# Patient Record
Sex: Female | Born: 1950
Health system: Southern US, Community
[De-identification: ages and names within clinical notes are randomized; demographics above are authoritative.]

## PROBLEM LIST (undated history)

## (undated) DIAGNOSIS — K648 Other hemorrhoids: Secondary | ICD-10-CM

## (undated) DIAGNOSIS — F329 Major depressive disorder, single episode, unspecified: Secondary | ICD-10-CM

## (undated) DIAGNOSIS — G4730 Sleep apnea, unspecified: Secondary | ICD-10-CM

## (undated) DIAGNOSIS — K8020 Calculus of gallbladder without cholecystitis without obstruction: Secondary | ICD-10-CM

## (undated) DIAGNOSIS — F411 Generalized anxiety disorder: Secondary | ICD-10-CM

## (undated) DIAGNOSIS — F3289 Other specified depressive episodes: Secondary | ICD-10-CM

## (undated) DIAGNOSIS — K259 Gastric ulcer, unspecified as acute or chronic, without hemorrhage or perforation: Secondary | ICD-10-CM

## (undated) DIAGNOSIS — M1990 Unspecified osteoarthritis, unspecified site: Secondary | ICD-10-CM

## (undated) DIAGNOSIS — I10 Essential (primary) hypertension: Secondary | ICD-10-CM

## (undated) DIAGNOSIS — N870 Mild cervical dysplasia: Secondary | ICD-10-CM

## (undated) DIAGNOSIS — D649 Anemia, unspecified: Secondary | ICD-10-CM

## (undated) DIAGNOSIS — E785 Hyperlipidemia, unspecified: Secondary | ICD-10-CM

## (undated) DIAGNOSIS — E119 Type 2 diabetes mellitus without complications: Secondary | ICD-10-CM

## (undated) DIAGNOSIS — J439 Emphysema, unspecified: Secondary | ICD-10-CM

## (undated) DIAGNOSIS — D519 Vitamin B12 deficiency anemia, unspecified: Secondary | ICD-10-CM

## (undated) HISTORY — DX: Other hemorrhoids: K64.8

## (undated) HISTORY — DX: Unspecified osteoarthritis, unspecified site: M19.90

## (undated) HISTORY — PX: UPPER GASTROINTESTINAL ENDOSCOPY: SHX188

## (undated) HISTORY — DX: Generalized anxiety disorder: F41.1

## (undated) HISTORY — DX: Vitamin B12 deficiency anemia, unspecified: D51.9

## (undated) HISTORY — DX: Essential (primary) hypertension: I10

## (undated) HISTORY — DX: Mild cervical dysplasia: N87.0

## (undated) HISTORY — DX: Type 2 diabetes mellitus without complications: E11.9

## (undated) HISTORY — DX: Calculus of gallbladder without cholecystitis without obstruction: K80.20

## (undated) HISTORY — DX: Other specified depressive episodes: F32.89

## (undated) HISTORY — DX: Emphysema, unspecified: J43.9

## (undated) HISTORY — DX: Major depressive disorder, single episode, unspecified: F32.9

## (undated) HISTORY — DX: Anemia, unspecified: D64.9

## (undated) HISTORY — PX: CERVICAL CERCLAGE: SHX1329

## (undated) HISTORY — PX: HEMORRHOID SURGERY: SHX153

## (undated) HISTORY — PX: COLONOSCOPY: SHX174

## (undated) HISTORY — DX: Sleep apnea, unspecified: G47.30

## (undated) HISTORY — DX: Hyperlipidemia, unspecified: E78.5

## (undated) HISTORY — PX: OTHER SURGICAL HISTORY: SHX169

## (undated) HISTORY — DX: Gastric ulcer, unspecified as acute or chronic, without hemorrhage or perforation: K25.9

---

## 1999-04-25 ENCOUNTER — Other Ambulatory Visit: Admission: RE | Admit: 1999-04-25 | Discharge: 1999-04-25 | Payer: Self-pay | Admitting: Obstetrics and Gynecology

## 1999-09-16 ENCOUNTER — Emergency Department (HOSPITAL_COMMUNITY): Admission: EM | Admit: 1999-09-16 | Discharge: 1999-09-16 | Payer: Self-pay

## 2000-05-28 ENCOUNTER — Other Ambulatory Visit: Admission: RE | Admit: 2000-05-28 | Discharge: 2000-05-28 | Payer: Self-pay | Admitting: Gynecology

## 2000-06-17 ENCOUNTER — Ambulatory Visit (HOSPITAL_COMMUNITY): Admission: RE | Admit: 2000-06-17 | Discharge: 2000-06-17 | Payer: Self-pay | Admitting: Gynecology

## 2000-06-17 ENCOUNTER — Encounter: Payer: Self-pay | Admitting: Gynecology

## 2000-10-10 ENCOUNTER — Ambulatory Visit (HOSPITAL_COMMUNITY): Admission: RE | Admit: 2000-10-10 | Discharge: 2000-10-10 | Payer: Self-pay | Admitting: Orthopaedic Surgery

## 2001-07-26 ENCOUNTER — Encounter: Payer: Self-pay | Admitting: Emergency Medicine

## 2001-07-26 ENCOUNTER — Emergency Department (HOSPITAL_COMMUNITY): Admission: EM | Admit: 2001-07-26 | Discharge: 2001-07-26 | Payer: Self-pay | Admitting: Emergency Medicine

## 2002-02-20 ENCOUNTER — Encounter: Payer: Self-pay | Admitting: Emergency Medicine

## 2002-02-20 ENCOUNTER — Emergency Department (HOSPITAL_COMMUNITY): Admission: EM | Admit: 2002-02-20 | Discharge: 2002-02-20 | Payer: Self-pay | Admitting: Emergency Medicine

## 2002-05-12 ENCOUNTER — Other Ambulatory Visit: Admission: RE | Admit: 2002-05-12 | Discharge: 2002-05-12 | Payer: Self-pay | Admitting: Gynecology

## 2003-08-30 ENCOUNTER — Other Ambulatory Visit: Admission: RE | Admit: 2003-08-30 | Discharge: 2003-08-30 | Payer: Self-pay | Admitting: Gynecology

## 2004-03-29 ENCOUNTER — Ambulatory Visit (HOSPITAL_BASED_OUTPATIENT_CLINIC_OR_DEPARTMENT_OTHER): Admission: RE | Admit: 2004-03-29 | Discharge: 2004-03-29 | Payer: Self-pay | Admitting: Internal Medicine

## 2004-07-18 ENCOUNTER — Ambulatory Visit (HOSPITAL_BASED_OUTPATIENT_CLINIC_OR_DEPARTMENT_OTHER): Admission: RE | Admit: 2004-07-18 | Discharge: 2004-07-18 | Payer: Self-pay | Admitting: Pulmonary Disease

## 2004-12-17 ENCOUNTER — Ambulatory Visit: Payer: Self-pay | Admitting: Internal Medicine

## 2004-12-17 ENCOUNTER — Other Ambulatory Visit: Admission: RE | Admit: 2004-12-17 | Discharge: 2004-12-17 | Payer: Self-pay | Admitting: Gynecology

## 2005-01-16 ENCOUNTER — Ambulatory Visit: Payer: Self-pay | Admitting: Internal Medicine

## 2005-03-04 ENCOUNTER — Ambulatory Visit: Payer: Self-pay | Admitting: Internal Medicine

## 2005-10-30 ENCOUNTER — Ambulatory Visit: Payer: Self-pay | Admitting: Internal Medicine

## 2005-11-01 ENCOUNTER — Ambulatory Visit: Payer: Self-pay | Admitting: Cardiology

## 2005-11-18 ENCOUNTER — Ambulatory Visit: Payer: Self-pay | Admitting: Internal Medicine

## 2006-01-01 ENCOUNTER — Other Ambulatory Visit: Admission: RE | Admit: 2006-01-01 | Discharge: 2006-01-01 | Payer: Self-pay | Admitting: Gynecology

## 2006-01-24 LAB — HM MAMMOGRAPHY

## 2006-04-30 ENCOUNTER — Ambulatory Visit: Payer: Self-pay | Admitting: Internal Medicine

## 2006-06-20 ENCOUNTER — Ambulatory Visit: Payer: Self-pay | Admitting: Internal Medicine

## 2007-06-15 ENCOUNTER — Other Ambulatory Visit: Admission: RE | Admit: 2007-06-15 | Discharge: 2007-06-15 | Payer: Self-pay | Admitting: Gynecology

## 2007-10-01 DIAGNOSIS — E785 Hyperlipidemia, unspecified: Secondary | ICD-10-CM | POA: Insufficient documentation

## 2007-10-01 DIAGNOSIS — F329 Major depressive disorder, single episode, unspecified: Secondary | ICD-10-CM | POA: Insufficient documentation

## 2007-10-01 DIAGNOSIS — G4733 Obstructive sleep apnea (adult) (pediatric): Secondary | ICD-10-CM

## 2007-10-01 DIAGNOSIS — Z9889 Other specified postprocedural states: Secondary | ICD-10-CM

## 2007-10-01 DIAGNOSIS — F411 Generalized anxiety disorder: Secondary | ICD-10-CM | POA: Insufficient documentation

## 2007-10-01 DIAGNOSIS — F419 Anxiety disorder, unspecified: Secondary | ICD-10-CM | POA: Insufficient documentation

## 2007-11-02 ENCOUNTER — Ambulatory Visit: Payer: Self-pay | Admitting: Internal Medicine

## 2007-11-02 DIAGNOSIS — E119 Type 2 diabetes mellitus without complications: Secondary | ICD-10-CM | POA: Insufficient documentation

## 2007-11-03 LAB — CONVERTED CEMR LAB: Hgb A1c MFr Bld: 6.9 % — ABNORMAL HIGH (ref 4.6–6.0)

## 2007-11-06 ENCOUNTER — Ambulatory Visit: Payer: Self-pay | Admitting: Pulmonary Disease

## 2007-11-19 ENCOUNTER — Encounter: Admission: RE | Admit: 2007-11-19 | Discharge: 2007-11-19 | Payer: Self-pay | Admitting: Internal Medicine

## 2007-11-25 ENCOUNTER — Encounter: Payer: Self-pay | Admitting: Internal Medicine

## 2008-07-25 ENCOUNTER — Encounter: Payer: Self-pay | Admitting: Internal Medicine

## 2009-02-07 ENCOUNTER — Encounter: Payer: Self-pay | Admitting: Internal Medicine

## 2009-02-08 ENCOUNTER — Encounter: Admission: RE | Admit: 2009-02-08 | Discharge: 2009-02-08 | Payer: Self-pay | Admitting: Gynecology

## 2009-02-10 ENCOUNTER — Encounter: Payer: Self-pay | Admitting: Internal Medicine

## 2009-02-15 ENCOUNTER — Encounter: Admission: RE | Admit: 2009-02-15 | Discharge: 2009-02-15 | Payer: Self-pay | Admitting: Gynecology

## 2009-03-07 ENCOUNTER — Telehealth: Payer: Self-pay | Admitting: Internal Medicine

## 2009-04-04 ENCOUNTER — Ambulatory Visit: Payer: Self-pay | Admitting: Internal Medicine

## 2009-10-15 ENCOUNTER — Telehealth: Payer: Self-pay | Admitting: Family Medicine

## 2010-01-15 ENCOUNTER — Encounter: Payer: Self-pay | Admitting: Internal Medicine

## 2010-02-15 ENCOUNTER — Encounter: Admission: RE | Admit: 2010-02-15 | Discharge: 2010-02-15 | Payer: Self-pay | Admitting: Orthopaedic Surgery

## 2010-07-12 ENCOUNTER — Ambulatory Visit: Payer: Self-pay | Admitting: Internal Medicine

## 2010-07-12 DIAGNOSIS — J984 Other disorders of lung: Secondary | ICD-10-CM

## 2010-07-12 DIAGNOSIS — R918 Other nonspecific abnormal finding of lung field: Secondary | ICD-10-CM | POA: Insufficient documentation

## 2010-07-12 LAB — CONVERTED CEMR LAB
ALT: 27 units/L (ref 0–35)
AST: 32 units/L (ref 0–37)
Calcium: 9.6 mg/dL (ref 8.4–10.5)
Cholesterol: 152 mg/dL (ref 0–200)
GFR calc non Af Amer: 79.93 mL/min (ref >60)
HDL: 46.6 mg/dL (ref >39.00)
Hgb A1c MFr Bld: 7.1 % — ABNORMAL HIGH (ref 4.6–6.5)
Potassium: 3.9 meq/L (ref 3.5–5.1)
Sodium: 139 meq/L (ref 135–145)
Total Protein: 7.2 g/dL (ref 6.0–8.3)
Triglycerides: 160 mg/dL — ABNORMAL HIGH (ref 0.0–149.0)
VLDL: 32 mg/dL (ref 0.0–40.0)

## 2010-07-17 ENCOUNTER — Ambulatory Visit: Payer: Self-pay | Admitting: Internal Medicine

## 2010-07-18 ENCOUNTER — Telehealth: Payer: Self-pay | Admitting: Internal Medicine

## 2010-10-19 ENCOUNTER — Encounter: Admission: RE | Admit: 2010-10-19 | Discharge: 2010-10-19 | Payer: Self-pay | Admitting: Gynecology

## 2010-10-31 ENCOUNTER — Encounter: Admission: RE | Admit: 2010-10-31 | Discharge: 2010-10-31 | Payer: Self-pay | Admitting: Gynecology

## 2010-11-19 ENCOUNTER — Encounter: Payer: Self-pay | Admitting: Internal Medicine

## 2011-01-20 ENCOUNTER — Encounter: Payer: Self-pay | Admitting: Gynecology

## 2011-01-21 ENCOUNTER — Encounter: Payer: Self-pay | Admitting: Gynecology

## 2011-01-29 NOTE — Progress Notes (Signed)
  Phone Note Other Incoming   Summary of Call: pt requesting lab results Initial call taken by: Ami Bullins CMA,  July 18, 2010 2:37 PM  Follow-up for Phone Call        Should have received copy of office note that included labs. Results were fine except A1C 7.1% - diet managment should do. CT chest 7/19 with emphysema, stable pulmonary nodules, fatty infiltration of the liver.  Follow-up by: Jacques Navy MD,  July 18, 2010 3:01 PM  Additional Follow-up for Phone Call Additional follow up Details #1::        tried to call pt. AM is full could not leave a message. Will try back later Additional Follow-up by: Ami Bullins CMA,  July 19, 2010 9:45 AM    Additional Follow-up for Phone Call Additional follow up Details #2::    letter was mailed to pt with these lab results Follow-up by: Ami Bullins CMA,  July 23, 2010 9:12 AM

## 2011-01-29 NOTE — Letter (Signed)
Summary: Blima Ledger, OD  Blima Ledger, OD   Imported By: Lennie Odor 11/30/2010 16:22:27  _____________________________________________________________________  External Attachment:    Type:   Image     Comment:   External Document

## 2011-01-29 NOTE — Assessment & Plan Note (Signed)
Summary: needs meds, req ov/cd   Vital Signs:  Patient profile:   60 year old female Height:      67 inches Weight:      242 pounds BMI:     38.04 O2 Sat:      97 % on Room air Temp:     98.4 degrees F oral Pulse rate:   73 / minute BP sitting:   138 / 80  (left arm) Cuff size:   large  Vitals Entered By: Meredith [name] CMA (July 12, 2010 4:39 PM)  O2 Flow:  Room air  Vision Screening:      Vision Comments: Normal eye exam June 2010   Primary Care Provider:  Briawna [name]   History of Present Illness: Patient presents for medical follow-up. She was last seen April '10. She is feeling well. She continues to smoke. She continues to gain weight. She is doing well otherwise.   Preventive Screening-Counseling & Management  Alcohol-Tobacco     Smoking Status: current     Smoking Cessation Counseling: yes     Smoke Cessation Stage: precontemplative     Packs/Day: 0.75  Current Medications (verified): 1)  Vytorin 10-40 Mg  Tabs (Ezetimibe-Simvastatin) .... Once Daily 2)  Zantac  Tabs (Ranitidine Hcl) .... As Needed 3)  Alprazolam 0.5 Mg  Tabs (Alprazolam) .... One Tab Po Every  6 Hrs  As Needed 4)  Cymbalta 20 Mg Cpep (Duloxetine Hcl) .... Take 1 Tablet By Mouth Once A Day 5)  Vitamin D 2000 Unit Tabs (Cholecalciferol) .... Take 1 Tablet By Mouth Three Times A Day 6)  Fish Oil 1200 Mg Caps (Omega-3 Fatty Acids) .... Take 2 Tablet By Mouth Once A Day 7)  Aspirin 81 Mg  Tabs (Aspirin) .... Take 1 Tablet By Mouth Once A Day 8)  Calcium 600-D 600-400 Mg-Unit Tabs (Calcium Carbonate-Vitamin D) .... Take 1 Tablet By Mouth Two Times A Day  Allergies (verified): No Known Drug Allergies  Past History:  Past Medical History: Last updated: Nov 04, 2007 SLEEP APNEA (ICD-780.57) HYPERLIPIDEMIA (ICD-272.4) DEPRESSION (ICD-311) ANXIETY (ICD-300.00) Diabetes mellitus, type II  Past Surgical History: Last updated: 10/01/2007 Hemorrhoidectomy CAESAREAN SECTION, HX OF  (ICD-V39.01) ARTHROSCOPY, KNEE, HX OF (ICD-V45.89)  Family History: Last updated: November 04, 2007 father-deceased, lung cancer, Lipids, MI at 40, DM mother- deceased complications of MS Neg for colon, breast or prostate cancer.  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 BA -psych from [employer]. married 14 years-divorced. 3 daughters- twins 79, 36 work: Artist substance abuse counselor  no h/o physical or sexual abuse  Risk Factors: Smoking Status: current (07/12/2010) Packs/Day: 0.75 (07/12/2010)  Social History: Packs/Day:  0.75  Physical Exam  General:  Obese white female in no distress Head:  Normocephalic and atraumatic without obvious abnormalities. No apparent alopecia or balding. Eyes:  No corneal or conjunctival inflammation noted. EOMI. Perrla. Funduscopic exam benign, without hemorrhages, exudates or papilledema. Vision grossly normal. Ears:  External ear exam shows no significant lesions or deformities.  Otoscopic examination reveals clear canals, tympanic membranes are intact bilaterally without bulging, retraction, inflammation or discharge. Hearing is grossly normal bilaterally. Nose:  no external deformity and no external erythema.   Mouth:  Oral mucosa and oropharynx without lesions or exudates.  Teeth in good repair. Neck:  supple, full ROM, no thyromegaly, and no carotid bruits.   Chest Wall:  no deformities and no tenderness.   Breasts:  deferred to gyn Lungs:  Normal respiratory effort,  chest expands symmetrically. Lungs are clear to auscultation, no crackles or wheezes. Heart:  Normal rate and regular rhythm. S1 and S2 normal without gallop, murmur, click, rub or other extra sounds. Abdomen:  soft, non-tender, normal bowel sounds, no distention, no masses, no guarding, and no hepatomegaly.   Genitalia:  deferred to gyn Msk:  normal ROM, no joint tenderness, no joint swelling, no joint  warmth, no redness over joints, and no joint instability.   Pulses:  2+ radial, DP and PT pulses Extremities:  No clubbing, cyanosis, edema, or deformity noted with normal full range of motion of all joints.   Neurologic:  alert & oriented X3, cranial nerves II-XII intact, gait normal, and DTRs symmetrical and normal.   Skin:  patch of hyperproliferative skin medial aspect right ankle Cervical Nodes:  no anterior cervical adenopathy and no posterior cervical adenopathy.   Psych:  Oriented X3, memory intact for recent and remote, normally interactive, and good eye contact.     Impression & Recommendations:  Problem # 1:  PULMONARY NODULE (ICD-518.89) Patient is a smoker. She had a CT chest '06 that revealed several pleural based nodules. She is asymptomatic.  Plan - f/u CT chest  Orders: Radiology Referral (Radiology)  Problem # 2:  DIABETES MELLITUS, TYPE II (ICD-250.00) Diet controlled diabetic  Plan - A1C with recommendations to follow  Her updated medication list for this problem includes:    Aspirin 81 Mg Tabs (Aspirin) .Marland Kitchen... Take 1 tablet by mouth once a day  Orders: TLB-BMP (Basic Metabolic Panel-BMET) (80048-METABOL) TLB-A1C / Hgb A1C (Glycohemoglobin) (83036-A1C)  Problem # 3:  SLEEP APNEA (ICD-780.57) Patient continues to use CPAP mask but feels she may need adjustment and new mask  Plan - she is to call for appt with Dr. Shelle [name]  Problem # 4:  HYPERLIPIDEMIA (ICD-272.4) Patient has been controlled on vytorin  Plan - follow-up lab           Rx renewed  Her updated medication list for this problem includes:    Vytorin 10-40 Mg Tabs (Ezetimibe-simvastatin) ..... Once daily  Orders: TLB-Lipid Panel (80061-LIPID) TLB-Hepatic/Liver Function Pnl (80076-HEPATIC)  Problem # 5:  DEPRESSION (ICD-311) Patient still sees Meredith [name] and is doing well on cymbalta. She rarely uses  alprazolam.  Her updated medication list for this problem includes:    Alprazolam 0.5 Mg  Tabs (Alprazolam) ..... One tab po every  6 hrs  as needed    Cymbalta 20 Mg Cpep (Duloxetine hcl) .Marland Kitchen... Take 1 tablet by mouth once a day  Problem # 6:  Preventive Health Care (ICD-V70.0) Unremarkable interval history. Exam notable only for obesity and otherwise normal. Labs are pending. Last colonoscopy '06. She is current with mammography.  In summary - pleasant woman who appears medically stable. She is strongly advised to stop smoking!! 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 She will return as needed or 1 year.   Complete Medication List: 1)  Vytorin 10-40 Mg Tabs (Ezetimibe-simvastatin) .... Once daily 2)  Zantac Tabs (ranitidine Hcl)  .... As needed 3)  Alprazolam 0.5 Mg Tabs (Alprazolam) .... One tab po every  6 hrs  as needed 4)  Cymbalta 20 Mg Cpep (Duloxetine hcl) .... Take 1 tablet by mouth once a day 5)  Vitamin D 2000 Unit Tabs (Cholecalciferol) .... Take 1 tablet by mouth three times a day 6)  Fish Oil 1200 Mg Caps (Omega-3 fatty  acids) .... Take 2 tablet by mouth once a day 7)  Aspirin 81 Mg Tabs (Aspirin) .... Take 1 tablet by mouth once a day 8)  Calcium 600-d 600-400 Mg-unit Tabs (Calcium carbonate-vitamin d) .... Take 1 tablet by mouth two times a day  Patient: Meredith [name] Note: All result statuses are Final unless otherwise noted.  Tests: (1) Lipid Panel (LIPID)   Cholesterol               152 mg/dL                   4-132     ATP III Classification            Desirable:  < 200 mg/dL                    Borderline High:  200 - 239 mg/dL               High:  > = 240 mg/dL   Triglycerides        [H]  160.0 mg/dL                 4.4-010.2     Normal:  <150 mg/dL     Borderline High:  725 - 199 mg/dL   HDL                       36.64 mg/dL                 >40.34   VLDL Cholesterol          32.0 mg/dL                  7.4-25.9   LDL Cholesterol           73 mg/dL                    5-63  CHO/HDL  Ratio:  CHD Risk                             3                    Men          Women     1/2 Average Risk     3.4          3.3     Average Risk          5.0          4.4     2X Average Risk          9.6          7.1     3X Average Risk          15.0          11.0                           Tests: (2) Hepatic/Liver Function Panel (HEPATIC)   Total Bilirubin      [L]  0.2 mg/dL                   8.7-5.6   Direct Bilirubin          0.1 mg/dL  0.0-0.3   Alkaline Phosphatase      62 U/L                      39-117   AST                       32 U/L                      0-37   ALT                       27 U/L                      0-35   Total Protein             7.2 g/dL                    6.0-1.0   Albumin                   3.8 g/dL                    9.3-2.3  Tests: (3) BMP (METABOL)   Sodium                    139 mEq/L                   135-145   Potassium                 3.9 mEq/L                   3.5-5.1   Chloride                  107 mEq/L                   96-112   Carbon Dioxide            30 mEq/L                    19-32   Glucose              [H]  106 mg/dL                   55-73   BUN                       14 mg/dL                    2-20   Creatinine                0.8 mg/dL                   2.5-4.2   Calcium                   9.6 mg/dL                   7.0-62.3   GFR                       79.93 mL/min                >60  Tests: (4) Hemoglobin A1C (A1C)   Hemoglobin A1C       [  H]  7.1 %                       4.6-6.5     Glycemic Control Guidelines for People with Diabetes:     Non Diabetic:  <6%     Goal of Therapy: <7%     Additional Action Suggested:  >8%  Prescriptions: VYTORIN 10-40 MG  TABS (EZETIMIBE-SIMVASTATIN) once daily  #30 Tablet x 12   Entered and Authorized by:   Jacques Navy MD   Signed by:   Jacques Navy MD on 07/12/2010   Method used:   Electronically to        [facility] (retail)       [address]       [place], [place]  [address]       Ph: [phone]       Fax: [phone]   RxID:   [id number] VYTORIN 10-40 MG  TABS (EZETIMIBE-SIMVASTATIN) once daily  #30 Tablet x 0   Entered and Authorized by:   Jacques Navy MD   Signed by:   Jacques Navy MD on 07/12/2010   Method used:   Electronically to        [facility] (retail)       [address]       [place], [place]  [address]       Ph: [phone]       Fax: [phone]   RxID:   [id number]    Immunization History:  Influenza Immunization History:    Influenza:  historical (10/26/2009)

## 2011-03-18 ENCOUNTER — Encounter: Payer: Self-pay | Admitting: Internal Medicine

## 2011-03-18 ENCOUNTER — Ambulatory Visit (INDEPENDENT_AMBULATORY_CARE_PROVIDER_SITE_OTHER): Payer: BC Managed Care – PPO | Admitting: Internal Medicine

## 2011-03-18 DIAGNOSIS — E119 Type 2 diabetes mellitus without complications: Secondary | ICD-10-CM

## 2011-03-18 DIAGNOSIS — E669 Obesity, unspecified: Secondary | ICD-10-CM

## 2011-03-25 ENCOUNTER — Encounter: Payer: Self-pay | Admitting: Internal Medicine

## 2011-03-28 ENCOUNTER — Encounter: Payer: Self-pay | Admitting: Internal Medicine

## 2011-03-28 NOTE — Assessment & Plan Note (Signed)
Summary: discuss labs that were faxed to men/last ov july 2011/#/cd   Vital Signs:  Patient profile:   60 year old female Height:      65.5 inches Weight:      241 pounds BMI:     39.64 O2 Sat:      96 % on Room air Temp:     97.7 degrees F oral Pulse rate:   71 / minute BP sitting:   138 / 84  (left arm) Cuff size:   regular  Vitals Entered By: Bill Salinas CMA (March 18, 2011 11:27 AM)  O2 Flow:  Room air  Primary Care Provider:  Larah Kuntzman   History of Present Illness: Patient with diabetes that has been better controlled in the past with A1C 6.9, 6.9, 7.1. At her gyn office A1C 9.0%!! She does admit to gaining weight as she quit smoking. She is here to address the issues of diabetes out of control and weight management.  Current Medications (verified): 1)  Vytorin 10-40 Mg  Tabs (Ezetimibe-Simvastatin) .... Once Daily 2)  Zantac  Tabs (Ranitidine Hcl) .... As Needed 3)  Alprazolam 0.5 Mg  Tabs (Alprazolam) .... One Tab Po Every  6 Hrs  As Needed 4)  Cymbalta 20 Mg Cpep (Duloxetine Hcl) .... Take 1 Tablet By Mouth Once A Day 5)  Vitamin D 2000 Unit Tabs (Cholecalciferol) .... Take 1 Tablet By Mouth Three Times A Day 6)  Fish Oil 1200 Mg Caps (Omega-3 Fatty Acids) .... Take 2 Tablet By Mouth Once A Day 7)  Aspirin 81 Mg  Tabs (Aspirin) .... Take 1 Tablet By Mouth Once A Day 8)  Calcium 600-D 600-400 Mg-Unit Tabs (Calcium Carbonate-Vitamin D) .... Take 1 Tablet By Mouth Two Times A Day  Allergies (verified): No Known Drug Allergies  Past History:  Past Medical History: Last updated: 11/02/2007 SLEEP APNEA (ICD-780.57) HYPERLIPIDEMIA (ICD-272.4) DEPRESSION (ICD-311) ANXIETY (ICD-300.00) Diabetes mellitus, type II  Past Surgical History: Last updated: 10/01/2007 Hemorrhoidectomy CAESAREAN SECTION, HX OF (ICD-V39.01) ARTHROSCOPY, KNEE, HX OF (ICD-V45.89) PMH-FH-SH reviewed-no changes except otherwise noted, PMH-FH-SH reviewed for relevance  Review of Systems   The patient complains of weight gain.  The patient denies anorexia, fever, decreased hearing, chest pain, dyspnea on exertion, and abdominal pain.    Physical Exam  General:  overweeight white woman in no distress Head:  normocephalic and atraumatic.   Eyes:  vision grossly intact, pupils equal, and pupils round.   Neck:  supple.   Lungs:  normal respiratory effort.   Heart:  normal rate and regular rhythm.   Abdomen:  obese Neurologic:  alert & oriented X3, cranial nerves II-XII intact, and gait normal.   Skin:  turgor normal and color normal.   Psych:  Oriented X3, memory intact for recent and remote, normally interactive, and good eye contact.     Impression & Recommendations:  Problem # 1:  DIABETES MELLITUS, TYPE II (ICD-250.00) Patient presents for out of control blood sugar - A1C 9% at gyn office. Reviewed chart:                                 11/08         2/10     7/11      3/12      A1C                     6.9  6.9       7.1         9.0%  Plan - metformin 500mg  two times a day (start at once daily x 1 week then two times a day )           A1C q 3 months           Diet - no sugar, low carb  Her updated medication list for this problem includes:    Aspirin 81 Mg Tabs (Aspirin) .Marland Kitchen... Take 1 tablet by mouth once a day    Metformin Hcl 500 Mg Tabs (Metformin hcl) .Marland Kitchen... 1 by mouth two times a day  (greater than 50% visit spent on education and counselling.   Problem # 2:  OBESITY, CLASS II (ICD-278.00) discussed weight management - smart food choices; PORTION SIZE CONTROL; exercise Target - 180lbs; goal to loose 1lb per month.   Complete Medication List: 1)  Vytorin 10-40 Mg Tabs (Ezetimibe-simvastatin) .... Once daily 2)  Zantac Tabs (ranitidine Hcl)  .... As needed 3)  Alprazolam 0.5 Mg Tabs (Alprazolam) .... One tab po every  6 hrs  as needed 4)  Citalopram Hydrobromide 40 Mg Tabs (Citalopram hydrobromide) .Marland Kitchen.. 1 by mouth once daily for anxiety and  depression 5)  Vitamin D 2000 Unit Tabs (Cholecalciferol) .... Take 1 tablet by mouth three times a day 6)  Fish Oil 1200 Mg Caps (Omega-3 fatty acids) .... Take 2 tablet by mouth once a day 7)  Aspirin 81 Mg Tabs (Aspirin) .... Take 1 tablet by mouth once a day 8)  Calcium 600-d 600-400 Mg-unit Tabs (Calcium carbonate-vitamin d) .... Take 1 tablet by mouth two times a day 9)  Metformin Hcl 500 Mg Tabs (Metformin hcl) .Marland Kitchen.. 1 by mouth two times a day  Patient Instructions: 1)  Diabetes - OUT OF CONTROL with 9% A1C. Plan: no sugar, low carb; metform 500mg  once a day for 1 week then two times a day. Repeat A1C every 3 months until controlled. 2)  Weight management - smart food choices; PORTION SIZE CONTROL; regular exercise. Target weight 180lbs; Goal - to loose 1-2 lbs per month.  3)  Vitamin D deficiency - normal replacement for vitamin D deficiency is 50,000 international units weekly for 12 weeks then maintenance dosing of 1000 international units daily. If Vit D does not stay in normal range an endocrine work-up is indicated. Plan - Vit D level with next lab.  Prescriptions: METFORMIN HCL 500 MG TABS (METFORMIN HCL) 1 by mouth two times a day  #60 x 12   Entered and Authorized by:   Jacques Navy MD   Signed by:   Jacques Navy MD on 03/18/2011   Method used:   Electronically to        [facility] (retail)       [address]       [place], [place]  [address]       Ph: [phone]       Fax: [phone]   RxID:   [id number]    Orders Added: 1)  Est. Patient Level IV [24401]

## 2011-04-29 ENCOUNTER — Other Ambulatory Visit: Payer: Self-pay | Admitting: Gynecology

## 2011-04-29 DIAGNOSIS — Z09 Encounter for follow-up examination after completed treatment for conditions other than malignant neoplasm: Secondary | ICD-10-CM

## 2011-05-08 ENCOUNTER — Ambulatory Visit
Admission: RE | Admit: 2011-05-08 | Discharge: 2011-05-08 | Disposition: A | Discharge status: Home / Self Care | Payer: BC Managed Care – PPO | Source: Ambulatory Visit | Attending: Gynecology | Admitting: Gynecology

## 2011-05-08 ENCOUNTER — Other Ambulatory Visit: Payer: Self-pay | Admitting: Gynecology

## 2011-05-08 DIAGNOSIS — Z09 Encounter for follow-up examination after completed treatment for conditions other than malignant neoplasm: Secondary | ICD-10-CM

## 2011-05-14 NOTE — Assessment & Plan Note (Signed)
[facility]                             PULMONARY OFFICE NOTE   NAME:[name], [name]                          MRN:          [id number]  DATE:11/06/2007                            DOB:          07/16/1951    HISTORY OF PRESENT ILLNESS:  Patient is a very pleasant 60 year old  white female whom I have been asked to see for obstructive sleep apnea.  I initially saw the patient back in 2005 where she was diagnosed with  severe sleep apnea with an apnea hypopnea index of 51 events per hour,  and O2 desaturation as low as 78%.  She subsequently underwent a formal  titration where she was found to have a pressure need of approximately 9  cm, but she also had large numbers of leg jerks with significant arousal  and awakening.  The thought at that time was to treat her sleep apnea  aggressively and see if she continued to have alertness issues during  the day.  If she did, then would give consideration to treatment of her  leg jerks.  The patient has been on CPAP since 2005, and has been fairly  compliant with it.  Unfortunately, she did not follow up with me at the  55-month mark, and has not done any maintenance on her CPAP machine, nor  any mask changes since 2005.  The patient typically goes to bed between  12 and 1, and gets up between 6:30 and 8 to start her day.  She is not  rested upon arising.  The patient works as an Programme researcher, broadcasting/film/video, and  states she does have some daytime sleepiness during interviews, but no  frank dosing.  She will also fall asleep easily with reading or watching  TV in the evening.  She has no difficulties with driving.  She denies  any symptoms consistent with restless leg syndrome, and has had no  kicking to her knowledge during the night.  She does state that her  weight is up 60 pounds over the last 1 to 2 years.   PAST MEDICAL HISTORY:  Significant for:  1. Asthma.  2. Diabetes.  3. Dyslipidemia.  4. History of sleep apnea,  as stated above.   CURRENT MEDICATIONS:  Include:  1. Vytorin 10/40 one daily.  2. Zantac p.r.n.  3. Alprazolam 0.5 mg q.6 hours p.r.n.  4. Fluconazole 100 mg p.r.n.   PATIENT HAS NO KNOWN DRUG ALLERGIES.   SOCIAL HISTORY:  She is divorced.  She has a history of smoking for 17  years.  She has quit before, but she is currently smoking 1 pack of  cigarettes per day.   FAMILY HISTORY:  Remarkable for her father having heart disease along  with lung cancer.   REVIEW OF SYSTEMS:  As per history of present illness.  Also, see  patient intake form documented in the chart.   PHYSICAL EXAMINATION:  GENERAL:  She is an obese female in no acute  distress.  Blood pressure is 138/96.  Pulse 79.  Temperature is 98.6.  She is 5  feet 5-1/2 inches tall, and weighs 245 pounds.  Her O2 saturation on  room air is 96%.  HEENT:  Pupils equal, round, and reactive to light and accommodation.  Extraocular muscles are intact.  Nares are patent without discharge.  Oropharynx does show elongation of soft palate and uvula, and sidewall  narrowing.  NECK:  Supple without JVD or lymphadenopathy.  No palpable thyromegaly.  CHEST:  Totally clear.  CARDIAC EXAM:  Reveals regular rate and rhythm.  No murmurs, rubs, or  gallops.  ABDOMEN:  Soft and nontender with good bowel sounds.  GENITAL EXAM:  Not done and not indicated.  RECTAL EXAM:  Not done and not indicated.  BREAST EXAM:  Not done and not indicated.  LOWER EXTREMITIES:  Showed no significant edema.  Pulses were intact  distally.  NEUROLOGIC:  She was alert and oriented, and has no observable motor  defects.   IMPRESSION:  History of severe obstructive sleep apnea.  The patient has  been compliant with the use of her machine, however, has not kept up  with her mask changes, machine maintenance, and has now gained  approximately 60 pounds, which makes it very likely that her pressure  needs to be much higher.  Patient also had a lot of leg jerks  during the  night on her last sleep study, but denies any history consistent with  restless leg syndrome currently.  At this point in time, I think we need  to optimize her pressure, and also get her equipment up to date.  She is  agreeable with this approach.   PLAN:  1. I have asked the patient to work aggressively on weight loss.  2. We will obtain an auto titrate device for the next 2 weeks with      download.  I will call her with the results, and also discuss      followup.  3. The patient will need a new CPAP mask, as well as filters and      hoses.     Barbaraann Share, MD,FCCP  Electronically Signed    [facility]  DD: 11/06/2007  DT: 11/06/2007  Job #: [id number]   cc:   Meredith Gess. Norins, MD

## 2011-05-17 NOTE — Procedures (Signed)
Meredith [name], Meredith [name]                 ACCOUNT NO.:  [id number]   MEDICAL RECORD NO.:  [id number]          PATIENT TYPE:  OUT   LOCATION:  SLEEP CENTER                 FACILITY:  [facility]   PHYSICIAN:  Marcelyn Bruins, M.D. [facility] DATE OF BIRTH:  [birth year]   DATE OF ADMISSION:  07/18/2004  DATE OF DISCHARGE:  07/18/2004                              NOCTURNAL POLYSOMNOGRAM   REFERRING PHYSICIAN:  Dr. Marcelyn Bruins   INDICATION FOR STUDY/HISTORY:  Hypersomnia with sleep apnea.  The patient is  undergoing CPAP titration for pressure optimization.  Epworth sleepiness  score is 12.   SLEEP ARCHITECTURE:  Total sleep time was 280 minutes with decreased slow  wave sleep and REM.  Sleep onset latency was mildly prolonged, with a  prolonged REM latency as well.   IMPRESSION/RECOMMENDATION:  1. Good control of previously diagnosed obstructive sleep apnea with 9 cm of     CPAP.  The patient used her small Resmed Swift mask from home during the     study.  2. No cardiac arrhythmias.  3. Large numbers of leg jerks with significant sleep disruption.  Clinical     correlation suggested.                                   ______________________________                                Marcelyn Bruins, M.D. [facility]     [facility]  D:  07/23/2004 10:00:06  T:  07/23/2004 12:44:33  Job:  [id number]

## 2011-06-17 ENCOUNTER — Other Ambulatory Visit: Payer: Self-pay | Admitting: Internal Medicine

## 2011-06-17 ENCOUNTER — Other Ambulatory Visit (INDEPENDENT_AMBULATORY_CARE_PROVIDER_SITE_OTHER): Payer: BC Managed Care – PPO

## 2011-06-17 DIAGNOSIS — T887XXA Unspecified adverse effect of drug or medicament, initial encounter: Secondary | ICD-10-CM

## 2011-06-17 DIAGNOSIS — Z79899 Other long term (current) drug therapy: Secondary | ICD-10-CM

## 2011-06-17 DIAGNOSIS — E119 Type 2 diabetes mellitus without complications: Secondary | ICD-10-CM

## 2011-06-17 DIAGNOSIS — E669 Obesity, unspecified: Secondary | ICD-10-CM

## 2011-06-17 DIAGNOSIS — E559 Vitamin D deficiency, unspecified: Secondary | ICD-10-CM

## 2011-06-17 LAB — HEMOGLOBIN A1C: Hgb A1c MFr Bld: 6.8 % — ABNORMAL HIGH (ref 4.6–6.5)

## 2011-06-18 ENCOUNTER — Encounter: Payer: Self-pay | Admitting: Internal Medicine

## 2011-06-19 ENCOUNTER — Ambulatory Visit (INDEPENDENT_AMBULATORY_CARE_PROVIDER_SITE_OTHER): Payer: BC Managed Care – PPO | Admitting: Internal Medicine

## 2011-06-19 VITALS — BP 138/78 | HR 61 | Temp 97.9°F | Wt 226.0 lb

## 2011-06-19 DIAGNOSIS — E559 Vitamin D deficiency, unspecified: Secondary | ICD-10-CM

## 2011-06-19 DIAGNOSIS — E119 Type 2 diabetes mellitus without complications: Secondary | ICD-10-CM

## 2011-06-19 DIAGNOSIS — E669 Obesity, unspecified: Secondary | ICD-10-CM

## 2011-06-19 MED ORDER — ALPRAZOLAM 0.5 MG PO TABS: 0.5000 mg | ORAL_TABLET | Freq: Four times a day (QID) | ORAL | Status: DC | PRN

## 2011-06-19 NOTE — Patient Instructions (Signed)
Diabetes -  Lab Results  Component Value Date   HGBA1C 6.8* 06/17/2011   HGBA1C 7.1* 07/12/2010   HGBA1C 6.9* 11/02/2007   Lab Results  Component Value Date   LDLCALC 73 07/12/2010   CREATININE 0.8 07/12/2010   In addition to our labs there was the A1C from Dr. Nicholas Lose in April '12 of 9.0%. You are now back to within goal for treatment of diabetes - an A1C of 7% or less. Plan is to continue the metformin, diet and exercise. Recommend repeat A1C in 6 months.   Vitamin D deficiency - laab with Vit D of 60, very robust with normal range being 32+. IOM recommendation for maintenance is to take [phone] [phone] daily of vitamin D. We can recheck in 6 months and if the Vit D drops to below normal you will need a endocrinology consult as to whether there is any underlying reason for not maintaining a normal vit D level.   Smoking Cessation - GOOD JOB!!!!!!!!

## 2011-06-20 DIAGNOSIS — E559 Vitamin D deficiency, unspecified: Secondary | ICD-10-CM | POA: Insufficient documentation

## 2011-06-20 NOTE — Progress Notes (Signed)
  Subjective:    Patient ID: Meredith [name], female    DOB: 07-17-1951, 60 y.o.   MRN: [id number]  HPI Meredith [name] presents for follow-up of Vit D deficiency and diabetes. She has been feeling well. She has lost 20 lbs!! She has been adherent to medical regimen and diet.  Past Medical History  Diagnosis Date  . Unspecified sleep apnea   . Other and unspecified hyperlipidemia   . Depressive disorder, not elsewhere classified   . Anxiety state, unspecified    Past Surgical History  Procedure Date  . Hemorrhoid surgery   . Cesarean section   . Knee arthroscopy    Family History  Problem Relation Age of Onset  . Heart attack Father 24  . Diabetes Father   . Cancer Father     lung   History   Social History  . Marital Status: Divorced    Spouse Name: N/A    Number of Children: N/A  . Years of Education: N/A   Occupational History  . Not on file.   Social History Main Topics  . Smoking status: Not on file  . Smokeless tobacco: Not on file  . Alcohol Use:   . Drug Use:   . Sexually Active:    Other Topics Concern  . 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   HENT: Negative.   Eyes: Negative.   Respiratory: Negative.   Cardiovascular: Negative.   Gastrointestinal: Negative.   Musculoskeletal: Negative.   Neurological: Negative.        Objective:   Physical Exam Vitals noted Gen'l - overweight white woman in nod distress HEENT - normal Pulmonary - normal respirations Cor- RRR       Assessment & Plan:

## 2011-06-20 NOTE — Assessment & Plan Note (Signed)
She has lost 20 lbs in 3 months!   Plan - cointinued weight loss at a rate of 1-2 lbs per month.

## 2011-06-20 NOTE — Assessment & Plan Note (Signed)
Lab Results  Component Value Date   HGBA1C 6.8* 06/17/2011   HGBA1C 7.1* 07/12/2010   HGBA1C 6.9* 11/02/2007   Lab Results  Component Value Date   LDLCALC 73 07/12/2010   CREATININE 0.8 07/12/2010   Reviewed chart - she reportedly had an A1C = 9% at Dr. Johnn Hai office and was started on metformin in April '12. She has done well.  Plan - continue diet and metformin

## 2011-06-20 NOTE — Assessment & Plan Note (Signed)
Vitamin D level is 60 - robust. She has been taking calw/D = 800 iu daily plus 4000 iu as supplement. Her level is good. Discussed current recommendation IOM: routine replacement for insufficiency or deficiency than maintenance.   Plan - continue Vit D at 800 iu daily           Repeat Vit d in 6 months - if the level drops to abnormal value will refer for endocrinology evaluation.

## 2011-07-13 ENCOUNTER — Other Ambulatory Visit: Payer: Self-pay | Admitting: Internal Medicine

## 2011-09-04 ENCOUNTER — Other Ambulatory Visit: Payer: Self-pay | Admitting: Gynecology

## 2011-09-04 DIAGNOSIS — Z1231 Encounter for screening mammogram for malignant neoplasm of breast: Secondary | ICD-10-CM

## 2011-10-23 ENCOUNTER — Ambulatory Visit
Admission: RE | Admit: 2011-10-23 | Discharge: 2011-10-23 | Disposition: A | Discharge status: Home / Self Care | Payer: BC Managed Care – PPO | Source: Ambulatory Visit | Attending: Gynecology | Admitting: Gynecology

## 2011-10-23 DIAGNOSIS — Z1231 Encounter for screening mammogram for malignant neoplasm of breast: Secondary | ICD-10-CM

## 2012-03-18 ENCOUNTER — Other Ambulatory Visit: Payer: Self-pay | Admitting: *Deleted

## 2012-03-18 MED ORDER — EZETIMIBE-SIMVASTATIN 10-40 MG PO TABS: 1.0000 | ORAL_TABLET | Freq: Every day | ORAL | Status: DC

## 2012-03-30 ENCOUNTER — Telehealth: Payer: Self-pay | Admitting: Internal Medicine

## 2012-03-30 MED ORDER — METFORMIN HCL 500 MG PO TABS: 500.0000 mg | ORAL_TABLET | Freq: Two times a day (BID) | ORAL | Status: DC

## 2012-03-30 NOTE — Telephone Encounter (Signed)
Done

## 2012-03-30 NOTE — Telephone Encounter (Signed)
Pt requesting new prescription be sent in for metformin--[facility]--pt ph# [phone]/cell

## 2012-08-05 ENCOUNTER — Other Ambulatory Visit: Payer: Self-pay

## 2012-08-05 ENCOUNTER — Other Ambulatory Visit: Payer: Self-pay | Admitting: *Deleted

## 2012-08-05 MED ORDER — METFORMIN HCL 500 MG PO TABS: 500.0000 mg | ORAL_TABLET | Freq: Two times a day (BID) | ORAL | Status: DC

## 2012-09-04 ENCOUNTER — Other Ambulatory Visit: Payer: Self-pay | Admitting: *Deleted

## 2012-09-04 ENCOUNTER — Other Ambulatory Visit: Payer: Self-pay

## 2012-09-04 MED ORDER — METFORMIN HCL 500 MG PO TABS: 500.0000 mg | ORAL_TABLET | Freq: Two times a day (BID) | ORAL | Status: DC

## 2012-09-16 ENCOUNTER — Ambulatory Visit: Payer: BC Managed Care – PPO | Admitting: Internal Medicine

## 2012-09-23 ENCOUNTER — Other Ambulatory Visit (INDEPENDENT_AMBULATORY_CARE_PROVIDER_SITE_OTHER): Payer: BC Managed Care – PPO

## 2012-09-23 ENCOUNTER — Ambulatory Visit (INDEPENDENT_AMBULATORY_CARE_PROVIDER_SITE_OTHER): Payer: BC Managed Care – PPO | Admitting: Internal Medicine

## 2012-09-23 VITALS — BP 134/80 | HR 72 | Temp 98.4°F | Resp 16 | Wt 230.0 lb

## 2012-09-23 DIAGNOSIS — F329 Major depressive disorder, single episode, unspecified: Secondary | ICD-10-CM

## 2012-09-23 DIAGNOSIS — E559 Vitamin D deficiency, unspecified: Secondary | ICD-10-CM

## 2012-09-23 DIAGNOSIS — E669 Obesity, unspecified: Secondary | ICD-10-CM

## 2012-09-23 DIAGNOSIS — Z2911 Encounter for prophylactic immunotherapy for respiratory syncytial virus (RSV): Secondary | ICD-10-CM

## 2012-09-23 DIAGNOSIS — E785 Hyperlipidemia, unspecified: Secondary | ICD-10-CM

## 2012-09-23 DIAGNOSIS — E119 Type 2 diabetes mellitus without complications: Secondary | ICD-10-CM

## 2012-09-23 DIAGNOSIS — Z23 Encounter for immunization: Secondary | ICD-10-CM

## 2012-09-23 DIAGNOSIS — Z0000 Encounter for general adult medical examination without abnormal findings: Secondary | ICD-10-CM

## 2012-09-23 DIAGNOSIS — J984 Other disorders of lung: Secondary | ICD-10-CM

## 2012-09-23 LAB — LIPID PANEL
Cholesterol: 170 mg/dL (ref 0–200)
HDL: 51 mg/dL (ref >39.00)
LDL Cholesterol: 88 mg/dL (ref 0–99)
Triglycerides: 157 mg/dL — ABNORMAL HIGH (ref 0.0–149.0)
VLDL: 31.4 mg/dL (ref 0.0–40.0)

## 2012-09-23 LAB — COMPREHENSIVE METABOLIC PANEL
AST: 22 U/L (ref 0–37)
Alkaline Phosphatase: 66 U/L (ref 39–117)
BUN: 11 mg/dL (ref 6–23)
Calcium: 9.2 mg/dL (ref 8.4–10.5)
Chloride: 104 mEq/L (ref 96–112)
Creatinine, Ser: 0.6 mg/dL (ref 0.4–1.2)

## 2012-09-23 LAB — HEPATIC FUNCTION PANEL
Alkaline Phosphatase: 66 U/L (ref 39–117)
Bilirubin, Direct: 0 mg/dL (ref 0.0–0.3)
Total Protein: 7.6 g/dL (ref 6.0–8.3)

## 2012-09-23 MED ORDER — CITALOPRAM HYDROBROMIDE 40 MG PO TABS: 40.0000 mg | ORAL_TABLET | Freq: Every day | ORAL | Status: DC

## 2012-09-23 MED ORDER — EZETIMIBE-SIMVASTATIN 10-40 MG PO TABS: 1.0000 | ORAL_TABLET | Freq: Every day | ORAL | Status: DC

## 2012-09-23 MED ORDER — METFORMIN HCL 500 MG PO TABS: 500.0000 mg | ORAL_TABLET | Freq: Two times a day (BID) | ORAL | Status: DC

## 2012-09-23 MED ORDER — ALPRAZOLAM 0.5 MG PO TABS: 0.5000 mg | ORAL_TABLET | Freq: Four times a day (QID) | ORAL | Status: DC | PRN

## 2012-09-23 NOTE — Patient Instructions (Addendum)
Diabetes  - will check A1C today. If you are at goal of 7% or less follow up in 6 months; if >7% follow-up in 3 months.  Vitamin D - take 800-1,000 iu daily for maintenance  Psych - have sent in Celexa 40 mg to resume taking.   Immunizations - Tdap and Shingles vaccine today.   Health maintenance - next colonoscopy in 2016; due for pelvic and PAP; due for mammogram.

## 2012-09-23 NOTE — Progress Notes (Signed)
Subjective:    Patient ID: Meredith [name], female    DOB: August 25, 1951, 61 y.o.   MRN: [id number]  HPI Ms. Anis presents for medical follow-up and routine exam. She was last seen in June '12 at which time she was to reduce Vitamine D to 800 iu daily but she understood me to say to stop all together. In the interval she has been taking her metformin but reports that her CBGs have been running higher than goal. She did have a orthopedic injury with torn ligaments right leg and has been in a walking cast under the care of Dr.Yates. She is overdue to see her gynecologist. She has had a mammogram and is up to date. She is current with colonoscopy. Immunizations - due for Tdap, shingles vaccine.  Past Medical History  Diagnosis Date  . Unspecified sleep apnea   . Other and unspecified hyperlipidemia   . Depressive disorder, not elsewhere classified   . Anxiety state, unspecified    Past Surgical History  Procedure Date  . Hemorrhoid surgery   . Cesarean section   . Knee arthroscopy    Family History  Problem Relation Age of Onset  . Heart attack Father 48  . Diabetes Father   . Cancer Father     lung   History   Social History  . Marital Status: Divorced    Spouse Name: N/A    Number of Children: N/A  . Years of Education: N/A   Occupational History  . Not on file.   Social History Main Topics  . Smoking status: Not on file  . Smokeless tobacco: Not on file  . Alcohol Use:   . Drug Use:   . Sexually Active:    Other Topics Concern  . 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 aspirin 81 MG tablet Take 81 mg by mouth daily.        . citalopram (CELEXA) 40 MG  tablet Take 40 mg by mouth daily.        . Omega-3 Fatty Acids (FISH OIL) 1200 MG CAPS Take 2 capsules by mouth daily.        . Ranitidine HCl (ZANTAC PO) Take by mouth as needed.        Marland Kitchen DISCONTD: ezetimibe-simvastatin (VYTORIN) 10-40 MG per tablet Take 1 tablet by mouth daily.  30 tablet  5  . DISCONTD: metFORMIN (GLUCOPHAGE) 500 MG tablet Take 1 tablet (500 mg total) by mouth 2 (two) times daily with a meal. No further refills without Appointment  60 tablet  3  . Calcium Carbonate-Vitamin D (CALCIUM 600/VITAMIN D) 600-400 MG-UNIT per tablet Take 1 tablet by mouth 2 (two) times daily.        . Cholecalciferol (VITAMIN D) 2000 UNITS tablet Take 2,000 Units by mouth 3 (three) times daily.            Review of Systems Constitutional:  Negative for fever, chills, activity change and unexpected weight change.  HEENT:  Negative for hearing loss, ear pain, congestion, neck stiffness and postnasal drip. Negative for sore throat or swallowing problems. Negative for dental complaints.   Eyes: Negative for vision loss or change in visual acuity.  Respiratory: Negative for chest tightness and wheezing. Negative for DOE.   Cardiovascular: Negative for chest pain. Positive for palpitations. No decreased exercise tolerance Gastrointestinal: No change in bowel habit. No bloating or gas. No reflux or indigestion Genitourinary: Negative for urgency, frequency, flank pain and difficulty urinating.  Musculoskeletal: Negative for myalgias, back pain, arthralgias and gait problem.  Neurological: Positive for dizziness-dysequilibrium with position change, negative for tremors, weakness and headaches.  Hematological: Negative for adenopathy.  Psychiatric/Behavioral: Negative for behavioral problems and dysphoric mood.       Objective:   Physical Exam Filed Vitals:   09/23/12 1422  BP: 134/80  Pulse: 72  Temp: 98.4 F (36.9 C)  Resp: 16   Wt Readings from Last 3 Encounters:  09/23/12 230 lb (104.327  kg)  06/19/11 226 lb (102.513 kg)  03/18/11 241 lb (109.317 kg)   Gen'l: well nourished, well developed white woman in no distress HEENT - [place]/AT, EACs/TMs normal, oropharynx with native dentition in good condition, no buccal or palatal lesions, posterior pharynx clear, mucous membranes moist. C&S clear, PERRLA, fundi - normal Neck - supple, no thyromegaly Nodes- negative submental, cervical, supraclavicular regions Chest - no deformity, no CVAT Lungs - cleat without rales, wheezes. No increased work of breathing Breast - deferred to gyn and mammography Cardiovascular - regular rate and rhythm, quiet precordium, no murmurs, rubs or gallops, 2+ radial, DP and PT pulses Abdomen - BS+ x 4, no HSM, no guarding or rebound or tenderness Pelvic - deferred to gyn Rectal - deferred to gyn Extremities - no clubbing, cyanosis, edema or deformity. Walking cast right leg Neuro - A&O x 3, CN II-XII normal, motor strength normal and equal, DTRs 2+ and symmetrical biceps, radial, and patellar tendons. Cerebellar - no tremor, no rigidity, fluid movement and normal gait. Derm - Head, neck, back, abdomen and extremities without suspicious lesions   Lab Results  Component Value Date   GLUCOSE 134* 09/23/2012   CHOL 170 09/23/2012   TRIG 157.0* 09/23/2012   HDL 51.00 09/23/2012   LDLCALC 88 09/23/2012        ALT 19 09/23/2012   AST 22 09/23/2012        NA 140 09/23/2012   K 4.1 09/23/2012   CL 104 09/23/2012   CREATININE 0.6 09/23/2012   BUN 11 09/23/2012   CO2 28 09/23/2012   TSH 1.89 09/23/2012   HGBA1C 7.3* 09/23/2012        Assessment & Plan:

## 2012-09-25 ENCOUNTER — Encounter: Payer: Self-pay | Admitting: Internal Medicine

## 2012-09-27 ENCOUNTER — Encounter: Payer: Self-pay | Admitting: Internal Medicine

## 2012-09-27 DIAGNOSIS — Z0000 Encounter for general adult medical examination without abnormal findings: Secondary | ICD-10-CM | POA: Insufficient documentation

## 2012-09-27 NOTE — Assessment & Plan Note (Signed)
Wt Readings from Last 3 Encounters:  09/23/12 230 lb (104.327 kg)  06/19/11 226 lb (102.513 kg)  03/18/11 241 lb (109.317 kg)   Last BMI 37.68 (normal 19-25)  Plan -  weight management: smart food choices, PORTION SIZE CONTROL, aerobic exercise (e.g. Water exercise)  Target weight 175; goal - to loose 1-2 lbs/month (5 year project)

## 2012-09-27 NOTE — Assessment & Plan Note (Signed)
Last study July '11: IMPRESSION:  1. Mild centrilobular emphysema without other explanation chronic  cough.  2. Age advanced coronary artery atherosclerosis.  3. Stable scattered lung nodules, likely subpleural lymph nodes.  Stability since 2006 is consistent with a benign etiology.  4. Fatty infiltration of the liver.

## 2012-09-27 NOTE — Assessment & Plan Note (Signed)
Excellent control with LDL better than goal of 100 or less  Plan - continue present medication and diet

## 2012-09-27 NOTE — Assessment & Plan Note (Signed)
Interval - orthopedic injury right leg w/o need for surgery, otherwise OK. Limited physical exam - OK. Current with colorectal and breast cancer screening. Due for gyn exam - she will schedule. Immunizations brought up to date but will need to return in 4 weeks for pneumonia vaccine.   In summary - a nice woman who is doing OK. She will continue her present medical regimen and return in 6 months for follow-up A1C.

## 2012-09-27 NOTE — Assessment & Plan Note (Signed)
Stable. Continues to take medications.

## 2012-09-27 NOTE — Assessment & Plan Note (Signed)
Last Vit D level June '12 - 60, normal  Plan - routine maintenance Vit D = 800-1,000 iu daily

## 2012-09-27 NOTE — Assessment & Plan Note (Signed)
A1C just above goal of 7% or less and below threshold for medication change of 8%.  Plan Continue present medication - metformin  Better dietary adherence  Follow-up lab in 6 months (ordered, no appt needed)

## 2012-09-30 ENCOUNTER — Telehealth: Payer: Self-pay | Admitting: *Deleted

## 2012-09-30 NOTE — Telephone Encounter (Signed)
ERROR IN CHARTING 

## 2012-10-12 ENCOUNTER — Other Ambulatory Visit: Payer: Self-pay | Admitting: Gynecology

## 2012-10-12 DIAGNOSIS — Z1231 Encounter for screening mammogram for malignant neoplasm of breast: Secondary | ICD-10-CM

## 2012-11-11 ENCOUNTER — Ambulatory Visit
Admission: RE | Admit: 2012-11-11 | Discharge: 2012-11-11 | Disposition: A | Discharge status: Home / Self Care | Payer: BC Managed Care – PPO | Source: Ambulatory Visit | Attending: Gynecology | Admitting: Gynecology

## 2012-11-11 ENCOUNTER — Other Ambulatory Visit: Payer: Self-pay | Admitting: Gynecology

## 2012-11-11 DIAGNOSIS — Z1231 Encounter for screening mammogram for malignant neoplasm of breast: Secondary | ICD-10-CM

## 2012-11-17 NOTE — Progress Notes (Unsigned)
Patient ID: Meredith [name], female   DOB: March 04, 1951, 61 y.o.   MRN: [id number]

## 2012-11-19 ENCOUNTER — Ambulatory Visit (INDEPENDENT_AMBULATORY_CARE_PROVIDER_SITE_OTHER): Payer: BC Managed Care – PPO

## 2012-11-19 DIAGNOSIS — Z23 Encounter for immunization: Secondary | ICD-10-CM

## 2013-01-31 ENCOUNTER — Ambulatory Visit (INDEPENDENT_AMBULATORY_CARE_PROVIDER_SITE_OTHER): Payer: BC Managed Care – PPO | Admitting: Family Medicine

## 2013-01-31 VITALS — BP 122/72 | HR 88 | Temp 98.4°F | Resp 17 | Ht 66.0 in | Wt 236.0 lb

## 2013-01-31 DIAGNOSIS — J40 Bronchitis, not specified as acute or chronic: Secondary | ICD-10-CM

## 2013-01-31 DIAGNOSIS — J111 Influenza due to unidentified influenza virus with other respiratory manifestations: Secondary | ICD-10-CM

## 2013-01-31 DIAGNOSIS — R6889 Other general symptoms and signs: Secondary | ICD-10-CM

## 2013-01-31 LAB — POCT INFLUENZA A/B
Influenza A, POC: NEGATIVE
Influenza B, POC: NEGATIVE

## 2013-01-31 MED ORDER — ALBUTEROL SULFATE HFA 108 (90 BASE) MCG/ACT IN AERS: 2.0000 | INHALATION_SPRAY | Freq: Four times a day (QID) | RESPIRATORY_TRACT | Status: DC | PRN

## 2013-01-31 MED ORDER — HYDROCODONE-HOMATROPINE 5-1.5 MG/5ML PO SYRP: 5.0000 mL | ORAL_SOLUTION | ORAL | Status: DC | PRN

## 2013-01-31 MED ORDER — CEFDINIR 300 MG PO CAPS: 300.0000 mg | ORAL_CAPSULE | Freq: Two times a day (BID) | ORAL | Status: DC

## 2013-01-31 MED ORDER — BENZONATATE 100 MG PO CAPS: ORAL_CAPSULE | ORAL | Status: DC

## 2013-01-31 NOTE — Patient Instructions (Signed)
Take the antibiotics as directed  Use the cough syrup when you're not working, and the cough pills for when you are working and do not want to be sedated.  Drink plenty of fluids  Get lots of rest  If you feel wheezy you can get the inhaler and take 2 puffs every 4-6 hours

## 2013-01-31 NOTE — Progress Notes (Signed)
Subjective: Patient works as a Pensions consultant. She has been sick for the last couple days with a bad cough. It started with a cough. Has not have much other respiratory symptoms. She does report having had the flu shot this year. She apparently gets these kinds of problems regular. She usually takes steroids and anti-biotics. She apparently started smoking 7 months ago. She has diabetes.  Objective: TMs normal. Throat clear. Neck supple without nodes. Chest is clear without rhonchi rales or wheezes.  Assessment: Cough with flulike illness  Plan: Check flu test even though she had the shot.  Results for orders placed in visit on 01/31/13  POCT INFLUENZA A/B      Component Value Range   Influenza A, POC Negative     Influenza B, POC Negative     decided to go ahead and to rule out of antibiotics.  I do not think steroids were yet indicated. I did give her inhaler to use if she gets wheezy. Advise against overly frequent use of steroids because she is diabetic.

## 2013-05-27 ENCOUNTER — Ambulatory Visit (INDEPENDENT_AMBULATORY_CARE_PROVIDER_SITE_OTHER): Payer: BC Managed Care – PPO | Admitting: Physician Assistant

## 2013-05-27 VITALS — BP 139/86 | HR 75 | Temp 98.6°F | Resp 18 | Ht 65.0 in | Wt 241.0 lb

## 2013-05-27 DIAGNOSIS — R300 Dysuria: Secondary | ICD-10-CM

## 2013-05-27 DIAGNOSIS — N390 Urinary tract infection, site not specified: Secondary | ICD-10-CM

## 2013-05-27 LAB — POCT UA - MICROSCOPIC ONLY

## 2013-05-27 LAB — POCT URINALYSIS DIPSTICK
Glucose, UA: NEGATIVE
Nitrite, UA: POSITIVE
Spec Grav, UA: 1.025
Urobilinogen, UA: 0.2

## 2013-05-27 MED ORDER — PHENAZOPYRIDINE HCL 200 MG PO TABS: 200.0000 mg | ORAL_TABLET | Freq: Three times a day (TID) | ORAL | Status: DC | PRN

## 2013-05-27 MED ORDER — CIPROFLOXACIN HCL 500 MG PO TABS: 500.0000 mg | ORAL_TABLET | Freq: Two times a day (BID) | ORAL | Status: DC

## 2013-05-27 NOTE — Progress Notes (Signed)
   [address], [place] [address]   Phone [phone]  Subjective:    Patient ID: Meredith [name], female    DOB: May 11, 1951, 62 y.o.   MRN: [id number]  HPI Pt to clinic with continued urine symptoms. She went to another clinic on May 17 and was treated with macrobid for a UTI but she never got completely better and then the symptoms got worse.  She is not having vaginal symptoms and she has not changed sexual partners.   Review of Systems  Constitutional: Negative for fever and chills.  Gastrointestinal: Positive for abdominal pain. Negative for nausea.  Genitourinary: Positive for dysuria, urgency, frequency and hematuria. Negative for vaginal discharge.  Musculoskeletal: Positive for back pain.       Objective:   Physical Exam  Vitals reviewed. Constitutional: She is oriented to person, place, and time. She appears well-developed and well-nourished.  HENT:  Head: Normocephalic and atraumatic.  Right Ear: External ear normal.  Left Ear: External ear normal.  Eyes: Conjunctivae are normal.  Cardiovascular: Normal rate, regular rhythm and normal heart sounds.   No murmur heard. Pulmonary/Chest: Effort normal and breath sounds normal.  Abdominal: Soft. There is tenderness (mild suprapubic TTP). There is no CVA tenderness.  Neurological: She is alert and oriented to person, place, and time.  Skin: Skin is warm and dry.  Psychiatric: She has a normal mood and affect. Her behavior is normal. Judgment and thought content normal.    Results for orders placed in visit on 05/27/13  POCT URINALYSIS DIPSTICK      Result Value Range   Color, UA yellow     Clarity, UA cloudy     Glucose, UA neg     Bilirubin, UA neg     Ketones, UA trace     Spec Grav, UA 1.025     Blood, UA moderate     pH, UA 5.5     Protein, UA 30     Urobilinogen, UA 0.2     Nitrite, UA positive     Leukocytes, UA moderate (2+)    POCT UA - MICROSCOPIC ONLY      Result Value Range   WBC, Ur, HPF, POC TNTC      RBC, urine, microscopic 15-20     Bacteria, U Microscopic 2+     Mucus, UA neg     Epithelial cells, urine per micros 2-4     Crystals, Ur, HPF, POC neg     Casts, Ur, LPF, POC neg     Yeast, UA neg           Assessment & Plan:  Dysuria - Plan: POCT urinalysis dipstick, POCT UA - Microscopic Only  UTI (urinary tract infection) - Plan: ciprofloxacin (CIPRO) 500 MG tablet, Urine culture, phenazopyridine (PYRIDIUM) 200 MG tablet  Pt to push fluids - will call with labs results.  Benny Lennert PA-C 05/27/2013 8:32 PM

## 2013-05-30 LAB — URINE CULTURE: Colony Count: >=100000

## 2013-08-18 ENCOUNTER — Ambulatory Visit (INDEPENDENT_AMBULATORY_CARE_PROVIDER_SITE_OTHER): Payer: BC Managed Care – PPO | Admitting: Physician Assistant

## 2013-08-18 VITALS — BP 134/82 | HR 73 | Temp 97.8°F | Resp 18 | Ht 65.5 in | Wt 241.0 lb

## 2013-08-18 DIAGNOSIS — R300 Dysuria: Secondary | ICD-10-CM

## 2013-08-18 DIAGNOSIS — R350 Frequency of micturition: Secondary | ICD-10-CM

## 2013-08-18 DIAGNOSIS — N390 Urinary tract infection, site not specified: Secondary | ICD-10-CM

## 2013-08-18 LAB — POCT UA - MICROSCOPIC ONLY

## 2013-08-18 LAB — POCT URINALYSIS DIPSTICK
Bilirubin, UA: NEGATIVE
Glucose, UA: NEGATIVE
Ketones, UA: NEGATIVE
Nitrite, UA: POSITIVE
Protein, UA: NEGATIVE
Spec Grav, UA: 1.015
Urobilinogen, UA: 0.2
pH, UA: 7.5

## 2013-08-18 MED ORDER — CIPROFLOXACIN HCL 500 MG PO TABS: 500.0000 mg | ORAL_TABLET | Freq: Two times a day (BID) | ORAL | Status: AC

## 2013-08-18 NOTE — Patient Instructions (Signed)
Get plenty of rest and drink at least 64 ounces of water daily.  I will contact you with your lab results as soon as they are available.   If you have not heard from me in 2 weeks, please contact me.  The fastest way to get your results is to register for My Chart (see the instructions on the last page of this printout).   

## 2013-08-18 NOTE — Progress Notes (Signed)
Subjective:    Patient ID: Meredith [name], female    DOB: Sep 27, 1951, 62 y.o.   MRN: [id number]  HPI This 62 y.o. female presents for evaluation of 4 days of urinary urgency, frequency, burning.  "I have a UTI." Similar symptoms in May.  No vaginal symptoms.  No back pain.  No fever or chills.  No nausea or vomiting.  Current Outpatient Prescriptions on File Prior to Visit  Medication Sig Dispense Refill  . aspirin 81 MG tablet Take 81 mg by mouth daily.        . Calcium Carbonate-Vitamin D (CALCIUM 600/VITAMIN D) 600-400 MG-UNIT per tablet Take 1 tablet by mouth 2 (two) times daily.        . Cholecalciferol (VITAMIN D) 2000 UNITS tablet Take 2,000 Units by mouth 3 (three) times daily.        . citalopram (CELEXA) 40 MG tablet Take 1 tablet (40 mg total) by mouth daily.  30 tablet  11  . ezetimibe-simvastatin (VYTORIN) 10-40 MG per tablet Take 1 tablet by mouth daily.  30 tablet  10  . metFORMIN (GLUCOPHAGE) 500 MG tablet Take 1 tablet (500 mg total) by mouth 2 (two) times daily with a meal.  60 tablet  10  . Omega-3 Fatty Acids (FISH OIL) 1200 MG CAPS Take 2 capsules by mouth daily.        . phenazopyridine (PYRIDIUM) 200 MG tablet Take 1 tablet (200 mg total) by mouth 3 (three) times daily as needed for pain.  10 tablet  0   No current facility-administered medications on file prior to visit.   No Known Allergies  Past medical history, surgical history, family history, social history and problem list reviewed.   Review of Systems As above    Objective:   Physical Exam Blood pressure 134/82, pulse 73, temperature 97.8 F (36.6 C), temperature source Oral, resp. rate 18, height 5' 5.5" (1.664 m), weight 241 lb (109.317 kg), SpO2 97.00%. Body mass index is 39.48 kg/(m^2). Well-developed, well nourished WF who is awake, alert and oriented, in NAD. HEENT: [place]/AT, sclera and conjunctiva are clear.   Neck: supple, non-tender, no lymphadenopathy, thyromegaly. Heart: RRR, no murmur Lungs:  normal effort, CTA Abdomen: normo-active bowel sounds, supple, no mass or organomegaly. Mild suprapubic tenderness. Extremities: no cyanosis, clubbing or edema. Skin: warm and dry without rash. Psychologic: good mood and appropriate affect, normal speech and behavior.  Results for orders placed in visit on 08/18/13  POCT UA - MICROSCOPIC ONLY      Result Value Range   WBC, Ur, HPF, POC 10-20     RBC, urine, microscopic 1-2     Bacteria, U Microscopic 4+     Mucus, UA neg     Epithelial cells, urine per micros 0-1     Crystals, Ur, HPF, POC neg     Casts, Ur, LPF, POC neg     Yeast, UA neg    POCT URINALYSIS DIPSTICK      Result Value Range   Color, UA yellow     Clarity, UA hazy     Glucose, UA neg     Bilirubin, UA neg     Ketones, UA neg     Spec Grav, UA 1.015     Blood, UA eng     pH, UA 7.5     Protein, UA neg     Urobilinogen, UA 0.2     Nitrite, UA positive     Leukocytes, UA large (3+)  Assessment & Plan:  Frequent urination/Burning with urination - Plan: POCT UA - Microscopic Only, POCT urinalysis dipstick, Urine culture; ciprofloxacin (CIPRO) 500 MG tablet  Fernande Bras, PA-C Physician Assistant-Certified [facility]

## 2013-08-20 LAB — URINE CULTURE

## 2013-08-23 MED ORDER — NITROFURANTOIN MONOHYD MACRO 100 MG PO CAPS: 100.0000 mg | ORAL_CAPSULE | Freq: Two times a day (BID) | ORAL | Status: AC

## 2013-08-23 NOTE — Addendum Note (Signed)
Addended by: Fernande Bras on: 08/23/2013 08:20 AM   Modules accepted: Orders

## 2013-08-24 ENCOUNTER — Telehealth: Payer: Self-pay

## 2013-08-24 NOTE — Telephone Encounter (Signed)
PATIENT STATES SHE SPOKE WITH SOMEONE AT [facility] ON Monday AND THEY CALLED IN SOME MEDICATION FOR HER THAT SHE PICKED UP TODAY. HOWEVER, SHE SAID WE ALSO CALLED IN MICROBID WHICH SHE HAS NO IDEA WHY? SHE DID NOT GET THAT ONE FROM THE PHARMACY UNTIL WE CALL HER BACK.  BEST PHONE [phone] (CELL)   PHARMACY CHOICE IS [facility] ON [address].   MBC

## 2013-08-25 NOTE — Telephone Encounter (Signed)
Patient notified via My Chart.  Meredith [name],  The bacteria growing in your urine is resistant to the medication I prescribed (Cipro), and to Bactrim(Septra). However, it is SENSITIVE to the nitrofurantoin that you've taken before. I'm going to send that into the pharmacy for  Patient advised Mychart message sent to her regarding this, she did not get the message. Left a message for her to call me back if she has questions.

## 2013-08-26 ENCOUNTER — Ambulatory Visit (INDEPENDENT_AMBULATORY_CARE_PROVIDER_SITE_OTHER): Payer: BC Managed Care – PPO | Admitting: Family Medicine

## 2013-08-26 ENCOUNTER — Ambulatory Visit: Payer: BC Managed Care – PPO

## 2013-08-26 VITALS — BP 176/98 | HR 71 | Temp 97.9°F | Resp 20 | Ht 65.5 in

## 2013-08-26 DIAGNOSIS — M25569 Pain in unspecified knee: Secondary | ICD-10-CM

## 2013-08-26 DIAGNOSIS — M79672 Pain in left foot: Secondary | ICD-10-CM

## 2013-08-26 DIAGNOSIS — M25562 Pain in left knee: Secondary | ICD-10-CM

## 2013-08-26 DIAGNOSIS — R21 Rash and other nonspecific skin eruption: Secondary | ICD-10-CM

## 2013-08-26 DIAGNOSIS — M79609 Pain in unspecified limb: Secondary | ICD-10-CM

## 2013-08-26 DIAGNOSIS — S8002XA Contusion of left knee, initial encounter: Secondary | ICD-10-CM

## 2013-08-26 DIAGNOSIS — S9032XA Contusion of left foot, initial encounter: Secondary | ICD-10-CM

## 2013-08-26 MED ORDER — HYDROCODONE-ACETAMINOPHEN 5-325 MG PO TABS: 1.0000 | ORAL_TABLET | Freq: Four times a day (QID) | ORAL | Status: DC | PRN

## 2013-08-26 MED ORDER — KETOCONAZOLE 2 % EX CREA: TOPICAL_CREAM | Freq: Two times a day (BID) | CUTANEOUS | Status: DC

## 2013-08-26 NOTE — Progress Notes (Signed)
[address]   [place], [place]  [address]   [phone]  Subjective:    Patient ID: Meredith [name], female    DOB: 03-14-1951, 62 y.o.   MRN: [id number]  HPI This 62 y.o. female presents for evaluation after fall this morning.  Tripped on step this morning going into building.  1. L knee pain: fell on knee L; had arthroscopic surgery on knee; not sure if knee is swollen.  +Anterior and medial L knee seems swollen.  Swelling distally on L leg.  Knee is very sore.    2.  L first toe pain: severely painful  Toe is swollen.  Onset after fall this morning.  No medications; no icing.  Continued work day after fall.   3. R foot rash medial aspect; present for months.  Has been applying hydrocortisone without improvement.  Very scaly and itchy at times.  Review of Systems  Constitutional: Negative for fever, chills, diaphoresis and fatigue.  Musculoskeletal: Positive for myalgias, joint swelling, arthralgias and gait problem. Negative for back pain.  Skin: Positive for color change and rash. Negative for pallor and wound.  Neurological: Negative for dizziness and light-headedness.       Objective:   Physical Exam  Nursing note and vitals reviewed. Constitutional: She is oriented to person, place, and time. She appears well-developed and well-nourished. No distress.  HENT:  Head: Normocephalic and atraumatic.  Eyes: Conjunctivae are normal. Pupils are equal, round, and reactive to light.  Cardiovascular: Intact distal pulses.   Capillary refill < 3 seconds L foot.  Musculoskeletal:       Left knee: She exhibits decreased range of motion and bony tenderness. She exhibits no swelling, no effusion, no ecchymosis, no deformity, no laceration, no erythema, normal alignment, normal patellar mobility and normal meniscus. Tenderness found. Medial joint line tenderness noted. No lateral joint line, no MCL, no LCL and no patellar tendon tenderness noted.       Left ankle: Normal.       Left foot: She  exhibits decreased range of motion, tenderness, bony tenderness and swelling. She exhibits normal capillary refill.  L first toe: +swelling diffusely, +ecchymoses present. Pain with flexion/extension.  No deformity. L foot: non-tender to palpation along metatarsals.  Neurological: She is alert and oriented to person, place, and time. No cranial nerve deficit.  Skin: Rash noted. She is not diaphoretic. There is erythema. No pallor.  +scaling rash along ankle with erythema; +defined border.  Psychiatric: She has a normal mood and affect. Her behavior is normal.       [facility] reading (PRIMARY) by  Dr. Katrinka Blazing.  L FOOT:  FIRST PHALANX DISTAL FRACTURE?   L KNEE: DEGENERATIVE/ARTHRITIC CHANGES; +SPURRING; NAD.   Assessment & Plan:  Pain in left foot - Plan: DG Foot Complete Left  Pain, knee, left - Plan: DG Knee Complete 4 Views Left  Skin rash  1. Pain L toe: New. Secondary to trauma.  Rx for hydrocodone provided. 2.  Contusion L toe:  New.  Recommend rest, ice, elevation.  RTC one week if no improvement for repeat xray.   3. Pain L knee:  New.  Secondary to fall.  Rx for hydrocodone. 4. Contusion L Knee:  New.  Recommend rest, ice, elevation.  Avoid prolonged walking or vigorous exercise for two weeks. If no improvement in two weeks, call for ortho referral. 5.  Skin rash:  New. Treat for tinea with Ketoconazole cream; to call in two weeks if no improvement.  Will then treat with topical steroid.    Meds ordered this encounter  Medications  . ketoconazole (NIZORAL) 2 % cream    Sig: Apply topically 2 (two) times daily.    Dispense:  60 g    Refill:  0  . HYDROcodone-acetaminophen (NORCO/VICODIN) 5-325 MG per tablet    Sig: Take 1 tablet by mouth every 6 (six) hours as needed for pain.    Dispense:  30 tablet    Refill:  0

## 2013-08-27 ENCOUNTER — Telehealth: Payer: Self-pay

## 2013-08-27 NOTE — Telephone Encounter (Signed)
Patient is calling because she has not received a phone call back about her x-ray report. It was not done last night and patient is calling to follow up on that.    Best number: [phone]

## 2013-08-28 NOTE — Telephone Encounter (Signed)
Spoke with XR tech this morning they called to get radiologist to read.  Can you please review?

## 2013-09-24 ENCOUNTER — Other Ambulatory Visit: Payer: Self-pay | Admitting: Internal Medicine

## 2013-10-06 ENCOUNTER — Ambulatory Visit: Payer: BC Managed Care – PPO | Admitting: Podiatry

## 2013-10-06 ENCOUNTER — Ambulatory Visit (INDEPENDENT_AMBULATORY_CARE_PROVIDER_SITE_OTHER): Payer: BC Managed Care – PPO | Admitting: Licensed Clinical Social Worker

## 2013-10-06 DIAGNOSIS — F411 Generalized anxiety disorder: Secondary | ICD-10-CM

## 2013-10-06 DIAGNOSIS — F331 Major depressive disorder, recurrent, moderate: Secondary | ICD-10-CM

## 2013-10-09 ENCOUNTER — Ambulatory Visit (INDEPENDENT_AMBULATORY_CARE_PROVIDER_SITE_OTHER): Payer: BC Managed Care – PPO

## 2013-10-09 DIAGNOSIS — Z23 Encounter for immunization: Secondary | ICD-10-CM

## 2013-10-13 ENCOUNTER — Encounter: Payer: Self-pay | Admitting: Podiatry

## 2013-10-13 ENCOUNTER — Ambulatory Visit (INDEPENDENT_AMBULATORY_CARE_PROVIDER_SITE_OTHER): Payer: BC Managed Care – PPO | Admitting: Licensed Clinical Social Worker

## 2013-10-13 ENCOUNTER — Ambulatory Visit (INDEPENDENT_AMBULATORY_CARE_PROVIDER_SITE_OTHER): Payer: BC Managed Care – PPO | Admitting: Podiatry

## 2013-10-13 ENCOUNTER — Ambulatory Visit (INDEPENDENT_AMBULATORY_CARE_PROVIDER_SITE_OTHER): Payer: BC Managed Care – PPO

## 2013-10-13 VITALS — BP 146/86 | HR 88 | Resp 12 | Ht 65.0 in | Wt 220.0 lb

## 2013-10-13 DIAGNOSIS — L259 Unspecified contact dermatitis, unspecified cause: Secondary | ICD-10-CM

## 2013-10-13 DIAGNOSIS — M79609 Pain in unspecified limb: Secondary | ICD-10-CM

## 2013-10-13 DIAGNOSIS — F411 Generalized anxiety disorder: Secondary | ICD-10-CM

## 2013-10-13 DIAGNOSIS — G8929 Other chronic pain: Secondary | ICD-10-CM

## 2013-10-13 DIAGNOSIS — F331 Major depressive disorder, recurrent, moderate: Secondary | ICD-10-CM

## 2013-10-13 DIAGNOSIS — M722 Plantar fascial fibromatosis: Secondary | ICD-10-CM

## 2013-10-13 MED ORDER — METHYLPREDNISOLONE 4 MG PO KIT: PACK | ORAL | Status: DC

## 2013-10-13 MED ORDER — TRIAMCINOLONE ACETONIDE 10 MG/ML IJ SUSP
5.0000 mg | Freq: Once | INTRAMUSCULAR | Status: AC
  Administered 2013-10-13: 5 mg via INTRA_ARTICULAR

## 2013-10-13 NOTE — Progress Notes (Signed)
N SORE L  LT FOOT HEEL D  2 MONTHS O  SLOWLY C   WORSE A   PRESSURE T   ICE

## 2013-10-14 NOTE — Progress Notes (Signed)
Subjective:     Patient ID: Meredith [name], female   DOB: 08-27-1951, 62 y.o.   MRN: [id number]  Foot Pain   patient presents stating my heel has been hurting me for 2 months. States that it has gradually intensified and is making activity difficult with the pain being most pronounced after sitting and in the morning   Review of Systems  All other systems reviewed and are negative.       Objective:   Physical Exam  Nursing note and vitals reviewed. Constitutional: She appears well-developed and well-nourished.  Cardiovascular: Intact distal pulses.   Musculoskeletal: Normal range of motion.  Neurological: She is alert.  Skin: Skin is warm.   the left heel at the medial calcaneal tubercle is inflamed and sore when pressed    Assessment:     Plantar fasciitis left acute nature    Plan:     H&P performed condition discussed. X-rays reviewed with patient at this time. Injected the left plantar fascia 3 mg Kenalog 5 mg Xylocaine Marcaine mixture and applied fascial brace reappoint one week

## 2013-10-20 ENCOUNTER — Ambulatory Visit (INDEPENDENT_AMBULATORY_CARE_PROVIDER_SITE_OTHER): Payer: BC Managed Care – PPO | Admitting: Licensed Clinical Social Worker

## 2013-10-20 DIAGNOSIS — F4323 Adjustment disorder with mixed anxiety and depressed mood: Secondary | ICD-10-CM

## 2013-10-21 ENCOUNTER — Telehealth: Payer: Self-pay

## 2013-10-21 NOTE — Telephone Encounter (Signed)
After 64 or 5 years after 1st. So she should have the next one when she is 62 y.o. Discussed with Chelle called patient to advise.

## 2013-10-21 NOTE — Telephone Encounter (Signed)
Patient is calling to ask when she is due for her next  pneumonia shot. Can someone please help her? I tried but I was not sure, I told her that someone clinical will be able to answer it. She believes she is overdue for one. Thank you!  Best; [phone]- she said if you leave a voicemail to please leave a detailed one saying if she does or does not need a shot.

## 2013-10-27 ENCOUNTER — Ambulatory Visit: Payer: BC Managed Care – PPO | Admitting: Licensed Clinical Social Worker

## 2013-10-27 ENCOUNTER — Encounter: Payer: Self-pay | Admitting: Podiatry

## 2013-10-27 ENCOUNTER — Ambulatory Visit (INDEPENDENT_AMBULATORY_CARE_PROVIDER_SITE_OTHER): Payer: BC Managed Care – PPO | Admitting: Podiatry

## 2013-10-27 VITALS — BP 170/98 | HR 78 | Resp 18 | Wt 220.0 lb

## 2013-10-27 DIAGNOSIS — M722 Plantar fascial fibromatosis: Secondary | ICD-10-CM

## 2013-10-27 MED ORDER — TRIAMCINOLONE ACETONIDE 10 MG/ML IJ SUSP
5.0000 mg | Freq: Once | INTRAMUSCULAR | Status: AC
  Administered 2013-10-27: 5 mg via INTRA_ARTICULAR

## 2013-10-27 NOTE — Progress Notes (Signed)
Subjective:     Patient ID: Meredith [name], female   DOB: Sep 08, 1951, 62 y.o.   MRN: [id number]  HPI patient states my heel is still hurting not quite as bad but still sore in the center and outside of the heel  Review of Systems     Objective:   Physical Exam  Nursing note and vitals reviewed. Constitutional: She is oriented to person, place, and time.  Cardiovascular: Intact distal pulses.   Neurological: She is oriented to person, place, and time.   pain in the center and center lateral of the heel with depression of the arch noted  bilateral     Assessment:     Plantar fasciitis left heel with structural and down    Plan:     From the lateral side injected the plantar fascia 3 mg Kenalog 5 mg Xylocaine Marcaine mixture and scanned for custom orthotic devic

## 2013-10-27 NOTE — Progress Notes (Signed)
Pt presents for follow-up for left foot plantar fasciitis.

## 2013-10-28 ENCOUNTER — Other Ambulatory Visit: Payer: Self-pay | Admitting: Internal Medicine

## 2013-11-01 ENCOUNTER — Telehealth: Payer: Self-pay | Admitting: *Deleted

## 2013-11-01 NOTE — Telephone Encounter (Signed)
Left message to bring old orthotics to the next appt with Dr Charlsie Merles, to determine if they would be helpful at this time or need a new orthotic prescription.

## 2013-11-03 ENCOUNTER — Ambulatory Visit (INDEPENDENT_AMBULATORY_CARE_PROVIDER_SITE_OTHER): Payer: BC Managed Care – PPO | Admitting: Licensed Clinical Social Worker

## 2013-11-03 DIAGNOSIS — F4323 Adjustment disorder with mixed anxiety and depressed mood: Secondary | ICD-10-CM

## 2013-11-04 ENCOUNTER — Other Ambulatory Visit: Payer: Self-pay | Admitting: Internal Medicine

## 2013-11-05 ENCOUNTER — Other Ambulatory Visit: Payer: Self-pay | Admitting: Internal Medicine

## 2013-11-05 ENCOUNTER — Encounter: Payer: Self-pay | Admitting: Internal Medicine

## 2013-11-05 ENCOUNTER — Ambulatory Visit (INDEPENDENT_AMBULATORY_CARE_PROVIDER_SITE_OTHER): Payer: BC Managed Care – PPO | Admitting: Internal Medicine

## 2013-11-05 ENCOUNTER — Other Ambulatory Visit (INDEPENDENT_AMBULATORY_CARE_PROVIDER_SITE_OTHER): Payer: BC Managed Care – PPO

## 2013-11-05 VITALS — BP 152/100 | HR 64 | Temp 96.9°F | Ht 65.0 in | Wt 244.0 lb

## 2013-11-05 DIAGNOSIS — E119 Type 2 diabetes mellitus without complications: Secondary | ICD-10-CM

## 2013-11-05 DIAGNOSIS — IMO0001 Reserved for inherently not codable concepts without codable children: Secondary | ICD-10-CM

## 2013-11-05 DIAGNOSIS — E785 Hyperlipidemia, unspecified: Secondary | ICD-10-CM

## 2013-11-05 DIAGNOSIS — I10 Essential (primary) hypertension: Secondary | ICD-10-CM

## 2013-11-05 DIAGNOSIS — F411 Generalized anxiety disorder: Secondary | ICD-10-CM

## 2013-11-05 LAB — BASIC METABOLIC PANEL
CO2: 30 mEq/L (ref 19–32)
Calcium: 9.3 mg/dL (ref 8.4–10.5)
Chloride: 104 mEq/L (ref 96–112)
Creatinine, Ser: 0.5 mg/dL (ref 0.4–1.2)
Potassium: 3.9 mEq/L (ref 3.5–5.1)
Sodium: 139 mEq/L (ref 135–145)

## 2013-11-05 LAB — HEPATIC FUNCTION PANEL
ALT: 22 U/L (ref 0–35)
Alkaline Phosphatase: 74 U/L (ref 39–117)
Bilirubin, Direct: 0.1 mg/dL (ref 0.0–0.3)
Total Bilirubin: 0.5 mg/dL (ref 0.3–1.2)
Total Protein: 7.3 g/dL (ref 6.0–8.3)

## 2013-11-05 LAB — URINALYSIS, ROUTINE W REFLEX MICROSCOPIC
Hgb urine dipstick: NEGATIVE
Ketones, ur: NEGATIVE
Specific Gravity, Urine: 1.01 (ref 1.000–1.030)
Total Protein, Urine: NEGATIVE
Urine Glucose: NEGATIVE
Urobilinogen, UA: 0.2 (ref 0.0–1.0)

## 2013-11-05 LAB — LIPID PANEL
HDL: 54.2 mg/dL (ref >39.00)
LDL Cholesterol: 84 mg/dL (ref 0–99)
Total CHOL/HDL Ratio: 3

## 2013-11-05 MED ORDER — ESCITALOPRAM OXALATE 10 MG PO TABS: 10.0000 mg | ORAL_TABLET | Freq: Every day | ORAL | Status: DC

## 2013-11-05 MED ORDER — IRBESARTAN 150 MG PO TABS: 150.0000 mg | ORAL_TABLET | Freq: Every day | ORAL | Status: DC

## 2013-11-05 MED ORDER — METFORMIN HCL 500 MG PO TABS: ORAL_TABLET | ORAL | Status: DC

## 2013-11-05 NOTE — Assessment & Plan Note (Addendum)
?   Control, for f/u lab, cont metformin, diet, wt loss efforts, exercise, consider incr OHA for a1c > 7  Note:  Total time for pt hx, exam, review of record with pt in the room, determination of diagnoses and plan for further eval and tx is > 40 min, with over 50% spent in coordination and counseling of patient

## 2013-11-05 NOTE — Patient Instructions (Signed)
Please take all new medication as prescribed - the generic avapro at 150 mg per day for Blood Pressure, and the lexapro 10 mg (instead of the celexa) Please call in 1 wk if BP still consistently more then 140/90 Please continue all other medications as before, and refills have been done if requested. - the vytorin Please go to the LAB in the Basement (turn left off the elevator) for the tests to be done today You will be contacted by phone if any changes need to be made immediately.  Otherwise, you will receive a letter about your results with an explanation, but please check with MyChart first.  Please remember to sign up for My Chart if you have not done so, as this will be important to you in the future with finding out test results, communicating by private email, and scheduling acute appointments online when needed.  Please see Dr Debby Bud in Dec 2014 as you have planned

## 2013-11-05 NOTE — Assessment & Plan Note (Signed)
Out of vytorin x 2 mo, to re-start, for baseline lipid today and prob f/u lab with f/u visit with PCP next month

## 2013-11-05 NOTE — Progress Notes (Signed)
Subjective:    Patient ID: Meredith [name], female    DOB: 11-15-1951, 62 y.o.   MRN: [id number]  HPI  Here to f/u as out of meds and Dr Debby Bud not able to see; recent BP at mult providers ([facility] and other) with BP > 140/90 such as 176.98, 177/117, 167/94.  Pt denies chest pain, increased sob or doe, wheezing, orthopnea, PND, increased LE swelling, palpitations, dizziness or syncope.   Pt denies polydipsia, polyuria, or low sugar symptoms such as weakness or confusion improved with po intake.  Pt states overall good compliance with meds, trying to follow lower cholesterol, diabetic diet, wt overall stable but little exercise however, has gained approx 15 lbs since last a1c July 2013 of just over 7.  Pt denies new neurological symptoms such as new headache, or facial or extremity weakness or numbness, but has occas HA and dizziness recent ? Assoc with elev BP.  Has also had worsening mild depressive symptoms, no suicidal ideation, or panic; has ongoing anxiety, some worse recently, all since stopping her celexa 2 mo ago when did not seem to her to be helping further, though had been on 40 mg for several yrs.  Needs refill vytorin, also out for approx 2 mo. Past Medical History  Diagnosis Date  . Unspecified sleep apnea   . Other and unspecified hyperlipidemia   . Depressive disorder, not elsewhere classified   . Anxiety state, unspecified   . Asthma   . Diabetes mellitus without complication    Past Surgical History  Procedure Laterality Date  . Hemorrhoid surgery    . Cesarean section    . Knee arthroscopy      reports that she has been smoking Cigarettes.  She has a 7 pack-year smoking history. She has never used smokeless tobacco. She reports that she drinks alcohol. She reports that she does not use illicit drugs. family history includes Cancer in her father; Diabetes in her father; Heart attack (age of onset: 20) in her father. No Known Allergies Current Outpatient Prescriptions on File Prior to  Visit  Medication Sig Dispense Refill  . aspirin 81 MG tablet Take 81 mg by mouth daily.        Marland Kitchen ketoconazole (NIZORAL) 2 % cream Apply topically 2 (two) times daily.  60 g  0  . Omega-3 Fatty Acids (FISH OIL) 1200 MG CAPS Take 2 capsules by mouth daily.        Marland Kitchen VYTORIN 10-40 MG per tablet TAKE 1 TABLET BY MOUTH DAILY  30 tablet  0   No current facility-administered medications on file prior to visit.   Review of Systems  Constitutional: Negative for unexpected weight change, or unusual diaphoresis  HENT: Negative for tinnitus.   Eyes: Negative for photophobia and visual disturbance.  Respiratory: Negative for choking and stridor.   Gastrointestinal: Negative for vomiting and blood in stool.  Genitourinary: Negative for hematuria and decreased urine volume.  Musculoskeletal: Negative for acute joint swelling Skin: Negative for color change and wound.  Neurological: Negative for tremors and numbness other than noted  Psychiatric/Behavioral: Negative for decreased concentration or  hyperactivity.       Objective:   Physical Exam BP 152/100  Pulse 64  Temp(Src) 96.9 F (36.1 C) (Oral)  Ht 5\' 5"  (1.651 m)  Wt 244 lb (110.678 kg)  BMI 40.60 kg/m2  SpO2 96%.  VS noted,  Constitutional: Pt appears well-developed and well-nourished.  HENT: Head: NCAT.  Right Ear: External ear normal.  Left  Ear: External ear normal.  Eyes: Conjunctivae and EOM are normal. Pupils are equal, round, and reactive to light.  Neck: Normal range of motion. Neck supple.  Cardiovascular: Normal rate and regular rhythm.   Pulmonary/Chest: Effort normal and breath sounds normal.  Neurological: Pt is alert. Not confused , cn 2-12 intact, motor 5/5 Skin: Skin is warm. No erythema.  Psychiatric: Pt behavior is normal. Thought content normal. + dysphoric    Assessment & Plan:

## 2013-11-05 NOTE — Assessment & Plan Note (Signed)
Ok for lexapro 10 qd, f/u with PCP next mo

## 2013-11-05 NOTE — Progress Notes (Signed)
Pre visit review using our clinic review tool, if applicable. No additional management support is needed unless otherwise documented below in the visit note. 

## 2013-11-05 NOTE — Assessment & Plan Note (Signed)
In light of DM, will add ARB  - avapro 150, f/u BP at home, call in 1-2 wks if persistent elev to consider incr avapro to 300, f/u o/w with PCP next mo as planned, for low salt, wt loss, exercise, ETOH in moderation

## 2013-11-09 ENCOUNTER — Telehealth: Payer: Self-pay

## 2013-11-09 MED ORDER — IRBESARTAN 300 MG PO TABS: 300.0000 mg | ORAL_TABLET | Freq: Every day | ORAL | Status: DC

## 2013-11-09 NOTE — Telephone Encounter (Signed)
Ok to increase to 300 qd, f/u with Dr Debby Bud as planned

## 2013-11-09 NOTE — Telephone Encounter (Signed)
The patient stated her BP is still elevated after starting the new medication.  Average of 177/10, 168/100 and 155/100.  Please advise

## 2013-11-09 NOTE — Telephone Encounter (Signed)
Called the patient left a detailed message of MD instructions. 

## 2013-11-10 ENCOUNTER — Ambulatory Visit: Payer: BC Managed Care – PPO | Admitting: Licensed Clinical Social Worker

## 2013-11-22 ENCOUNTER — Other Ambulatory Visit: Payer: Self-pay

## 2013-11-22 DIAGNOSIS — Z1231 Encounter for screening mammogram for malignant neoplasm of breast: Secondary | ICD-10-CM

## 2013-12-01 ENCOUNTER — Ambulatory Visit: Payer: BC Managed Care – PPO

## 2013-12-03 ENCOUNTER — Other Ambulatory Visit: Payer: Self-pay | Admitting: Internal Medicine

## 2013-12-03 ENCOUNTER — Telehealth: Payer: Self-pay

## 2013-12-03 MED ORDER — EZETIMIBE-SIMVASTATIN 10-40 MG PO TABS: 1.0000 | ORAL_TABLET | Freq: Every day | ORAL | Status: DC

## 2013-12-03 NOTE — Telephone Encounter (Signed)
Patient called requesting a refill on Vytorin

## 2013-12-03 NOTE — Telephone Encounter (Signed)
Patient called for a refill of Vytorin be sent to [facility] on [address]

## 2013-12-29 ENCOUNTER — Encounter: Payer: Self-pay | Admitting: Internal Medicine

## 2013-12-29 ENCOUNTER — Ambulatory Visit (INDEPENDENT_AMBULATORY_CARE_PROVIDER_SITE_OTHER): Payer: BC Managed Care – PPO | Admitting: Internal Medicine

## 2013-12-29 ENCOUNTER — Ambulatory Visit: Payer: BC Managed Care – PPO

## 2013-12-29 VITALS — BP 156/94 | HR 60 | Temp 97.8°F | Wt 244.0 lb

## 2013-12-29 DIAGNOSIS — E785 Hyperlipidemia, unspecified: Secondary | ICD-10-CM

## 2013-12-29 DIAGNOSIS — IMO0001 Reserved for inherently not codable concepts without codable children: Secondary | ICD-10-CM

## 2013-12-29 DIAGNOSIS — F329 Major depressive disorder, single episode, unspecified: Secondary | ICD-10-CM

## 2013-12-29 DIAGNOSIS — Z0000 Encounter for general adult medical examination without abnormal findings: Secondary | ICD-10-CM

## 2013-12-29 DIAGNOSIS — E1169 Type 2 diabetes mellitus with other specified complication: Secondary | ICD-10-CM

## 2013-12-29 DIAGNOSIS — F3289 Other specified depressive episodes: Secondary | ICD-10-CM

## 2013-12-29 DIAGNOSIS — L988 Other specified disorders of the skin and subcutaneous tissue: Secondary | ICD-10-CM

## 2013-12-29 DIAGNOSIS — E669 Obesity, unspecified: Secondary | ICD-10-CM

## 2013-12-29 DIAGNOSIS — E119 Type 2 diabetes mellitus without complications: Secondary | ICD-10-CM

## 2013-12-29 DIAGNOSIS — E11620 Type 2 diabetes mellitus with diabetic dermatitis: Secondary | ICD-10-CM

## 2013-12-29 DIAGNOSIS — M791 Myalgia, unspecified site: Secondary | ICD-10-CM

## 2013-12-29 DIAGNOSIS — I10 Essential (primary) hypertension: Secondary | ICD-10-CM

## 2013-12-29 MED ORDER — METFORMIN HCL 1000 MG PO TABS: ORAL_TABLET | ORAL | Status: DC

## 2013-12-29 MED ORDER — BETAMETHASONE DIPROPIONATE 0.05 % EX CREA: TOPICAL_CREAM | Freq: Two times a day (BID) | CUTANEOUS | Status: DC

## 2013-12-29 MED ORDER — FUROSEMIDE 20 MG PO TABS: 20.0000 mg | ORAL_TABLET | Freq: Every day | ORAL | Status: DC

## 2013-12-29 MED ORDER — ALPRAZOLAM 0.5 MG PO TBDP: 0.5000 mg | ORAL_TABLET | Freq: Four times a day (QID) | ORAL | Status: DC | PRN

## 2013-12-29 NOTE — Progress Notes (Signed)
Pre visit review using our clinic review tool, if applicable. No additional management support is needed unless otherwise documented below in the visit note. 

## 2013-12-29 NOTE — Patient Instructions (Addendum)
You are your #1 job!!!!!  1. Diabetes - out of control Plan Continue metformin 1,000 mg twice a day  DIET - NO Sugar and low carb. Will refer to dietician  A1C in Feb  2. Skin rash - necrobiosis lipoidica Diabeticorum Plan Apply betamethasone cream twice a day until much better.   This will wax and wane - use the cream as needed.  3. Blood pressure - too high Plan Continue avapro  Start furosemide 20 mg once a day  Follow up in February - lab first then visit  4. Diffuse muscle pain - ??? Plan Lab in Feb to look for underlying rheumatologic disease. Fibromyalgia is a disease of exclusion  5. Psych - continue lexapro. Xanax as needed. Plan Therapy can be helpful, as you know. Give it another shot, perhaps with a different therapist.   6. Health maintenance - you are up to date.

## 2013-12-29 NOTE — Progress Notes (Signed)
Subjective:    Patient ID: Meredith [name], female    DOB: 11-Feb-1951, 62 y.o.   MRN: [id number]  HPI Meredith [name] presents for a wellness exam. She is concerned about her blood pressure which has been running high. She did see Meredith [name] Nov 7th, note reviewed, and had full physical exam except for breast and pelvic exam which is deferred to Meredith Rivers, MD. All labs were also done: good cholesterol control, A1C 8.4%. Meredith [name] did double her metformin and started Avapro at 150 mg and advanced to 300 mg daily. She is current with dental care and eye care.   She has had a fall and injured her knee - she has seen Meredith [name] for the knee pain.  She has seen a therapist in the past.   Past Medical History  Diagnosis Date  . Unspecified sleep apnea   . Other and unspecified hyperlipidemia   . Depressive disorder, not elsewhere classified   . Anxiety state, unspecified   . Asthma   . Diabetes mellitus without complication    Past Surgical History  Procedure Laterality Date  . Hemorrhoid surgery    . Cesarean section    . Knee arthroscopy     Family History  Problem Relation Age of Onset  . Heart attack Father 60  . Diabetes Father   . Cancer Father     lung   History   Social History  . Marital Status: Divorced    Spouse Name: N/A    Number of Children: N/A  . Years of Education: N/A   Occupational History  . Not on file.   Social History Main Topics  . Smoking status: Current Every Day Smoker -- 0.50 packs/day for 14 years    Types: Cigarettes  . Smokeless tobacco: Never Used  . Alcohol Use: Yes  . Drug Use: No  . Sexual Activity: No   Other Topics Concern  . 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 BA- psych from [employer].   Married - 14 years -divorced   3 daughters - twins 1985, 44   Work: Artist substance abuse counselor   No h/o physical or sexual abuse    Current Outpatient  Prescriptions on File Prior to Visit  Medication Sig Dispense Refill  . aspirin 81 MG tablet Take 81 mg by mouth daily.        Marland Kitchen escitalopram (LEXAPRO) 10 MG tablet Take 1 tablet (10 mg total) by mouth daily.  90 tablet  3  . ezetimibe-simvastatin (VYTORIN) 10-40 MG per tablet Take 1 tablet by mouth daily.  30 tablet  5  . irbesartan (AVAPRO) 300 MG tablet Take 1 tablet (300 mg total) by mouth daily.  90 tablet  3  . ketoconazole (NIZORAL) 2 % cream Apply topically 2 (two) times daily.  60 g  0  . Omega-3 Fatty Acids (FISH OIL) 1200 MG CAPS Take 2 capsules by mouth daily.         No current facility-administered medications on file prior to visit.      Review of Systems Constitutional:  Negative for fever, chills, activity change and unexpected weight change.  HEENT:  Negative for hearing loss, ear pain, congestion, neck stiffness and postnasal drip. Negative for sore throat or swallowing problems. Negative for dental complaints.   Eyes: Negative for vision loss or change in visual acuity.  Respiratory: Negative for chest tightness and  wheezing. Negative for DOE.   Cardiovascular: Negative for chest pain or palpitations. No decreased exercise tolerance Gastrointestinal: No change in bowel habit. No bloating or gas. No reflux or indigestion Genitourinary: Negative for urgency, frequency, flank pain and difficulty urinating.  Musculoskeletal: positive for myalgias, no  back pain, arthralgias present both knees  and gait problem.  Neurological: Negative for dizziness, tremors, weakness and headaches.  Hematological: Negative for adenopathy.  Psychiatric/Behavioral: Negative for behavioral problems and dysphoric mood.       Objective:   Physical Exam Filed Vitals:   12/29/13 0924  BP: 156/94  Pulse: 60  Temp: 97.8 F (36.6 C)   Wt Readings from Last 3 Encounters:  12/29/13 244 lb (110.678 kg)  11/05/13 244 lb (110.678 kg)  10/27/13 220 lb (99.791 kg)   BP Readings from Last 3  Encounters:  12/29/13 156/94  11/05/13 152/100  10/27/13 170/98   Gen'l- WNWD white woman in no distress HEENtT- C&S clear, PERRLA Neck- supple Cor- 2+ radial RRR Pulm - CTAP Pelvic/Rectal deferred to gyn Ext - normal Neuro - A&O x 3, CN II-XII normal Derm - no lesions appreciated  Recent Results (from the past 2160 hour(s))  HEMOGLOBIN A1C     Status: Abnormal   Collection Time    11/05/13  3:04 PM      Result Value Range   Hemoglobin A1C 8.4 (*) 4.6 - 6.5 %   Comment: Glycemic Control Guidelines for People with Diabetes:Non Diabetic:  <6%Goal of Therapy: <7%Additional Action Suggested:  >8%   LIPID PANEL     Status: None   Collection Time    11/05/13  3:04 PM      Result Value Range   Cholesterol 157  0 - 200 mg/dL   Comment: ATP III Classification       Desirable:  < 200 mg/dL               Borderline High:  200 - 239 mg/dL          High:  > = 010 mg/dL   Triglycerides 27.2  0.0 - 149.0 mg/dL   Comment: Normal:  <536 mg/dLBorderline High:  150 - 199 mg/dL   HDL 64.40  >34.74 mg/dL   VLDL 25.9  0.0 - 56.3 mg/dL   LDL Cholesterol 84  0 - 99 mg/dL   Total CHOL/HDL Ratio 3     Comment:                Men          Women1/2 Average Risk     3.4          3.3Average Risk          5.0          4.42X Average Risk          9.6          7.13X Average Risk          15.0          11.0                      BASIC METABOLIC PANEL     Status: Abnormal   Collection Time    11/05/13  3:04 PM      Result Value Range   Sodium 139  135 - 145 mEq/L   Potassium 3.9  3.5 - 5.1 mEq/L   Chloride 104  96 - 112 mEq/L   CO2 30  19 - 32 mEq/L   Glucose, Bld 161 (*) 70 - 99 mg/dL   BUN 11  6 - 23 mg/dL   Creatinine, Ser 0.5  0.4 - 1.2 mg/dL   Calcium 9.3  8.4 - 16.1 mg/dL   GFR 096.04  >54.09 mL/min  HEPATIC FUNCTION PANEL     Status: None   Collection Time    11/05/13  3:04 PM      Result Value Range   Total Bilirubin 0.5  0.3 - 1.2 mg/dL   Bilirubin, Direct 0.1  0.0 - 0.3 mg/dL   Alkaline  Phosphatase 74  39 - 117 U/L   AST 24  0 - 37 U/L   ALT 22  0 - 35 U/L   Total Protein 7.3  6.0 - 8.3 g/dL   Albumin 3.7  3.5 - 5.2 g/dL  URINALYSIS, ROUTINE W REFLEX MICROSCOPIC     Status: None   Collection Time    11/05/13  3:04 PM      Result Value Range   Color, Urine Yellow  Yellow;Lt. Yellow   APPearance Clear  Clear   Specific Gravity, Urine 1.010  1.000-1.030   pH 6.0  5.0 - 8.0   Total Protein, Urine Negative  Negative   Urine Glucose Negative  Negative   Ketones, ur Negative  Negative   Bilirubin Urine Negative  Negative   Hgb urine dipstick Negative  Negative   Urobilinogen, UA 0.2 mg/dL  0.0 - 1.0   Leukocytes, UA Negative  Negative   Nitrite Negative  Negative   WBC, UA 0-2/hpf  0-2/hpf   Squamous Epithelial / LPF Rare(0-4/hpf)  Rare(0-4/hpf)   Bacteria, UA Rare(<10/hpf)  None          Assessment & Plan:

## 2013-12-31 DIAGNOSIS — E11620 Type 2 diabetes mellitus with diabetic dermatitis: Secondary | ICD-10-CM | POA: Insufficient documentation

## 2013-12-31 NOTE — Assessment & Plan Note (Signed)
Taking and tolerating "statin" therapy plus zetia. Last lab 2 months ago with LDL better than goal of 100 or less and HDL better than goal of 40+. LFTs normal.  Plan  continue present regimen

## 2013-12-31 NOTE — Assessment & Plan Note (Signed)
Body mass index is 40.6 kg/(m^2).  Continuing problem  Plan Diet management: smart food choices, PORTION SIZE CONTROL, regular exercise. Goal - to loose 1-2 lbs.month. Target weight -BMI 30

## 2013-12-31 NOTE — Assessment & Plan Note (Signed)
Blood pressure - too high Plan Continue avapro  Start furosemide 20 mg once a day  Follow up in February - lab first then visit

## 2013-12-31 NOTE — Assessment & Plan Note (Signed)
Psych - continue lexapro. Xanax as needed. Plan Therapy can be helpful, as you know. Give it another shot, perhaps with a different therapist.

## 2013-12-31 NOTE — Assessment & Plan Note (Signed)
Interval history notable for poor control of DM and blood pressure but no hospitalizations, no surgery or injury. Limited physical exam is normal. She is current with gyn, colorectal and breast cancer screening. Lab results reviewed and OK except for serum glucose and A1C. Immunizations - Prevnar offered today with pneumovax at next office visit.

## 2013-12-31 NOTE — Assessment & Plan Note (Signed)
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  Plan  class II topical steroid as needed as the rash waxes and wanes.

## 2013-12-31 NOTE — Assessment & Plan Note (Signed)
Diabetes - out of control A1C 8.4%  Plan Continue metformin 1,000 mg twice a day  DIET - NO Sugar and low carb. Will refer to dietician  A1C in Feb

## 2014-01-19 ENCOUNTER — Ambulatory Visit: Payer: BC Managed Care – PPO

## 2014-01-24 ENCOUNTER — Telehealth: Payer: Self-pay | Admitting: Internal Medicine

## 2014-01-24 NOTE — Telephone Encounter (Signed)
Relevant patient education assigned to patient using Emmi. ° °

## 2014-02-21 ENCOUNTER — Ambulatory Visit (INDEPENDENT_AMBULATORY_CARE_PROVIDER_SITE_OTHER): Payer: BC Managed Care – PPO

## 2014-02-21 DIAGNOSIS — E119 Type 2 diabetes mellitus without complications: Secondary | ICD-10-CM

## 2014-02-21 DIAGNOSIS — M791 Myalgia, unspecified site: Secondary | ICD-10-CM

## 2014-02-21 DIAGNOSIS — IMO0001 Reserved for inherently not codable concepts without codable children: Secondary | ICD-10-CM

## 2014-02-21 DIAGNOSIS — I10 Essential (primary) hypertension: Secondary | ICD-10-CM

## 2014-02-21 LAB — BASIC METABOLIC PANEL
BUN: 11 mg/dL (ref 6–23)
CHLORIDE: 105 meq/L (ref 96–112)
CO2: 28 meq/L (ref 19–32)
CREATININE: 0.7 mg/dL (ref 0.4–1.2)
Calcium: 8.9 mg/dL (ref 8.4–10.5)
GFR: 92.55 mL/min (ref >60.00)
Glucose, Bld: 173 mg/dL — ABNORMAL HIGH (ref 70–99)
Potassium: 4.1 mEq/L (ref 3.5–5.1)
Sodium: 140 mEq/L (ref 135–145)

## 2014-02-21 LAB — CK: Total CK: 61 U/L (ref 7–177)

## 2014-02-21 LAB — HIGH SENSITIVITY CRP: CRP, High Sensitivity: 5.04 mg/L — ABNORMAL HIGH (ref 0.000–5.000)

## 2014-02-21 LAB — HEMOGLOBIN A1C: Hgb A1c MFr Bld: 7.7 % — ABNORMAL HIGH (ref 4.6–6.5)

## 2014-02-21 LAB — RHEUMATOID FACTOR: RHEUMATOID FACTOR: 11 [IU]/mL (ref <=14)

## 2014-02-22 LAB — ANTI-NUCLEAR AB-TITER (ANA TITER): ANA TITER 1: NEGATIVE (ref <1:40)

## 2014-02-22 LAB — ANA: Anti Nuclear Antibody(ANA): POSITIVE — AB

## 2014-02-23 ENCOUNTER — Ambulatory Visit (INDEPENDENT_AMBULATORY_CARE_PROVIDER_SITE_OTHER): Payer: BC Managed Care – PPO | Admitting: Internal Medicine

## 2014-02-23 ENCOUNTER — Encounter: Payer: Self-pay | Admitting: Internal Medicine

## 2014-02-23 VITALS — BP 128/90 | HR 68 | Temp 97.6°F | Wt 246.2 lb

## 2014-02-23 DIAGNOSIS — J438 Other emphysema: Secondary | ICD-10-CM | POA: Insufficient documentation

## 2014-02-23 DIAGNOSIS — I10 Essential (primary) hypertension: Secondary | ICD-10-CM

## 2014-02-23 DIAGNOSIS — F329 Major depressive disorder, single episode, unspecified: Secondary | ICD-10-CM

## 2014-02-23 DIAGNOSIS — F3289 Other specified depressive episodes: Secondary | ICD-10-CM

## 2014-02-23 DIAGNOSIS — E119 Type 2 diabetes mellitus without complications: Secondary | ICD-10-CM

## 2014-02-23 DIAGNOSIS — F411 Generalized anxiety disorder: Secondary | ICD-10-CM

## 2014-02-23 NOTE — Progress Notes (Signed)
Pre visit review using our clinic review tool, if applicable. No additional management support is needed unless otherwise documented below in the visit note. 

## 2014-02-23 NOTE — Progress Notes (Signed)
   Subjective:    Patient ID: Meredith [name], female    DOB: [birth year], 63 y.o.   MRN: [id number]  HPI Meredith [name] presents for follow - up and renewal of Xanax prescription. She is also here to review labs: ANA, CRP, CK, RF all negative. A1C 7.7%. Serum glucose 173. Normal renal function.   She does report leg fatigue/heaviness with any type of work.   Pulmonary - SOB/DOE. CT 2011 with centro-lobular emphysema. No PFTs on file.   Review of Systems     Objective:   Physical Exam Filed Vitals:   02/23/14 1101  BP: 128/90  Pulse: 68  Temp: 97.6 F (36.4 C)   Wt Readings from Last 3 Encounters:  02/23/14 246 lb 3.2 oz (111.676 kg)  12/29/13 244 lb (110.678 kg)  11/05/13 244 lb (110.678 kg)   Gen'l- overweight woman in no distress HEENT - C&S clear Cor - 2+ radial, 2+ DP -easy to palpate, good capillary refill toes Pulm - normal respirations.       Assessment & Plan:  Leg weakness - no evidence of connective tissue disease or rheumatologic disease as etiology of muscle weakness. On exam no evidence of vascular insufficiency.  Plan Increased exercise including LE strengthening.

## 2014-02-23 NOTE — Patient Instructions (Signed)
1. Muscle weakness - ANA, CRP, CK, RF all negative. No underlying disease that would explain muscle weakness or soreness. You have a good pulse in the foot which make it unlikely that the leg weakness is due to circulation.  Plan Resume exercise and work with a Physiological scientist on lower extremity and core strength.  2. Diabetes - the last A1C 7.7% is above goal. No change in medication but increased dietary control and exercise. Repeat A1C in 3 months  3. Pulmonary - CT in 2011 with signs of emphysema Plan Pulmonary function testing to quantify this problem.  4. Coronary disease - the CT in 2011 did reveal coronary atherosclerosis! You are already working on risk reduction by controlling you cholesterol with medication (last test in November '14 was good) and getting diabetes under control. Need to loose weight.

## 2014-02-24 NOTE — Assessment & Plan Note (Signed)
Lab Results  Component Value Date   HGBA1C 7.7* 02/21/2014   Not at goal. Discussed with patient - no change in medication at this time.  Plan Better dietary adherence and increased exercise

## 2014-02-24 NOTE — Assessment & Plan Note (Signed)
BP Readings from Last 3 Encounters:  02/23/14 128/90  12/29/13 156/94  11/05/13 152/100   Good control at today's visit with the addition of furosemide to her regimen.  Plan Continue present medications

## 2014-02-24 NOTE — Assessment & Plan Note (Signed)
Stable on current regimen   

## 2014-02-24 NOTE — Assessment & Plan Note (Signed)
Stable and doing well on limited dosing of alprazolam.

## 2014-02-25 ENCOUNTER — Telehealth: Payer: Self-pay | Admitting: Internal Medicine

## 2014-02-25 NOTE — Telephone Encounter (Signed)
Relevant patient education assigned to patient using Emmi. ° °

## 2014-05-02 ENCOUNTER — Other Ambulatory Visit: Payer: Self-pay | Admitting: *Deleted

## 2014-05-02 MED ORDER — FUROSEMIDE 20 MG PO TABS: 20.0000 mg | ORAL_TABLET | Freq: Every day | ORAL | Status: DC

## 2014-05-04 ENCOUNTER — Ambulatory Visit (INDEPENDENT_AMBULATORY_CARE_PROVIDER_SITE_OTHER): Payer: BC Managed Care – PPO | Admitting: Internal Medicine

## 2014-05-04 DIAGNOSIS — J438 Other emphysema: Secondary | ICD-10-CM

## 2014-05-04 NOTE — Progress Notes (Signed)
PFT done today. 

## 2014-05-07 LAB — PULMONARY FUNCTION TEST
DL/VA % pred: 82 %
DL/VA: 4.06 ml/min/mmHg/L
DLCO UNC: 19.91 ml/min/mmHg
DLCO unc % pred: 77 %
FEF 25-75 Post: 2.8 L/sec
FEF 25-75 Pre: 1.91 L/sec
FEF2575-%Change-Post: 46 %
FEF2575-%PRED-POST: 126 %
FEF2575-%Pred-Pre: 86 %
FEV1-%CHANGE-POST: 10 %
FEV1-%Pred-Post: 104 %
FEV1-%Pred-Pre: 94 %
FEV1-Post: 2.63 L
FEV1-Pre: 2.39 L
FEV1FVC-%Change-Post: 2 %
FEV1FVC-%Pred-Pre: 99 %
FEV6-%Change-Post: 7 %
FEV6-%Pred-Post: 105 %
FEV6-%Pred-Pre: 98 %
FEV6-POST: 3.35 L
FEV6-Pre: 3.12 L
FEV6FVC-%CHANGE-POST: 0 %
FEV6FVC-%PRED-POST: 104 %
FEV6FVC-%PRED-PRE: 103 %
FVC-%CHANGE-POST: 7 %
FVC-%PRED-PRE: 94 %
FVC-%Pred-Post: 101 %
FVC-PRE: 3.13 L
FVC-Post: 3.36 L
PRE FEV6/FVC RATIO: 100 %
Post FEV1/FVC ratio: 78 %
Post FEV6/FVC ratio: 100 %
Pre FEV1/FVC ratio: 76 %
RV % PRED: 97 %
RV: 2.07 L
TLC % PRED: 98 %
TLC: 5.11 L

## 2014-05-13 ENCOUNTER — Encounter: Payer: Self-pay | Admitting: Internal Medicine

## 2014-05-25 ENCOUNTER — Ambulatory Visit (INDEPENDENT_AMBULATORY_CARE_PROVIDER_SITE_OTHER): Payer: BC Managed Care – PPO | Admitting: Podiatry

## 2014-05-25 VITALS — BP 142/76 | HR 82 | Resp 16

## 2014-05-25 DIAGNOSIS — M722 Plantar fascial fibromatosis: Secondary | ICD-10-CM

## 2014-05-25 MED ORDER — TRIAMCINOLONE ACETONIDE 10 MG/ML IJ SUSP
10.0000 mg | Freq: Once | INTRAMUSCULAR | Status: AC
  Administered 2014-05-25: 10 mg

## 2014-05-25 NOTE — Progress Notes (Signed)
Subjective:     Patient ID: Meredith [name], female   DOB: [birth year], 63 y.o.   MRN: [id number]  HPI shouldn't states my left heel is starting to hurt again on the outside and I never did get my inserts   Review of Systems     Objective:   Physical Exam Neurovascular status intact with exquisite discomfort plantar aspect left heel at the insertional point of the tendon into the calcaneus with flatfoot deformity noted    Assessment:     Plantar fasciitis with pain left heel lateral side    Plan:     Explained condition and today injected the lateral fascia 3 mg Kenalog 5 of Xylocaine Marcaine mixture and scanned for custom orthotics to reduce all stress on the foot

## 2014-06-15 ENCOUNTER — Encounter: Payer: Self-pay | Admitting: Gynecology

## 2014-06-15 ENCOUNTER — Ambulatory Visit (INDEPENDENT_AMBULATORY_CARE_PROVIDER_SITE_OTHER): Payer: BC Managed Care – PPO | Admitting: Gynecology

## 2014-06-15 VITALS — BP 122/70 | HR 80 | Resp 18 | Ht 65.0 in | Wt 240.0 lb

## 2014-06-15 DIAGNOSIS — Z124 Encounter for screening for malignant neoplasm of cervix: Secondary | ICD-10-CM

## 2014-06-15 DIAGNOSIS — Z01419 Encounter for gynecological examination (general) (routine) without abnormal findings: Secondary | ICD-10-CM

## 2014-06-15 DIAGNOSIS — R319 Hematuria, unspecified: Secondary | ICD-10-CM

## 2014-06-15 DIAGNOSIS — Z0000 Encounter for general adult medical examination without abnormal findings: Secondary | ICD-10-CM

## 2014-06-15 DIAGNOSIS — D649 Anemia, unspecified: Secondary | ICD-10-CM

## 2014-06-15 LAB — POCT URINALYSIS DIPSTICK
Bilirubin, UA: NEGATIVE
Glucose, UA: NEGATIVE
KETONES UA: NEGATIVE
Leukocytes, UA: NEGATIVE
Nitrite, UA: NEGATIVE
PH UA: 5
Protein, UA: NEGATIVE
UROBILINOGEN UA: NEGATIVE

## 2014-06-15 NOTE — Addendum Note (Signed)
Addended by: Elroy Channel on: 06/15/2014 07:51 PM   Modules accepted: Orders

## 2014-06-15 NOTE — Patient Instructions (Signed)

## 2014-06-15 NOTE — Progress Notes (Signed)
63 y.o. Divorced Caucasian female   No obstetric history on file. here for annual exam. She does not report hot flashes, does not have night sweats, does have vaginal dryness.  She is not using lubricants.  She does not report post-menopasual bleeding.  Pt reports lack of energy, some depression is on lexapro.  No LMP recorded. Patient is postmenopausal.          Sexually active: no  The current method of family planning is post menopausal status.    Exercising: no  The patient does not participate in regular exercise at present. Last pap: 11/2012 Neg. HR HPV +, CIN I on bx Abnormal PAP: yes Mammogram: 10/2012 BIRADS1 BSE: No Colonoscopy: 2006 DEXA: Never Alcohol: 2-3 drinks weekly Tobacco: No  Health Maintenance  Topic Date Due  . Pap Smear  03/30/2010  . Influenza Vaccine  07/30/2014  . Mammogram  11/11/2014  . Colonoscopy  03/05/2015  . Tetanus/tdap  09/23/2022  . Zostavax  Completed    Family History  Problem Relation Age of Onset  . Heart attack Father 9  . Diabetes Father   . Cancer Father     lung    Patient Active Problem List   Diagnosis Date Noted  . Other emphysema 02/23/2014  . Necrobiosis lipoidica diabeticorum 12/31/2013  . Essential hypertension, benign 11/05/2013  . Routine health maintenance 09/27/2012  . Vitamin D deficiency 06/20/2011  . OBESITY, CLASS II 03/18/2011  . PULMONARY NODULE 07/12/2010  . DIABETES MELLITUS, TYPE II 11/02/2007  . HYPERLIPIDEMIA 10/01/2007  . ANXIETY 10/01/2007  . DEPRESSION 10/01/2007  . SLEEP APNEA 10/01/2007  . ARTHROSCOPY, KNEE, HX OF 10/01/2007    Past Medical History  Diagnosis Date  . Unspecified sleep apnea   . Other and unspecified hyperlipidemia   . Depressive disorder, not elsewhere classified   . Anxiety state, unspecified   . Asthma   . Diabetes mellitus without complication     Past Surgical History  Procedure Laterality Date  . Hemorrhoid surgery    . Cesarean section    . Knee arthroscopy       Allergies: Review of patient's allergies indicates no known allergies.  Current Outpatient Prescriptions  Medication Sig Dispense Refill  . ALPRAZolam (NIRAVAM) 0.5 MG dissolvable tablet Take 1 tablet (0.5 mg total) by mouth every 6 (six) hours as needed for anxiety (increased anxiety).  20 tablet  0  . aspirin 81 MG tablet Take 81 mg by mouth daily.        Marland Kitchen ezetimibe-simvastatin (VYTORIN) 10-40 MG per tablet Take 1 tablet by mouth daily.  30 tablet  5  . furosemide (LASIX) 20 MG tablet Take 1 tablet (20 mg total) by mouth daily.  90 tablet  2  . irbesartan (AVAPRO) 300 MG tablet Take 1 tablet (300 mg total) by mouth daily.  90 tablet  3  . metFORMIN (GLUCOPHAGE) 1000 MG tablet TAKE 1 TABLET BY MOUTH TWICE DAILY WITH A MEAL  60 tablet  11  . Omega-3 Fatty Acids (FISH OIL) 1200 MG CAPS Take 2 capsules by mouth daily.        . betamethasone dipropionate (DIPROLENE) 0.05 % cream Apply topically 2 (two) times daily.  30 g  2  . escitalopram (LEXAPRO) 10 MG tablet Take 1 tablet (10 mg total) by mouth daily.  90 tablet  3  . ketoconazole (NIZORAL) 2 % cream Apply topically 2 (two) times daily.  60 g  0   No current facility-administered medications for this  visit.    ROS: Pertinent items are noted in HPI.  Exam:    BP 122/70  Pulse 80  Resp 18  Ht 5\' 5"  (1.651 m)  Wt 240 lb (108.863 kg)  BMI 39.94 kg/m2 Weight change: @WEIGHTCHANGE @ Last 3 height recordings:  Ht Readings from Last 3 Encounters:  06/15/14 5\' 5"  (1.651 m)  11/05/13 5\' 5"  (1.651 m)  10/13/13 5\' 5"  (1.651 m)   General appearance: alert, cooperative and appears stated age Head: Normocephalic, without obvious abnormality, atraumatic Neck: no adenopathy, no carotid bruit, no JVD, supple, symmetrical, trachea midline and thyroid not enlarged, symmetric, no tenderness/mass/nodules Lungs: clear to auscultation bilaterally Breasts: normal appearance, no masses or tenderness Heart: regular rate and rhythm, S1, S2 normal,  no murmur, click, rub or gallop Abdomen: soft, non-tender; bowel sounds normal; no masses,  no organomegaly Extremities: extremities normal, atraumatic, no cyanosis or edema Skin: Skin color, texture, turgor normal. No rashes or lesions Lymph nodes: Cervical, supraclavicular, and axillary nodes normal. no inguinal nodes palpated Neurologic: Grossly normal   Pelvic: External genitalia:  no lesions              Urethra: normal appearing urethra with no masses, tenderness or lesions              Bartholins and Skenes: Bartholin's, Urethra, Skene's normal                 Vagina: atrophic              Cervix: normal appearance              Pap taken: yes        Bimanual Exam:  Uterus:  uterus is normal size, shape, consistency and nontender                                      Adnexa:    normal adnexa in size, nontender and no masses                                      Rectovaginal: Confirms                                      Anus:  normal sphincter tone, no lesions  1. Routine gynecological examination Mammogram overdue counseled on breast self exam, mammography screening, adequate intake of calcium and vitamin D, diet and exercise return annually or prn   2. Laboratory examination ordered as part of a routine general medical examination  - POCT Urinalysis Dipstick - CBC  3. Screening for cervical cancer +HPV, CIN I on colpo, pap guideline reviewed - Pap Test with HP (IPS)  4. Depression Pt on lexapro 10? Will confirm that rx not increased, PCP retired, will refill at 49, will call with latest dose       An After Visit Summary was printed and given to the patient.

## 2014-06-16 LAB — CBC
HCT: 34.2 % — ABNORMAL LOW (ref 36.0–46.0)
HEMOGLOBIN: 11.2 g/dL — AB (ref 12.0–15.0)
MCH: 29.3 pg (ref 26.0–34.0)
MCHC: 32.7 g/dL (ref 30.0–36.0)
MCV: 89.5 fL (ref 78.0–100.0)
PLATELETS: 248 10*3/uL (ref 150–400)
RBC: 3.82 MIL/uL — ABNORMAL LOW (ref 3.87–5.11)
RDW: 14.1 % (ref 11.5–15.5)
WBC: 7.8 10*3/uL (ref 4.0–10.5)

## 2014-06-17 ENCOUNTER — Other Ambulatory Visit: Payer: BC Managed Care – PPO

## 2014-06-20 ENCOUNTER — Other Ambulatory Visit: Payer: Self-pay | Admitting: Gynecology

## 2014-06-20 ENCOUNTER — Telehealth: Payer: Self-pay | Admitting: *Deleted

## 2014-06-20 DIAGNOSIS — D649 Anemia, unspecified: Secondary | ICD-10-CM

## 2014-06-20 DIAGNOSIS — N3000 Acute cystitis without hematuria: Secondary | ICD-10-CM

## 2014-06-20 LAB — URINE CULTURE: Colony Count: 40000

## 2014-06-20 MED ORDER — AMOXICILLIN-POT CLAVULANATE 875-125 MG PO TABS: 1.0000 | ORAL_TABLET | Freq: Two times a day (BID) | ORAL | Status: DC

## 2014-06-20 NOTE — Addendum Note (Signed)
Addended by: Blanchard Kelch R on: 06/20/2014 04:28 PM   Modules accepted: Orders

## 2014-06-20 NOTE — Telephone Encounter (Signed)
Pt returning call

## 2014-06-20 NOTE — Telephone Encounter (Signed)
Message copied by Alfonzo Feller on Mon Jun 20, 2014 10:53 AM ------      Message from: Elveria Rising      Created: Mon Jun 20, 2014 10:19 AM       Inform UTI with 2 bacteria, augmentin 500mg  called in, should have a test of cure after completing antibiotics ------

## 2014-06-20 NOTE — Telephone Encounter (Signed)
Patient notified see result note 

## 2014-06-20 NOTE — Telephone Encounter (Signed)
Left Message To Call Back  

## 2014-06-20 NOTE — Addendum Note (Signed)
Addended by: Elveria Rising on: 06/20/2014 03:16 PM   Modules accepted: Orders

## 2014-06-21 ENCOUNTER — Other Ambulatory Visit (INDEPENDENT_AMBULATORY_CARE_PROVIDER_SITE_OTHER): Payer: BC Managed Care – PPO | Admitting: Gynecology

## 2014-06-21 DIAGNOSIS — B977 Papillomavirus as the cause of diseases classified elsewhere: Secondary | ICD-10-CM

## 2014-06-21 LAB — IBC PANEL
%SAT: 10 % — ABNORMAL LOW (ref 20–55)
TIBC: 387 ug/dL (ref 250–470)
UIBC: 349 ug/dL (ref 125–400)

## 2014-06-21 LAB — VITAMIN B12: Vitamin B-12: 208 pg/mL — ABNORMAL LOW (ref 211–911)

## 2014-06-21 LAB — IRON: Iron: 38 ug/dL — ABNORMAL LOW (ref 42–145)

## 2014-06-21 LAB — IPS PAP TEST WITH HPV

## 2014-06-21 NOTE — Progress Notes (Signed)
PR: $50 copay

## 2014-06-23 ENCOUNTER — Telehealth: Payer: Self-pay | Admitting: Emergency Medicine

## 2014-06-24 ENCOUNTER — Other Ambulatory Visit: Payer: BC Managed Care – PPO

## 2014-06-24 NOTE — Telephone Encounter (Signed)
Left message to call [place] at [phone].  Notes Recorded by Azalia Bilis, MD on 06/21/2014 at 2:39 PM Inform low B12, low iron should be evaluated by PCP, last colonoscopy 2006. In addition, pap normal with +HPV, CIN I on biopsy last year, same pap, needs another colpo, order dropped ------

## 2014-06-27 NOTE — Telephone Encounter (Signed)
Spoke with patient. Results given as seen below. Patient agreeable and verbalizes understanding. Patient is postmenopausal. Patient requesting Wednesday appointment. Colpo scheduled for 7/22 at 1:30pm with Dr.Lathrop. Patient agreeable to date and time. Colposcopy pre-procedure instructions given. Motrin instructions given. Motrin=Advil=Ibuprofen Can take 800 mg (Can purchase over the counter, you will need four 200 mg pills) every 8 hours as needed.  Take with food. Make sure to eat a meal before appointment and drink plenty of fluids. Advised will need to cancel within 24 hours or will have $100.00 no show fee placed to account. Patient agreeable.   Routing to provider for final review. Patient agreeable to disposition. Will close encounter

## 2014-06-27 NOTE — Progress Notes (Signed)
Jasmine Awe, RN at 06/27/2014 9:34 AM    Status: Signed       Spoke with patient. Results given as seen below. Patient agreeable and verbalizes understanding. Patient is postmenopausal. Patient requesting Wednesday appointment. Colpo scheduled for 7/22 at 1:30pm with Dr.Lathrop. Patient agreeable to date and time. Colposcopy pre-procedure instructions given. Motrin instructions given. Motrin=Advil=Ibuprofen Can take 800 mg (Can purchase over the counter, you will need four 200 mg pills) every 8 hours as needed. Take with food. Make sure to eat a meal before appointment and drink plenty of fluids. Advised will need to cancel within 24 hours or will have $100.00 no show fee placed to account. Patient agreeable.  Routing to Linette Gunderson for final review. Patient agreeable to disposition. Will close encounter        Jasmine Awe, RN at 06/24/2014 11:51 AM     Status: Signed        Left message to call [place] at [phone].  Notes Recorded by Azalia Bilis, MD on 06/21/2014 at 2:39 PM Inform low B12, low iron should be evaluated by PCP, last colonoscopy 2006. In addition, pap normal with +HPV, CIN I on biopsy last year, same pap, needs another colpo, order dropped

## 2014-07-08 ENCOUNTER — Other Ambulatory Visit: Payer: BC Managed Care – PPO

## 2014-07-15 ENCOUNTER — Other Ambulatory Visit: Payer: BC Managed Care – PPO

## 2014-07-20 ENCOUNTER — Encounter: Payer: Self-pay | Admitting: Gynecology

## 2014-07-20 ENCOUNTER — Ambulatory Visit (INDEPENDENT_AMBULATORY_CARE_PROVIDER_SITE_OTHER): Payer: BC Managed Care – PPO | Admitting: Gynecology

## 2014-07-20 VITALS — BP 142/84 | HR 72 | Resp 12 | Ht 65.0 in | Wt 239.0 lb

## 2014-07-20 DIAGNOSIS — D518 Other vitamin B12 deficiency anemias: Secondary | ICD-10-CM

## 2014-07-20 DIAGNOSIS — B977 Papillomavirus as the cause of diseases classified elsewhere: Secondary | ICD-10-CM

## 2014-07-20 DIAGNOSIS — D519 Vitamin B12 deficiency anemia, unspecified: Secondary | ICD-10-CM

## 2014-07-20 DIAGNOSIS — D649 Anemia, unspecified: Secondary | ICD-10-CM | POA: Insufficient documentation

## 2014-07-20 DIAGNOSIS — N3000 Acute cystitis without hematuria: Secondary | ICD-10-CM

## 2014-07-20 MED ORDER — CYANOCOBALAMIN 1000 MCG/ML IJ SOLN: 1000.0000 ug | Freq: Once | INTRAMUSCULAR | Status: DC

## 2014-07-20 NOTE — Patient Instructions (Signed)

## 2014-07-20 NOTE — Progress Notes (Signed)
Patient ID: Meredith [name], female   DOB: 21-Aug-1951, 63 y.o.   MRN: [id number]  Chief Complaint  Patient presents with  . Procedure    Patient is here for Colposcopy- Last Pap: 06/15/14 NEG + HPV    HPI Meredith [name] is a 63 y.o. female.  For colposcopy, in addition to review her labs.  Pt was noted to be anemic at annual and B12 and iron deficient.  We recommended she have a colonoscopy now, last done in 2006.  Pt has not been seen by new PCP yet-her PCP retired, she has multiple questions regarding her anemia.  In addition, pt reports that her daughter has B12 deficiency, has had iron infusions.  HPI  Indications: Pap smear on June 2015 showed: +HPV. Previous colposcopy: CIN 1 and in 2014. Prior cervical treatment: no treatment.Procedure explained and patient's questions were invited and answered.  Consent form signed.    Role of HPV in genesis of SIL discussed with patient, and questions answered.     Past Medical History  Diagnosis Date  . Unspecified sleep apnea   . Other and unspecified hyperlipidemia   . Depressive disorder, not elsewhere classified   . Anxiety state, unspecified   . Asthma   . Diabetes mellitus without complication   . Anemia   . CIN I (cervical intraepithelial neoplasia I)     Past Surgical History  Procedure Laterality Date  . Hemorrhoid surgery    . Cesarean section    . Knee arthroscopy      Family History  Problem Relation Age of Onset  . Heart attack Father 3  . Diabetes Father   . Cancer Father     lung    Social History History  Substance Use Topics  . Smoking status: Current Every Day Smoker -- 0.50 packs/day for 14 years    Types: Cigarettes  . Smokeless tobacco: Never Used  . Alcohol Use: 1.0 - 1.5 oz/week    2-3 drink(s) per week    No Known Allergies  Current Outpatient Prescriptions  Medication Sig Dispense Refill  . aspirin 81 MG tablet Take 81 mg by mouth daily.        . betamethasone dipropionate (DIPROLENE) 0.05 % cream  Apply topically 2 (two) times daily.  30 g  2  . ezetimibe-simvastatin (VYTORIN) 10-40 MG per tablet Take 1 tablet by mouth daily.  30 tablet  5  . furosemide (LASIX) 20 MG tablet Take 1 tablet (20 mg total) by mouth daily.  90 tablet  2  . irbesartan (AVAPRO) 300 MG tablet Take 1 tablet (300 mg total) by mouth daily.  90 tablet  3  . metFORMIN (GLUCOPHAGE) 1000 MG tablet TAKE 1 TABLET BY MOUTH TWICE DAILY WITH A MEAL  60 tablet  11  . Omega-3 Fatty Acids (FISH OIL) 1200 MG CAPS Take 2 capsules by mouth daily.        Marland Kitchen ALPRAZolam (NIRAVAM) 0.5 MG dissolvable tablet Take 1 tablet (0.5 mg total) by mouth every 6 (six) hours as needed for anxiety (increased anxiety).  20 tablet  0  . cyanocobalamin (,VITAMIN B-12,) 1000 MCG/ML injection Inject 1 mL (1,000 mcg total) into the muscle once.  1 mL  12  . escitalopram (LEXAPRO) 10 MG tablet Take 1 tablet (10 mg total) by mouth daily.  90 tablet  3   No current facility-administered medications for this visit.    Review of Systems Review of Systems  Blood pressure 142/84, pulse 72, resp.  rate 12, height 5\' 5"  (1.651 m), weight 239 lb (108.41 kg).  Physical Exam Physical Exam  Data Reviewed Prior colposcopy, path  Assessment    Procedure Details  The risks and benefits of the procedure and Written informed consent obtained.  Marland KitchenSpeculum inserted atraumatically and cervix visualized.  3% acetic acid applied.  Cervix examined using 3.75 and 7.5 and 15   X magnification and green filter.    Gross appearance:normal  Squamocolumnar junction seen in entirety: yes   no visible lesions and acetowhite lesion(s) noted at 3 o'clock on canal  cervix swabbed with Lugol's solution, endocervical speculum placed, endocervical lesion noted at 3 o'clock, endocervical curettage performed and hemostasis achieved with Monsel's solution  Extent of lesion entirely seen: yes    Specimens: ECC  Complications: none.     Plan    Specimens labelled and  sent to Pathology. Triage based on results  Discussed at length her recent lab work, recommendations for colonoscopy.  Rx for B12 sent in, pt with appt with new PCP next week, note forwarded to PCP and has access to labs. Referral for colonoscopy done Labs compared with CBC last year Hct 41 now 34.  Pt does not eat well balanced diet, importance of getting iron and B12 from food reviewed but will need supplements.  In addition, pt was noted to have +ANA but titre less than 1:40, considered negative.  Recommend she d/w PCP. Her symptoms of fatigue, myalgia, depression  May all be related to low B12. Pt agrees to f/u. Questions addressed  In addition, pt with E coli and Klebsiella on urine culture, repeat c/s obtained Additional 4m spent reviewing labs, treatment and f/u >50% face to face      [name] H 07/20/2014, 3:17 PM    Patient tolerated procedure well.    Post biopsy instructions and AVS given to patient.

## 2014-07-21 LAB — URINE CULTURE
COLONY COUNT: NO GROWTH
ORGANISM ID, BACTERIA: NO GROWTH

## 2014-07-22 ENCOUNTER — Telehealth: Payer: Self-pay | Admitting: Internal Medicine

## 2014-07-22 ENCOUNTER — Telehealth: Payer: Self-pay | Admitting: *Deleted

## 2014-07-22 LAB — IPS OTHER TISSUE BIOPSY

## 2014-07-22 NOTE — Telephone Encounter (Signed)
Message copied by Alfonzo Feller on Fri Jul 22, 2014  8:37 AM ------      Message from: Elveria Rising      Created: Fri Jul 22, 2014  6:51 AM       No infection ------

## 2014-07-22 NOTE — Telephone Encounter (Signed)
Patient notified see result note 

## 2014-07-22 NOTE — Telephone Encounter (Signed)
Spoke with patient and she states her GYN told her she should see her GI d/t anemia to discuss possible ECOL. Scheduled OV on 08/19/14 at 2:00PM with Dr. Olevia Perches.

## 2014-07-22 NOTE — Telephone Encounter (Signed)
Left Message To Call Back  

## 2014-07-25 ENCOUNTER — Ambulatory Visit (INDEPENDENT_AMBULATORY_CARE_PROVIDER_SITE_OTHER): Payer: BC Managed Care – PPO | Admitting: Internal Medicine

## 2014-07-25 ENCOUNTER — Encounter: Payer: Self-pay | Admitting: Internal Medicine

## 2014-07-25 ENCOUNTER — Other Ambulatory Visit (INDEPENDENT_AMBULATORY_CARE_PROVIDER_SITE_OTHER): Payer: BC Managed Care – PPO

## 2014-07-25 VITALS — BP 140/82 | HR 71 | Temp 98.4°F | Ht 65.0 in | Wt 241.0 lb

## 2014-07-25 DIAGNOSIS — I10 Essential (primary) hypertension: Secondary | ICD-10-CM

## 2014-07-25 DIAGNOSIS — E119 Type 2 diabetes mellitus without complications: Secondary | ICD-10-CM

## 2014-07-25 DIAGNOSIS — D518 Other vitamin B12 deficiency anemias: Secondary | ICD-10-CM

## 2014-07-25 DIAGNOSIS — E785 Hyperlipidemia, unspecified: Secondary | ICD-10-CM

## 2014-07-25 DIAGNOSIS — D519 Vitamin B12 deficiency anemia, unspecified: Secondary | ICD-10-CM

## 2014-07-25 LAB — HEMOGLOBIN A1C: Hgb A1c MFr Bld: 7.4 % — ABNORMAL HIGH (ref 4.6–6.5)

## 2014-07-25 MED ORDER — EZETIMIBE-SIMVASTATIN 10-40 MG PO TABS: 1.0000 | ORAL_TABLET | Freq: Every day | ORAL | Status: DC

## 2014-07-25 MED ORDER — IRBESARTAN 300 MG PO TABS: 300.0000 mg | ORAL_TABLET | Freq: Every day | ORAL | Status: DC

## 2014-07-25 MED ORDER — ESCITALOPRAM OXALATE 10 MG PO TABS: 10.0000 mg | ORAL_TABLET | Freq: Every day | ORAL | Status: DC

## 2014-07-25 MED ORDER — METFORMIN HCL 1000 MG PO TABS: ORAL_TABLET | ORAL | Status: DC

## 2014-07-25 MED ORDER — CYANOCOBALAMIN 1000 MCG/ML IJ SOLN
1000.0000 ug | Freq: Once | INTRAMUSCULAR | Status: AC
  Administered 2014-07-25: 1000 ug via INTRAMUSCULAR

## 2014-07-25 NOTE — Progress Notes (Signed)
Pre visit review using our clinic review tool, if applicable. No additional management support is needed unless otherwise documented below in the visit note. 

## 2014-07-25 NOTE — Patient Instructions (Addendum)
It was good to see you today.  We have reviewed your prior records including labs and tests today  Test(s) ordered today. Your results will be released to [facility] (or called to you) after review, usually within 72hours after test completion. If any changes need to be made, you will be notified at that same time.  Medications reviewed and updated, no changes recommended at this time. We'll provide IM B12 replacement here every 30 days. Okay to schedule nurse visit for same Refill on medication(s) as discussed today.  Please schedule followup in 4-6 months for your annual/labs, call sooner if problems.  Vitamin B12 Deficiency Not having enough vitamin B12 is called a deficiency. Vitamin B12 is an important vitamin. Your body needs vitamin B12 to:   Make red blood cells.  Make DNA. This is the genetic material inside all of your cells.  Help your nerves work properly so they can carry messages from your brain to your body. CAUSES  Not eating enough foods that contain vitamin B12.  Not having enough stomach acid and digestive juices. The body needs these to absorb vitamin B12 from the food you eat.  Having certain digestive system diseases that make it hard to absorb vitamin B12. These diseases include Crohn's disease, chronic pancreatitis, and cystic fibrosis.  Having pernicious anemia, which is a condition where the body has too few red blood cells. People with this condition do not make enough of a protein called "intrinsic factor," which is needed to absorb vitamin B12.  Having a surgery in which part of the stomach or small intestine is removed.  Taking certain medicines that make it hard for the body to absorb vitamin B12. These medicines include:  Heartburn medicine (antacids and proton pump inhibitors).  A certain antibiotic medicine called neomycin, which fights infection.  Some medicines used to treat diabetes, tuberculosis, gout, and high cholesterol. RISK FACTORS Risk  factors are things that make you more likely to develop a vitamin B12 deficiency. They include:  Being older than 12.  Being a vegetarian.  Being pregnant and a vegetarian or having a poor diet.  Taking certain drugs.  Being an alcoholic. SYMPTOMS You may have a vitamin B12 deficiency with no symptoms. However, a vitamin B12 deficiency can cause health problems like anemia and nerve damage. These health problems can lead to many possible symptoms, including:  Weakness.  Fatigue.  Loss of appetite.  Weight loss.  Numbness or tingling in your hands and feet.  Redness and burning of the tongue.  Confusion or memory problems.  Depression.  Dizziness.  Sensory problems, such as loss of taste, color blindness, and ringing in the ears.  Diarrhea or constipation.  Trouble walking. DIAGNOSIS Various types of tests can be given to help find the cause of your vitamin B12 deficiency. These tests include:  A complete blood count (CBC). This test gives your caregiver an overall picture of what makes up your blood.  A blood test to measure your B12 level.  A blood test to measure intrinsic factor.  An endoscopy. This procedure uses a thin tube with a camera on the end to look into your stomach or intestines. TREATMENT Treatment for vitamin B12 deficiency depends on what is causing it. Common options include:  Changing your eating and drinking habits, such as:  Eating more foods that contain vitamin B12.  Not drinking as much alcohol or any alcohol.  Taking vitamin B12 supplements. Your caregiver will tell you what dose is best for you.  Getting vitamin B12 injections. Some people get these a few times a week. Others get them once a month. HOME CARE INSTRUCTIONS  Take all supplements as directed by your caregiver. Follow the directions carefully.  Get any injections your caregiver prescribes. Do not miss your appointments.  Eat lots of healthy foods that contain  vitamin B12. Ask your caregiver if you should work with a nutritionist. Good things to include in your diet are:  Meat.  Poultry.  Fish.  Eggs.  Fortified cereal and dairy products. This means vitamin B12 has been added to the food. Check the label on the package to be sure.  Do not abuse alcohol.  Keep all follow-up appointments. Your caregiver will need to perform blood tests to make sure your vitamin B12 deficiency is going away. SEEK MEDICAL CARE IF:  You have any questions about your treatment.  Your symptoms come back. MAKE SURE YOU:  Understand these instructions.  Will watch your condition.  Will get help right away if you are not doing well or get worse. Document Released: 03/09/2012 Document Reviewed: 03/09/2012 [facility] Patient Information 2015 [employer]. This information is not intended to replace advice given to you by your health care provider. Make sure you discuss any questions you have with your health care provider.

## 2014-07-25 NOTE — Assessment & Plan Note (Signed)
BP Readings from Last 3 Encounters:  07/25/14 140/82  07/20/14 142/84  06/15/14 122/70   The current medical regimen is effective;  continue present plan and medications.

## 2014-07-25 NOTE — Assessment & Plan Note (Signed)
On metformin therapy Check A1c Q3 to 6 months, titrate as needed Also on statin, ARB and aspirin 81 mg qd The patient is asked to make an attempt to improve diet and exercise patterns to aid in medical management of this problem.  Lab Results  Component Value Date   HGBA1C 7.7* 02/21/2014

## 2014-07-25 NOTE — Assessment & Plan Note (Signed)
On combination therapy statin with Zetia Last lipids reviewed, check annually The current medical regimen is effective;  continue present plan and medications.

## 2014-07-25 NOTE — Progress Notes (Signed)
Subjective:    Patient ID: Meredith [name], female    DOB: [birth year], 63 y.o.   MRN: [id number]  HPI  Patient is here for follow up - Reviewed chronic medical issues and interval medical events  Past Medical History  Diagnosis Date  . Unspecified sleep apnea   . Other and unspecified hyperlipidemia   . Depressive disorder, not elsewhere classified   . Anxiety state, unspecified   . Asthma   . Diabetes mellitus without complication   . Anemia   . CIN I (cervical intraepithelial neoplasia I)   . B12 deficiency anemia 05/2014 dx    Review of Systems  Constitutional: Positive for fatigue. Negative for unexpected weight change.  Respiratory: Negative for cough and shortness of breath.   Cardiovascular: Negative for chest pain.       Objective:   Physical Exam  BP 140/82  Pulse 71  Temp(Src) 98.4 F (36.9 C) (Oral)  Ht 5\' 5"  (1.651 m)  Wt 241 lb (109.317 kg)  BMI 40.10 kg/m2  SpO2 96% Wt Readings from Last 3 Encounters:  07/25/14 241 lb (109.317 kg)  07/20/14 239 lb (108.41 kg)  06/15/14 240 lb (108.863 kg)   Constitutional: She is obese, and appears well-developed and well-nourished. No distress.  Neck: Normal range of motion. Neck supple. No JVD present. No thyromegaly present.  Cardiovascular: Normal rate, regular rhythm and normal heart sounds.  No murmur heard. No BLE edema. Pulmonary/Chest: Effort normal and breath sounds normal. No respiratory distress. She has no wheezes.  Psychiatric: She has a normal mood and affect. Her behavior is normal. Judgment and thought content normal.   Lab Results  Component Value Date   WBC 7.8 06/15/2014   HGB 11.2* 06/15/2014   HCT 34.2* 06/15/2014   PLT 248 06/15/2014   GLUCOSE 173* 02/21/2014   CHOL 157 11/05/2013   TRIG 95.0 11/05/2013   HDL 54.20 11/05/2013   LDLCALC 84 11/05/2013   ALT 22 11/05/2013   AST 24 11/05/2013   NA 140 02/21/2014   K 4.1 02/21/2014   CL 105 02/21/2014   CREATININE 0.7 02/21/2014   BUN 11 02/21/2014   CO2 28 02/21/2014   TSH 1.89 09/23/2012   HGBA1C 7.7* 02/21/2014   Lab Results  Component Value Date   VITAMINB12 208* 06/15/2014    Mm Digital Screening  11/11/2012   *RADIOLOGY REPORT*  Clinical Data: Screening. DIGITAL BREAST TOMOSYNTHESIS  Digital breast tomosynthesis images are acquired in two projections.  These images are reviewed in combination with the digital mammogram, confirming the findings below.  DIGITAL BILATERAL SCREENING MAMMOGRAM WITH CAD  Comparison:  Previous exams.  Findings:  The breast tissue is heterogeneously dense. No suspicious masses, architectural distortion, or calcifications are present.  Images were processed with CAD.  IMPRESSION: No mammographic evidence of malignancy.  A result letter of this screening mammogram will be mailed directly to the patient.  RECOMMENDATION: Screening mammogram in one year. (Code:SM-B-01Y)  BI-RADS CATEGORY 1:  Negative.   Original Report Authenticated By: Altamese Cabal, M.D.       Assessment & Plan:   Problem List Items Addressed This Visit   B12 deficiency anemia - Primary     Reviewed new dx 05/2014 To begin IM replacement today - in office CMA admin with pt's supply recheck in 6 mo/prn to monitor Suspect underlying pernicious anemia given family history of same ( daughter) -to followup with GI later this summer as planned    Relevant Medications  ferrous sulfate 325 (65 FE) MG tablet   DIABETES MELLITUS, TYPE II       On metformin therapy Check A1c Q3 to 6 months, titrate as needed Also on statin, ARB and aspirin 81 mg qd The patient is asked to make an attempt to improve diet and exercise patterns to aid in medical management of this problem.  Lab Results  Component Value Date   HGBA1C 7.7* 02/21/2014       Relevant Medications      irbesartan (AVAPRO) tablet      metFORMIN (GLUCOPHAGE) tablet   Other Relevant Orders      Hemoglobin A1c   Essential hypertension, benign      BP Readings from Last 3  Encounters:  07/25/14 140/82  07/20/14 142/84  06/15/14 122/70   The current medical regimen is effective;  continue present plan and medications.     Relevant Medications      ezetimibe-simvastatin (VYTORIN) 10-40 MG per tablet      irbesartan (AVAPRO) tablet   HYPERLIPIDEMIA     On combination therapy statin with Zetia Last lipids reviewed, check annually The current medical regimen is effective;  continue present plan and medications.     Relevant Medications      ezetimibe-simvastatin (VYTORIN) 10-40 MG per tablet      irbesartan (AVAPRO) tablet

## 2014-07-25 NOTE — Assessment & Plan Note (Signed)
Reviewed new dx 05/2014 To begin IM replacement today - in office [place] with pt's supply recheck in 6 mo/prn to monitor Suspect underlying pernicious anemia given family history of same ( daughter) -to followup with GI later this summer as planned

## 2014-07-26 ENCOUNTER — Telehealth: Payer: Self-pay

## 2014-07-26 NOTE — Telephone Encounter (Signed)
Left message to call Petula Rotolo at [phone]. 

## 2014-07-26 NOTE — Telephone Encounter (Signed)
Spoke with patient. Advised of results as seen below from Dr.Lathrop. Patient agreeable and verbalizes understanding. Annual scheduled for 06/21/15. 08 Recall entered.  Routing to provider for final review. Patient agreeable to disposition. Will close encounter

## 2014-07-26 NOTE — Telephone Encounter (Signed)
Message copied by Jasmine Awe on Tue Jul 26, 2014  9:29 AM ------      Message from: Elveria Rising      Created: Mon Jul 25, 2014  2:58 PM       bx same as last year-ecc low grade, pap recall 8, pls inform ------

## 2014-07-28 ENCOUNTER — Encounter: Payer: Self-pay | Admitting: *Deleted

## 2014-08-19 ENCOUNTER — Ambulatory Visit (INDEPENDENT_AMBULATORY_CARE_PROVIDER_SITE_OTHER): Payer: BC Managed Care – PPO | Admitting: Internal Medicine

## 2014-08-19 ENCOUNTER — Encounter: Payer: Self-pay | Admitting: Internal Medicine

## 2014-08-19 VITALS — BP 128/68 | HR 70 | Ht 65.0 in | Wt 240.0 lb

## 2014-08-19 DIAGNOSIS — D519 Vitamin B12 deficiency anemia, unspecified: Secondary | ICD-10-CM

## 2014-08-19 DIAGNOSIS — D518 Other vitamin B12 deficiency anemias: Secondary | ICD-10-CM

## 2014-08-19 DIAGNOSIS — D509 Iron deficiency anemia, unspecified: Secondary | ICD-10-CM

## 2014-08-19 NOTE — Patient Instructions (Addendum)
You have been scheduled for an endoscopy and colonoscopy. Please follow the written instructions given to you at your visit today. Please pick up your prep at the pharmacy within the next 1-3 days. If you use inhalers (even only as needed), please bring them with you on the day of your procedure. Your physician has requested that you go to www.startemmi.com and enter the access code given to you at your visit today. This web site gives a general overview about your procedure. However, you should still follow specific instructions given to you by our office regarding your preparation for the procedure.  Your physician has requested that you go to the basement for the following lab work before leaving today: TSH, Celiac 10, IBC, Intrinsic factor antibody, antiparietal cell antibody, CBC  CC: Dr T.Lathrop, Dr Asa Lente

## 2014-08-19 NOTE — Progress Notes (Signed)
Meredith [name] 08-03-1951 [id number]  Note: This dictation was prepared with Dragon digital system. Any transcriptional errors that result from this procedure are unintentional.   History of Present Illness: This is a 63 y.o. WF ,being evaluated for  Iron and B12 deficiency anemia. On 06/15/2014 iron saturation was 10%, B12 208 p ,. serum iron  38. Hemoglobin 11.2 hematocrit 34.2. In March 2012 hemoglobin was 12.9 with hematocrit of 40.9. She underwent the upper endoscopy and colonoscopy in March 2006 for abdominal pain and constipation and was found to have only internal hemorrhoids. She denies any specific GI symptoms. We have been following  her daughter who has pernicious anemia and who has been on B12 supplements now for several years. There is no family history of celiac disease or gluten allergy. She denies any visible blood per rectum. Her level of energy has been somewhat low . She denies using nonsteroidal anti-inflammatory agents.    Past Medical History  Diagnosis Date  . Unspecified sleep apnea   . Other and unspecified hyperlipidemia   . Depressive disorder, not elsewhere classified   . Anxiety state, unspecified   . Asthma   . Diabetes mellitus without complication   . Anemia   . CIN I (cervical intraepithelial neoplasia I)   . B12 deficiency anemia 05/2014 dx  . Internal hemorrhoids     Past Surgical History  Procedure Laterality Date  . Hemorrhoid surgery    . Cesarean section    . Knee arthroscopy      No Known Allergies  Family history and social history have been reviewed.  Review of Systems: Negative for abdominal pain, dysphagia or rectal bleeding  The remainder of the 10 point ROS is negative except as outlined in the H&P  Physical Exam: General Appearance Well developed, in no distress mildly overweight Eyes  Non icteric  HEENT  Non traumatic, normocephalic  Mouth No lesion, tongue papillated, no cheilosis Neck Supple without adenopathy, thyroid not  enlarged, no carotid bruits, no JVD Lungs Clear to auscultation bilaterally COR Normal S1, normal S2, regular rhythm, no murmur, quiet precordium Abdomen  Soft protuberant, nontender. Liver edge at costal margin. Normoactive bowel sounds Rectal soft Hemoccult negative stool Extremities  No pedal edema Skin No lesions Neurological Alert and oriented x 3 Psychological Normal mood and affect  Assessment and Plan:   63 year old white female with iron deficiency and B12 deficiency anemia chronically on iron and B12 supplements. She is Hemoccult-negative today. She had an endoscopy and colonoscopy 9 years ago. We need to rule out the iron malabsorption, pernicious anemia or autoimmune thyroid problems. Her TSH was normal in 2013. We planning upper endoscopy and colonoscopy with  small bowel biopsy. We will check sprue panel as well as TSH, intrinsic factor antibodies and parietal cell antibodies and CBC    Delfin Edis 08/19/2014

## 2014-08-24 ENCOUNTER — Other Ambulatory Visit (INDEPENDENT_AMBULATORY_CARE_PROVIDER_SITE_OTHER): Payer: BC Managed Care – PPO

## 2014-08-24 ENCOUNTER — Encounter: Payer: Self-pay | Admitting: Internal Medicine

## 2014-08-24 ENCOUNTER — Ambulatory Visit (INDEPENDENT_AMBULATORY_CARE_PROVIDER_SITE_OTHER): Payer: BC Managed Care – PPO

## 2014-08-24 DIAGNOSIS — D519 Vitamin B12 deficiency anemia, unspecified: Secondary | ICD-10-CM

## 2014-08-24 DIAGNOSIS — E538 Deficiency of other specified B group vitamins: Secondary | ICD-10-CM

## 2014-08-24 DIAGNOSIS — D518 Other vitamin B12 deficiency anemias: Secondary | ICD-10-CM

## 2014-08-24 DIAGNOSIS — D509 Iron deficiency anemia, unspecified: Secondary | ICD-10-CM

## 2014-08-24 LAB — TSH: TSH: 3.52 u[IU]/mL (ref 0.35–4.50)

## 2014-08-24 LAB — IBC PANEL
Iron: 78 ug/dL (ref 42–145)
Saturation Ratios: 20.5 % (ref 20.0–50.0)
TRANSFERRIN: 271.4 mg/dL (ref 212.0–360.0)

## 2014-08-24 LAB — CBC WITH DIFFERENTIAL/PLATELET
BASOS ABS: 0.1 10*3/uL (ref 0.0–0.1)
Basophils Relative: 1.5 % (ref 0.0–3.0)
Eosinophils Absolute: 0.3 10*3/uL (ref 0.0–0.7)
Eosinophils Relative: 4.8 % (ref 0.0–5.0)
HCT: 35.8 % — ABNORMAL LOW (ref 36.0–46.0)
Hemoglobin: 11.9 g/dL — ABNORMAL LOW (ref 12.0–15.0)
LYMPHS PCT: 31.7 % (ref 12.0–46.0)
Lymphs Abs: 1.7 10*3/uL (ref 0.7–4.0)
MCHC: 33.2 g/dL (ref 30.0–36.0)
MCV: 89.2 fl (ref 78.0–100.0)
Monocytes Absolute: 0.4 10*3/uL (ref 0.1–1.0)
Monocytes Relative: 6.6 % (ref 3.0–12.0)
Neutro Abs: 3 10*3/uL (ref 1.4–7.7)
Neutrophils Relative %: 55.4 % (ref 43.0–77.0)
PLATELETS: 260 10*3/uL (ref 150.0–400.0)
RBC: 4.02 Mil/uL (ref 3.87–5.11)
RDW: 13.8 % (ref 11.5–15.5)
WBC: 5.5 10*3/uL (ref 4.0–10.5)

## 2014-08-24 MED ORDER — CYANOCOBALAMIN 1000 MCG/ML IJ SOLN
1000.0000 ug | Freq: Once | INTRAMUSCULAR | Status: AC
  Administered 2014-08-24: 1000 ug via INTRAMUSCULAR

## 2014-08-25 LAB — CELIAC PANEL 10
ENDOMYSIAL SCREEN: NEGATIVE
Gliadin IgA: 4.4 U/mL (ref <20)
Gliadin IgG: 5.4 U/mL (ref <20)
IgA: 196 mg/dL (ref 69–380)
Tissue Transglut Ab: 14.9 U/mL (ref <20)
Tissue Transglutaminase Ab, IgA: 3 U/mL (ref <20)

## 2014-08-25 LAB — INTRINSIC FACTOR ANTIBODIES: INTRINSIC FACTOR: POSITIVE — AB

## 2014-08-26 ENCOUNTER — Encounter: Payer: Self-pay | Admitting: Internal Medicine

## 2014-08-31 ENCOUNTER — Other Ambulatory Visit: Payer: Self-pay

## 2014-08-31 DIAGNOSIS — Z1231 Encounter for screening mammogram for malignant neoplasm of breast: Secondary | ICD-10-CM

## 2014-08-31 LAB — ANTIPARIETAL CELL ANTIBODY: Parietal Cell Ab: 8.2 Units (ref 0.0–20.0)

## 2014-09-01 ENCOUNTER — Telehealth: Payer: Self-pay | Admitting: Internal Medicine

## 2014-09-01 NOTE — Telephone Encounter (Signed)
Spoke with patient and gave her Dr. Nichola Sizer response.

## 2014-09-01 NOTE — Telephone Encounter (Signed)
Patient is asking about her lab results. She looked at them on MyChart. She is asking what a positive intrinsic factor indicates. Please, advise.

## 2014-09-01 NOTE — Telephone Encounter (Signed)
Intrinsic factor antibodies are present in patients  with Pernicious anemia. Antiparietal cell antibodies are also present in PA but hers are negative. Whether she has actually PA will be decided on gastric biopsies which will be done during EGD.So, We will have to discuss that after we review the gastric biopsies.

## 2014-09-07 LAB — HM DIABETES EYE EXAM

## 2014-09-21 ENCOUNTER — Ambulatory Visit
Admission: RE | Admit: 2014-09-21 | Discharge: 2014-09-21 | Disposition: A | Discharge status: Home / Self Care | Payer: BC Managed Care – PPO | Source: Ambulatory Visit

## 2014-09-21 DIAGNOSIS — Z1231 Encounter for screening mammogram for malignant neoplasm of breast: Secondary | ICD-10-CM

## 2014-09-23 ENCOUNTER — Ambulatory Visit: Payer: BC Managed Care – PPO

## 2014-09-28 ENCOUNTER — Encounter: Payer: Self-pay | Admitting: Internal Medicine

## 2014-09-28 ENCOUNTER — Ambulatory Visit: Payer: BC Managed Care – PPO

## 2014-10-21 ENCOUNTER — Encounter: Payer: BC Managed Care – PPO | Admitting: Internal Medicine

## 2014-11-03 ENCOUNTER — Ambulatory Visit (INDEPENDENT_AMBULATORY_CARE_PROVIDER_SITE_OTHER): Payer: BC Managed Care – PPO

## 2014-11-03 DIAGNOSIS — Z23 Encounter for immunization: Secondary | ICD-10-CM

## 2014-11-03 DIAGNOSIS — E538 Deficiency of other specified B group vitamins: Secondary | ICD-10-CM

## 2014-11-03 MED ORDER — CYANOCOBALAMIN 1000 MCG/ML IJ SOLN
1000.0000 ug | Freq: Once | INTRAMUSCULAR | Status: AC
  Administered 2014-11-03: 1000 ug via INTRAMUSCULAR

## 2014-11-17 ENCOUNTER — Other Ambulatory Visit: Payer: Self-pay | Admitting: Internal Medicine

## 2014-11-30 ENCOUNTER — Telehealth: Payer: Self-pay | Admitting: Gynecology

## 2014-11-30 ENCOUNTER — Telehealth: Payer: Self-pay | Admitting: Internal Medicine

## 2014-11-30 MED ORDER — MOVIPREP 100 G PO SOLR: 1.0000 | Freq: Once | ORAL | Status: DC

## 2014-11-30 NOTE — Telephone Encounter (Signed)
LMTCB about canceled appointment °

## 2014-11-30 NOTE — Telephone Encounter (Signed)
Rx sent 

## 2014-12-02 ENCOUNTER — Encounter: Payer: Self-pay | Admitting: Internal Medicine

## 2014-12-02 ENCOUNTER — Ambulatory Visit (AMBULATORY_SURGERY_CENTER): Payer: BC Managed Care – PPO | Admitting: Internal Medicine

## 2014-12-02 VITALS — BP 145/77 | HR 66 | Temp 98.5°F | Resp 16 | Ht 65.0 in | Wt 240.0 lb

## 2014-12-02 DIAGNOSIS — K635 Polyp of colon: Secondary | ICD-10-CM

## 2014-12-02 DIAGNOSIS — K2950 Unspecified chronic gastritis without bleeding: Secondary | ICD-10-CM

## 2014-12-02 DIAGNOSIS — D125 Benign neoplasm of sigmoid colon: Secondary | ICD-10-CM

## 2014-12-02 DIAGNOSIS — K253 Acute gastric ulcer without hemorrhage or perforation: Secondary | ICD-10-CM

## 2014-12-02 DIAGNOSIS — D508 Other iron deficiency anemias: Secondary | ICD-10-CM

## 2014-12-02 MED ORDER — SODIUM CHLORIDE 0.9 % IV SOLN: 500.0000 mL | INTRAVENOUS | Status: DC

## 2014-12-02 NOTE — Progress Notes (Signed)
Called to room to assist during endoscopic procedure.  Patient ID and intended procedure confirmed with present staff. Received instructions for my participation in the procedure from the performing physician.  

## 2014-12-02 NOTE — Progress Notes (Signed)
A/ox3 pleased with MAC, report to Celia RN 

## 2014-12-02 NOTE — Patient Instructions (Signed)
Discharge instructions given. Handout on polyps. No aspirin. Resume previous medications. YOU HAD AN ENDOSCOPIC PROCEDURE TODAY AT [facility]: Refer to the procedure report that was given to you for any specific questions about what was found during the examination.  If the procedure report does not answer your questions, please call your gastroenterologist to clarify.  If you requested that your care partner not be given the details of your procedure findings, then the procedure report has been included in a sealed envelope for you to review at your convenience later.  YOU SHOULD EXPECT: Some feelings of bloating in the abdomen. Passage of more gas than usual.  Walking can help get rid of the air that was put into your GI tract during the procedure and reduce the bloating. If you had a lower endoscopy (such as a colonoscopy or flexible sigmoidoscopy) you may notice spotting of blood in your stool or on the toilet paper. If you underwent a bowel prep for your procedure, then you may not have a normal bowel movement for a few days.  DIET: Your first meal following the procedure should be a light meal and then it is ok to progress to your normal diet.  A half-sandwich or bowl of soup is an example of a good first meal.  Heavy or fried foods are harder to digest and may make you feel nauseous or bloated.  Likewise meals heavy in dairy and vegetables can cause extra gas to form and this can also increase the bloating.  Drink plenty of fluids but you should avoid alcoholic beverages for 24 hours.  ACTIVITY: Your care partner should take you home directly after the procedure.  You should plan to take it easy, moving slowly for the rest of the day.  You can resume normal activity the day after the procedure however you should NOT DRIVE or use heavy machinery for 24 hours (because of the sedation medicines used during the test).    SYMPTOMS TO REPORT IMMEDIATELY: A gastroenterologist can be  reached at any hour.  During normal business hours, 8:30 AM to 5:00 PM Monday through Friday, call [phone].  After hours and on weekends, please call the GI answering service at [phone] who will take a message and have the physician on call contact you.   Following lower endoscopy (colonoscopy or flexible sigmoidoscopy):  Excessive amounts of blood in the stool  Significant tenderness or worsening of abdominal pains  Swelling of the abdomen that is new, acute  Fever of 100F or higher  Following upper endoscopy (EGD)  Vomiting of blood or coffee ground material  New chest pain or pain under the shoulder blades  Painful or persistently difficult swallowing  New shortness of breath  Fever of 100F or higher  Black, tarry-looking stools  FOLLOW UP: If any biopsies were taken you will be contacted by phone or by letter within the next 1-3 weeks.  Call your gastroenterologist if you have not heard about the biopsies in 3 weeks.  Our staff will call the home number listed on your records the next business day following your procedure to check on you and address any questions or concerns that you may have at that time regarding the information given to you following your procedure. This is a courtesy call and so if there is no answer at the home number and we have not heard from you through the emergency physician on call, we will assume that you have returned to your  regular daily activities without incident.  SIGNATURES/CONFIDENTIALITY: You and/or your care partner have signed paperwork which will be entered into your electronic medical record.  These signatures attest to the fact that that the information above on your After Visit Summary has been reviewed and is understood.  Full responsibility of the confidentiality of this discharge information lies with you and/or your care-partner.

## 2014-12-05 ENCOUNTER — Telehealth: Payer: Self-pay | Admitting: *Deleted

## 2014-12-05 NOTE — Telephone Encounter (Signed)
Left message that we called for f/u 

## 2014-12-05 NOTE — Op Note (Addendum)
[facility]  [address]. [place], [address]   ENDOSCOPY PROCEDURE REPORT  PATIENT: Meredith [name], Meredith [name]  MR#: [id number] BIRTHDATE: 01-26-1951 , 67  yrs. old GENDER: female ENDOSCOPIST: Lafayette Dragon, MD REFERRED BY:  Gwendolyn Grant, M.D. PROCEDURE DATE:  12/02/2014 PROCEDURE:  EGD w/ biopsy ASA CLASS:     Class II INDICATIONS:  iron deficiency anemia and last EGD 2006, pt has daughter with PAnemia, she has a positive IF antibody, She B12 deficiency. MEDICATIONS: Monitored anesthesia care and Propofol 200 mg IV TOPICAL ANESTHETIC: none  DESCRIPTION OF PROCEDURE: After the risks benefits and alternatives of the procedure were thoroughly explained, informed consent was obtained.  The LB [id number] endoscope was introduced through the mouth and advanced to the second portion of the duodenum , Without limitations.  The instrument was slowly withdrawn as the mucosa was fully examined.      ESOPHAGUS: The mucosa of the esophagus appeared normal.  STOMACH: A single non-bleeding, clean-based, shallow and punctate ulcer ranging between 3-5 mm in size was found.  Biopsies were taken at edge of the ulcer.  DUODENUM: The duodenal mucosa showed no abnormalities in the bulb and 2nd part of the duodenum.  Cold forcep biopsies were taken in the second portion.There were surrounding erosions and erythema . Therre was normal rugal pattern- no evidence of atroph- biopsies taken. Retroflexed views revealed no abnormalities.     The scope was then withdrawn from the patient and the procedure completed.  COMPLICATIONS: There were no immediate complications.  ENDOSCOPIC IMPRESSION: 1.   The mucosa of the esophagus appeared normal 2.   Single ulcer ranging between 3-5 mm in size was found in prepyloric antrum  biopsies were taken 3.   The duodenal mucosa showed no abnormalities in the bulb and 2nd part of the duodenum cold forcep biopsies were taken in the  second portion  RECOMMENDATIONS: 1.  Await biopsy results 2.  Continue PPI 3. No ASA REPEAT EXAM: for EGD pending biopsy results.  eSigned:  Lafayette Dragon, MD 12/05/2014 5:18 PM Revised: 12/05/2014 5:18 PM   CC: photodocumentation not available today

## 2014-12-07 ENCOUNTER — Encounter: Payer: Self-pay | Admitting: Internal Medicine

## 2014-12-07 NOTE — Op Note (Addendum)
[facility]  [address]. [place], [address]   COLONOSCOPY PROCEDURE REPORT  PATIENT: Meredith, [name]  MR#: [id number] BIRTHDATE: 1951/02/21 , 63  yrs. old GENDER: female ENDOSCOPIST: Lafayette Dragon, MD REFERRED HF:[id number] Asa Lente, M.D. PROCEDURE DATE:  12/02/2014 PROCEDURE:   Colonoscopy with snare polypectomy First Screening Colonoscopy - Avg.  risk and is 50 yrs.  old or older - No.  Prior Negative Screening - Now for repeat screening. Less than 10 yrs Prior Negative Screening - Now for repeat screening.  Other: See Comments  History of Adenoma - Now for follow-up colonoscopy & has been > or = to 3 yrs.  N/A  Polyps Removed Today? Yes. ASA CLASS:   Class II INDICATIONS:iron and B12 deficiency.  Last EGD/colon in 2006. Daughter with PAnemia,.  Pt has positive AIF antibody.Marland Kitchen MEDICATIONS: Monitored anesthesia care and Propofol 180 mg IV  DESCRIPTION OF PROCEDURE:   After the risks benefits and alternatives of the procedure were thoroughly explained, informed consent was obtained.  The digital rectal exam revealed no abnormalities of the rectum.   The LB [id number]  endoscope was introduced through the anus and advanced to the cecum, which was identified by both the appendix and ileocecal valve. No adverse events experienced.   The quality of the prep was good, using MoviPrep  The instrument was then slowly withdrawn as the colon was fully examined.      COLON FINDINGS: A polypoid shaped semi-pedunculated polyp measuring 10 mm in size was found in the sigmoid colon.  A polypectomy was performed with a cold snare.  The resection was complete, the polyp tissue was completely retrieved and sent to histology.  Retroflexed views revealed no abnormalities. The time to cecum=6 minutes 20 seconds.  Withdrawal time=8 minutes 15 seconds.  The scope was withdrawn and the procedure completed. COMPLICATIONS: There were no immediate complications.  ENDOSCOPIC  IMPRESSION: Semi-pedunculated polyp was found in the sigmoid colon; polypectomy was performed with a cold snare  RECOMMENDATIONS: 1.  Await pathology results 2.  High fiber dier nothing to account for iron deficiency recall colon pending Path report  eSigned:  Lafayette Dragon, MD 12/05/2014 5:17 PM Revised: 12/05/2014 5:17 PM  cc: photodocumentation not available today

## 2014-12-09 ENCOUNTER — Ambulatory Visit (INDEPENDENT_AMBULATORY_CARE_PROVIDER_SITE_OTHER): Payer: BC Managed Care – PPO | Admitting: *Deleted

## 2014-12-09 DIAGNOSIS — D519 Vitamin B12 deficiency anemia, unspecified: Secondary | ICD-10-CM

## 2014-12-09 MED ORDER — CYANOCOBALAMIN 1000 MCG/ML IJ SOLN
1000.0000 ug | Freq: Once | INTRAMUSCULAR | Status: AC
  Administered 2014-12-09: 1000 ug via INTRAMUSCULAR

## 2014-12-14 ENCOUNTER — Telehealth: Payer: Self-pay | Admitting: Internal Medicine

## 2014-12-14 NOTE — Telephone Encounter (Signed)
She may resume ASA 81 mg. Although her gastric biopsies did not reveal Pernicious anemia, they showed chronic gastritis .Marland Kitchen I think her low B12 and Iron levels were  caused by diminished absorption of both vitamins.secondary to chronic gastritisover period of time.. One of the blood test was positive for antibody to Intrinsic Factor. She may, in the future, develop PA. At this point we need to monitor B12 and Iron levels every 6 months to make sure they remain normal . Please recheck B12 and Iron levels and CBC in January. Her last Iron level was back to normal.If the Iron levels keep dropping, consider SBCE to look for blood loss from the small bowl.

## 2014-12-14 NOTE — Telephone Encounter (Signed)
Patient given results as per letter. Patient is asking about resuming her Aspirin 81 mg daily. She states she had a small ulcer and was told to stop it but would like to restart it. She is also asking about her low iron and low B 12 level. She wants to know what might be causing this since she does not have pernicious anemia. Please, advise.

## 2014-12-14 NOTE — Telephone Encounter (Signed)
Left a message for patient to call back. 

## 2014-12-15 ENCOUNTER — Other Ambulatory Visit: Payer: Self-pay | Admitting: *Deleted

## 2014-12-15 DIAGNOSIS — D509 Iron deficiency anemia, unspecified: Secondary | ICD-10-CM

## 2014-12-15 NOTE — Telephone Encounter (Signed)
Patient notified of recommendations. Labs in [facility] for January. She wants to know if she should be on any medications for chronic gastritis. She is not on any stomach medications at this time. Please, advise.

## 2014-12-15 NOTE — Telephone Encounter (Signed)
I am not sure since she did not have any specific GI symptoms, so monitoring is difficult. I f she has dyspepsia I suggest Ranitidine 150 mg daily, may be OTC.

## 2014-12-16 NOTE — Telephone Encounter (Signed)
Left a message for patient to call back. 

## 2014-12-20 NOTE — Telephone Encounter (Signed)
Attempted to return call again.  The mailbox is full.  Unable to leave a message

## 2014-12-22 NOTE — Telephone Encounter (Signed)
Patient notified

## 2014-12-23 ENCOUNTER — Other Ambulatory Visit: Payer: Self-pay | Admitting: Internal Medicine

## 2014-12-30 HISTORY — PX: KNEE ARTHROSCOPY: SHX127

## 2015-01-02 ENCOUNTER — Other Ambulatory Visit: Payer: Self-pay | Admitting: Orthopaedic Surgery

## 2015-01-03 ENCOUNTER — Other Ambulatory Visit: Payer: Self-pay | Admitting: Orthopaedic Surgery

## 2015-01-03 DIAGNOSIS — M25562 Pain in left knee: Secondary | ICD-10-CM

## 2015-01-07 ENCOUNTER — Ambulatory Visit
Admission: RE | Admit: 2015-01-07 | Discharge: 2015-01-07 | Disposition: A | Discharge status: Home / Self Care | Payer: Medicare Other | Source: Ambulatory Visit | Attending: Orthopaedic Surgery | Admitting: Orthopaedic Surgery

## 2015-01-07 DIAGNOSIS — M23362 Other meniscus derangements, other lateral meniscus, left knee: Secondary | ICD-10-CM | POA: Diagnosis not present

## 2015-01-07 DIAGNOSIS — M23222 Derangement of posterior horn of medial meniscus due to old tear or injury, left knee: Secondary | ICD-10-CM | POA: Diagnosis not present

## 2015-01-07 DIAGNOSIS — M25562 Pain in left knee: Secondary | ICD-10-CM

## 2015-01-10 ENCOUNTER — Ambulatory Visit (INDEPENDENT_AMBULATORY_CARE_PROVIDER_SITE_OTHER): Payer: Medicare Other | Admitting: *Deleted

## 2015-01-10 DIAGNOSIS — E538 Deficiency of other specified B group vitamins: Secondary | ICD-10-CM

## 2015-01-10 MED ORDER — CYANOCOBALAMIN 1000 MCG/ML IJ SOLN
1000.0000 ug | Freq: Once | INTRAMUSCULAR | Status: AC
  Administered 2015-01-10: 1000 ug via INTRAMUSCULAR

## 2015-01-11 DIAGNOSIS — S83282A Other tear of lateral meniscus, current injury, left knee, initial encounter: Secondary | ICD-10-CM | POA: Diagnosis not present

## 2015-01-11 DIAGNOSIS — S83242A Other tear of medial meniscus, current injury, left knee, initial encounter: Secondary | ICD-10-CM | POA: Diagnosis not present

## 2015-01-11 DIAGNOSIS — M25562 Pain in left knee: Secondary | ICD-10-CM | POA: Diagnosis not present

## 2015-01-16 DIAGNOSIS — M94262 Chondromalacia, left knee: Secondary | ICD-10-CM | POA: Diagnosis not present

## 2015-01-16 DIAGNOSIS — S83242D Other tear of medial meniscus, current injury, left knee, subsequent encounter: Secondary | ICD-10-CM | POA: Diagnosis not present

## 2015-01-16 DIAGNOSIS — M25562 Pain in left knee: Secondary | ICD-10-CM | POA: Diagnosis not present

## 2015-01-16 DIAGNOSIS — S83232A Complex tear of medial meniscus, current injury, left knee, initial encounter: Secondary | ICD-10-CM | POA: Diagnosis not present

## 2015-01-16 DIAGNOSIS — X58XXXA Exposure to other specified factors, initial encounter: Secondary | ICD-10-CM | POA: Diagnosis not present

## 2015-01-16 DIAGNOSIS — Y929 Unspecified place or not applicable: Secondary | ICD-10-CM | POA: Diagnosis not present

## 2015-01-16 DIAGNOSIS — S83272A Complex tear of lateral meniscus, current injury, left knee, initial encounter: Secondary | ICD-10-CM | POA: Diagnosis not present

## 2015-01-18 ENCOUNTER — Other Ambulatory Visit: Payer: Self-pay

## 2015-01-18 MED ORDER — METFORMIN HCL 1000 MG PO TABS: 1000.0000 mg | ORAL_TABLET | Freq: Two times a day (BID) | ORAL | Status: DC

## 2015-01-31 ENCOUNTER — Ambulatory Visit: Payer: Medicare Other | Attending: Orthopaedic Surgery | Admitting: Physical Therapy

## 2015-01-31 DIAGNOSIS — M25662 Stiffness of left knee, not elsewhere classified: Secondary | ICD-10-CM | POA: Diagnosis not present

## 2015-01-31 DIAGNOSIS — R609 Edema, unspecified: Secondary | ICD-10-CM | POA: Insufficient documentation

## 2015-01-31 DIAGNOSIS — M25562 Pain in left knee: Secondary | ICD-10-CM | POA: Insufficient documentation

## 2015-01-31 DIAGNOSIS — Z4789 Encounter for other orthopedic aftercare: Secondary | ICD-10-CM | POA: Insufficient documentation

## 2015-02-01 ENCOUNTER — Ambulatory Visit: Payer: Medicare Other | Admitting: Physical Therapy

## 2015-02-01 DIAGNOSIS — M25662 Stiffness of left knee, not elsewhere classified: Secondary | ICD-10-CM | POA: Diagnosis not present

## 2015-02-01 DIAGNOSIS — R609 Edema, unspecified: Secondary | ICD-10-CM | POA: Diagnosis not present

## 2015-02-01 DIAGNOSIS — M25562 Pain in left knee: Secondary | ICD-10-CM | POA: Diagnosis not present

## 2015-02-01 DIAGNOSIS — Z4789 Encounter for other orthopedic aftercare: Secondary | ICD-10-CM | POA: Diagnosis not present

## 2015-02-03 ENCOUNTER — Ambulatory Visit: Payer: Medicare Other | Admitting: Physical Therapy

## 2015-02-03 DIAGNOSIS — R609 Edema, unspecified: Secondary | ICD-10-CM | POA: Diagnosis not present

## 2015-02-03 DIAGNOSIS — M25662 Stiffness of left knee, not elsewhere classified: Secondary | ICD-10-CM | POA: Diagnosis not present

## 2015-02-03 DIAGNOSIS — M25562 Pain in left knee: Secondary | ICD-10-CM | POA: Diagnosis not present

## 2015-02-03 DIAGNOSIS — Z4789 Encounter for other orthopedic aftercare: Secondary | ICD-10-CM | POA: Diagnosis not present

## 2015-02-08 ENCOUNTER — Ambulatory Visit: Payer: Medicare Other | Admitting: Physical Therapy

## 2015-02-08 ENCOUNTER — Encounter: Payer: BC Managed Care – PPO | Admitting: Internal Medicine

## 2015-02-08 DIAGNOSIS — Z4789 Encounter for other orthopedic aftercare: Secondary | ICD-10-CM | POA: Diagnosis not present

## 2015-02-08 DIAGNOSIS — R609 Edema, unspecified: Secondary | ICD-10-CM | POA: Diagnosis not present

## 2015-02-08 DIAGNOSIS — M25562 Pain in left knee: Secondary | ICD-10-CM | POA: Diagnosis not present

## 2015-02-08 DIAGNOSIS — M25662 Stiffness of left knee, not elsewhere classified: Secondary | ICD-10-CM | POA: Diagnosis not present

## 2015-02-10 ENCOUNTER — Ambulatory Visit: Payer: Medicare Other | Admitting: Physical Therapy

## 2015-02-11 ENCOUNTER — Other Ambulatory Visit: Payer: Self-pay | Admitting: Internal Medicine

## 2015-02-13 ENCOUNTER — Ambulatory Visit: Payer: Medicare Other | Admitting: Physical Therapy

## 2015-02-14 ENCOUNTER — Ambulatory Visit: Payer: Medicare Other

## 2015-02-15 ENCOUNTER — Ambulatory Visit: Payer: Medicare Other | Admitting: Physical Therapy

## 2015-02-15 ENCOUNTER — Encounter: Payer: Self-pay | Admitting: Physical Therapy

## 2015-02-15 DIAGNOSIS — M25662 Stiffness of left knee, not elsewhere classified: Secondary | ICD-10-CM

## 2015-02-15 DIAGNOSIS — R609 Edema, unspecified: Secondary | ICD-10-CM | POA: Diagnosis not present

## 2015-02-15 DIAGNOSIS — R29898 Other symptoms and signs involving the musculoskeletal system: Secondary | ICD-10-CM

## 2015-02-15 DIAGNOSIS — M25562 Pain in left knee: Secondary | ICD-10-CM | POA: Diagnosis not present

## 2015-02-15 DIAGNOSIS — Z4789 Encounter for other orthopedic aftercare: Secondary | ICD-10-CM | POA: Diagnosis not present

## 2015-02-15 NOTE — Patient Instructions (Signed)
Instructed pt to use ice 2x day 10 min.

## 2015-02-15 NOTE — Therapy (Signed)
[facility] [address], [address] [place], [place], [address] Phone: [phone]   Fax:  [phone]  Physical Therapy Treatment  Patient Details  Name: Meredith [name] MRN: [id number] Date of Birth: 08/05/1951 Referring Provider:  Leschber, Valerie A, MD  Encounter Date: 02/15/2015      PT End of Session - 02/15/15 1102    Visit Number 6   Number of Visits 12   PT Start Time 1015   PT Stop Time 1120   PT Time Calculation (min) 65 min   Activity Tolerance Patient tolerated treatment well   Behavior During Therapy WFL for tasks assessed/performed      Past Medical History  Diagnosis Date  . Unspecified sleep apnea   . Other and unspecified hyperlipidemia   . Depressive disorder, not elsewhere classified   . Anxiety state, unspecified   . Asthma   . Diabetes mellitus without complication   . Anemia   . CIN I (cervical intraepithelial neoplasia I)   . B12 deficiency anemia 05/2014 dx  . Internal hemorrhoids   . Arthritis     KNEES  . COPD (chronic obstructive pulmonary disease)   . Emphysema of lung   . Sleep apnea   . Hypertension     Past Surgical History  Procedure Laterality Date  . Hemorrhoid surgery    . Cesarean section    . Knee arthroscopy    . Colonoscopy    . Upper gastrointestinal endoscopy      There were no vitals taken for this visit.  Visit Diagnosis:  Decreased strength involving knee joint  Edema  Decreased range of motion of left knee      Subjective Assessment - 02/15/15 1024    Symptoms Knee stiff and painful especially upon standing   Currently in Pain? Yes   Pain Score 2    Pain Location Knee   Pain Orientation Left   Aggravating Factors  End ranges of flexion and extension, sit to stand, at night   Pain Relieving Factors medication    Multiple Pain Sites No          [facility] PT Assessment - 02/15/15 0001    Assessment   Medical Diagnosis S/P LT knee scope, Grade 3-4 chondral  changes   Precautions   Precautions None   Balance Screen   Has the patient fallen in the past 6 months No   Observation/Other Assessments   Focus on Therapeutic Outcomes (FOTO)  67% limited                  [facility] Adult PT Treatment/Exercise - 02/15/15 0001    Exercises   Exercises Knee/Hip   Knee/Hip Exercises: Aerobic   Stationary Bike ROM level 0, 10 min   Knee/Hip Exercises: Standing   Lateral Step Up 2 sets;10 reps   Forward Step Up 3 sets;10 reps   Rocker Board 3 minutes   Rebounder weight shifting 1 min each direction   Knee/Hip Exercises: Supine   Heel Slides 10 reps   Straight Leg Raises 3 sets;10 reps  1# on last set added   Modalities   Modalities Ultrasound;Cryotherapy   Cryotherapy   Number Minutes Cryotherapy 15 Minutes   Cryotherapy Location Knee   Type of Cryotherapy Other (comment)  Gameready 3 flakes medium   Ultrasound   Ultrasound Location LT knee   Ultrasound Parameters 100%, .8wtch2, 8 min, 3 mh   Ultrasound Goals Edema                  PT Education - 02/15/15 1102    Education provided Yes   Education Details To ice more   Person(s) Educated Patient   Methods Explanation   Comprehension Verbalized understanding             PT Long Term Goals - 02/15/15 1105    PT LONG TERM GOAL #1   Title Demonstrate understanding of RICE method   Time 4   Period Weeks   Status Achieved   PT LONG TERM GOAL #2   Title Independent in advanced HEP   Time 4   Period Weeks   Status On-going   PT LONG TERM GOAL #3   Title Walk without limp, full weightbearing and pain 50% decreased   Time 4   Period Weeks   Status On-going  40% decreased   PT LONG TERM GOAL #4   Title Go up and down stairs step over step slowly and cautiously   Time 4   Period Weeks   Status On-going  Trying step to step, doing 50% of time.    PT LONG TERM GOAL #5   Title Get out of chair with 75% greater ease   Time 4   Period Weeks   Status On-going   Patient reports not much change   PT LONG TERM GOAL #7   Title Sit with minimal LT knee pain   Time 4   Period Weeks   Status Partially Met  Sitting is ok, but the transition to standing is painful.   PT LONG TERM GOAL  #9   TITLE LT knee AROM >/= 120 degrees flexion   Time 4   Period Weeks   Status On-going   PT LONG TERM GOAL  #10   TITLE LT knee AROM 0 degrees extension   Time 4   Period Weeks   Status On-going               Plan - 02/15/15 1122    PT Frequency 3x / week   PT Duration 4 weeks        Problem List Patient Active Problem List   Diagnosis Date Noted  . B12 deficiency anemia   . Other emphysema 02/23/2014  . Necrobiosis lipoidica diabeticorum 12/31/2013  . Essential hypertension, benign 11/05/2013  . Routine health maintenance 09/27/2012  . Vitamin D deficiency 06/20/2011  . OBESITY, CLASS II 03/18/2011  . PULMONARY NODULE 07/12/2010  . DIABETES MELLITUS, TYPE II 11/02/2007  . HYPERLIPIDEMIA 10/01/2007  . ANXIETY 10/01/2007  . DEPRESSION 10/01/2007  . SLEEP APNEA 10/01/2007  . ARTHROSCOPY, KNEE, HX OF 10/01/2007    Josephina Melcher, PTA 02/15/2015, 11:23 AM  [facility] [address] [place], [place], [place], [address] Phone: [phone]   Fax:  [phone]

## 2015-02-17 ENCOUNTER — Encounter: Payer: Self-pay | Admitting: Physical Therapy

## 2015-02-17 ENCOUNTER — Ambulatory Visit: Payer: Medicare Other | Admitting: Physical Therapy

## 2015-02-17 DIAGNOSIS — R609 Edema, unspecified: Secondary | ICD-10-CM | POA: Diagnosis not present

## 2015-02-17 DIAGNOSIS — M25562 Pain in left knee: Secondary | ICD-10-CM | POA: Diagnosis not present

## 2015-02-17 DIAGNOSIS — R29898 Other symptoms and signs involving the musculoskeletal system: Secondary | ICD-10-CM

## 2015-02-17 DIAGNOSIS — M25662 Stiffness of left knee, not elsewhere classified: Secondary | ICD-10-CM | POA: Diagnosis not present

## 2015-02-17 DIAGNOSIS — Z4789 Encounter for other orthopedic aftercare: Secondary | ICD-10-CM | POA: Diagnosis not present

## 2015-02-17 NOTE — Patient Instructions (Signed)

## 2015-02-17 NOTE — Therapy (Signed)
[facility] [address] [place], [place], [place], [address] Phone: [phone]   Fax:  [phone]  Physical Therapy Treatment  Patient Details  Name: Meredith [name] MRN: [id number] Date of Birth: 1951-02-24 Referring Provider:  Rowe Clack, MD  Encounter Date: 02/17/2015      PT End of Session - 02/17/15 1023    Visit Number 7   Date for PT Re-Evaluation 02/28/15   PT Start Time 0935   PT Stop Time 1048   PT Time Calculation (min) 73 min      Past Medical History  Diagnosis Date  . Unspecified sleep apnea   . Other and unspecified hyperlipidemia   . Depressive disorder, not elsewhere classified   . Anxiety state, unspecified   . Asthma   . Diabetes mellitus without complication   . Anemia   . CIN I (cervical intraepithelial neoplasia I)   . B12 deficiency anemia 05/2014 dx  . Internal hemorrhoids   . Arthritis     KNEES  . COPD (chronic obstructive pulmonary disease)   . Emphysema of lung   . Sleep apnea   . Hypertension     Past Surgical History  Procedure Laterality Date  . Hemorrhoid surgery    . Cesarean section    . Knee arthroscopy    . Colonoscopy    . Upper gastrointestinal endoscopy      There were no vitals taken for this visit.  Visit Diagnosis:  Edema  Decreased strength involving knee joint      Subjective Assessment - 02/17/15 0934    Symptoms pain about same with movements   Currently in Pain? Yes   Pain Score 2    Pain Location Knee   Pain Orientation Left   Pain Descriptors / Indicators Aching;Discomfort   Aggravating Factors  bending   Pain Relieving Factors ice and meds   Multiple Pain Sites No          [facility] PT Assessment - 02/17/15 0001    AROM   Overall AROM  --  14-112 degrees active                  [facility] Adult PT Treatment/Exercise - 02/17/15 0001    Knee/Hip Exercises: Aerobic   Stationary Bike Level 2 x  10 min   Knee/Hip Exercises:  Machines for Strengthening   Cybex Leg Press Seat #6 bil 50#, vc to hold extension stretch 5 seconds   Knee/Hip Exercises: Standing   Lateral Step Up 3 sets;10 reps   Forward Step Up 3 sets;10 reps   Rocker Board 3 minutes   Rebounder weight shifting 1 min each direction   Knee/Hip Exercises: Supine   Heel Slides 10 reps   Straight Leg Raises 3 sets;10 reps  1# on last set added   Knee/Hip Exercises: Sidelying   Hip ABduction 2 sets;10 reps   Hip ABduction Limitations added 1# to above   Cryotherapy   Number Minutes Cryotherapy 15 Minutes   Cryotherapy Location Knee   Type of Cryotherapy --  Game ready 3 flakes medium   Iontophoresis   Type of Iontophoresis Dexamethasone   Location Medial LT knee   Dose 1.5 ml of dex, Hybresis with activation   Time 8                PT Education - 02/17/15 1023    Education provided Yes   Education Details Ionto precautions    Person(s) Educated Patient  Methods Handout   Comprehension Verbalized understanding             PT Long Term Goals - 02/17/15 1036    PT LONG TERM GOAL #1   Title Demonstrate understanding of RICE method   Time 4   Period Weeks   Status Achieved   PT LONG TERM GOAL #2   Title Independent in advanced HEP   Period Weeks   Status On-going   PT LONG TERM GOAL #3   Title Walk without limp, full weightbearing and pain 50% decreased   Period Weeks   Status On-going   PT LONG TERM GOAL #4   Title Go up and down stairs step over step slowly and cautiously   Period Weeks   Status On-going   PT LONG TERM GOAL #5   Title Get out of chair with 75% greater ease   Period Weeks   Status On-going   PT LONG TERM GOAL #7   Title Sit with minimal LT knee pain   Time 4   Period Weeks   Status Achieved  No pain with sitting now   PT LONG TERM GOAL  #9   TITLE LT knee AROM >/= 120 degrees flexion   Time 4   Period Weeks   Status On-going  14-112 degrees AROM   PT LONG TERM GOAL  #10   TITLE LT knee  AROM 0 degrees extension   Time 4   Period Weeks   Status On-going  14-112 degrees AROM               Plan - 02/17/15 1034    Clinical Impression Statement Initiated Ionto today to medial knee. Continues to tolerate knee strength without increasing pain. Met sitting goal.   PT Frequency 3x / week   PT Duration 4 weeks   PT Next Visit Plan Check ionto and continue if ok, continue with strength and game ready.   Consulted and Agree with Plan of Care Patient        Problem List Patient Active Problem List   Diagnosis Date Noted  . B12 deficiency anemia   . Other emphysema 02/23/2014  . Necrobiosis lipoidica diabeticorum 12/31/2013  . Essential hypertension, benign 11/05/2013  . Routine health maintenance 09/27/2012  . Vitamin D deficiency 06/20/2011  . OBESITY, CLASS II 03/18/2011  . PULMONARY NODULE 07/12/2010  . DIABETES MELLITUS, TYPE II 11/02/2007  . HYPERLIPIDEMIA 10/01/2007  . ANXIETY 10/01/2007  . DEPRESSION 10/01/2007  . SLEEP APNEA 10/01/2007  . ARTHROSCOPY, KNEE, HX OF 10/01/2007    Merle Whitehorn,PTA 02/17/2015, 10:50 AM  [facility] [address] [place], [place], [place], [address] Phone: [phone]   Fax:  [phone]

## 2015-02-20 ENCOUNTER — Ambulatory Visit: Payer: Medicare Other | Admitting: Physical Therapy

## 2015-02-20 ENCOUNTER — Encounter: Payer: Self-pay | Admitting: Physical Therapy

## 2015-02-20 DIAGNOSIS — R609 Edema, unspecified: Secondary | ICD-10-CM | POA: Diagnosis not present

## 2015-02-20 DIAGNOSIS — R29898 Other symptoms and signs involving the musculoskeletal system: Secondary | ICD-10-CM

## 2015-02-20 DIAGNOSIS — Z4789 Encounter for other orthopedic aftercare: Secondary | ICD-10-CM | POA: Diagnosis not present

## 2015-02-20 DIAGNOSIS — M25562 Pain in left knee: Secondary | ICD-10-CM | POA: Diagnosis not present

## 2015-02-20 DIAGNOSIS — M25662 Stiffness of left knee, not elsewhere classified: Secondary | ICD-10-CM | POA: Diagnosis not present

## 2015-02-20 NOTE — Therapy (Signed)
[facility] [address] [place], [place], [place], [address] Phone: [phone]   Fax:  [phone]  Physical Therapy Treatment  Patient Details  Name: Meredith [name] MRN: [id number] Date of Birth: [birth year] Referring Provider:  Rowe Clack, MD  Encounter Date: 02/20/2015      PT End of Session - 02/20/15 1022    Visit Number 8   Date for PT Re-Evaluation 02/28/15   PT Start Time 0930   PT Stop Time 1040   PT Time Calculation (min) 70 min   Activity Tolerance Patient tolerated treatment well  Avoided steps today as they seem to be an aggrevating factor      Past Medical History  Diagnosis Date  . Unspecified sleep apnea   . Other and unspecified hyperlipidemia   . Depressive disorder, not elsewhere classified   . Anxiety state, unspecified   . Asthma   . Diabetes mellitus without complication   . Anemia   . CIN I (cervical intraepithelial neoplasia I)   . B12 deficiency anemia 05/2014 dx  . Internal hemorrhoids   . Arthritis     KNEES  . COPD (chronic obstructive pulmonary disease)   . Emphysema of lung   . Sleep apnea   . Hypertension     Past Surgical History  Procedure Laterality Date  . Hemorrhoid surgery    . Cesarean section    . Knee arthroscopy    . Colonoscopy    . Upper gastrointestinal endoscopy      There were no vitals taken for this visit.  Visit Diagnosis:  Edema  Decreased strength involving knee joint  Decreased range of motion of left knee      Subjective Assessment - 02/20/15 0927    Symptoms Bad pain over weekend. Had to rest and ice to get pain down.    Currently in Pain? No/denies   Aggravating Factors  bending   Pain Relieving Factors ice, meds, being NWB   Multiple Pain Sites No                    [facility] Adult PT Treatment/Exercise - 02/20/15 0001    Knee/Hip Exercises: Stretches   Active Hamstring Stretch 3 reps;30 seconds   Knee/Hip Exercises:  Aerobic   Stationary Bike Level 1 ROm x10 min   Knee/Hip Exercises: Standing   Rocker Board 3 minutes   Rebounder weight shifting 1 min each direction   Knee/Hip Exercises: Supine   Straight Leg Raises 3 sets;10 reps  1# on last set added   Knee/Hip Exercises: Sidelying   Hip ABduction 2 sets;10 reps  Verbal cues to keep knee straight.   Cryotherapy   Number Minutes Cryotherapy 15 Minutes   Cryotherapy Location Knee   Type of Cryotherapy --  Game ready 3 flakes medium   Iontophoresis   Type of Iontophoresis Dexamethasone   Location Medial LT knee   Dose 1.5 ml of dex, Hybresis with activation   Time 8                     PT Long Term Goals - 02/20/15 1025    PT LONG TERM GOAL #1   Title Demonstrate understanding of RICE method   Time 4   Period Weeks   Status Achieved   PT LONG TERM GOAL #2   Title Independent in advanced HEP   Time 4   PT LONG TERM GOAL #3   Title Walk without limp,  full weightbearing and pain 50% decreased   Time 4   Period Weeks   Status On-going  Still moderate limp during gait.   PT LONG TERM GOAL #4   Title Go up and down stairs step over step slowly and cautiously   Time 4   Period Weeks   Status Partially Met  Can do but painful later   PT LONG TERM GOAL #5   Title Get out of chair with 75% greater ease   Time 4   Period Weeks   Status On-going  25%   PT LONG TERM GOAL #7   Title Sit with minimal LT knee pain   Time 4   Period Weeks   Status Achieved               Plan - 02/20/15 1023    Clinical Impression Statement See how pain has been in last 24 hours. Patient tolerated ionto without adverse effects, will continue. Be conservative with loaded knee flexion.   Pt will benefit from skilled therapeutic intervention in order to improve on the following deficits Abnormal gait;Decreased activity tolerance;Decreased mobility;Decreased strength;Increased edema;Decreased endurance;Decreased range of motion;Difficulty  walking;Impaired flexibility;Improper body mechanics        Problem List Patient Active Problem List   Diagnosis Date Noted  . B12 deficiency anemia   . Other emphysema 02/23/2014  . Necrobiosis lipoidica diabeticorum 12/31/2013  . Essential hypertension, benign 11/05/2013  . Routine health maintenance 09/27/2012  . Vitamin D deficiency 06/20/2011  . OBESITY, CLASS II 03/18/2011  . PULMONARY NODULE 07/12/2010  . DIABETES MELLITUS, TYPE II 11/02/2007  . HYPERLIPIDEMIA 10/01/2007  . ANXIETY 10/01/2007  . DEPRESSION 10/01/2007  . SLEEP APNEA 10/01/2007  . ARTHROSCOPY, KNEE, HX OF 10/01/2007    COCHRAN,JENNIFER, PTA 02/20/2015, 10:40 AM  [facility] [address] [place], [place], [place], [address] Phone: [phone]   Fax:  [phone]

## 2015-02-22 ENCOUNTER — Encounter: Payer: Self-pay | Admitting: Physical Therapy

## 2015-02-22 ENCOUNTER — Ambulatory Visit: Payer: Medicare Other | Admitting: Physical Therapy

## 2015-02-22 ENCOUNTER — Ambulatory Visit (INDEPENDENT_AMBULATORY_CARE_PROVIDER_SITE_OTHER): Payer: Medicare Other

## 2015-02-22 DIAGNOSIS — R609 Edema, unspecified: Secondary | ICD-10-CM | POA: Diagnosis not present

## 2015-02-22 DIAGNOSIS — E538 Deficiency of other specified B group vitamins: Secondary | ICD-10-CM

## 2015-02-22 DIAGNOSIS — D519 Vitamin B12 deficiency anemia, unspecified: Secondary | ICD-10-CM | POA: Diagnosis not present

## 2015-02-22 DIAGNOSIS — M25662 Stiffness of left knee, not elsewhere classified: Secondary | ICD-10-CM

## 2015-02-22 DIAGNOSIS — M25562 Pain in left knee: Secondary | ICD-10-CM | POA: Diagnosis not present

## 2015-02-22 DIAGNOSIS — Z4789 Encounter for other orthopedic aftercare: Secondary | ICD-10-CM | POA: Diagnosis not present

## 2015-02-22 DIAGNOSIS — L603 Nail dystrophy: Secondary | ICD-10-CM | POA: Diagnosis not present

## 2015-02-22 DIAGNOSIS — R29898 Other symptoms and signs involving the musculoskeletal system: Secondary | ICD-10-CM

## 2015-02-22 MED ORDER — CYANOCOBALAMIN 1000 MCG/ML IJ SOLN
1000.0000 ug | Freq: Once | INTRAMUSCULAR | Status: AC
  Administered 2015-02-22: 1000 ug via INTRAMUSCULAR

## 2015-02-22 NOTE — Therapy (Signed)
[facility] [address], [place], [place], [address] Phone: [phone]   Fax:  [phone]  Physical Therapy Treatment  Patient Details  Name: Meredith [name] MRN: [id number] Date of Birth: Nov 16, 1951 Referring Provider:  Rowe Clack, MD  Encounter Date: 02/22/2015      PT End of Session - 02/22/15 1014    Visit Number 9   Number of Visits 12   Date for PT Re-Evaluation 02/28/15   PT Start Time 0930   PT Stop Time 1040   PT Time Calculation (min) 70 min   Activity Tolerance Patient tolerated treatment well   Behavior During Therapy [facility] for tasks assessed/performed      Past Medical History  Diagnosis Date  . Unspecified sleep apnea   . Other and unspecified hyperlipidemia   . Depressive disorder, not elsewhere classified   . Anxiety state, unspecified   . Asthma   . Diabetes mellitus without complication   . Anemia   . CIN I (cervical intraepithelial neoplasia I)   . B12 deficiency anemia 05/2014 dx  . Internal hemorrhoids   . Arthritis     KNEES  . COPD (chronic obstructive pulmonary disease)   . Emphysema of lung   . Sleep apnea   . Hypertension     Past Surgical History  Procedure Laterality Date  . Hemorrhoid surgery    . Cesarean section    . Knee arthroscopy    . Colonoscopy    . Upper gastrointestinal endoscopy      There were no vitals taken for this visit.  Visit Diagnosis:  Edema  Decreased strength involving knee joint  Decreased range of motion of left knee      Subjective Assessment - 02/22/15 0935    Symptoms Reports seeing PA yesterday, no comment. Sees MD in one month, continue with therapy. PAtient ambulates with moderate limp this AM.   Currently in Pain? Yes   Pain Score 3    Pain Location Knee   Pain Orientation Left   Pain Descriptors / Indicators Aching;Discomfort   Pain Type Surgical pain   Aggravating Factors  bending   Pain Relieving Factors ice, rest,  meds, NWB   Multiple Pain Sites No          [facility] PT Assessment - 02/22/15 0001    AROM   Overall AROM  Other (comment)  10-116 degrees active                  [facility] Adult PT Treatment/Exercise - 02/22/15 0001    Knee/Hip Exercises: Stretches   Active Hamstring Stretch 3 reps;30 seconds   Knee/Hip Exercises: Aerobic   Stationary Bike Level 1 ROm x10 min   Knee/Hip Exercises: Machines for Strengthening   Cybex Leg Press Seat #6, 55# 3x10   Knee/Hip Exercises: Standing   Lateral Step Up --  Held   Rocker Board 4 minutes   Rebounder weight shifting 1 min each direction   Knee/Hip Exercises: Supine   Straight Leg Raises 3 sets;10 reps  1# on last set added   Knee/Hip Exercises: Sidelying   Hip ABduction Left;3 sets;10 reps   Hip ABduction Limitations 1#   Cryotherapy   Number Minutes Cryotherapy 15 Minutes   Cryotherapy Location Knee   Type of Cryotherapy Other (comment)  Game ready 3 flakes medium   Iontophoresis   Type of Iontophoresis Dexamethasone   Location Medial LT knee   Dose 1.5 ml of dex, Hybresis  with activation   Time 8                     PT Long Term Goals - 02/20/15 1025    PT LONG TERM GOAL #1   Title Demonstrate understanding of RICE method   Time 4   Period Weeks   Status Achieved   PT LONG TERM GOAL #2   Title Independent in advanced HEP   Time 4   PT LONG TERM GOAL #3   Title Walk without limp, full weightbearing and pain 50% decreased   Time 4   Period Weeks   Status On-going  Still moderate limp during gait.   PT LONG TERM GOAL #4   Title Go up and down stairs step over step slowly and cautiously   Time 4   Period Weeks   Status Partially Met  Can do but painful later   PT LONG TERM GOAL #5   Title Get out of chair with 75% greater ease   Time 4   Period Weeks   Status On-going  25%   PT LONG TERM GOAL #7   Title Sit with minimal LT knee pain   Time 4   Period Weeks   Status Achieved                Plan - 02/22/15 1016    Clinical Impression Statement Patient again performed all exercises without increaseing pain. Knee AROM improved from last week   Pt will benefit from skilled therapeutic intervention in order to improve on the following deficits Abnormal gait;Decreased activity tolerance;Decreased mobility;Decreased strength;Increased edema;Decreased endurance;Decreased range of motion;Difficulty walking;Impaired flexibility;Improper body mechanics   Rehab Potential Good   PT Frequency 3x / week   PT Duration 4 weeks   PT Next Visit Plan Ionto #3 continue with strength, ROM, work on extension as this is very painful motion. ERO next week.   Consulted and Agree with Plan of Care Patient        Problem List Patient Active Problem List   Diagnosis Date Noted  . B12 deficiency anemia   . Other emphysema 02/23/2014  . Necrobiosis lipoidica diabeticorum 12/31/2013  . Essential hypertension, benign 11/05/2013  . Routine health maintenance 09/27/2012  . Vitamin D deficiency 06/20/2011  . OBESITY, CLASS II 03/18/2011  . PULMONARY NODULE 07/12/2010  . DIABETES MELLITUS, TYPE II 11/02/2007  . HYPERLIPIDEMIA 10/01/2007  . ANXIETY 10/01/2007  . DEPRESSION 10/01/2007  . SLEEP APNEA 10/01/2007  . ARTHROSCOPY, KNEE, HX OF 10/01/2007    COCHRAN,JENNIFER, PTA  02/22/2015, 10:30 AM  [facility] [address] [place], [place], [address] Phone: [phone]   Fax:  [phone]

## 2015-02-24 ENCOUNTER — Ambulatory Visit: Payer: Medicare Other | Admitting: Physical Therapy

## 2015-02-24 ENCOUNTER — Encounter: Payer: Self-pay | Admitting: Physical Therapy

## 2015-02-24 DIAGNOSIS — R609 Edema, unspecified: Secondary | ICD-10-CM | POA: Diagnosis not present

## 2015-02-24 DIAGNOSIS — Z4789 Encounter for other orthopedic aftercare: Secondary | ICD-10-CM | POA: Diagnosis not present

## 2015-02-24 DIAGNOSIS — M25562 Pain in left knee: Secondary | ICD-10-CM | POA: Diagnosis not present

## 2015-02-24 DIAGNOSIS — M25662 Stiffness of left knee, not elsewhere classified: Secondary | ICD-10-CM | POA: Diagnosis not present

## 2015-02-24 DIAGNOSIS — R29898 Other symptoms and signs involving the musculoskeletal system: Secondary | ICD-10-CM

## 2015-02-24 NOTE — Therapy (Signed)
[facility] [address], [place], [place], [address] Phone: [phone]   Fax:  [phone]  Physical Therapy Treatment  Patient Details  Name: Meredith [name] MRN: [id number] Date of Birth: [birth year] Referring Provider:  Rowe Clack, MD  Encounter Date: 02/24/2015      PT End of Session - 02/24/15 1022    Visit Number 10   Number of Visits 12   Date for PT Re-Evaluation 02/28/15   PT Start Time 0935   PT Stop Time 1045   PT Time Calculation (min) 70 min   Activity Tolerance Patient tolerated treatment well   Behavior During Therapy [facility] for tasks assessed/performed      Past Medical History  Diagnosis Date  . Unspecified sleep apnea   . Other and unspecified hyperlipidemia   . Depressive disorder, not elsewhere classified   . Anxiety state, unspecified   . Asthma   . Diabetes mellitus without complication   . Anemia   . CIN I (cervical intraepithelial neoplasia I)   . B12 deficiency anemia 05/2014 dx  . Internal hemorrhoids   . Arthritis     KNEES  . COPD (chronic obstructive pulmonary disease)   . Emphysema of lung   . Sleep apnea   . Hypertension     Past Surgical History  Procedure Laterality Date  . Hemorrhoid surgery    . Cesarean section    . Knee arthroscopy    . Colonoscopy    . Upper gastrointestinal endoscopy      There were no vitals taken for this visit.  Visit Diagnosis:  Edema  Decreased strength involving knee joint  Decreased range of motion of left knee  Knee pain, acute, left      Subjective Assessment - 02/24/15 0937    Symptoms More stiff after therapy typically, knee feels ok for short periods: this could be up to an hour, always depends. Ionto might be helping.   Currently in Pain? Yes   Pain Score 3    Pain Location Knee   Pain Orientation Left   Pain Descriptors / Indicators Aching;Discomfort   Pain Type Surgical pain   Aggravating Factors  bending,  "depends"   Pain Relieving Factors ice, meds, meds, NWB   Multiple Pain Sites No                    [facility] Adult PT Treatment/Exercise - 02/24/15 0001    Knee/Hip Exercises: Aerobic   Stationary Bike L1 x10 min   Knee/Hip Exercises: Machines for Strengthening   Cybex Leg Press Seat #6, 55# 3x10   Knee/Hip Exercises: Standing   Rocker Board 4 minutes   Cryotherapy   Number Minutes Cryotherapy 15 Minutes   Cryotherapy Location Knee   Type of Cryotherapy Other (comment)  Game ready 3 flakes medium   Electrical Stimulation   Electrical Stimulation Location LT knee   Electrical Stimulation Action IFC   Electrical Stimulation Parameters Concurrent with Game Ready   Electrical Stimulation Goals Pain   Ultrasound   Ultrasound Location Lt knee   Ultrasound Parameters 100%, 1.3w/cm2 3MZ   Ultrasound Goals Pain   Iontophoresis   Type of Iontophoresis Dexamethasone   Location Medial LT knee   Dose 1.5 ml of dex, Hybresis with activation   Time 8                     PT Long Term Goals - 02/20/15 1025  PT LONG TERM GOAL #1   Title Demonstrate understanding of RICE method   Time 4   Period Weeks   Status Achieved   PT LONG TERM GOAL #2   Title Independent in advanced HEP   Time 4   PT LONG TERM GOAL #3   Title Walk without limp, full weightbearing and pain 50% decreased   Time 4   Period Weeks   Status On-going  Still moderate limp during gait.   PT LONG TERM GOAL #4   Title Go up and down stairs step over step slowly and cautiously   Time 4   Period Weeks   Status Partially Met  Can do but painful later   PT LONG TERM GOAL #5   Title Get out of chair with 75% greater ease   Time 4   Period Weeks   Status On-going  25%   PT LONG TERM GOAL #7   Title Sit with minimal LT knee pain   Time 4   Period Weeks   Status Achieved               Plan - Mar 13, 2015 1026    Clinical Impression Statement Tried more modalities today to help reduce  pain. Edema seems to be less on visual inspection.   Pt will benefit from skilled therapeutic intervention in order to improve on the following deficits Abnormal gait;Decreased activity tolerance;Decreased mobility;Decreased strength;Increased edema;Decreased endurance;Decreased range of motion;Difficulty walking;Impaired flexibility;Improper body mechanics   Rehab Potential Good   PT Frequency 3x / week   PT Duration 4 weeks   PT Next Visit Plan Ionto #4, Ultrasound, gentle knee strength, measure ROM   Consulted and Agree with Plan of Care Patient          G-Codes - Mar 13, 2015 1032    Functional Assessment Tool Used FOTO 57%   Functional Limitation Mobility: Walking and moving around   Mobility: Walking and Moving Around Current Status [phone]) At least 40 percent but less than 60 percent impaired, limited or restricted   Mobility: Walking and Moving Around Goal Status ([id number]) At least 40 percent but less than 60 percent impaired, limited or restricted  Predicted FOTO score is 46% limitation    G-code goal is 46% limitation.  Problem List Patient Active Problem List   Diagnosis Date Noted  . B12 deficiency anemia   . Other emphysema 02/23/2014  . Necrobiosis lipoidica diabeticorum 12/31/2013  . Essential hypertension, benign 11/05/2013  . Routine health maintenance 09/27/2012  . Vitamin D deficiency 06/20/2011  . OBESITY, CLASS II 03/18/2011  . PULMONARY NODULE 07/12/2010  . DIABETES MELLITUS, TYPE II 11/02/2007  . HYPERLIPIDEMIA 10/01/2007  . ANXIETY 10/01/2007  . DEPRESSION 10/01/2007  . SLEEP APNEA 10/01/2007  . ARTHROSCOPY, KNEE, HX OF 10/01/2007   Earlie Counts, PT GRAY,CHERYL,PTA 13-Mar-2015, 10:50 AM  [facility] [address] [place], [place], [address] Phone: [phone]   Fax:  [phone]

## 2015-02-27 ENCOUNTER — Encounter: Payer: Self-pay | Admitting: Physical Therapy

## 2015-02-27 ENCOUNTER — Ambulatory Visit: Payer: Medicare Other | Admitting: Physical Therapy

## 2015-02-27 DIAGNOSIS — R609 Edema, unspecified: Secondary | ICD-10-CM

## 2015-02-27 DIAGNOSIS — M25662 Stiffness of left knee, not elsewhere classified: Secondary | ICD-10-CM

## 2015-02-27 DIAGNOSIS — M25562 Pain in left knee: Secondary | ICD-10-CM | POA: Diagnosis not present

## 2015-02-27 DIAGNOSIS — Z4789 Encounter for other orthopedic aftercare: Secondary | ICD-10-CM | POA: Diagnosis not present

## 2015-02-27 NOTE — Therapy (Signed)
[facility] [address] [place], [place], [address] Phone: [phone]   Fax:  [phone]  Physical Therapy Treatment  Patient Details  Name: Meredith [name] MRN: [id number] Date of Birth: 03-06-1951 Referring Provider:  Rowe Clack, MD  Encounter Date: 02/27/2015      PT End of Session - 02/27/15 1016    Visit Number 11   Number of Visits 12   Date for PT Re-Evaluation 03/28/15   PT Start Time 0935   PT Stop Time 2671   PT Time Calculation (min) 70 min      Past Medical History  Diagnosis Date  . Unspecified sleep apnea   . Other and unspecified hyperlipidemia   . Depressive disorder, not elsewhere classified   . Anxiety state, unspecified   . Asthma   . Diabetes mellitus without complication   . Anemia   . CIN I (cervical intraepithelial neoplasia I)   . B12 deficiency anemia 05/2014 dx  . Internal hemorrhoids   . Arthritis     KNEES  . COPD (chronic obstructive pulmonary disease)   . Emphysema of lung   . Sleep apnea   . Hypertension     Past Surgical History  Procedure Laterality Date  . Hemorrhoid surgery    . Cesarean section    . Knee arthroscopy    . Colonoscopy    . Upper gastrointestinal endoscopy      There were no vitals taken for this visit.  Visit Diagnosis:  Edema - Plan: PT plan of care cert/re-cert  Decreased range of motion of left knee - Plan: PT plan of care cert/re-cert  Knee pain, acute, left - Plan: PT plan of care cert/re-cert      Subjective Assessment - 02/27/15 0939    Symptoms The longer I am up doing things that is when pain increases. Continues with stiffness   Currently in Pain? Yes   Pain Score 3    Pain Location Knee   Pain Orientation Left   Pain Descriptors / Indicators Aching;Discomfort   Pain Type Surgical pain   Aggravating Factors  Bending, being up on it for long time.   Pain Relieving Factors ice, meds, NWB   Multiple Pain Sites No           [facility] PT Assessment - 02/27/15 0001    AROM   Overall AROM  --  10-116 degrees active in supine                  [facility] Adult PT Treatment/Exercise - 02/27/15 0001    Knee/Hip Exercises: Aerobic   Stationary Bike L1-2 x10   Ultrasound   Ultrasound Location LT knee   Ultrasound Parameters 100%, 1.3w/cm2 3MH   Ultrasound Goals Pain   Iontophoresis   Type of Iontophoresis Dexamethasone   Location Medial LT knee   Dose 1.5 ml of dex, Hybresis with activation   Time 8   Manual Therapy   Myofascial Release Including soft tissue to posterior LT knee                PT Education - 02/27/15 1016    Education provided Yes   Education Details Discussed pacing her household activities more at home.   Person(s) Educated Patient   Methods Explanation   Comprehension Verbalized understanding             PT Long Term Goals - 02/27/15 1020    PT LONG TERM GOAL #1  Title Demonstrate understanding of RICE method   Time 4   Period Weeks   Status Achieved   PT LONG TERM GOAL #2   Title Independent in advanced HEP   Time 4   Period Weeks   Status On-going  Continue to work towards   PT LONG TERM GOAL #3   Title Walk without limp, full weightbearing and pain 50% decreased   Time 4   Period Weeks   Status --  Continues with limp especially upon getting up from sitting. Pain 25% decreased.   PT LONG TERM GOAL #4   Title Go up and down stairs step over step slowly and cautiously   Time 4   Period Weeks   Status --  Can go up slowly, going down more slow and cautious but at times step to step. Depends upon day.   PT LONG TERM GOAL #5   Title Get out of chair with 75% greater ease   Time 4   Status --  25% improved   PT LONG TERM GOAL #7   Title Sit with minimal LT knee pain   Time 4   Period Weeks   Status Achieved   PT LONG TERM GOAL  #9   Status --  116 AROM flexion   PT LONG TERM GOAL  #10   Status --  -10 degrees extension                Plan - 02/27/15 1018    Clinical Impression Statement Pain still patient's greatest complaint   Pt will benefit from skilled therapeutic intervention in order to improve on the following deficits Abnormal gait;Decreased activity tolerance;Decreased mobility;Decreased strength;Increased edema;Decreased endurance;Decreased range of motion;Difficulty walking;Impaired flexibility;Improper body mechanics   Rehab Potential Good   PT Frequency 3x / week   PT Duration 4 weeks   PT Next Visit Plan PT to recert pt. One more ionto, work on strength, ROM, and decreasing pain.   Consulted and Agree with Plan of Care Patient        Problem List Patient Active Problem List   Diagnosis Date Noted  . B12 deficiency anemia   . Other emphysema 02/23/2014  . Necrobiosis lipoidica diabeticorum 12/31/2013  . Essential hypertension, benign 11/05/2013  . Routine health maintenance 09/27/2012  . Vitamin D deficiency 06/20/2011  . OBESITY, CLASS II 03/18/2011  . PULMONARY NODULE 07/12/2010  . DIABETES MELLITUS, TYPE II 11/02/2007  . HYPERLIPIDEMIA 10/01/2007  . ANXIETY 10/01/2007  . DEPRESSION 10/01/2007  . SLEEP APNEA 10/01/2007  . ARTHROSCOPY, KNEE, HX OF 10/01/2007   Earlie Counts, PT GRAY,CHERYL 02/27/2015, 12:12 PM  [facility] [address] [place], [place], [address] Phone: [phone]   Fax:  [phone]

## 2015-02-28 ENCOUNTER — Encounter: Payer: Self-pay | Admitting: Internal Medicine

## 2015-02-28 ENCOUNTER — Other Ambulatory Visit (INDEPENDENT_AMBULATORY_CARE_PROVIDER_SITE_OTHER): Payer: Medicare Other

## 2015-02-28 ENCOUNTER — Ambulatory Visit (INDEPENDENT_AMBULATORY_CARE_PROVIDER_SITE_OTHER): Payer: Medicare Other | Admitting: Internal Medicine

## 2015-02-28 VITALS — BP 142/72 | HR 80 | Temp 98.1°F | Resp 20 | Ht 65.0 in | Wt 241.0 lb

## 2015-02-28 DIAGNOSIS — D649 Anemia, unspecified: Secondary | ICD-10-CM

## 2015-02-28 DIAGNOSIS — D519 Vitamin B12 deficiency anemia, unspecified: Secondary | ICD-10-CM

## 2015-02-28 DIAGNOSIS — I10 Essential (primary) hypertension: Secondary | ICD-10-CM | POA: Diagnosis not present

## 2015-02-28 DIAGNOSIS — D531 Other megaloblastic anemias, not elsewhere classified: Secondary | ICD-10-CM | POA: Diagnosis not present

## 2015-02-28 DIAGNOSIS — E119 Type 2 diabetes mellitus without complications: Secondary | ICD-10-CM | POA: Diagnosis not present

## 2015-02-28 DIAGNOSIS — E785 Hyperlipidemia, unspecified: Secondary | ICD-10-CM

## 2015-02-28 LAB — COMPREHENSIVE METABOLIC PANEL
ALBUMIN: 3.9 g/dL (ref 3.5–5.2)
ALK PHOS: 75 U/L (ref 39–117)
ALT: 12 U/L (ref 0–35)
AST: 14 U/L (ref 0–37)
BUN: 13 mg/dL (ref 6–23)
CALCIUM: 9.5 mg/dL (ref 8.4–10.5)
CHLORIDE: 105 meq/L (ref 96–112)
CO2: 31 meq/L (ref 19–32)
CREATININE: 0.68 mg/dL (ref 0.40–1.20)
GFR: 92.26 mL/min (ref >60.00)
Glucose, Bld: 165 mg/dL — ABNORMAL HIGH (ref 70–99)
Potassium: 4.1 mEq/L (ref 3.5–5.1)
Sodium: 140 mEq/L (ref 135–145)
TOTAL PROTEIN: 7.3 g/dL (ref 6.0–8.3)
Total Bilirubin: 0.3 mg/dL (ref 0.2–1.2)

## 2015-02-28 LAB — FOLATE: FOLATE: 10.8 ng/mL (ref >5.9)

## 2015-02-28 LAB — LIPID PANEL
CHOL/HDL RATIO: 3
CHOLESTEROL: 145 mg/dL (ref 0–200)
HDL: 43.2 mg/dL (ref >39.00)
LDL CALC: 74 mg/dL (ref 0–99)
NonHDL: 101.8
TRIGLYCERIDES: 140 mg/dL (ref 0.0–149.0)
VLDL: 28 mg/dL (ref 0.0–40.0)

## 2015-02-28 LAB — FERRITIN: Ferritin: 19 ng/mL (ref 10.0–291.0)

## 2015-02-28 LAB — CBC
HCT: 34.7 % — ABNORMAL LOW (ref 36.0–46.0)
Hemoglobin: 11.4 g/dL — ABNORMAL LOW (ref 12.0–15.0)
MCHC: 32.7 g/dL (ref 30.0–36.0)
MCV: 86.2 fl (ref 78.0–100.0)
Platelets: 279 10*3/uL (ref 150.0–400.0)
RBC: 4.03 Mil/uL (ref 3.87–5.11)
RDW: 14.4 % (ref 11.5–15.5)
WBC: 7.1 10*3/uL (ref 4.0–10.5)

## 2015-02-28 LAB — VITAMIN B12: VITAMIN B 12: 427 pg/mL (ref 211–911)

## 2015-02-28 LAB — HEMOGLOBIN A1C: HEMOGLOBIN A1C: 7.6 % — AB (ref 4.6–6.5)

## 2015-02-28 NOTE — Patient Instructions (Signed)
We will check on the blood work today. We will call you back with the results.   Keep working on finding some exercise that you can do. Every 1 pound of weight lost takes 4 pounds of pressure off the knees.   The spot of numbness is most likely to be related to the surgery but can be related to the diabetes. We have given you some information about it.   We will see you back in about 6 months, if you have any problems or questions please feel free to call the office.   Diabetes Mellitus and Food It is important for you to manage your blood sugar (glucose) level. Your blood glucose level can be greatly affected by what you eat. Eating healthier foods in the appropriate amounts throughout the day at about the same time each day will help you control your blood glucose level. It can also help slow or prevent worsening of your diabetes mellitus. Healthy eating may even help you improve the level of your blood pressure and reach or maintain a healthy weight.  HOW CAN FOOD AFFECT ME? Carbohydrates Carbohydrates affect your blood glucose level more than any other type of food. Your dietitian will help you determine how many carbohydrates to eat at each meal and teach you how to count carbohydrates. Counting carbohydrates is important to keep your blood glucose at a healthy level, especially if you are using insulin or taking certain medicines for diabetes mellitus. Alcohol Alcohol can cause sudden decreases in blood glucose (hypoglycemia), especially if you use insulin or take certain medicines for diabetes mellitus. Hypoglycemia can be a life-threatening condition. Symptoms of hypoglycemia (sleepiness, dizziness, and disorientation) are similar to symptoms of having too much alcohol.  If your health care provider has given you approval to drink alcohol, do so in moderation and use the following guidelines:  Women should not have more than one drink per day, and men should not have more than two drinks per  day. One drink is equal to:  12 oz of beer.  5 oz of wine.  1 oz of hard liquor.  Do not drink on an empty stomach.  Keep yourself hydrated. Have water, diet soda, or unsweetened iced tea.  Regular soda, juice, and other mixers might contain a lot of carbohydrates and should be counted. WHAT FOODS ARE NOT RECOMMENDED? As you make food choices, it is important to remember that all foods are not the same. Some foods have fewer nutrients per serving than other foods, even though they might have the same number of calories or carbohydrates. It is difficult to get your body what it needs when you eat foods with fewer nutrients. Examples of foods that you should avoid that are high in calories and carbohydrates but low in nutrients include:  Trans fats (most processed foods list trans fats on the Nutrition Facts label).  Regular soda.  Juice.  Candy.  Sweets, such as cake, pie, doughnuts, and cookies.  Fried foods. WHAT FOODS CAN I EAT? Have nutrient-rich foods, which will nourish your body and keep you healthy. The food you should eat also will depend on several factors, including:  The calories you need.  The medicines you take.  Your weight.  Your blood glucose level.  Your blood pressure level.  Your cholesterol level. You also should eat a variety of foods, including:  Protein, such as meat, poultry, fish, tofu, nuts, and seeds (lean animal proteins are best).  Fruits.  Vegetables.  Dairy products, such  as milk, cheese, and yogurt (low fat is best).  Breads, grains, pasta, cereal, rice, and beans.  Fats such as olive oil, trans fat-free margarine, canola oil, avocado, and olives. DOES EVERYONE WITH DIABETES MELLITUS HAVE THE SAME MEAL PLAN? Because every person with diabetes mellitus is different, there is not one meal plan that works for everyone. It is very important that you meet with a dietitian who will help you create a meal plan that is just right for  you. Document Released: 09/12/2005 Document Revised: 12/21/2013 Document Reviewed: 11/12/2013 [facility] Patient Information 2015 [place], [place]. This information is not intended to replace advice given to you by your health care provider. Make sure you discuss any questions you have with your health care provider.

## 2015-02-28 NOTE — Progress Notes (Signed)
Pre visit review using our clinic review tool, if applicable. No additional management support is needed unless otherwise documented below in the visit note. 

## 2015-03-01 ENCOUNTER — Encounter: Payer: Self-pay | Admitting: Physical Therapy

## 2015-03-01 ENCOUNTER — Ambulatory Visit: Payer: Medicare Other | Attending: Orthopaedic Surgery | Admitting: Physical Therapy

## 2015-03-01 DIAGNOSIS — M25662 Stiffness of left knee, not elsewhere classified: Secondary | ICD-10-CM | POA: Insufficient documentation

## 2015-03-01 DIAGNOSIS — Z4789 Encounter for other orthopedic aftercare: Secondary | ICD-10-CM | POA: Diagnosis not present

## 2015-03-01 DIAGNOSIS — M25562 Pain in left knee: Secondary | ICD-10-CM | POA: Diagnosis not present

## 2015-03-01 DIAGNOSIS — R609 Edema, unspecified: Secondary | ICD-10-CM | POA: Insufficient documentation

## 2015-03-01 NOTE — Therapy (Signed)
[facility] [address], [place], [place], [address] Phone: [phone]   Fax:  [phone]  Physical Therapy Treatment  Patient Details  Name: Meredith [name] MRN: [id number] Date of Birth: 02-Oct-1951 Referring Provider:  Rowe Clack, MD  Encounter Date: 02/27/2015      PT End of Session - 03/01/15 1014    Visit Number 12   Number of Visits 25   Date for PT Re-Evaluation 03/28/15   PT Start Time 0931   PT Stop Time 1040   PT Time Calculation (min) 69 min   Activity Tolerance Patient tolerated treatment well   Behavior During Therapy [facility] for tasks assessed/performed      Past Medical History  Diagnosis Date  . Unspecified sleep apnea   . Other and unspecified hyperlipidemia   . Depressive disorder, not elsewhere classified   . Anxiety state, unspecified   . Asthma   . Diabetes mellitus without complication   . Anemia   . CIN I (cervical intraepithelial neoplasia I)   . B12 deficiency anemia 05/2014 dx  . Internal hemorrhoids   . Arthritis     KNEES  . COPD (chronic obstructive pulmonary disease)   . Emphysema of lung   . Sleep apnea   . Hypertension     Past Surgical History  Procedure Laterality Date  . Hemorrhoid surgery    . Cesarean section    . Knee arthroscopy    . Colonoscopy    . Upper gastrointestinal endoscopy      There were no vitals taken for this visit.  Visit Diagnosis:  Edema - Plan: PT plan of care cert/re-cert  Decreased range of motion of left knee - Plan: PT plan of care cert/re-cert  Knee pain, acute, left - Plan: PT plan of care cert/re-cert      Subjective Assessment - 03/01/15 0932    Symptoms Knee pain was much better after last treatment. Would like to focus on pain reduction again today.   Currently in Pain? Yes   Pain Score 3    Pain Location Knee   Pain Orientation Left   Pain Descriptors / Indicators Aching;Burning   Pain Type Surgical pain    Aggravating Factors  Being up on it too long, end of day.    Pain Relieving Factors Ultrasound, ice, rest   Multiple Pain Sites No                    [facility] Adult PT Treatment/Exercise - 03/01/15 0001    Knee/Hip Exercises: Aerobic   Stationary Bike L1-2 x10   Cryotherapy   Number Minutes Cryotherapy 15 Minutes   Cryotherapy Location Knee   Type of Cryotherapy Other (comment)  Game ready   Acupuncturist Location LT Knee   Electrical Stimulation Action IFC   Electrical Stimulation Parameters 80-150 Hz   Electrical Stimulation Goals Pain   Ultrasound   Ultrasound Location LT knee   Ultrasound Parameters 100% 3MH 1wt/cm2   Ultrasound Goals Pain   Iontophoresis   Type of Iontophoresis Dexamethasone   Location Medial LT knee   Dose 1.5 ml of dex, Hybresis with activation   Time 8   Manual Therapy   Myofascial Release Including soft tissue to posterior LT knee  Also retrograde massage to distal knee                     PT Long Term Goals -  02/27/15 1020    PT LONG TERM GOAL #1   Title Demonstrate understanding of RICE method   Time 4   Period Weeks   Status Achieved   PT LONG TERM GOAL #2   Title Independent in advanced HEP   Time 4   Period Weeks   Status On-going  Continue to work towards   PT LONG TERM GOAL #3   Title Walk without limp, full weightbearing and pain 50% decreased   Time 4   Period Weeks   Status --  Continues with limp especially upon getting up from sitting. Pain 25% decreased.   PT LONG TERM GOAL #4   Title Go up and down stairs step over step slowly and cautiously   Time 4   Period Weeks   Status --  Can go up slowly, going down more slow and cautious but at times step to step. Depends upon day.   PT LONG TERM GOAL #5   Title Get out of chair with 75% greater ease   Time 4   Status --  25% improved   PT LONG TERM GOAL #7   Title Sit with minimal LT knee pain   Time 4   Period  Weeks   Status Achieved   PT LONG TERM GOAL  #9   Status --  116 AROM flexion   PT LONG TERM GOAL  #10   Status --  -10 degrees extension               Plan - 03/01/15 1015    Clinical Impression Statement Patient reports less pain intensity and duration throughout the day as we have focused more on pain reduction.    Pt will benefit from skilled therapeutic intervention in order to improve on the following deficits Abnormal gait;Decreased activity tolerance;Decreased mobility;Decreased strength;Increased edema;Decreased endurance;Decreased range of motion;Difficulty walking;Impaired flexibility;Improper body mechanics   Rehab Potential Good   PT Frequency 3x / week   PT Duration 4 weeks   PT Treatment/Interventions Cryotherapy;Electrical Stimulation;Ultrasound;Moist Heat;Gait training;Functional mobility training;Neuromuscular re-education;Balance training;Therapeutic exercise;Therapeutic activities;Patient/family education;Manual techniques;Passive range of motion   PT Next Visit Plan Continue with pain reduction modalities and slowly add gentle exercises back into program.   Consulted and Agree with Plan of Care Patient        Problem List Patient Active Problem List   Diagnosis Date Noted  . B12 deficiency anemia   . Other emphysema 02/23/2014  . Necrobiosis lipoidica diabeticorum 12/31/2013  . Essential hypertension, benign 11/05/2013  . Routine health maintenance 09/27/2012  . Vitamin D deficiency 06/20/2011  . OBESITY, CLASS II 03/18/2011  . PULMONARY NODULE 07/12/2010  . DIABETES MELLITUS, TYPE II 11/02/2007  . HYPERLIPIDEMIA 10/01/2007  . ANXIETY 10/01/2007  . DEPRESSION 10/01/2007  . SLEEP APNEA 10/01/2007  . ARTHROSCOPY, KNEE, HX OF 10/01/2007    Francina Beery 03/01/2015, 10:37 AM  [facility] [address] [place], [place], [address] Phone: [phone]   Fax:  [phone]

## 2015-03-01 NOTE — Therapy (Signed)
[facility] [address], [place], [place], [address] Phone: [phone]   Fax:  [phone]  Physical Therapy Treatment  Patient Details  Name: Meredith [name] MRN: [id number] Date of Birth: [birth year] Referring Provider:  Rowe Clack, MD  Encounter Date: 03/01/2015      PT End of Session - 03/01/15 1014    Visit Number 12   Number of Visits 25   Date for PT Re-Evaluation 03/28/15   PT Start Time 0931   PT Stop Time 1040   PT Time Calculation (min) 69 min   Activity Tolerance Patient tolerated treatment well   Behavior During Therapy [facility] for tasks assessed/performed      Past Medical History  Diagnosis Date  . Unspecified sleep apnea   . Other and unspecified hyperlipidemia   . Depressive disorder, not elsewhere classified   . Anxiety state, unspecified   . Asthma   . Diabetes mellitus without complication   . Anemia   . CIN I (cervical intraepithelial neoplasia I)   . B12 deficiency anemia 05/2014 dx  . Internal hemorrhoids   . Arthritis     KNEES  . COPD (chronic obstructive pulmonary disease)   . Emphysema of lung   . Sleep apnea   . Hypertension     Past Surgical History  Procedure Laterality Date  . Hemorrhoid surgery    . Cesarean section    . Knee arthroscopy    . Colonoscopy    . Upper gastrointestinal endoscopy      There were no vitals taken for this visit.  Visit Diagnosis:  Edema  Decreased range of motion of left knee  Knee pain, acute, left      Subjective Assessment - 03/01/15 0932    Symptoms Knee pain was much better after last treatment. Would like to focus on pain reduction again today.   Currently in Pain? Yes   Pain Score 3    Pain Location Knee   Pain Orientation Left   Pain Descriptors / Indicators Aching;Burning   Pain Type Surgical pain   Aggravating Factors  Being up on it too long, end of day.    Pain Relieving Factors Ultrasound, ice, rest   Multiple  Pain Sites No                    [facility] Adult PT Treatment/Exercise - 03/01/15 0001    Knee/Hip Exercises: Aerobic   Stationary Bike L1-2 x10   Cryotherapy   Number Minutes Cryotherapy 15 Minutes   Cryotherapy Location Knee   Type of Cryotherapy Other (comment)  Game ready   Acupuncturist Location LT Knee   Electrical Stimulation Action IFC   Electrical Stimulation Parameters 80-150 Hz   Electrical Stimulation Goals Pain   Ultrasound   Ultrasound Location LT knee   Ultrasound Parameters 100% 3MH 1wt/cm2   Ultrasound Goals Pain   Iontophoresis   Type of Iontophoresis Dexamethasone   Location Medial LT knee   Dose 1.5 ml of dex, Hybresis with activation   Time 8   Manual Therapy   Myofascial Release Including soft tissue to posterior LT knee  Also retrograde massage to distal knee                     PT Long Term Goals - 02/27/15 1020    PT LONG TERM GOAL #1   Title Demonstrate understanding of RICE method   Time  4   Period Weeks   Status Achieved   PT LONG TERM GOAL #2   Title Independent in advanced HEP   Time 4   Period Weeks   Status On-going  Continue to work towards   PT LONG TERM GOAL #3   Title Walk without limp, full weightbearing and pain 50% decreased   Time 4   Period Weeks   Status --  Continues with limp especially upon getting up from sitting. Pain 25% decreased.   PT LONG TERM GOAL #4   Title Go up and down stairs step over step slowly and cautiously   Time 4   Period Weeks   Status --  Can go up slowly, going down more slow and cautious but at times step to step. Depends upon day.   PT LONG TERM GOAL #5   Title Get out of chair with 75% greater ease   Time 4   Status --  25% improved   PT LONG TERM GOAL #7   Title Sit with minimal LT knee pain   Time 4   Period Weeks   Status Achieved   PT LONG TERM GOAL  #9   Status --  116 AROM flexion   PT LONG TERM GOAL  #10   Status --   -10 degrees extension               Plan - 03/01/15 1015    Clinical Impression Statement Patient reports less pain intensity and duration throughout the day as we have focused more on pain reduction.    Pt will benefit from skilled therapeutic intervention in order to improve on the following deficits Abnormal gait;Decreased activity tolerance;Decreased mobility;Decreased strength;Increased edema;Decreased endurance;Decreased range of motion;Difficulty walking;Impaired flexibility;Improper body mechanics   Rehab Potential Good   PT Frequency 3x / week   PT Duration 4 weeks   PT Treatment/Interventions Cryotherapy;Electrical Stimulation;Ultrasound;Moist Heat;Gait training;Functional mobility training;Neuromuscular re-education;Balance training;Therapeutic exercise;Therapeutic activities;Patient/family education;Manual techniques;Passive range of motion   PT Next Visit Plan Continue with pain reduction modalities and slowly add gentle exercises back into program.   Consulted and Agree with Plan of Care Patient        Problem List Patient Active Problem List   Diagnosis Date Noted  . B12 deficiency anemia   . Other emphysema 02/23/2014  . Necrobiosis lipoidica diabeticorum 12/31/2013  . Essential hypertension, benign 11/05/2013  . Routine health maintenance 09/27/2012  . Vitamin D deficiency 06/20/2011  . OBESITY, CLASS II 03/18/2011  . PULMONARY NODULE 07/12/2010  . DIABETES MELLITUS, TYPE II 11/02/2007  . HYPERLIPIDEMIA 10/01/2007  . ANXIETY 10/01/2007  . DEPRESSION 10/01/2007  . SLEEP APNEA 10/01/2007  . ARTHROSCOPY, KNEE, HX OF 10/01/2007    COCHRAN,JENNIFER,PTA 03/01/2015, 10:21 AM  [facility] [address], [place], [place], [address] Phone: [phone]   Fax:  [phone]

## 2015-03-02 NOTE — Progress Notes (Signed)
   Subjective:    Patient ID: Meredith [name], female    DOB: [birth year], 64 y.o.   MRN: [id number]  HPI The patient is a 64 YO female here for follow up of several conditions: diabetes mellitus type 2 (well controlled on metfomin and irbesartan, no complication), hypertension (BP controlled on lasix, irbesartan, no known complications) and hyperlipidemia (controlled on vytorin, no side effects, no complications). She denies any changes to her medications or side effects. She is taking her medications as prescribed.   Review of Systems  Constitutional: Negative for fever, activity change, appetite change, fatigue and unexpected weight change.  HENT: Negative.   Eyes: Negative.   Respiratory: Negative for cough, chest tightness, shortness of breath and wheezing.   Cardiovascular: Negative for chest pain, palpitations and leg swelling.  Gastrointestinal: Negative for nausea, diarrhea, constipation and abdominal distention.  Musculoskeletal: Negative.   Neurological: Negative.   Psychiatric/Behavioral: Negative.       Objective:   Physical Exam  Constitutional: She is oriented to person, place, and time. She appears well-developed and well-nourished.  HENT:  Head: Normocephalic and atraumatic.  Eyes: EOM are normal.  Neck: Normal range of motion.  Cardiovascular: Normal rate and regular rhythm.   Pulmonary/Chest: Effort normal and breath sounds normal.  Abdominal: Soft. Bowel sounds are normal. She exhibits no distension.  Musculoskeletal: She exhibits no edema.  Neurological: She is alert and oriented to person, place, and time. Coordination normal.  Skin: Skin is warm and dry.  Psychiatric: She has a normal mood and affect.   Filed Vitals:   02/28/15 1012  BP: 142/72  Pulse: 80  Temp: 98.1 F (36.7 C)  TempSrc: Oral  Resp: 20  Height: 5\' 5"  (1.651 m)  Weight: 241 lb (109.317 kg)  SpO2: 95%      Assessment & Plan:

## 2015-03-02 NOTE — Assessment & Plan Note (Signed)
Eye exam up to date, controlled on metformin alone. Watches her diet. Foot exam done today, check HgA1c today. On ARB. No complications noted.

## 2015-03-02 NOTE — Assessment & Plan Note (Signed)
Goal LDL <70, on vytorin and check lipid panel today. Adjust as needed.

## 2015-03-02 NOTE — Assessment & Plan Note (Signed)
On ARB and lasix, may need titration as lasix is not an ideal blood pressure agent. For now okay and will check BMP and adjust if needed.

## 2015-03-06 ENCOUNTER — Encounter: Payer: Self-pay | Admitting: Physical Therapy

## 2015-03-06 ENCOUNTER — Ambulatory Visit: Payer: Medicare Other | Admitting: Physical Therapy

## 2015-03-06 DIAGNOSIS — Z4789 Encounter for other orthopedic aftercare: Secondary | ICD-10-CM | POA: Diagnosis not present

## 2015-03-06 DIAGNOSIS — R609 Edema, unspecified: Secondary | ICD-10-CM

## 2015-03-06 DIAGNOSIS — M25562 Pain in left knee: Secondary | ICD-10-CM

## 2015-03-06 DIAGNOSIS — M25662 Stiffness of left knee, not elsewhere classified: Secondary | ICD-10-CM

## 2015-03-06 NOTE — Therapy (Signed)
[facility] [address] [place], [place], [address] Phone: [phone]   Fax:  [phone]  Physical Therapy Treatment  Patient Details  Name: Meredith [name] MRN: [id number] Date of Birth: [birth year] Referring Provider:  Rowe Clack, MD  Encounter Date: 03/06/2015      PT End of Session - 03/06/15 0842    Visit Number 13   Number of Visits 25   Date for PT Re-Evaluation 03/28/15   PT Start Time 0800   PT Stop Time 0910   PT Time Calculation (min) 70 min   Behavior During Therapy [facility] for tasks assessed/performed      Past Medical History  Diagnosis Date  . Unspecified sleep apnea   . Other and unspecified hyperlipidemia   . Depressive disorder, not elsewhere classified   . Anxiety state, unspecified   . Asthma   . Diabetes mellitus without complication   . Anemia   . CIN I (cervical intraepithelial neoplasia I)   . B12 deficiency anemia 05/2014 dx  . Internal hemorrhoids   . Arthritis     KNEES  . COPD (chronic obstructive pulmonary disease)   . Emphysema of lung   . Sleep apnea   . Hypertension     Past Surgical History  Procedure Laterality Date  . Hemorrhoid surgery    . Cesarean section    . Knee arthroscopy    . Colonoscopy    . Upper gastrointestinal endoscopy      There were no vitals taken for this visit.  Visit Diagnosis:  Edema  Decreased range of motion of left knee  Knee pain, acute, left      Subjective Assessment - 03/06/15 0801    Symptoms Pt reports she was having numbness in LT lateral hip that is resolving. MD is aware of this. Intermittent knee pain over weekend,   Currently in Pain? Yes   Pain Score 3    Pain Location Knee   Pain Orientation Left   Pain Descriptors / Indicators Aching;Discomfort   Pain Type Surgical pain   Aggravating Factors  being up on it too long   Pain Relieving Factors [place], ice, rest   Multiple Pain Sites No                     [facility] Adult PT Treatment/Exercise - 03/06/15 0001    Knee/Hip Exercises: Aerobic   Stationary Bike L1-2 x10   Knee/Hip Exercises: Standing   Rocker Board 4 minutes   Rebounder weight shifting 3 ways 1 min each.  Had pain in LT knee where knee buckled.   Cryotherapy   Number Minutes Cryotherapy 15 Minutes   Cryotherapy Location Knee   Type of Cryotherapy --  Game ready 3 flakes medium   Electrical Stimulation   Electrical Stimulation Location LT knee   Electrical Stimulation Action IFC   Electrical Stimulation Parameters 80-150HZ    Electrical Stimulation Goals Pain   Ultrasound   Ultrasound Location LT knee   Ultrasound Parameters 100%, 3 MZ 1wtcm/2   Ultrasound Goals Pain   Iontophoresis   Type of Iontophoresis Dexamethasone   Location Medial LT knee   Dose 1.5 ml of dex, Hybresis with activation   Time 8   Manual Therapy   Myofascial Release Soft tissue work posterior LT knee, medial hamstring                     PT Long Term Goals - 03/06/15  0844    PT LONG TERM GOAL #1   Title Demonstrate understanding of RICE method   Time 4   Period Weeks   PT LONG TERM GOAL #2   Title Independent in advanced HEP   Time 4   Period Weeks   Status On-going  Working towards   PT LONG TERM GOAL #3   Title Walk without limp, full weightbearing and pain 50% decreased   Time 4   Period Weeks   Status On-going  Limp intermittent   PT LONG TERM GOAL #4   Title Go up and down stairs step over step slowly and cautiously   Time 4   Period Weeks   Status On-going  Variable with step to step, but can do slowly at times.               Plan - 03/06/15 0843    Clinical Impression Statement Lt knee buckle during tx due to knee pain.    Pt will benefit from skilled therapeutic intervention in order to improve on the following deficits Abnormal gait;Decreased activity tolerance;Decreased mobility;Decreased strength;Increased  edema;Decreased endurance;Decreased range of motion;Difficulty walking;Impaired flexibility;Improper body mechanics   Rehab Potential Good   PT Frequency 3x / week   PT Duration 4 weeks   PT Treatment/Interventions Cryotherapy;Electrical Stimulation;Ultrasound;Moist Heat;Gait training;Functional mobility training;Neuromuscular re-education;Balance training;Therapeutic exercise;Therapeutic activities;Patient/family education;Manual techniques;Passive range of motion   PT Next Visit Plan Continue with pain reduction modalities and slowly add gentle exercises back into program.   Consulted and Agree with Plan of Care Patient        Problem List Patient Active Problem List   Diagnosis Date Noted  . B12 deficiency anemia   . Other emphysema 02/23/2014  . Necrobiosis lipoidica diabeticorum 12/31/2013  . Essential hypertension, benign 11/05/2013  . Routine health maintenance 09/27/2012  . Vitamin D deficiency 06/20/2011  . OBESITY, CLASS II 03/18/2011  . PULMONARY NODULE 07/12/2010  . Diabetes mellitus type 2, controlled, without complications 54/98/2641  . Hyperlipidemia 10/01/2007  . ANXIETY 10/01/2007  . DEPRESSION 10/01/2007  . SLEEP APNEA 10/01/2007  . ARTHROSCOPY, KNEE, HX OF 10/01/2007    Albirtha Grinage, PTA 03/06/2015, 8:50 AM  [facility] [address] [place], [place], [address] Phone: [phone]   Fax:  [phone]

## 2015-03-08 ENCOUNTER — Ambulatory Visit: Payer: Medicare Other | Admitting: Physical Therapy

## 2015-03-08 ENCOUNTER — Encounter: Payer: Self-pay | Admitting: Physical Therapy

## 2015-03-08 DIAGNOSIS — Z4789 Encounter for other orthopedic aftercare: Secondary | ICD-10-CM | POA: Diagnosis not present

## 2015-03-08 DIAGNOSIS — R29898 Other symptoms and signs involving the musculoskeletal system: Secondary | ICD-10-CM

## 2015-03-08 DIAGNOSIS — M25562 Pain in left knee: Secondary | ICD-10-CM | POA: Diagnosis not present

## 2015-03-08 DIAGNOSIS — M25662 Stiffness of left knee, not elsewhere classified: Secondary | ICD-10-CM

## 2015-03-08 DIAGNOSIS — R609 Edema, unspecified: Secondary | ICD-10-CM

## 2015-03-08 NOTE — Therapy (Signed)
[facility] [address] [place], [place], [address] Phone: [phone]   Fax:  [phone]  Physical Therapy Treatment  Patient Details  Name: Meredith [name] MRN: [id number] Date of Birth: [birth year] Referring Provider:  Marybelle Killings, MD  Encounter Date: 03/08/2015      PT End of Session - 03/08/15 0908    Visit Number 14   Number of Visits 25   Date for PT Re-Evaluation 03/28/15   PT Start Time 0802   PT Stop Time 0925   PT Time Calculation (min) 83 min   Activity Tolerance Patient tolerated treatment well   Behavior During Therapy [facility] for tasks assessed/performed      Past Medical History  Diagnosis Date  . Unspecified sleep apnea   . Other and unspecified hyperlipidemia   . Depressive disorder, not elsewhere classified   . Anxiety state, unspecified   . Asthma   . Diabetes mellitus without complication   . Anemia   . CIN I (cervical intraepithelial neoplasia I)   . B12 deficiency anemia 05/2014 dx  . Internal hemorrhoids   . Arthritis     KNEES  . COPD (chronic obstructive pulmonary disease)   . Emphysema of lung   . Sleep apnea   . Hypertension     Past Surgical History  Procedure Laterality Date  . Hemorrhoid surgery    . Cesarean section    . Knee arthroscopy    . Colonoscopy    . Upper gastrointestinal endoscopy      There were no vitals taken for this visit.  Visit Diagnosis:  Edema  Decreased range of motion of left knee  Knee pain, acute, left  Decreased strength involving knee joint      Subjective Assessment - 03/08/15 0803    Symptoms Patient reports stretching more yesterday for extension and this helped. I feel liked  turned a corner yesterday, less stiff.   Currently in Pain? Yes   Pain Score 2    Pain Location Knee   Pain Orientation Left   Pain Descriptors / Indicators Aching;Discomfort   Pain Type Surgical pain   Aggravating Factors  Being up on it too long   Pain Relieving Factors [place], rest, ice, stretching          [facility] PT Assessment - 03/08/15 0001    AROM   Overall AROM  --  LT knee 10-118 degress                  [facility] Adult PT Treatment/Exercise - 03/08/15 0001    Knee/Hip Exercises: Stretches   Active Hamstring Stretch 4 reps;30 seconds  Used strap, VC for knee extension less hip flexion.    Knee/Hip Exercises: Aerobic   Stationary Bike L1-2 x10   Knee/Hip Exercises: Standing   Rocker Board 4 minutes   Rebounder weight shifting 3 ways 1 min each.  Had pain in LT knee where knee buckled.   Walking with Sports Cord 4 directions x10 20#   Knee/Hip Exercises: Supine   Heel Slides Both;1 set  Legs on red ball roled x1 min into flex/ext   Straight Leg Raises Strengthening;Left;3 sets;10 reps   Knee/Hip Exercises: Sidelying   Hip ABduction Strengthening;Left;3 sets;10 reps  VC for knee extension   Hip ABduction Limitations 1#   Cryotherapy   Number Minutes Cryotherapy 15 Minutes   Cryotherapy Location Knee   Type of Cryotherapy --  Game ready 3 flakes medium   Electrical  Stimulation   Electrical Stimulation Location LT knee   Electrical Stimulation Action Premod at Radiographer, therapeutic Parameters 80-_0    Electrical Stimulation Goals Pain   Ultrasound   Ultrasound Location Lt knee   Ultrasound Parameters 100%, 3MZ 1wtcm 8 min   Ultrasound Goals Pain                     PT Long Term Goals - 03/08/15 0916    PT LONG TERM GOAL #5   Title Get out of chair with 75% greater ease   Time 4   Period Weeks   Status Partially Met  50%   PT LONG TERM GOAL  #9   TITLE LT knee AROM >/= 120 degrees flexion   Time 4   Period Weeks   Status On-going  118 degress today active   PT LONG TERM GOAL  #10   TITLE LT knee AROM 0 degrees extension   Time 4   Period Weeks   Status On-going  Still limited by pain; 10 degrees of flexion               Plan - 03/08/15 0908    Clinical  Impression Statement Patient did excellent increasing activity today without any pain. Knee flexion improving, extension still limited by pain.   Pt will benefit from skilled therapeutic intervention in order to improve on the following deficits Abnormal gait;Decreased activity tolerance;Decreased mobility;Decreased strength;Increased edema;Decreased endurance;Decreased range of motion;Difficulty walking;Impaired flexibility;Improper body mechanics   Rehab Potential Good   PT Duration 4 weeks   PT Treatment/Interventions Cryotherapy;Electrical Stimulation;Ultrasound;Moist Heat;Gait training;Functional mobility training;Neuromuscular re-education;Balance training;Therapeutic exercise;Therapeutic activities;Patient/family education;Manual techniques;Passive range of motion   PT Next Visit Plan Continue with gentle exercises for knee strength and edema management.   Consulted and Agree with Plan of Care Patient        Problem List Patient Active Problem List   Diagnosis Date Noted  . B12 deficiency anemia   . Other emphysema 02/23/2014  . Necrobiosis lipoidica diabeticorum 12/31/2013  . Essential hypertension, benign 11/05/2013  . Routine health maintenance 09/27/2012  . Vitamin D deficiency 06/20/2011  . OBESITY, CLASS II 03/18/2011  . PULMONARY NODULE 07/12/2010  . Diabetes mellitus type 2, controlled, without complications 50/02/7047  . Hyperlipidemia 10/01/2007  . ANXIETY 10/01/2007  . DEPRESSION 10/01/2007  . SLEEP APNEA 10/01/2007  . ARTHROSCOPY, KNEE, HX OF 10/01/2007    Robyn Galati, PTA 03/08/2015, 9:29 AM  [facility] [address] [place], [place], [address] Phone: [phone]   Fax:  [phone]

## 2015-03-10 ENCOUNTER — Ambulatory Visit: Payer: Medicare Other | Admitting: Physical Therapy

## 2015-03-13 ENCOUNTER — Ambulatory Visit: Payer: Medicare Other | Admitting: Physical Therapy

## 2015-03-15 ENCOUNTER — Ambulatory Visit: Payer: Medicare Other | Admitting: Physical Therapy

## 2015-03-15 ENCOUNTER — Encounter: Payer: Self-pay | Admitting: Physical Therapy

## 2015-03-15 DIAGNOSIS — M25662 Stiffness of left knee, not elsewhere classified: Secondary | ICD-10-CM | POA: Diagnosis not present

## 2015-03-15 DIAGNOSIS — R609 Edema, unspecified: Secondary | ICD-10-CM

## 2015-03-15 DIAGNOSIS — M25562 Pain in left knee: Secondary | ICD-10-CM | POA: Diagnosis not present

## 2015-03-15 DIAGNOSIS — Z4789 Encounter for other orthopedic aftercare: Secondary | ICD-10-CM | POA: Diagnosis not present

## 2015-03-15 NOTE — Therapy (Signed)
[facility] [address], [place], [place], [address] Phone: [phone]   Fax:  [phone]  Physical Therapy Treatment  Patient Details  Name: RUMOR SUN MRN: [id number] Date of Birth: [birth year] Referring Provider:  Marybelle Killings, MD  Encounter Date: 03/15/2015      PT End of Session - 03/15/15 1054    Visit Number 15   Number of Visits 25   Date for PT Re-Evaluation 03/28/15   PT Start Time 1020   PT Stop Time 1115   PT Time Calculation (min) 55 min   Activity Tolerance --  Lt knee very swollen distally, very difficult to bend knee      Past Medical History  Diagnosis Date  . Unspecified sleep apnea   . Other and unspecified hyperlipidemia   . Depressive disorder, not elsewhere classified   . Anxiety state, unspecified   . Asthma   . Diabetes mellitus without complication   . Anemia   . CIN I (cervical intraepithelial neoplasia I)   . B12 deficiency anemia 05/2014 dx  . Internal hemorrhoids   . Arthritis     KNEES  . COPD (chronic obstructive pulmonary disease)   . Emphysema of lung   . Sleep apnea   . Hypertension     Past Surgical History  Procedure Laterality Date  . Hemorrhoid surgery    . Cesarean section    . Knee arthroscopy    . Colonoscopy    . Upper gastrointestinal endoscopy      There were no vitals filed for this visit.  Visit Diagnosis:  Edema  Knee pain, acute, left      Subjective Assessment - 03/15/15 1023    Symptoms Lt knee very swollen today, has been using crutches throughout the weekend. Had shooting pains laterally and it has just hurt to be up on it. Today first day off crutches.    Currently in Pain? Yes   Pain Score 3    Pain Location Knee   Pain Orientation Left   Pain Descriptors / Indicators Aching;Shooting;Dull   Pain Type Surgical pain   Aggravating Factors  Weight bearing or just doesn't know at times?   Pain Relieving Factors Non weight bearing  when pain is high.    Multiple Pain Sites No                       [facility] Adult PT Treatment/Exercise - 03/15/15 0001    Knee/Hip Exercises: Aerobic   Stationary Bike ROM only x10 min. Able to do full revolution.   Cryotherapy   Number Minutes Cryotherapy 20 Minutes   Cryotherapy Location Knee   Type of Cryotherapy --  Game ready 3 flakes medium   Electrical Stimulation   Electrical Stimulation Location LT knee   Electrical Stimulation Action IFC   Electrical Stimulation Parameters 1-10 HZ   Electrical Stimulation Goals Edema   Manual Therapy   Manual Therapy --  Retrograde massage for LE edema.LE elevated                PT Education - 03/15/15 1053    Education provided Yes   Education Details Ice and elevate throughout the day as she can, look into purchasing a knee high compression stocking.   Person(s) Educated Patient   Methods Explanation   Comprehension Verbalized understanding             PT Long Term Goals - 03/08/15 [id number]  PT LONG TERM GOAL #5   Title Get out of chair with 75% greater ease   Time 4   Period Weeks   Status Partially Met  50%   PT LONG TERM GOAL  #9   TITLE LT knee AROM >/= 120 degrees flexion   Time 4   Period Weeks   Status On-going  118 degress today active   PT LONG TERM GOAL  #10   TITLE LT knee AROM 0 degrees extension   Time 4   Period Weeks   Status On-going  Still limited by pain; 10 degrees of flexion               Plan - 03/15/15 1055    Clinical Impression Statement LT knee very swollen today. She just "got off crutches" today. Did nothing specific to bring pain on.    Pt will benefit from skilled therapeutic intervention in order to improve on the following deficits Abnormal gait;Decreased activity tolerance;Decreased mobility;Decreased strength;Increased edema;Decreased endurance;Decreased range of motion;Difficulty walking;Impaired flexibility;Improper body mechanics   Rehab Potential  Good   PT Frequency 3x / week   PT Duration 4 weeks   PT Treatment/Interventions Cryotherapy;Electrical Stimulation;Ultrasound;Moist Heat;Gait training;Functional mobility training;Neuromuscular re-education;Balance training;Therapeutic exercise;Therapeutic activities;Patient/family education;Manual techniques;Passive range of motion   PT Next Visit Plan TO MD next Wednesday, send note.   Consulted and Agree with Plan of Care Patient        Problem List Patient Active Problem List   Diagnosis Date Noted  . B12 deficiency anemia   . Other emphysema 02/23/2014  . Necrobiosis lipoidica diabeticorum 12/31/2013  . Essential hypertension, benign 11/05/2013  . Routine health maintenance 09/27/2012  . Vitamin D deficiency 06/20/2011  . OBESITY, CLASS II 03/18/2011  . PULMONARY NODULE 07/12/2010  . Diabetes mellitus type 2, controlled, without complications 66/48/3032  . Hyperlipidemia 10/01/2007  . ANXIETY 10/01/2007  . DEPRESSION 10/01/2007  . SLEEP APNEA 10/01/2007  . ARTHROSCOPY, KNEE, HX OF 10/01/2007    COCHRAN,JENNIFER, PTA 03/15/2015, 11:00 AM  [facility] [address] [place], [place], [address] Phone: [phone]   Fax:  [phone]

## 2015-03-17 ENCOUNTER — Encounter: Payer: Self-pay | Admitting: Physical Therapy

## 2015-03-17 ENCOUNTER — Ambulatory Visit: Payer: Medicare Other | Admitting: Physical Therapy

## 2015-03-17 DIAGNOSIS — M25662 Stiffness of left knee, not elsewhere classified: Secondary | ICD-10-CM | POA: Diagnosis not present

## 2015-03-17 DIAGNOSIS — M25562 Pain in left knee: Secondary | ICD-10-CM

## 2015-03-17 DIAGNOSIS — Z4789 Encounter for other orthopedic aftercare: Secondary | ICD-10-CM | POA: Diagnosis not present

## 2015-03-17 DIAGNOSIS — R609 Edema, unspecified: Secondary | ICD-10-CM

## 2015-03-17 NOTE — Therapy (Signed)
[facility] [address], [place], [place], [address] Phone: [phone]   Fax:  [phone]  Physical Therapy Treatment  Patient Details  Name: Meredith [name] MRN: [id number] Date of Birth: 1951-12-13 Referring Provider:  Rowe Clack, MD  Encounter Date: 03/17/2015      PT End of Session - 03/17/15 0840    Visit Number 16   Number of Visits 25   Date for PT Re-Evaluation 03/28/15   PT Start Time 0800   PT Stop Time 0900   PT Time Calculation (min) 60 min   Activity Tolerance Patient limited by pain   Behavior During Therapy --  Pt "down" regarding her condition.      Past Medical History  Diagnosis Date  . Unspecified sleep apnea   . Other and unspecified hyperlipidemia   . Depressive disorder, not elsewhere classified   . Anxiety state, unspecified   . Asthma   . Diabetes mellitus without complication   . Anemia   . CIN I (cervical intraepithelial neoplasia I)   . B12 deficiency anemia 05/2014 dx  . Internal hemorrhoids   . Arthritis     KNEES  . COPD (chronic obstructive pulmonary disease)   . Emphysema of lung   . Sleep apnea   . Hypertension     Past Surgical History  Procedure Laterality Date  . Hemorrhoid surgery    . Cesarean section    . Knee arthroscopy    . Colonoscopy    . Upper gastrointestinal endoscopy      There were no vitals filed for this visit.  Visit Diagnosis:  Edema  Knee pain, acute, left  Decreased range of motion of left knee      Subjective Assessment - 03/17/15 0800    Symptoms Last 24 hours slightly better than weekend, did have to go on crutches 1x. Cannot walk as long as she was. Seeing MD Wednesday   Currently in Pain? Yes   Pain Score 3    Pain Location Knee   Pain Orientation Left   Pain Descriptors / Indicators Aching;Constant;Dull;Sharp   Pain Type Surgical pain   Aggravating Factors  Weightbearing   Pain Relieving Factors NWB   Multiple  Pain Sites No            [facility] PT Assessment - 03/17/15 0001    AROM   Overall AROM  --  10-115 degrees initially, then after soft tissue 15-115 AROM                   [facility] Adult PT Treatment/Exercise - 03/17/15 0001    Knee/Hip Exercises: Aerobic   Stationary Bike L1-52mn with review of status   Knee/Hip Exercises: Standing   Rocker Board --  10x slowly   Knee/Hip Exercises: Supine   Heel Slides --  Legs on ball rocking 20x flex/Ext   Cryotherapy   Number Minutes Cryotherapy 20 Minutes   Cryotherapy Location Knee   Type of Cryotherapy --  Game ready 3 flakes medium   Electrical Stimulation   Electrical Stimulation Location Lt knee   Electrical Stimulation Action IFC   Electrical Stimulation Parameters 1-10 HZ   Electrical Stimulation Goals Edema   Manual Therapy   Manual Therapy --  Retrograde massage for LE edema.Soft tissue post knee                     PT Long Term Goals - 03/08/15 00349  PT LONG TERM GOAL #5   Title Get out of chair with 75% greater ease   Time 4   Period Weeks   Status Partially Met  50%   PT LONG TERM GOAL  #9   TITLE LT knee AROM >/= 120 degrees flexion   Time 4   Period Weeks   Status On-going  118 degress today active   PT LONG TERM GOAL  #10   TITLE LT knee AROM 0 degrees extension   Time 4   Period Weeks   Status On-going  Still limited by pain; 10 degrees of flexion               Plan - 03/17/15 0841    Clinical Impression Statement Lt knee still significantly swollen still today and remains with significant pain with any kind of weight bearing/walking or random.Marland KitchenMarland KitchenThe modalities help reduce pain & swelling for 12 hrs pt thinkgs.    Pt will benefit from skilled therapeutic intervention in order to improve on the following deficits Abnormal gait;Decreased activity tolerance;Decreased mobility;Decreased strength;Increased edema;Decreased endurance;Decreased range of motion;Difficulty  walking;Impaired flexibility;Improper body mechanics   Rehab Potential Good   PT Frequency 3x / week   PT Duration 4 weeks   PT Treatment/Interventions Cryotherapy;Electrical Stimulation;Ultrasound;Moist Heat;Gait training;Functional mobility training;Neuromuscular re-education;Balance training;Therapeutic exercise;Therapeutic activities;Patient/family education;Manual techniques;Passive range of motion   PT Next Visit Plan Write note on Monday   Consulted and Agree with Plan of Care Patient        Problem List Patient Active Problem List   Diagnosis Date Noted  . B12 deficiency anemia   . Other emphysema 02/23/2014  . Necrobiosis lipoidica diabeticorum 12/31/2013  . Essential hypertension, benign 11/05/2013  . Routine health maintenance 09/27/2012  . Vitamin D deficiency 06/20/2011  . OBESITY, CLASS II 03/18/2011  . PULMONARY NODULE 07/12/2010  . Diabetes mellitus type 2, controlled, without complications 50/08/3817  . Hyperlipidemia 10/01/2007  . ANXIETY 10/01/2007  . DEPRESSION 10/01/2007  . SLEEP APNEA 10/01/2007  . ARTHROSCOPY, KNEE, HX OF 10/01/2007    COCHRAN,JENNIFER, PTA 03/17/2015, 8:46 AM  [facility] [address] [place], [place], [address] Phone: [phone]   Fax:  [phone]

## 2015-03-17 NOTE — Patient Instructions (Signed)
Pt reports she plans to speak withMD regarding compression sock for LTLE.

## 2015-03-20 ENCOUNTER — Ambulatory Visit: Payer: Medicare Other | Admitting: Physical Therapy

## 2015-03-20 ENCOUNTER — Encounter: Payer: Self-pay | Admitting: Physical Therapy

## 2015-03-20 DIAGNOSIS — M25562 Pain in left knee: Secondary | ICD-10-CM | POA: Diagnosis not present

## 2015-03-20 DIAGNOSIS — M25662 Stiffness of left knee, not elsewhere classified: Secondary | ICD-10-CM | POA: Diagnosis not present

## 2015-03-20 DIAGNOSIS — R609 Edema, unspecified: Secondary | ICD-10-CM

## 2015-03-20 DIAGNOSIS — Z4789 Encounter for other orthopedic aftercare: Secondary | ICD-10-CM | POA: Diagnosis not present

## 2015-03-20 NOTE — Therapy (Signed)
[facility] [address] [place], [place], [address] Phone: [phone]   Fax:  [phone]  Physical Therapy Treatment  Patient Details  Name: Meredith [name] MRN: [id number] Date of Birth: [birth year] Referring Provider:  Marybelle Killings, MD  Encounter Date: 03/20/2015      PT End of Session - 03/20/15 0841    Visit Number 17   Number of Visits 25   Date for PT Re-Evaluation 03/28/15   PT Start Time 0800   PT Stop Time 0900   PT Time Calculation (min) 60 min   Activity Tolerance Patient limited by pain      Past Medical History  Diagnosis Date  . Unspecified sleep apnea   . Other and unspecified hyperlipidemia   . Depressive disorder, not elsewhere classified   . Anxiety state, unspecified   . Asthma   . Diabetes mellitus without complication   . Anemia   . CIN I (cervical intraepithelial neoplasia I)   . B12 deficiency anemia 05/2014 dx  . Internal hemorrhoids   . Arthritis     KNEES  . COPD (chronic obstructive pulmonary disease)   . Emphysema of lung   . Sleep apnea   . Hypertension     Past Surgical History  Procedure Laterality Date  . Hemorrhoid surgery    . Cesarean section    . Knee arthroscopy    . Colonoscopy    . Upper gastrointestinal endoscopy      There were no vitals filed for this visit.  Visit Diagnosis:  Edema  Knee pain, acute, left  Decreased range of motion of left knee      Subjective Assessment - 03/20/15 0800    Symptoms Did well until she walked a small bit in [employer]. After that pain cycled intermittently throught weekend. Going to MD Wednesday.    How long can you walk comfortably? Not long   Currently in Pain? Yes   Pain Score 3    Pain Location Knee   Pain Orientation Left   Pain Descriptors / Indicators Aching;Discomfort   Pain Type Surgical pain   Aggravating Factors  Walking and general weightbearing   Pain Relieving Factors NWB   Multiple Pain Sites No             [facility] PT Assessment - 03/20/15 0001    AROM   Overall AROM  --  10-118 degrees supine   PROM   Overall PROM  --  8- 120 passive but painful on extension                   [facility] Adult PT Treatment/Exercise - 03/20/15 0001    Knee/Hip Exercises: Stretches   Active Hamstring Stretch 4 reps;10 seconds   Knee/Hip Exercises: Aerobic   Stationary Bike L1-2 x10   Knee/Hip Exercises: Supine   Heel Slides --  Legs on ball rocking 20x flex/Ext   Cryotherapy   Number Minutes Cryotherapy 20 Minutes   Cryotherapy Location Knee   Type of Cryotherapy --  Gameready 3 flakes medium    Electrical Stimulation   Electrical Stimulation Location Lt knee   Electrical Stimulation Action IFC   Electrical Stimulation Parameters 1-_0    Electrical Stimulation Goals Edema   Manual Therapy   Manual Therapy --  Retrograde massage for LE edema.Soft tissue post knee                     PT Long Term Goals -  03/20/15 0842    PT LONG TERM GOAL #1   Title Demonstrate understanding of RICE method   Time 4   Period Weeks   Status Achieved   PT LONG TERM GOAL #2   Title Independent in advanced HEP   Time 4   Period Weeks   Status On-going   PT LONG TERM GOAL #3   Title Walk without limp, full weightbearing and pain 50% decreased   Time 4   Period Weeks   Status On-going  Walks more with limp as pain increases/later in day.   PT LONG TERM GOAL #4   Title Go up and down stairs step over step slowly and cautiously   Time 4   Period Weeks   Status On-going  Comes down sideways, still slow and cautious   PT LONG TERM GOAL #5   Title Get out of chair with 75% greater ease   Time 4   Period Weeks   Status Partially Met   PT LONG TERM GOAL #7   Title Sit with minimal LT knee pain   Time 4   Period Weeks   Status Achieved   PT LONG TERM GOAL  #9   TITLE LT knee AROM >/= 120 degrees flexion   Time 4   Status On-going  10-118 AROM   PT LONG TERM GOAL   #10   TITLE LT knee AROM 0 degrees extension   Period Weeks   Status On-going               Plan - 03/20/15 2841    Clinical Impression Statement To MD this wednesday   Pt will benefit from skilled therapeutic intervention in order to improve on the following deficits Abnormal gait;Decreased activity tolerance;Decreased mobility;Decreased strength;Increased edema;Decreased endurance;Decreased range of motion;Difficulty walking;Impaired flexibility;Improper body mechanics   Rehab Potential Good   PT Frequency 3x / week   PT Duration 4 weeks   PT Treatment/Interventions Cryotherapy;Electrical Stimulation;Ultrasound;Moist Heat;Gait training;Functional mobility training;Neuromuscular re-education;Balance training;Therapeutic exercise;Therapeutic activities;Patient/family education;Manual techniques;Passive range of motion   PT Next Visit Plan See what MD says   Consulted and Agree with Plan of Care Patient        Problem List Patient Active Problem List   Diagnosis Date Noted  . B12 deficiency anemia   . Other emphysema 02/23/2014  . Necrobiosis lipoidica diabeticorum 12/31/2013  . Essential hypertension, benign 11/05/2013  . Routine health maintenance 09/27/2012  . Vitamin D deficiency 06/20/2011  . OBESITY, CLASS II 03/18/2011  . PULMONARY NODULE 07/12/2010  . Diabetes mellitus type 2, controlled, without complications 32/44/0102  . Hyperlipidemia 10/01/2007  . ANXIETY 10/01/2007  . DEPRESSION 10/01/2007  . SLEEP APNEA 10/01/2007  . ARTHROSCOPY, KNEE, HX OF 10/01/2007    Peg Fifer, PTA 03/20/2015, 8:46 AM  [facility] [address], [place], [place], [address] Phone: [phone]   Fax:  [phone]

## 2015-03-22 ENCOUNTER — Ambulatory Visit: Payer: Medicare Other | Admitting: Physical Therapy

## 2015-03-22 DIAGNOSIS — S83242D Other tear of medial meniscus, current injury, left knee, subsequent encounter: Secondary | ICD-10-CM | POA: Diagnosis not present

## 2015-03-22 DIAGNOSIS — M25562 Pain in left knee: Secondary | ICD-10-CM | POA: Diagnosis not present

## 2015-03-27 ENCOUNTER — Ambulatory Visit: Payer: Medicare Other | Admitting: Physical Therapy

## 2015-03-27 ENCOUNTER — Encounter: Payer: Self-pay | Admitting: Physical Therapy

## 2015-03-27 DIAGNOSIS — R609 Edema, unspecified: Secondary | ICD-10-CM | POA: Diagnosis not present

## 2015-03-27 DIAGNOSIS — Z4789 Encounter for other orthopedic aftercare: Secondary | ICD-10-CM | POA: Diagnosis not present

## 2015-03-27 DIAGNOSIS — M25662 Stiffness of left knee, not elsewhere classified: Secondary | ICD-10-CM

## 2015-03-27 DIAGNOSIS — M25562 Pain in left knee: Secondary | ICD-10-CM

## 2015-03-27 NOTE — Therapy (Addendum)
[facility] [address] [place], [place], [address] Phone: [phone]   Fax:  [phone]  Physical Therapy Treatment  Patient Details  Name: Meredith [name] MRN: [id number] Date of Birth: [birth year] Referring Provider:  Marybelle Killings, MD  Encounter Date: 03/27/2015      PT End of Session - 03/27/15 0835    Visit Number 18   Number of Visits 25   Date for PT Re-Evaluation 04/25/15   PT Start Time 0800   PT Stop Time 0850   PT Time Calculation (min) 50 min   Activity Tolerance Patient limited by pain   Behavior During Therapy [facility] for tasks assessed/performed      Past Medical History  Diagnosis Date  . Unspecified sleep apnea   . Other and unspecified hyperlipidemia   . Depressive disorder, not elsewhere classified   . Anxiety state, unspecified   . Asthma   . Diabetes mellitus without complication   . Anemia   . CIN I (cervical intraepithelial neoplasia I)   . B12 deficiency anemia 05/2014 dx  . Internal hemorrhoids   . Arthritis     KNEES  . COPD (chronic obstructive pulmonary disease)   . Emphysema of lung   . Sleep apnea   . Hypertension     Past Surgical History  Procedure Laterality Date  . Hemorrhoid surgery    . Cesarean section    . Knee arthroscopy    . Colonoscopy    . Upper gastrointestinal endoscopy      There were no vitals filed for this visit.  Visit Diagnosis:  Edema - Plan: PT plan of care cert/re-cert  Knee pain, acute, left - Plan: PT plan of care cert/re-cert  Decreased range of motion of left knee - Plan: PT plan of care cert/re-cert      Subjective Assessment - 03/27/15 0804    Symptoms Saw MD and received cortisone shot which has helped. He gave her a "50/50%" chance of the cortisone working. Talking potential knee relacement at some time.    Currently in Pain? Yes   Pain Score 1    Pain Location Knee   Pain Orientation Left   Pain Descriptors / Indicators  Aching;Discomfort;Dull   Pain Type Surgical pain   Aggravating Factors  Walking and weight bearing   Pain Relieving Factors NWB   Multiple Pain Sites No            [facility] PT Assessment - 03/27/15 0001    Balance Screen   Has the patient fallen in the past 6 months No   Has the patient had a decrease in activity level because of a fear of falling?  No   Is the patient reluctant to leave their home because of a fear of falling?  No   Prior Function   Level of Independence Independent with basic ADLs   AROM   Overall AROM  --  15-120 degrees active supine     G-code is CK, 50% improvement for G8980 for Mobility: walking and moving around, expected G-code was CK. Earlie Counts, PT 08/04/2015 11:28 AM                [facility] Adult PT Treatment/Exercise - 03/27/15 0001    Knee/Hip Exercises: Aerobic   Stationary Bike L1 x 10 min   Knee/Hip Exercises: Supine   Heel Slides --  Legs on ball rocking 20x flex/Ext   Cryotherapy   Number Minutes Cryotherapy 20 Minutes  Cryotherapy Location Knee   Type of Cryotherapy --  Gameready 3 flakes medium   Electrical Stimulation   Electrical Stimulation Location Lt knee   Electrical Stimulation Action IFC   Electrical Stimulation Parameters 1-10 HZ   Electrical Stimulation Goals Edema   Manual Therapy   Manual Therapy --  Retrograde massage for LE edema.Soft tissue post knee                     PT Long Term Goals - 03/27/15 1144    PT LONG TERM GOAL #1   Title Demonstrate understanding of RICE method   Period Weeks   Status Achieved   PT LONG TERM GOAL #2   Title Independent in advanced HEP   Time 4   Period Weeks   Status On-going   PT LONG TERM GOAL #3   Title Walk without limp, full weightbearing and pain 50% decreased   Time 4   Period Weeks   Status On-going   PT LONG TERM GOAL #4   Title Go up and down stairs step over step slowly and cautiously   Time 4   Status On-going   PT LONG TERM GOAL #5    Title Get out of chair with 75% greater ease   Time 4   Period Weeks   Status Partially Met   PT LONG TERM GOAL #7   Title Sit with minimal LT knee pain   Time 4   Period Weeks   Status Achieved   PT LONG TERM GOAL  #9   TITLE LT knee AROM >/= 120 degrees flexion   Time 4   Period Weeks   Status Achieved   PT LONG TERM GOAL  #10   TITLE LT knee AROM 0 degrees extension   Time 4   Period Weeks   Status On-going               Plan - 03/27/15 3716    Clinical Impression Statement Patient's swelling down from cortisone shot.    Pt will benefit from skilled therapeutic intervention in order to improve on the following deficits Abnormal gait;Decreased activity tolerance;Decreased mobility;Decreased strength;Increased edema;Decreased endurance;Decreased range of motion;Difficulty walking;Impaired flexibility;Improper body mechanics   Rehab Potential Good   PT Frequency 3x / week   PT Duration 4 weeks   PT Treatment/Interventions Cryotherapy;Electrical Stimulation;Ultrasound;Moist Heat;Gait training;Functional mobility training;Neuromuscular re-education;Balance training;Therapeutic exercise;Therapeutic activities;Patient/family education;Manual techniques;Passive range of motion   PT Next Visit Plan Renew and discuss with PT. Patient to see MD in one month   Consulted and Agree with Plan of Care Patient        Problem List Patient Active Problem List   Diagnosis Date Noted  . B12 deficiency anemia   . Other emphysema 02/23/2014  . Necrobiosis lipoidica diabeticorum 12/31/2013  . Essential hypertension, benign 11/05/2013  . Routine health maintenance 09/27/2012  . Vitamin D deficiency 06/20/2011  . OBESITY, CLASS II 03/18/2011  . PULMONARY NODULE 07/12/2010  . Diabetes mellitus type 2, controlled, without complications 96/78/9381  . Hyperlipidemia 10/01/2007  . ANXIETY 10/01/2007  . DEPRESSION 10/01/2007  . SLEEP APNEA 10/01/2007  . ARTHROSCOPY, KNEE, HX OF  10/01/2007   Earlie Counts, PT 03/27/2015 1:28 PM  Myrene Galas, PTA 03/27/2015 1:28 PM   [facility] [address], [place], [place], [address] Phone: [phone]   Fax:  [phone]     PHYSICAL THERAPY DISCHARGE SUMMARY  Visits from Start of Care: 18 Current functional  level related to goals / functional outcomes: Patient did not return to therapy after last visit on 04/25/2015. Patient was going to see MD in May but have not heard from her. Patient has met all goals except for #10 due to extension -15 degrees. Goal # 5 not met due to still having difficulty getting out of a chair. Goal # 4 not met due to difficulty with steps. Goal #3 not met due to waling with a limp.    Remaining deficits: See above.   Education / Equipment: HEP Plan:                                                    Patient goals were partially met. Patient is being discharged due to not returning since the last visit. Thank you for the referral. Earlie Counts, PT 08/04/2015 11:22 AM   ?????

## 2015-03-28 ENCOUNTER — Ambulatory Visit (INDEPENDENT_AMBULATORY_CARE_PROVIDER_SITE_OTHER): Payer: Medicare Other | Admitting: *Deleted

## 2015-03-28 DIAGNOSIS — E538 Deficiency of other specified B group vitamins: Secondary | ICD-10-CM

## 2015-03-28 MED ORDER — CYANOCOBALAMIN 1000 MCG/ML IJ SOLN
1000.0000 ug | Freq: Once | INTRAMUSCULAR | Status: AC
  Administered 2015-03-28: 1000 ug via INTRAMUSCULAR

## 2015-03-29 ENCOUNTER — Ambulatory Visit: Payer: Medicare Other | Admitting: Physical Therapy

## 2015-03-31 ENCOUNTER — Ambulatory Visit: Payer: Medicare Other | Admitting: Physical Therapy

## 2015-04-26 DIAGNOSIS — M25562 Pain in left knee: Secondary | ICD-10-CM | POA: Diagnosis not present

## 2015-04-28 ENCOUNTER — Ambulatory Visit: Payer: Medicare Other

## 2015-05-03 ENCOUNTER — Ambulatory Visit: Payer: Medicare Other

## 2015-05-24 ENCOUNTER — Ambulatory Visit (INDEPENDENT_AMBULATORY_CARE_PROVIDER_SITE_OTHER): Payer: Medicare Other

## 2015-05-24 DIAGNOSIS — D519 Vitamin B12 deficiency anemia, unspecified: Secondary | ICD-10-CM

## 2015-05-24 MED ORDER — CYANOCOBALAMIN 1000 MCG/ML IJ SOLN
1000.0000 ug | Freq: Once | INTRAMUSCULAR | Status: AC
  Administered 2015-05-24: 1000 ug via INTRAMUSCULAR

## 2015-06-21 ENCOUNTER — Ambulatory Visit: Payer: BC Managed Care – PPO | Admitting: Gynecology

## 2015-06-22 ENCOUNTER — Ambulatory Visit (INDEPENDENT_AMBULATORY_CARE_PROVIDER_SITE_OTHER): Payer: Medicare Other | Admitting: *Deleted

## 2015-06-22 DIAGNOSIS — E538 Deficiency of other specified B group vitamins: Secondary | ICD-10-CM | POA: Diagnosis not present

## 2015-06-22 MED ORDER — CYANOCOBALAMIN 1000 MCG/ML IJ SOLN
1000.0000 ug | Freq: Once | INTRAMUSCULAR | Status: AC
  Administered 2015-06-22: 1000 ug via INTRAMUSCULAR

## 2015-06-26 ENCOUNTER — Other Ambulatory Visit: Payer: Self-pay

## 2015-06-29 ENCOUNTER — Ambulatory Visit: Payer: BC Managed Care – PPO | Admitting: Obstetrics and Gynecology

## 2015-06-29 ENCOUNTER — Encounter: Payer: Self-pay | Admitting: Internal Medicine

## 2015-06-29 ENCOUNTER — Other Ambulatory Visit (INDEPENDENT_AMBULATORY_CARE_PROVIDER_SITE_OTHER): Payer: Medicare Other

## 2015-06-29 ENCOUNTER — Ambulatory Visit (INDEPENDENT_AMBULATORY_CARE_PROVIDER_SITE_OTHER): Payer: Medicare Other | Admitting: Internal Medicine

## 2015-06-29 VITALS — BP 126/84 | HR 74 | Temp 98.4°F | Resp 12 | Ht 65.0 in | Wt 232.8 lb

## 2015-06-29 DIAGNOSIS — E119 Type 2 diabetes mellitus without complications: Secondary | ICD-10-CM

## 2015-06-29 DIAGNOSIS — R5383 Other fatigue: Secondary | ICD-10-CM | POA: Diagnosis not present

## 2015-06-29 DIAGNOSIS — R42 Dizziness and giddiness: Secondary | ICD-10-CM

## 2015-06-29 LAB — CBC
HCT: 35.5 % — ABNORMAL LOW (ref 36.0–46.0)
HEMOGLOBIN: 11.5 g/dL — AB (ref 12.0–15.0)
MCHC: 32.4 g/dL (ref 30.0–36.0)
MCV: 86.4 fl (ref 78.0–100.0)
PLATELETS: 295 10*3/uL (ref 150.0–400.0)
RBC: 4.11 Mil/uL (ref 3.87–5.11)
RDW: 14.9 % (ref 11.5–15.5)
WBC: 6.9 10*3/uL (ref 4.0–10.5)

## 2015-06-29 LAB — COMPREHENSIVE METABOLIC PANEL
ALT: 14 U/L (ref 0–35)
AST: 16 U/L (ref 0–37)
Albumin: 4.1 g/dL (ref 3.5–5.2)
Alkaline Phosphatase: 74 U/L (ref 39–117)
BUN: 10 mg/dL (ref 6–23)
CO2: 28 mEq/L (ref 19–32)
Calcium: 9.4 mg/dL (ref 8.4–10.5)
Chloride: 103 mEq/L (ref 96–112)
Creatinine, Ser: 0.63 mg/dL (ref 0.40–1.20)
GFR: 100.65 mL/min (ref >60.00)
Glucose, Bld: 92 mg/dL (ref 70–99)
Potassium: 3.9 mEq/L (ref 3.5–5.1)
SODIUM: 139 meq/L (ref 135–145)
Total Bilirubin: 0.4 mg/dL (ref 0.2–1.2)
Total Protein: 7.7 g/dL (ref 6.0–8.3)

## 2015-06-29 LAB — HEMOGLOBIN A1C: Hgb A1c MFr Bld: 7.1 % — ABNORMAL HIGH (ref 4.6–6.5)

## 2015-06-29 LAB — TSH: TSH: 1.83 u[IU]/mL (ref 0.35–4.50)

## 2015-06-29 MED ORDER — ESCITALOPRAM OXALATE 10 MG PO TABS: 10.0000 mg | ORAL_TABLET | Freq: Every day | ORAL | Status: DC

## 2015-06-29 MED ORDER — IRBESARTAN 300 MG PO TABS: 300.0000 mg | ORAL_TABLET | Freq: Every day | ORAL | Status: DC

## 2015-06-29 MED ORDER — FUROSEMIDE 20 MG PO TABS: 20.0000 mg | ORAL_TABLET | Freq: Every day | ORAL | Status: DC

## 2015-06-29 MED ORDER — EZETIMIBE-SIMVASTATIN 10-40 MG PO TABS: 1.0000 | ORAL_TABLET | Freq: Every day | ORAL | Status: DC

## 2015-06-29 MED ORDER — METFORMIN HCL 1000 MG PO TABS: 1000.0000 mg | ORAL_TABLET | Freq: Two times a day (BID) | ORAL | Status: DC

## 2015-06-29 NOTE — Progress Notes (Signed)
Pre visit review using our clinic review tool, if applicable. No additional management support is needed unless otherwise documented below in the visit note. 

## 2015-06-29 NOTE — Patient Instructions (Signed)
We will check some blood work today and call you back with the results.   I have given you some information about exercises for the dizziness that should help.   Diabetes and Exercise Exercising regularly is important. It is not just about losing weight. It has many health benefits, such as:  Improving your overall fitness, flexibility, and endurance.  Increasing your bone density.  Helping with weight control.  Decreasing your body fat.  Increasing your muscle strength.  Reducing stress and tension.  Improving your overall health. People with diabetes who exercise gain additional benefits because exercise:  Reduces appetite.  Improves the body's use of blood sugar (glucose).  Helps lower or control blood glucose.  Decreases blood pressure.  Helps control blood lipids (such as cholesterol and triglycerides).  Improves the body's use of the hormone insulin by:  Increasing the body's insulin sensitivity.  Reducing the body's insulin needs.  Decreases the risk for heart disease because exercising:  Lowers cholesterol and triglycerides levels.  Increases the levels of good cholesterol (such as high-density lipoproteins [HDL]) in the body.  Lowers blood glucose levels. YOUR ACTIVITY PLAN  Choose an activity that you enjoy and set realistic goals. Your health care provider or diabetes educator can help you make an activity plan that works for you. Exercise regularly as directed by your health care provider. This includes:  Performing resistance training twice a week such as push-ups, sit-ups, lifting weights, or using resistance bands.  Performing 150 minutes of cardio exercises each week such as walking, running, or playing sports.  Staying active and spending no more than 90 minutes at one time being inactive. Even short bursts of exercise are good for you. Three 10-minute sessions spread throughout the day are just as beneficial as a single 30-minute session. Some  exercise ideas include:  Taking the dog for a walk.  Taking the stairs instead of the elevator.  Dancing to your favorite song.  Doing an exercise video.  Doing your favorite exercise with a friend. RECOMMENDATIONS FOR EXERCISING WITH TYPE 1 OR TYPE 2 DIABETES   Check your blood glucose before exercising. If blood glucose levels are greater than 240 mg/dL, check for urine ketones. Do not exercise if ketones are present.  Avoid injecting insulin into areas of the body that are going to be exercised. For example, avoid injecting insulin into:  The arms when playing tennis.  The legs when jogging.  Keep a record of:  Food intake before and after you exercise.  Expected peak times of insulin action.  Blood glucose levels before and after you exercise.  The type and amount of exercise you have done.  Review your records with your health care provider. Your health care provider will help you to develop guidelines for adjusting food intake and insulin amounts before and after exercising.  If you take insulin or oral hypoglycemic agents, watch for signs and symptoms of hypoglycemia. They include:  Dizziness.  Shaking.  Sweating.  Chills.  Confusion.  Drink plenty of water while you exercise to prevent dehydration or heat stroke. Body water is lost during exercise and must be replaced.  Talk to your health care provider before starting an exercise program to make sure it is safe for you. Remember, almost any type of activity is better than none. Document Released: 03/07/2004 Document Revised: 05/02/2014 Document Reviewed: 05/25/2013 [facility] Patient Information 2015 [place], [place]. This information is not intended to replace advice given to you by your health care provider. Make sure  you discuss any questions you have with your health care provider.

## 2015-06-30 ENCOUNTER — Telehealth: Payer: Self-pay | Admitting: Internal Medicine

## 2015-06-30 DIAGNOSIS — H8110 Benign paroxysmal vertigo, unspecified ear: Secondary | ICD-10-CM | POA: Insufficient documentation

## 2015-06-30 NOTE — Telephone Encounter (Signed)
Patient is calling for the result of her lab work, leave message if not home

## 2015-06-30 NOTE — Assessment & Plan Note (Signed)
She could not recall but phone note from the episode lists her HR as 56 during episode which would likely not be low enough to cause symptoms. Has taken her medicine today already and HR 74 so will not adjust medication. CBC recently checked and normal. If recurs will initiate further workup or dosage adjustment of her beta blocker. BP also normal during episode. Not on diabetic medicine that could drop sugar.

## 2015-06-30 NOTE — Progress Notes (Signed)
   Subjective:    Patient ID: Meredith [name], female    DOB: 10-12-1951, 64 y.o.   MRN: [id number]  HPI The patient is a 64 YO female coming in for one episode of dizziness. It felt like she was going to pass out. She was in her kitchen and doing dishes. She sat down and the feeling passed in a few minutes. She did not lose consciousness. Has never happened again. Had eaten as usual that day and taken her medicines as usual. Nothing out of the ordinary.   Review of Systems  Constitutional: Negative for fever, activity change, appetite change, fatigue and unexpected weight change.  Respiratory: Negative for cough, chest tightness, shortness of breath and wheezing.   Cardiovascular: Negative for chest pain, palpitations and leg swelling.  Gastrointestinal: Negative for nausea, diarrhea, constipation and abdominal distention.  Musculoskeletal: Negative.   Neurological: Positive for dizziness. Negative for tremors, seizures, syncope, facial asymmetry, weakness, light-headedness, numbness and headaches.  Psychiatric/Behavioral: Negative.       Objective:   Physical Exam  Constitutional: She is oriented to person, place, and time. She appears well-developed and well-nourished.  HENT:  Head: Normocephalic and atraumatic.  Eyes: EOM are normal.  Neck: Normal range of motion.  Cardiovascular: Normal rate and regular rhythm.   Pulmonary/Chest: Effort normal and breath sounds normal.  Abdominal: Soft. Bowel sounds are normal. She exhibits no distension.  Musculoskeletal: She exhibits no edema.  Neurological: She is alert and oriented to person, place, and time. Coordination normal.  Skin: Skin is warm and dry.  Psychiatric: She has a normal mood and affect.   Filed Vitals:   06/29/15 1325  BP: 126/84  Pulse: 74  Temp: 98.4 F (36.9 C)  TempSrc: Oral  Resp: 12  Height: 5\' 5"  (1.651 m)  Weight: 232 lb 12.8 oz (105.597 kg)  SpO2: 97%      Assessment & Plan:

## 2015-07-20 ENCOUNTER — Ambulatory Visit: Payer: Medicare Other

## 2015-08-09 ENCOUNTER — Ambulatory Visit: Payer: Self-pay | Admitting: Obstetrics and Gynecology

## 2015-08-16 ENCOUNTER — Other Ambulatory Visit: Payer: Self-pay | Admitting: Internal Medicine

## 2015-08-17 ENCOUNTER — Other Ambulatory Visit: Payer: Self-pay | Admitting: Internal Medicine

## 2015-08-22 ENCOUNTER — Other Ambulatory Visit: Payer: Self-pay

## 2015-08-22 DIAGNOSIS — Z1231 Encounter for screening mammogram for malignant neoplasm of breast: Secondary | ICD-10-CM

## 2015-08-23 ENCOUNTER — Ambulatory Visit (INDEPENDENT_AMBULATORY_CARE_PROVIDER_SITE_OTHER): Payer: Medicare Other

## 2015-08-23 DIAGNOSIS — D649 Anemia, unspecified: Secondary | ICD-10-CM

## 2015-08-23 MED ORDER — CYANOCOBALAMIN 1000 MCG/ML IJ SOLN
1000.0000 ug | Freq: Once | INTRAMUSCULAR | Status: AC
  Administered 2015-08-23: 1000 ug via INTRAMUSCULAR

## 2015-08-28 ENCOUNTER — Ambulatory Visit (INDEPENDENT_AMBULATORY_CARE_PROVIDER_SITE_OTHER): Payer: Medicare Other | Admitting: Obstetrics and Gynecology

## 2015-08-28 ENCOUNTER — Encounter: Payer: Self-pay | Admitting: Obstetrics and Gynecology

## 2015-08-28 VITALS — BP 130/70 | HR 70 | Resp 20 | Ht 65.0 in | Wt 234.0 lb

## 2015-08-28 DIAGNOSIS — Z01419 Encounter for gynecological examination (general) (routine) without abnormal findings: Secondary | ICD-10-CM

## 2015-08-28 DIAGNOSIS — R312 Other microscopic hematuria: Secondary | ICD-10-CM | POA: Diagnosis not present

## 2015-08-28 DIAGNOSIS — R3129 Other microscopic hematuria: Secondary | ICD-10-CM

## 2015-08-28 DIAGNOSIS — Z124 Encounter for screening for malignant neoplasm of cervix: Secondary | ICD-10-CM

## 2015-08-28 DIAGNOSIS — Z1151 Encounter for screening for human papillomavirus (HPV): Secondary | ICD-10-CM | POA: Diagnosis not present

## 2015-08-28 LAB — POCT URINALYSIS DIPSTICK
Bilirubin, UA: NEGATIVE
Glucose, UA: NEGATIVE
Ketones, UA: NEGATIVE
Leukocytes, UA: NEGATIVE
Nitrite, UA: NEGATIVE
Protein, UA: NEGATIVE
Urobilinogen, UA: NEGATIVE
pH, UA: 5

## 2015-08-28 NOTE — Addendum Note (Signed)
Addended by: Lowella Fairy on: 08/28/2015 01:33 PM   Modules accepted: Orders

## 2015-08-28 NOTE — Patient Instructions (Signed)

## 2015-08-28 NOTE — Addendum Note (Signed)
Addended by: Yisroel Ramming, BROOK E on: 08/28/2015 10:14 AM   Modules accepted: Orders

## 2015-08-28 NOTE — Progress Notes (Addendum)
Patient ID: [name], female   DOB: [birth year], 64 y.o.   MRN: [id number] 64 y.o. No obstetric history on file. Divorced Caucasian female here for annual exam.    No bleeding or spotting.   Wants a urine dip today.  Hx of asymptomatic infection.  Periodic cloudiness and odor to urine.  Counselor for drug addiction.   Has twins 65 years old.   PCP:   Vertell Novak, MD  No LMP recorded. Patient is postmenopausal.          Sexually active: No.  The current method of family planning is post menopausal status.    Exercising: No.   Smoker:  Former  Health Maintenance: Pap:  06-15-14 Neg:Pos. HR HPV History of abnormal Pap:  Yes, Pap 06/15/14 - normal and positive HR HPV.  Hx colposcopy 07-20-14 ECC revealed LGSIL--neg P16; 11/2012 pap Neg:Pos HR HPV with colposcopy revealing CIN I MMG:  09-23-14 [facility]/Neg:[facility].  Has appointment.  Colonoscopy:  12-05-14 benign polyp with Dr. Delfin Edis.  Patient unsure when next colon due -- ?5 to 10 years. BMD:   Unsure    TDaP:  09-23-12 Screening Labs:  Hb today: PCP, Urine today: PCP   reports that she has quit smoking. Her smoking use included Cigarettes. She has a 7 pack-year smoking history. She quit smokeless tobacco use about 22 months ago. She reports that she drinks about 3.0 - 3.6 oz of alcohol per week. She reports that she does not use illicit drugs.  Past Medical History  Diagnosis Date  . Unspecified sleep apnea   . Other and unspecified hyperlipidemia   . Depressive disorder, not elsewhere classified   . Anxiety state, unspecified   . Asthma   . Diabetes mellitus without complication   . Anemia   . CIN I (cervical intraepithelial neoplasia I)   . B12 deficiency anemia 05/2014 dx  . Internal hemorrhoids   . Arthritis     KNEES  . COPD (chronic obstructive pulmonary disease)   . Emphysema of lung   . Sleep apnea   . Hypertension     Past Surgical History  Procedure Laterality Date  . Hemorrhoid surgery     . Cesarean section    . Knee arthroscopy Left 12/2014  . Colonoscopy    . Upper gastrointestinal endoscopy      Current Outpatient Prescriptions  Medication Sig Dispense Refill  . ALPRAZolam (NIRAVAM) 0.5 MG dissolvable tablet Take 1 tablet (0.5 mg total) by mouth every 6 (six) hours as needed for anxiety (increased anxiety). 20 tablet 0  . aspirin 81 MG tablet Take 81 mg by mouth daily.      . clobetasol ointment (TEMOVATE) 0.05 %   3  . Cyanocobalamin (VITAMIN B-12 IJ) Inject as directed. Once a month    . escitalopram (LEXAPRO) 10 MG tablet Take 1 tablet (10 mg total) by mouth daily. 90 tablet 3  . escitalopram (LEXAPRO) 10 MG tablet TAKE 1 TABLET BY MOUTH DAILY 90 tablet 0  . ezetimibe-simvastatin (VYTORIN) 10-40 MG per tablet Take 1 tablet by mouth daily. 90 tablet 3  . furosemide (LASIX) 20 MG tablet Take 1 tablet (20 mg total) by mouth daily. 90 tablet 3  . irbesartan (AVAPRO) 300 MG tablet Take 1 tablet (300 mg total) by mouth daily. 90 tablet 3  . metFORMIN (GLUCOPHAGE) 1000 MG tablet Take 1 tablet (1,000 mg total) by mouth 2 (two) times daily with a meal. TAKE 1 TABLET BY MOUTH TWICE  DAILY WITH A MEAL 180 tablet 3  . Multiple Vitamin (MULTIVITAMIN) capsule Take 1 capsule by mouth daily.    . Omega-3 Fatty Acids (FISH OIL) 1200 MG CAPS Take 2 capsules by mouth daily.      Marland Kitchen VYTORIN 10-40 MG per tablet TAKE 1 TABLET BY MOUTH DAILY 90 tablet 0   No current facility-administered medications for this visit.    Family History  Problem Relation Age of Onset  . Heart attack Father 56  . Diabetes Father   . Cancer Father     lung  . Heart disease Father     ROS:  Pertinent items are noted in HPI.  Otherwise, a comprehensive ROS was negative.  Exam:   BP 130/70 mmHg  Pulse 70  Resp 20  Ht 5\' 5"  (1.651 m)  Wt 234 lb (106.142 kg)  BMI 38.94 kg/m2    General appearance: alert, cooperative and appears stated age Head: Normocephalic, without obvious abnormality,  atraumatic Neck: no adenopathy, supple, symmetrical, trachea midline and thyroid normal to inspection and palpation Lungs: clear to auscultation bilaterally Breasts: normal appearance, no masses or tenderness, Inspection negative, No nipple retraction or dimpling, No nipple discharge or bleeding, No axillary or supraclavicular adenopathy Heart: regular rate and rhythm Abdomen: soft, non-tender; bowel sounds normal; no masses,  no organomegaly Extremities: extremities normal, atraumatic, no cyanosis or edema Skin: Skin color, texture, turgor normal. No rashes or lesions Lymph nodes: Cervical, supraclavicular, and axillary nodes normal. No abnormal inguinal nodes palpated Neurologic: Grossly normal  Pelvic: External genitalia:  no lesions              Urethra:  normal appearing urethra with no masses, tenderness or lesions              Bartholins and Skenes: normal                 Vagina: normal appearing vagina with normal color and discharge, no lesions              Cervix: no lesions              Pap taken: Yes.   Bimanual Exam:  Uterus:  normal size, contour, position, consistency, mobility, non-tender              Adnexa: normal adnexa and no mass, fullness, tenderness              Rectovaginal: No..   Declined rectal exam.  Chaperone was present for exam.  Assessment:   Well woman visit with normal exam. Hx LGSIL.  Plan: Yearly mammogram recommended after age 65.  Recommended self breast exam.  Pap and HR HPV as above. Discussed Calcium, Vitamin D, regular exercise program including cardiovascular and weight bearing exercise. Labs performed.  Yes.  .   See orders.  Desires urine dip.  Refills given on medications.  No..  See orders. Follow up annually and prn.      After visit summary provided.

## 2015-08-29 LAB — URINE CULTURE
COLONY COUNT: NO GROWTH
Organism ID, Bacteria: NO GROWTH

## 2015-08-30 ENCOUNTER — Ambulatory Visit (INDEPENDENT_AMBULATORY_CARE_PROVIDER_SITE_OTHER): Payer: Medicare Other | Admitting: Internal Medicine

## 2015-08-30 ENCOUNTER — Encounter: Payer: Self-pay | Admitting: Internal Medicine

## 2015-08-30 VITALS — BP 138/80 | HR 76 | Temp 98.5°F | Resp 18 | Ht 65.0 in | Wt 237.4 lb

## 2015-08-30 DIAGNOSIS — Z780 Asymptomatic menopausal state: Secondary | ICD-10-CM

## 2015-08-30 DIAGNOSIS — I10 Essential (primary) hypertension: Secondary | ICD-10-CM

## 2015-08-30 DIAGNOSIS — H8113 Benign paroxysmal vertigo, bilateral: Secondary | ICD-10-CM | POA: Diagnosis not present

## 2015-08-30 MED ORDER — ALPRAZOLAM 0.5 MG PO TBDP: 0.5000 mg | ORAL_TABLET | Freq: Four times a day (QID) | ORAL | Status: DC | PRN

## 2015-08-30 NOTE — Progress Notes (Signed)
Pre visit review using our clinic review tool, if applicable. No additional management support is needed unless otherwise documented below in the visit note. 

## 2015-08-30 NOTE — Patient Instructions (Signed)
We will get you in for the rehab on your ear canals. You will hear back about this.   We will also get you in for the bone density scan.   Benign Positional Vertigo Vertigo means you feel like you or your surroundings are moving when they are not. Benign positional vertigo is the most common form of vertigo. Benign means that the cause of your condition is not serious. Benign positional vertigo is more common in older adults. CAUSES  Benign positional vertigo is the result of an upset in the labyrinth system. This is an area in the middle ear that helps control your balance. This may be caused by a viral infection, head injury, or repetitive motion. However, often no specific cause is found. SYMPTOMS  Symptoms of benign positional vertigo occur when you move your head or eyes in different directions. Some of the symptoms may include:  Loss of balance and falls.  Vomiting.  Blurred vision.  Dizziness.  Nausea.  Involuntary eye movements (nystagmus). DIAGNOSIS  Benign positional vertigo is usually diagnosed by physical exam. If the specific cause of your benign positional vertigo is unknown, your caregiver may perform imaging tests, such as magnetic resonance imaging (MRI) or computed tomography (CT). TREATMENT  Your caregiver may recommend movements or procedures to correct the benign positional vertigo. Medicines such as meclizine, benzodiazepines, and medicines for nausea may be used to treat your symptoms. In rare cases, if your symptoms are caused by certain conditions that affect the inner ear, you may need surgery. HOME CARE INSTRUCTIONS   Follow your caregiver's instructions.  Move slowly. Do not make sudden body or head movements.  Avoid driving.  Avoid operating heavy machinery.  Avoid performing any tasks that would be dangerous to you or others during a vertigo episode.  Drink enough fluids to keep your urine clear or pale yellow. SEEK IMMEDIATE MEDICAL CARE IF:    You develop problems with walking, weakness, numbness, or using your arms, hands, or legs.  You have difficulty speaking.  You develop severe headaches.  Your nausea or vomiting continues or gets worse.  You develop visual changes.  Your family or friends notice any behavioral changes.  Your condition gets worse.  You have a fever.  You develop a stiff neck or sensitivity to light. MAKE SURE YOU:   Understand these instructions.  Will watch your condition.  Will get help right away if you are not doing well or get worse. Document Released: 09/23/2006 Document Revised: 03/09/2012 Document Reviewed: 09/05/2011 [facility] Patient Information 2015 [place], [place]. This information is not intended to replace advice given to you by your health care provider. Make sure you discuss any questions you have with your health care provider.

## 2015-08-31 LAB — IPS PAP TEST WITH HPV

## 2015-08-31 NOTE — Assessment & Plan Note (Signed)
BP at goal on lasix and irbesartan. Reviewed recent labs and no indication for change. Not likely contributing to her BPPV as BP is not too high or too low and she has taken her medicine today already.

## 2015-08-31 NOTE — Progress Notes (Signed)
   Subjective:    Patient ID: Meredith [name], female    DOB: 11-14-1951, 64 y.o.   MRN: [id number]  HPI The patient is a 64 YO female coming in for follow up of her BPPV. About 2-3 months ago it started and was initially intense for days. Since that time she has tried the exercises I gave her last time. These were mildly helpful. Still getting vertigo daily and anytime she bends down and straightens it happens as well as flipping in bed. No ear pain or drainage. No fevers or chills. Nothing has been helpful. This is severe and is causing her to not be able to do many things around the house and keeps her from going out at times.   Review of Systems  Constitutional: Negative for fever, activity change, appetite change, fatigue and unexpected weight change.  Respiratory: Negative for cough, chest tightness, shortness of breath and wheezing.   Cardiovascular: Negative for chest pain, palpitations and leg swelling.  Gastrointestinal: Negative for nausea, diarrhea, constipation and abdominal distention.  Musculoskeletal: Negative.   Neurological: Positive for dizziness. Negative for tremors, seizures, syncope, facial asymmetry, weakness, light-headedness, numbness and headaches.  Psychiatric/Behavioral: Negative.       Objective:   Physical Exam  Constitutional: She is oriented to person, place, and time. She appears well-developed and well-nourished.  HENT:  Head: Normocephalic and atraumatic.  Eyes: EOM are normal.  Neck: Normal range of motion.  Cardiovascular: Normal rate and regular rhythm.   Pulmonary/Chest: Effort normal and breath sounds normal.  Abdominal: Soft. Bowel sounds are normal. She exhibits no distension.  Musculoskeletal: She exhibits no edema.  Neurological: She is alert and oriented to person, place, and time. Coordination normal.  Skin: Skin is warm and dry.  Psychiatric: She has a normal mood and affect.   Filed Vitals:   08/30/15 1045  BP: 138/80  Pulse: 76  Temp:  98.5 F (36.9 C)  TempSrc: Oral  Resp: 18  Height: 5\' 5"  (1.651 m)  Weight: 237 lb 6.4 oz (107.684 kg)  SpO2: 97%      Assessment & Plan:

## 2015-08-31 NOTE — Assessment & Plan Note (Signed)
Refer to PT for vestibular rehab as she is not able to get good results on her own. Talked to her about the fact that she can use meclizine prn only for needing to go out. Available OTC.

## 2015-09-01 ENCOUNTER — Other Ambulatory Visit: Payer: Self-pay | Admitting: Internal Medicine

## 2015-09-05 ENCOUNTER — Telehealth: Payer: Self-pay | Admitting: Emergency Medicine

## 2015-09-05 DIAGNOSIS — R87810 Cervical high risk human papillomavirus (HPV) DNA test positive: Secondary | ICD-10-CM

## 2015-09-05 MED ORDER — METRONIDAZOLE 0.75 % VA GEL: VAGINAL | Status: DC

## 2015-09-05 NOTE — Telephone Encounter (Signed)
-----   Message from Nunzio Cobbs, MD sent at 08/31/2015  7:48 PM EDT ----- Please report pap showing normal cells but positive HR HPV Patient also had signs of bacterial vaginosis.   I am recommending a colposcopy again.  (She had one last year and it showed LGSIL.)  I do recommend treatment of the bacterial vaginosis. Options are Flagyl 500 mg po bid for 7 days or Metrogel 0.75% pv at hs for 5 nights.  Please send choice to her pharmacy of choice.   Thank you!  Cc- Marisa Sprinkles

## 2015-09-05 NOTE — Telephone Encounter (Signed)
Message left to return call to [place] at [phone].   Normal PAP with Positive HR HPV this year and last year. Post menopausal.

## 2015-09-05 NOTE — Telephone Encounter (Signed)
Patient returned call. Message from Dr. Quincy Simmonds given and patient verbalized understanding of results.  Instructions given for Metrogel PV at hs for 5 nights. Alcohol precautions given. Denies current BV symptoms.   Colposcopy scheduled for 09/20/15 at 1000. Brief description of procedure given, patient remembers prior procedure with Dr. Charlies Constable. Pre-procedure instructions given. Motrin 800 mg PO one hour before appointment with food and water.   cc Kerry Hough for pre-certification.  Routing to provider for final review. Patient agreeable to disposition. Will close encounter.

## 2015-09-08 DIAGNOSIS — E119 Type 2 diabetes mellitus without complications: Secondary | ICD-10-CM | POA: Diagnosis not present

## 2015-09-08 DIAGNOSIS — E11319 Type 2 diabetes mellitus with unspecified diabetic retinopathy without macular edema: Secondary | ICD-10-CM | POA: Diagnosis not present

## 2015-09-08 DIAGNOSIS — H25813 Combined forms of age-related cataract, bilateral: Secondary | ICD-10-CM | POA: Diagnosis not present

## 2015-09-14 ENCOUNTER — Telehealth: Payer: Self-pay | Admitting: Obstetrics and Gynecology

## 2015-09-14 NOTE — Telephone Encounter (Signed)
Called patient to review benefits for procedure. Patient identified herself on voicemail "please leave name, number and brief message". Left voicemail stating my name and department, patient benefit and confirmation of arrival date/time. No specific appointment details left. Left return call number for any questions. Ok to close.

## 2015-09-20 ENCOUNTER — Telehealth: Payer: Self-pay | Admitting: Internal Medicine

## 2015-09-20 ENCOUNTER — Ambulatory Visit (INDEPENDENT_AMBULATORY_CARE_PROVIDER_SITE_OTHER): Payer: Medicare Other | Admitting: Obstetrics and Gynecology

## 2015-09-20 ENCOUNTER — Encounter: Payer: Self-pay | Admitting: Obstetrics and Gynecology

## 2015-09-20 DIAGNOSIS — R87613 High grade squamous intraepithelial lesion on cytologic smear of cervix (HGSIL): Secondary | ICD-10-CM | POA: Diagnosis not present

## 2015-09-20 DIAGNOSIS — R87810 Cervical high risk human papillomavirus (HPV) DNA test positive: Secondary | ICD-10-CM

## 2015-09-20 NOTE — Telephone Encounter (Signed)
Patient was in on 8/31 and prescription for ALPRAZolam (NIRAVAM) 0.5 MG dissolvable tablet [[id number]] was supposed to be called in to [facility] on [address] and they didn't get it. Can you please resend prescription

## 2015-09-20 NOTE — Progress Notes (Signed)
Subjective:     Patient ID: Meredith [name], female   DOB: 09-Mar-1951, 64 y.o.   MRN: [id number]  HPI  Patient here today for colposcopy.  Pap smear 08-28-15 Neg:Pos HR HPV.  History 06-15-14 pap Neg:Pos HR HPV with colposcopy ECC -- LGSIL,Neg P16.  Pap smear 11/2012 Neg:Pos HR HPV with colposcopy -- CIN I.  Hx of preterm labor.   No DES exposure.   Hx tobacco use in past.  Review of Systems  LMP:  12/30/1998 Contraception: Postmenopausal     Objective:   Physical Exam  Genitourinary:     Colposcopy Consent obtained.  Speculum placed in cervix.  3% acetic acid used.  Cervix difficulty to identify.  Looks flush with the vaginal cuff.  Os even difficulty to see.  Os looks like a fold and the actual canal a challenge to find.   Endocervical speculum used.  Colposcopy unsatisfactory.  ECC attempted and tissue to pathology.  Lugol's staining and patient had generalized decreased uptake. Biopsy at 12:00 cervical/vaginal junction.  Sent to pathology. Monsel's applied.  Minimal EBL.  No complications.     Assessment:     Normal pap. Positive HR HPV.  Prior colpo showing LGSIL.  Unsatisfactory colposcopy.    Plan:     Discussion of abnormal paps, HPV, colposcopy, and LEEP procedure.   Discussion of her cervical anatomy and the unsatisfactory colposcopy.  I have discussed with the patient the possible second opinion with GYN oncology as I believe that a LEEP or conization would be a challenge due to her anatomy.   ___15____ minutes face to face time of which over 50% was spent in counseling.   After visit summary to patient.

## 2015-09-20 NOTE — Patient Instructions (Signed)
Colposcopy, Care After Refer to this sheet in the next few weeks. These instructions provide you with information on caring for yourself after your procedure. Your health care Meredith [name] may also give you more specific instructions. Your treatment has been planned according to current medical practices, but problems sometimes occur. Call your health care Meredith [name] if you have any problems or questions after your procedure. WHAT TO EXPECT AFTER THE PROCEDURE  After your procedure, it is typical to have the following:  Cramping. This often goes away in a few minutes.  Soreness. This may last for 2 days.  Lightheadedness. Lie down for a few minutes if this occurs. You may also have some bleeding or dark discharge for a few days. You may need to wear a sanitary pad during this time. HOME CARE INSTRUCTIONS  Avoid sex, douching, and using tampons for 3 days or as directed by your health care Meredith [name].  Only take over-the-counter or prescription medicines as directed by your health care Meredith [name]. Do not take aspirin because it can cause bleeding.  Continue to take birth control pills if you are on them.  Not all test results are available during your visit. If your test results are not back during the visit, make an appointment with your health care Meredith [name] to find out the results. Do not assume everything is normal if you have not heard from your health care Meredith [name] or the medical facility. It is important for you to follow up on all of your test results.  Follow your health care Meredith [name]'s advice regarding activity, follow-up visits, and follow-up Pap tests. SEEK MEDICAL CARE IF:  You develop a rash.  You have problems with your medicine. SEEK IMMEDIATE MEDICAL CARE IF:  You are bleeding heavily or are passing blood clots.  You have a fever.  You have abnormal vaginal discharge.  You are having cramps that do not go away after taking your pain medicine.  You feel lightheaded, dizzy, or  faint.  You have stomach pain. Document Released: 10/06/2013 Document Reviewed: 10/06/2013 [employer] Patient Information 2015 [employer]. This information is not intended to replace advice given to you by your health care Meredith [name]. Make sure you discuss any questions you have with your health care Meredith [name].  

## 2015-09-21 MED ORDER — ALPRAZOLAM 0.5 MG PO TBDP: 0.5000 mg | ORAL_TABLET | Freq: Four times a day (QID) | ORAL | Status: DC | PRN

## 2015-09-21 NOTE — Telephone Encounter (Signed)
Faxed

## 2015-09-21 NOTE — Telephone Encounter (Signed)
Reprinted, please fax.

## 2015-09-22 ENCOUNTER — Telehealth: Payer: Self-pay | Admitting: Obstetrics and Gynecology

## 2015-09-22 LAB — IPS OTHER TISSUE BIOPSY

## 2015-09-22 NOTE — Telephone Encounter (Signed)
I agree with your recommendations. 

## 2015-09-22 NOTE — Telephone Encounter (Signed)
Spoke with patient. Patient was seen for a colposcopy on 09/20/2015 with Dr.Silva. States that she is still experiencing vaginal discharge and a sense of "heaviness" vaginally. Denies any sharp pain, bleeding, fever, or cramping. Advised patient symptoms she is experiencing are normal after having a colposcopy. Advised this typically lasts 2-3 days. Should subside over the weekend. Advised if symptoms persist, worsen, or develops new symptoms to give our office a call. Patient is agreeable.  Dr.Silva anything further for this patient?

## 2015-09-22 NOTE — Telephone Encounter (Signed)
Patient was seen by Dr. Quincy Simmonds yesterday and would like to speak with the nurse.

## 2015-09-26 ENCOUNTER — Ambulatory Visit: Payer: Medicare Other

## 2015-09-26 ENCOUNTER — Telehealth: Payer: Self-pay | Admitting: Obstetrics and Gynecology

## 2015-09-26 NOTE — Telephone Encounter (Signed)
Spoke with patient. Patient states that she is still having vaginal discharge after colpo on 09/20/2015. Feeling a sense of "heaviness" vaginally. Denies any sharp pains, fevers, or chills. Denies any odor to discharge. Advised with continuation of discharge and feeling of heaviness will need to be seen for further evaluation. Patient is agreeable. Appointment scheduled for tomorrow at 2:30 pm with Dr.Silva. Agreeable to date and time.  Routing to provider for final review. Patient agreeable to disposition. Will close encounter.

## 2015-09-26 NOTE — Telephone Encounter (Signed)
Patient had a colposcopy last week and was told to call if she continued to have a discharge. Last seen 09/20/15.

## 2015-09-27 ENCOUNTER — Ambulatory Visit (INDEPENDENT_AMBULATORY_CARE_PROVIDER_SITE_OTHER): Payer: Medicare Other | Admitting: Obstetrics and Gynecology

## 2015-09-27 ENCOUNTER — Encounter: Payer: Self-pay | Admitting: Obstetrics and Gynecology

## 2015-09-27 ENCOUNTER — Ambulatory Visit (INDEPENDENT_AMBULATORY_CARE_PROVIDER_SITE_OTHER): Payer: Medicare Other

## 2015-09-27 VITALS — BP 136/66 | HR 76 | Resp 16 | Ht 65.0 in | Wt 234.0 lb

## 2015-09-27 DIAGNOSIS — N760 Acute vaginitis: Secondary | ICD-10-CM | POA: Diagnosis not present

## 2015-09-27 DIAGNOSIS — E538 Deficiency of other specified B group vitamins: Secondary | ICD-10-CM

## 2015-09-27 MED ORDER — CYANOCOBALAMIN 1000 MCG/ML IJ SOLN
1000.0000 ug | Freq: Once | INTRAMUSCULAR | Status: AC
  Administered 2015-09-27: 1000 ug via INTRAMUSCULAR

## 2015-09-27 MED ORDER — METRONIDAZOLE 500 MG PO TABS: 500.0000 mg | ORAL_TABLET | Freq: Two times a day (BID) | ORAL | Status: DC

## 2015-09-27 NOTE — Progress Notes (Signed)
GYNECOLOGY  VISIT   HPI: 64 y.o.   Divorced  Caucasian  female   No obstetric history on file. with Patient's last menstrual period was 12/30/1998 (approximate).   here for vaginal discharge since 1 week ago.     Feeling some fullness and some discharge. Discharge improved.  Notes some blood when she would void. Some itching.  No odor.  Can have clear discharge.   Generally does not feel well.   Was treated for BV prior to colposcopy with biopsy.  Took Metrogel.   Status post colposcopy with biopsy showing: low grade dysplasia of the cervix.  ECC showed atypia of the exocervix. No endocervical cells were obtained.  No cancer was seen.    No dysuria or pressure to void.  GYNECOLOGIC HISTORY: Patient's last menstrual period was 12/30/1998 (approximate). Contraception: post menopausal  Menopausal hormone therapy: None Last mammogram: 09/23/14 BIRADS1:neg Last pap smear: 08/28/15 Neg. HR HPV:+Detected . Colpo: Low Grade dysplasia and atypia of exocervix.         OB History    No data available         Patient Active Problem List   Diagnosis Date Noted  . BPPV (benign paroxysmal positional vertigo) 06/30/2015  . B12 deficiency anemia   . Other emphysema 02/23/2014  . Necrobiosis lipoidica diabeticorum 12/31/2013  . Essential hypertension, benign 11/05/2013  . Routine health maintenance 09/27/2012  . Vitamin D deficiency 06/20/2011  . OBESITY, CLASS II 03/18/2011  . PULMONARY NODULE 07/12/2010  . Diabetes mellitus type 2, controlled, without complications 03/50/0938  . Hyperlipidemia 10/01/2007  . ANXIETY 10/01/2007  . DEPRESSION 10/01/2007  . SLEEP APNEA 10/01/2007  . ARTHROSCOPY, KNEE, HX OF 10/01/2007    Past Medical History  Diagnosis Date  . Unspecified sleep apnea   . Other and unspecified hyperlipidemia   . Depressive disorder, not elsewhere classified   . Anxiety state, unspecified   . Asthma   . Diabetes mellitus without complication   . Anemia   .  CIN I (cervical intraepithelial neoplasia I)   . B12 deficiency anemia 05/2014 dx  . Internal hemorrhoids   . Arthritis     KNEES  . COPD (chronic obstructive pulmonary disease)   . Emphysema of lung   . Sleep apnea   . Hypertension     Past Surgical History  Procedure Laterality Date  . Hemorrhoid surgery    . Cesarean section    . Knee arthroscopy Left 12/2014  . Colonoscopy    . Upper gastrointestinal endoscopy      Current Outpatient Prescriptions  Medication Sig Dispense Refill  . aspirin 81 MG tablet Take 81 mg by mouth daily.      . cyanocobalamin (,VITAMIN B-12,) 1000 MCG/ML injection Inject 1,000 mcg into the muscle every 30 (thirty) days.    Marland Kitchen escitalopram (LEXAPRO) 10 MG tablet Take 1 tablet (10 mg total) by mouth daily. 90 tablet 3  . ezetimibe-simvastatin (VYTORIN) 10-40 MG per tablet Take 1 tablet by mouth daily. 90 tablet 3  . Folic Acid 20 MG CAPS Take 40 capsules by mouth daily.    . furosemide (LASIX) 20 MG tablet Take 1 tablet (20 mg total) by mouth daily. 90 tablet 3  . irbesartan (AVAPRO) 300 MG tablet Take 1 tablet (300 mg total) by mouth daily. 90 tablet 3  . metFORMIN (GLUCOPHAGE) 1000 MG tablet Take 1 tablet (1,000 mg total) by mouth 2 (two) times daily with a meal. 180 tablet 1  . Multiple Vitamin (MULTIVITAMIN)  capsule Take 1 capsule by mouth daily.    . Omega-3 Fatty Acids (FISH OIL) 1200 MG CAPS Take 2 capsules by mouth daily.      Marland Kitchen ALPRAZolam (NIRAVAM) 0.5 MG dissolvable tablet Take 1 tablet (0.5 mg total) by mouth every 6 (six) hours as needed for anxiety (increased anxiety). (Patient not taking: Reported on 09/27/2015) 20 tablet 0   No current facility-administered medications for this visit.     ALLERGIES: Review of patient's allergies indicates no known allergies.  Family History  Problem Relation Age of Onset  . Heart attack Father 86  . Diabetes Father   . Cancer Father     lung  . Heart disease Father     Social History   Social  History  . Marital Status: Divorced    Spouse Name: N/A  . Number of Children: 3  . Years of Education: 14   Occupational History  . counselor/therapist    Social History Main Topics  . Smoking status: Former Smoker -- 0.50 packs/day for 14 years    Types: Cigarettes  . Smokeless tobacco: Former Systems developer    Quit date: 10/02/2013  . Alcohol Use: 3.0 - 3.6 oz/week    5-6 Standard drinks or equivalent per week  . Drug Use: No  . Sexual Activity: No   Other Topics Concern  . Not on file   Social History Narrative   2 year training [place] in substance abuse counseling. BA- psych from [employer]. Married - 14 years -divorced. 3 daughters - twins 69 - one dtr,Charlotte, died 04/08/2012 unknown causes, 87   Work: Biomedical engineer substance abuse Social worker. No h/o physical or sexual abuse    ROS:  Pertinent items are noted in HPI.  PHYSICAL EXAMINATION:    BP 136/66 mmHg  Pulse 76  Resp 16  Ht 5\' 5"  (1.651 m)  Wt 234 lb (106.142 kg)  BMI 38.94 kg/m2  LMP 12/30/1998 (Approximate)    General appearance: alert, cooperative and appears stated age  Pelvic: External genitalia:  no lesions              Urethra:  normal appearing urethra with no masses, tenderness or lesions              Bartholins and Skenes: normal                 Vagina: normal appearing vagina with beefy inflammation, ulceration right vaginal sidewall.  Friable vaginal mucosa.               Cervix: consistent with biopsy at 12:00.         Bimanual Exam:  Uterus:  normal size, contour, position, consistency, mobility, non-tender              Adnexa: normal adnexa and no mass, fullness, tenderness              Chaperone was present for exam.  ASSESSMENT  Status post colposcopy - LGSIL.  Recent bacterial vaginosis. Treated.  Inflammation of vagina.  Cervix fairly flush with vaginal apex.   PLAN  Affirm.  Will treat with Flagyl 500 mg po bid for 7 days.  Call for fever or increasing pain.  Follow up  in 10 days.  Referring to [place] for second opinion.  Cervix will be a challenge for a LEEP or conization.   An After Visit Summary was printed and given to the patient.  ___15___ minutes face to face time of which over  50% was spent in counseling.

## 2015-09-27 NOTE — Patient Instructions (Signed)
Please take the Flagyl 500 mg by mouth twice a day for one week.  Call if you feel increasing pain or develop fever.

## 2015-09-28 ENCOUNTER — Telehealth: Payer: Self-pay | Admitting: Emergency Medicine

## 2015-09-28 DIAGNOSIS — Z9889 Other specified postprocedural states: Secondary | ICD-10-CM

## 2015-09-28 DIAGNOSIS — IMO0002 Reserved for concepts with insufficient information to code with codable children: Secondary | ICD-10-CM

## 2015-09-28 LAB — WET PREP BY MOLECULAR PROBE
Candida species: NEGATIVE
Gardnerella vaginalis: NEGATIVE
TRICHOMONAS VAG: NEGATIVE

## 2015-09-28 NOTE — Telephone Encounter (Signed)
Return call to Tracy. °

## 2015-09-28 NOTE — Telephone Encounter (Signed)
Patient returned call and she is given message regarding gyn oncology appointment.  She is agreeable to appointment as scheduled and verbalized understanding of instructions.  Will call back with any concerns.  Routing to provider for final review. Patient agreeable to disposition. Will close encounter.

## 2015-09-28 NOTE — Telephone Encounter (Signed)
Message left to return call to [place] at [phone].   Patient scheduled for Gyn Oncology appointment with Dr. Everitt Amber 10/02/15 arrive at 2:00 for 2:30 appointment.  Gynecologic Oncologist at the [facility] located at the [facility].   They would like you to arrive 30 minutes early for registration. Please bring your photo identification card and a copy of your insurance card. Please be aware that the Doctor will complete a pelvic exam on this day as well. : [facility] at [address] [place]. [place], [place] [address]  If this appointment time will not work for you, please let our office know and we can have the appointment changed or you may also call [phone] for their office directly.

## 2015-10-02 ENCOUNTER — Ambulatory Visit: Payer: Medicare Other | Admitting: Gynecologic Oncology

## 2015-10-04 ENCOUNTER — Ambulatory Visit
Admission: RE | Admit: 2015-10-04 | Discharge: 2015-10-04 | Disposition: A | Discharge status: Home / Self Care | Payer: Medicare Other | Source: Ambulatory Visit

## 2015-10-04 DIAGNOSIS — Z1231 Encounter for screening mammogram for malignant neoplasm of breast: Secondary | ICD-10-CM

## 2015-10-05 ENCOUNTER — Telehealth: Payer: Self-pay

## 2015-10-05 NOTE — Telephone Encounter (Signed)
Returning call with a return phone number of: [phone].

## 2015-10-05 NOTE — Telephone Encounter (Signed)
Called patient on Cell [phone] regarding results of Affirm test, "mailbox" full.  Called patient at [phone] and left message on voicemail for patient to call me.

## 2015-10-05 NOTE — Telephone Encounter (Signed)
Patient returned my call.  Notified patient Affirm test negative for infection.  Patient states completed Flagyl today and still having discharge.  It does not have odor, no itching but she is still wearing a pad.  She has appt. With Oncology tomorrow and follow-up with Dr. Quincy Simmonds 10-10-16.  Advised patient will notify Dr. Quincy Simmonds and if she has any further recommendations prior to follow-up appt., I will call her back.  Routed to Dr. Quincy Simmonds.

## 2015-10-05 NOTE — Telephone Encounter (Signed)
-----   Message from Nunzio Cobbs, MD sent at 09/28/2015  6:22 PM EDT ----- Results to patient through My Chart.

## 2015-10-05 NOTE — Telephone Encounter (Signed)
No further recommendations at this time.  Patient will keep appointment with oncology for tomorrow.  Encounter closed.

## 2015-10-06 ENCOUNTER — Ambulatory Visit: Payer: Medicare Other | Attending: Gynecologic Oncology | Admitting: Gynecologic Oncology

## 2015-10-06 NOTE — Telephone Encounter (Signed)
Called patient on cell [phone] and left detailed message stating no further recommendations per Dr. Quincy Simmonds, keep appointment with Oncology today and follow-up appointment with Dr. Quincy Simmonds 10-11-15.

## 2015-10-11 ENCOUNTER — Encounter: Payer: Self-pay | Admitting: Obstetrics and Gynecology

## 2015-10-11 ENCOUNTER — Ambulatory Visit (INDEPENDENT_AMBULATORY_CARE_PROVIDER_SITE_OTHER): Payer: Medicare Other | Admitting: Obstetrics and Gynecology

## 2015-10-11 VITALS — BP 150/78 | HR 76 | Ht 65.0 in | Wt 234.4 lb

## 2015-10-11 DIAGNOSIS — R896 Abnormal cytological findings in specimens from other organs, systems and tissues: Secondary | ICD-10-CM | POA: Diagnosis not present

## 2015-10-11 DIAGNOSIS — IMO0002 Reserved for concepts with insufficient information to code with codable children: Secondary | ICD-10-CM

## 2015-10-11 NOTE — Progress Notes (Signed)
Patient ID: Meredith [name], female   DOB: [birth year], 64 y.o.   MRN: [id number] GYNECOLOGY  VISIT   HPI: 64 y.o.   Divorced  Caucasian  female   No obstetric history on file. with Patient's last menstrual period was 12/30/1998 (approximate).   here for follow up.     Recent bacterial vaginosis on pap treated with Metrogel.  Pap 08/28/15 showed normal cells and positive HR HPV. Status post colposcopy - LGSIL 09/20/15.  Colposcopy unsatisfactory.   Inflammation of vagina noted after procedure on 09/27/15. Was treated with Flagyl 500 mg po bid for 7 days.   Cervix fairly flush with vaginal apex.  Has appointment with Dr. Denman George on 10/16/15 for a second opinion.   Had a cervical cerclage with twin gestation.  Delivered at 38 weeks.   GYNECOLOGIC HISTORY: Patient's last menstrual period was 12/30/1998 (approximate). Contraception: Postmenpausal Menopausal hormone therapy: None Last mammogram: 10-04-15 Density Cat.C/Neg/BiRads1: Last pap smear: 08-28-15 Neg:Pos HR HPV; colposcopy LGSIL and atypia of exocervix.        OB History    No data available         Patient Active Problem List   Diagnosis Date Noted  . BPPV (benign paroxysmal positional vertigo) 06/30/2015  . B12 deficiency anemia   . Other emphysema ([place]) 02/23/2014  . 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[place]) 12/31/2013  . Essential hypertension, benign 11/05/2013  . Routine health maintenance 09/27/2012  . Vitamin D deficiency 06/20/2011  . OBESITY, CLASS II 03/18/2011  . PULMONARY NODULE 07/12/2010  . Diabetes mellitus type 2, controlled, without complications ([place]) 78/29/5621  . Hyperlipidemia 10/01/2007  . ANXIETY 10/01/2007  . DEPRESSION 10/01/2007  . SLEEP APNEA 10/01/2007  . ARTHROSCOPY, KNEE, HX OF 10/01/2007    Past Medical History  Diagnosis Date  . Unspecified sleep apnea   . Other and unspecified hyperlipidemia   . Depressive disorder, not elsewhere classified   . Anxiety state, unspecified   . Asthma   .  Diabetes mellitus without complication   . Anemia   . CIN I (cervical intraepithelial neoplasia I)   . B12 deficiency anemia 05/2014 dx  . Internal hemorrhoids   . Arthritis     KNEES  . COPD (chronic obstructive pulmonary disease)   . Emphysema of lung   . Sleep apnea   . Hypertension     Past Surgical History  Procedure Laterality Date  . Hemorrhoid surgery    . Cesarean section    . Knee arthroscopy Left 12/2014  . Colonoscopy    . Upper gastrointestinal endoscopy      Current Outpatient Prescriptions  Medication Sig Dispense Refill  . ALPRAZolam (NIRAVAM) 0.5 MG dissolvable tablet Take 1 tablet (0.5 mg total) by mouth every 6 (six) hours as needed for anxiety (increased anxiety). (Patient not taking: Reported on 09/27/2015) 20 tablet 0  . aspirin 81 MG tablet Take 81 mg by mouth daily.      . cyanocobalamin (,VITAMIN B-12,) 1000 MCG/ML injection Inject 1,000 mcg into the muscle every 30 (thirty) days.    Marland Kitchen escitalopram (LEXAPRO) 10 MG tablet Take 1 tablet (10 mg total) by mouth daily. 90 tablet 3  . ezetimibe-simvastatin (VYTORIN) 10-40 MG per tablet Take 1 tablet by mouth daily. 90 tablet 3  . Folic Acid 20 MG CAPS Take 40 capsules by mouth daily.    . furosemide (LASIX) 20 MG tablet Take 1 tablet (20 mg total) by mouth daily. 90 tablet 3  . irbesartan (AVAPRO) 300 MG tablet Take  1 tablet (300 mg total) by mouth daily. 90 tablet 3  . metFORMIN (GLUCOPHAGE) 1000 MG tablet Take 1 tablet (1,000 mg total) by mouth 2 (two) times daily with a meal. 180 tablet 1  . metroNIDAZOLE (FLAGYL) 500 MG tablet Take 1 tablet (500 mg total) by mouth 2 (two) times daily. 14 tablet 0  . Multiple Vitamin (MULTIVITAMIN) capsule Take 1 capsule by mouth daily.    . Omega-3 Fatty Acids (FISH OIL) 1200 MG CAPS Take 2 capsules by mouth daily.       No current facility-administered medications for this visit.     ALLERGIES: Review of patient's allergies indicates no known allergies.  Family History   Problem Relation Age of Onset  . Heart attack Father 17  . Diabetes Father   . Cancer Father     lung  . Heart disease Father     Social History   Social History  . Marital Status: Divorced    Spouse Name: N/A  . Number of Children: 3  . Years of Education: 14   Occupational History  . counselor/therapist    Social History Main Topics  . Smoking status: Former Smoker -- 0.50 packs/day for 14 years    Types: Cigarettes  . Smokeless tobacco: Former Systems developer    Quit date: 10/02/2013  . Alcohol Use: 3.0 - 3.6 oz/week    5-6 Standard drinks or equivalent per week  . Drug Use: No  . Sexual Activity: No   Other Topics Concern  . Not on file   Social History Narrative   2 year training [place] in substance abuse counseling. BA- psych from [employer]. Married - 14 years -divorced. 3 daughters - twins 76 - one dtr,Charlotte, died 04/19/2012 unknown causes, 57   Work: Biomedical engineer substance abuse Social worker. No h/o physical or sexual abuse    ROS:  Pertinent items are noted in HPI.  PHYSICAL EXAMINATION:    Ht 5\' 5"  (1.651 m)  Wt 234 lb 6.4 oz (106.323 kg)  BMI 39.01 kg/m2  LMP 12/30/1998 (Approximate)    General appearance: alert, cooperative and appears stated age   Pelvic: External genitalia:  no lesions              Urethra:  normal appearing urethra with no masses, tenderness or lesions              Bartholins and Skenes: normal                 Vagina: normal appearing vagina with normal color and discharge, no lesions              Cervix:  Flush with vagina.  Rim of erythema around cervix.  (history of cervical cerclage.)       Bimanual Exam:  Uterus:  normal size, contour, position, consistency, mobility, non-tender              Adnexa: normal adnexa and no mass, fullness, tenderness             Chaperone was present for exam.  ASSESSMENT  Status post  tx of bacterial vaginosis with Flagyl.  Vaginitis resolved. Unsatisfactory colposcopy with cervical  scarring.  LGSIL.  Positive HR HPV.  Hx cervical cerclage.   PLAN  Counseled regarding above.  Follow up with Dr. Denman George for evaluation and treatment.  Discussed possible recommendations:  Pap and HR HPV testing in 6 - 12 months, LEEP, conization of cervix, hysterectomy with bilateral salpingo-oophorectomy.  Follow up  here prn.   An After Visit Summary was printed and given to the patient.  ___15___ minutes face to face time of which over 50% was spent in counseling.

## 2015-10-16 ENCOUNTER — Ambulatory Visit: Payer: Medicare Other | Attending: Gynecologic Oncology | Admitting: Gynecologic Oncology

## 2015-10-16 ENCOUNTER — Encounter: Payer: Self-pay | Admitting: Gynecologic Oncology

## 2015-10-16 VITALS — BP 126/52 | HR 69 | Temp 98.5°F | Resp 18 | Ht 65.0 in | Wt 234.5 lb

## 2015-10-16 DIAGNOSIS — R896 Abnormal cytological findings in specimens from other organs, systems and tissues: Secondary | ICD-10-CM | POA: Diagnosis not present

## 2015-10-16 DIAGNOSIS — N870 Mild cervical dysplasia: Secondary | ICD-10-CM

## 2015-10-16 DIAGNOSIS — B977 Papillomavirus as the cause of diseases classified elsewhere: Secondary | ICD-10-CM | POA: Diagnosis not present

## 2015-10-16 DIAGNOSIS — IMO0002 Reserved for concepts with insufficient information to code with codable children: Secondary | ICD-10-CM

## 2015-10-16 NOTE — Progress Notes (Signed)
Consult Note: Gyn-Onc  Consult was requested by Dr. Quincy Simmonds for the evaluation of [name] 64 y.o. female with CIN I and positive hrHPV  CC:  Chief Complaint  Patient presents with  . LGSIL (low grade squamous intraepithelial lesion)    New consult    Assessment/Plan:  Ms. Meredith [name]  is a 64 y.o.  year old with low grade cervical dysplasia and hrHPV infection in the setting of a small cervix which is difficult to evaluate. 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  I evaluated the patient's pathology including her pap smear from 08/28/15 and the colposcopy from 09/20/15. While she did not have an endocervical component to her colpo exam, she did have concordance between her biopsy findings and pap findings. Neither warrants an excisional procedure at this time. Therefore I am recommending continued observation with repeat cytology and hpv testing in 12 months. If she has subsequent high grade changes on future paps or colpos, then she will likely require a hysterectomy for diagnostic confirmation due to the fact that her cervix is so small and flush that cone or leep would not be feasible.  I discussed with the patient that hysterectomy would not be curative of this condition because she would have a high chance for persistent vaginal hpv infection and vaginal dysplasia. Therefore, I am recommending hysterectomy only as part of diagnostic or therapeutic workup of a high grade pap/biopsy.   HPI: Meredith [name] is a 64 year old woman who is seen in consultation at the request of Dr Quincy Simmonds for an abnormal pap smear and cervical dysplasia.  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  She had issues with cervical incompetancy in pregnancies and required a cerclage.  She had a lifetime of normal pap testing until 2 years ago when she developed LGSIL.  This year she had a pap on 08/28/15 which showed normal cytology but was positive for hrHPV subtypes. This prompted a  colposcopy on 09/20/15 with biopsies revealing LGSIL and scant ectocervical tissue (no endocervical tissue) on the ECC. The pathology report concedes concordance between the pap and biopsies. Of note, the patient had an inflamed cervix noted at that exam. She also had findings of a cervix that was flush with the top of the vagina.    Current Meds:  Outpatient Encounter Prescriptions as of 10/16/2015  Medication Sig  . ALPRAZolam (NIRAVAM) 0.5 MG dissolvable tablet Take 1 tablet (0.5 mg total) by mouth every 6 (six) hours as needed for anxiety (increased anxiety).  Marland Kitchen aspirin 81 MG tablet Take 81 mg by mouth daily.    . cyanocobalamin (,VITAMIN B-12,) 1000 MCG/ML injection Inject 1,000 mcg into the muscle every 30 (thirty) days.  Marland Kitchen escitalopram (LEXAPRO) 10 MG tablet Take 1 tablet (10 mg total) by mouth daily.  Marland Kitchen ezetimibe-simvastatin (VYTORIN) 10-40 MG per tablet Take 1 tablet by mouth daily.  . folic acid (FOLVITE) 660 MCG tablet Take 400 mcg by mouth daily.  . furosemide (LASIX) 20 MG tablet Take 1 tablet (20 mg total) by mouth daily.  . irbesartan (AVAPRO) 300 MG tablet Take 1 tablet (300 mg total) by mouth daily.  . metFORMIN (GLUCOPHAGE) 1000 MG tablet Take 1 tablet (1,000 mg total) by mouth 2 (two) times daily with a meal.  . Multiple Vitamin (MULTIVITAMIN) capsule Take 1 capsule by mouth daily.  . Omega-3 Fatty Acids (FISH OIL) 1200 MG CAPS Take 2 capsules by mouth daily.  No facility-administered encounter medications on file as of 10/16/2015.    Allergy: No Known Allergies  Social Hx:   Social History   Social History  . Marital Status: Divorced    Spouse Name: N/A  . Number of Children: 3  . Years of Education: 14   Occupational History  . counselor/therapist    Social History Main Topics  . Smoking status: Former Smoker -- 0.50 packs/day for 14 years    Types: Cigarettes  . Smokeless tobacco: Former Systems developer    Quit date: 10/02/2013  . Alcohol Use: 3.0 - 3.6 oz/week     5-6 Standard drinks or equivalent per week  . Drug Use: No  . Sexual Activity: No   Other Topics Concern  . 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 BA- psych from [employer]. Married - 14 years -divorced. 3 daughters - twins 26 - one dtr,Charlotte, died 2012/04/06 unknown causes, 2   Work: Biomedical engineer substance abuse Social worker. No h/o physical or sexual abuse    Past Surgical Hx:  Past Surgical History  Procedure Laterality Date  . Hemorrhoid surgery    . Cesarean section    . Knee arthroscopy Left 12/2014  . Colonoscopy    . Upper gastrointestinal endoscopy      Past Medical Hx:  Past Medical History  Diagnosis Date  . Unspecified sleep apnea   . Other and unspecified hyperlipidemia   . Depressive disorder, not elsewhere classified   . Anxiety state, unspecified   . Asthma   . Diabetes mellitus without complication ([place])   . Anemia   . CIN I (cervical intraepithelial neoplasia I)   . B12 deficiency anemia 05/2014 dx  . Internal hemorrhoids   . Arthritis     KNEES  . COPD (chronic obstructive pulmonary disease) ([place])   . Emphysema of lung ([place])   . Sleep apnea   . Hypertension     Past Gynecological History:   Patient's last menstrual period was 12/30/1998 (approximate).  Family Hx:  Family History  Problem Relation Age of Onset  . Heart attack Father 16  . Diabetes Father   . Cancer Father     lung  . Heart disease Father     Review of Systems:  Constitutional  Feels well,    ENT Normal appearing ears and nares bilaterally Skin/Breast  No rash, sores, jaundice, itching, dryness Cardiovascular  No chest pain, shortness of breath, or edema  Pulmonary  No cough or wheeze.  Gastro Intestinal  No nausea, vomitting, or diarrhoea. No bright red blood per rectum, no abdominal pain, change in bowel movement, or constipation.  Genito Urinary  No frequency, urgency, dysuria, + abnormal  vaginal d/c treated with flagyl Musculo Skeletal  No myalgia, arthralgia, joint swelling or pain  Neurologic  No weakness, numbness, change in gait,  Psychology  No depression, anxiety, insomnia.   Vitals:  Blood pressure 126/52, pulse 69, temperature 98.5 F (36.9 C), temperature source Oral, resp. rate 18, height 5\' 5"  (1.651 m), weight 234 lb 8 oz (106.369 kg), last menstrual period 12/30/1998, SpO2 98 %.  Physical Exam: WD in NAD Neck  Supple NROM, without any enlargements.  Lymph Node Survey No cervical supraclavicular or inguinal adenopathy Cardiovascular  Pulse normal rate, regularity and rhythm. S1 and S2 normal.  Lungs  Clear to auscultation bilateraly, without wheezes/crackles/rhonchi. Good air movement.  Skin  No rash/lesions/breakdown  Psychiatry  Alert and  oriented to person, place, and time  Abdomen  Normoactive bowel sounds, abdomen soft, non-tender and obese without evidence of hernia.  Back No CVA tenderness Genito Urinary  Vulva/vagina: Normal external female genitalia.  No lesions. No discharge or bleeding.  Bladder/urethra:  No lesions or masses, well supported bladder  Vagina: normal  Cervix: very small, flush with vagina, os visible, no gross cervical abnormality seen and palpably normal  Uterus:  Small, mobile, no parametrial involvement or nodularity.  Adnexa: no palpable masses. Rectal  Good tone, no masses no cul de sac nodularity.  Extremities  No bilateral cyanosis, clubbing or edema.   Donaciano Eva, MD  10/16/2015, 5:05 PM

## 2015-10-16 NOTE — Patient Instructions (Signed)
Plan to follow up with Dr. Quincy Simmonds in one year for a repeat pap at that time.  Based on the results, you may need to have a colposcopy with biopsies.  Please call for any questions or concerns.

## 2015-10-17 NOTE — Progress Notes (Signed)
Please place patient in pap recall - 08.  Had GYN ONC consultation and does not need anything further at this time.

## 2015-11-01 DIAGNOSIS — M1712 Unilateral primary osteoarthritis, left knee: Secondary | ICD-10-CM | POA: Diagnosis not present

## 2015-11-01 DIAGNOSIS — M9901 Segmental and somatic dysfunction of cervical region: Secondary | ICD-10-CM | POA: Diagnosis not present

## 2015-11-01 DIAGNOSIS — M4003 Postural kyphosis, cervicothoracic region: Secondary | ICD-10-CM | POA: Diagnosis not present

## 2015-11-01 DIAGNOSIS — M50322 Other cervical disc degeneration at C5-C6 level: Secondary | ICD-10-CM | POA: Diagnosis not present

## 2015-11-01 DIAGNOSIS — M9902 Segmental and somatic dysfunction of thoracic region: Secondary | ICD-10-CM | POA: Diagnosis not present

## 2015-11-01 DIAGNOSIS — M25562 Pain in left knee: Secondary | ICD-10-CM | POA: Diagnosis not present

## 2015-11-03 NOTE — Progress Notes (Signed)
08 pap recall entered

## 2015-11-16 ENCOUNTER — Ambulatory Visit (INDEPENDENT_AMBULATORY_CARE_PROVIDER_SITE_OTHER): Payer: Medicare Other

## 2015-11-16 DIAGNOSIS — H52221 Regular astigmatism, right eye: Secondary | ICD-10-CM | POA: Diagnosis not present

## 2015-11-16 DIAGNOSIS — H5203 Hypermetropia, bilateral: Secondary | ICD-10-CM | POA: Diagnosis not present

## 2015-11-16 DIAGNOSIS — Z23 Encounter for immunization: Secondary | ICD-10-CM | POA: Diagnosis not present

## 2015-11-16 DIAGNOSIS — H35371 Puckering of macula, right eye: Secondary | ICD-10-CM | POA: Diagnosis not present

## 2015-11-16 DIAGNOSIS — H524 Presbyopia: Secondary | ICD-10-CM | POA: Diagnosis not present

## 2015-11-16 DIAGNOSIS — H2513 Age-related nuclear cataract, bilateral: Secondary | ICD-10-CM | POA: Diagnosis not present

## 2015-11-17 ENCOUNTER — Encounter: Payer: Self-pay | Admitting: Internal Medicine

## 2015-11-18 ENCOUNTER — Other Ambulatory Visit: Payer: Self-pay | Admitting: Internal Medicine

## 2015-11-20 MED ORDER — SIMVASTATIN 40 MG PO TABS: 40.0000 mg | ORAL_TABLET | Freq: Every day | ORAL | Status: DC

## 2015-11-20 MED ORDER — EZETIMIBE 10 MG PO TABS: 10.0000 mg | ORAL_TABLET | Freq: Every day | ORAL | Status: DC

## 2015-11-21 ENCOUNTER — Other Ambulatory Visit: Payer: Self-pay | Admitting: Internal Medicine

## 2015-12-01 DIAGNOSIS — H02831 Dermatochalasis of right upper eyelid: Secondary | ICD-10-CM | POA: Diagnosis not present

## 2015-12-06 DIAGNOSIS — H18832 Recurrent erosion of cornea, left eye: Secondary | ICD-10-CM | POA: Diagnosis not present

## 2015-12-07 DIAGNOSIS — S0502XD Injury of conjunctiva and corneal abrasion without foreign body, left eye, subsequent encounter: Secondary | ICD-10-CM | POA: Diagnosis not present

## 2015-12-08 DIAGNOSIS — S0502XD Injury of conjunctiva and corneal abrasion without foreign body, left eye, subsequent encounter: Secondary | ICD-10-CM | POA: Diagnosis not present

## 2015-12-15 ENCOUNTER — Encounter: Payer: Self-pay | Admitting: Internal Medicine

## 2015-12-15 ENCOUNTER — Other Ambulatory Visit (INDEPENDENT_AMBULATORY_CARE_PROVIDER_SITE_OTHER): Payer: Medicare Other

## 2015-12-15 ENCOUNTER — Ambulatory Visit (INDEPENDENT_AMBULATORY_CARE_PROVIDER_SITE_OTHER): Payer: Medicare Other | Admitting: Internal Medicine

## 2015-12-15 VITALS — BP 132/78 | HR 69 | Temp 98.3°F | Resp 14 | Ht 65.0 in | Wt 238.1 lb

## 2015-12-15 DIAGNOSIS — E538 Deficiency of other specified B group vitamins: Secondary | ICD-10-CM

## 2015-12-15 DIAGNOSIS — I10 Essential (primary) hypertension: Secondary | ICD-10-CM

## 2015-12-15 DIAGNOSIS — D519 Vitamin B12 deficiency anemia, unspecified: Secondary | ICD-10-CM | POA: Diagnosis not present

## 2015-12-15 DIAGNOSIS — E669 Obesity, unspecified: Secondary | ICD-10-CM

## 2015-12-15 DIAGNOSIS — E119 Type 2 diabetes mellitus without complications: Secondary | ICD-10-CM

## 2015-12-15 DIAGNOSIS — E785 Hyperlipidemia, unspecified: Secondary | ICD-10-CM | POA: Diagnosis not present

## 2015-12-15 DIAGNOSIS — E1169 Type 2 diabetes mellitus with other specified complication: Secondary | ICD-10-CM

## 2015-12-15 LAB — VITAMIN B12: VITAMIN B 12: 405 pg/mL (ref 211–911)

## 2015-12-15 LAB — HEMOGLOBIN A1C: Hgb A1c MFr Bld: 7.4 % — ABNORMAL HIGH (ref 4.6–6.5)

## 2015-12-15 MED ORDER — EZETIMIBE 10 MG PO TABS: 10.0000 mg | ORAL_TABLET | Freq: Every day | ORAL | Status: DC

## 2015-12-15 NOTE — Progress Notes (Signed)
Pre visit review using our clinic review tool, if applicable. No additional management support is needed unless otherwise documented below in the visit note. 

## 2015-12-16 NOTE — Assessment & Plan Note (Signed)
Checking B12 level as not checked in some time. Still doing monthly injections.

## 2015-12-16 NOTE — Assessment & Plan Note (Signed)
Taking metformin and irbesartan. No complications known. Checking HgA1c today. Adjust as needed.

## 2015-12-16 NOTE — Assessment & Plan Note (Signed)
Previously not controlled on atorvastatin and has done well on vytorin. Have switched her to simvastatin and zetia as the same therapy but more reasonable cost.

## 2015-12-16 NOTE — Assessment & Plan Note (Signed)
Controlled on irbesartan and lasix. Last BMP reviewed and no indication for change. BP at goal today.

## 2015-12-16 NOTE — Progress Notes (Signed)
   Subjective:    Patient ID: Meredith [name], female    DOB: [birth year], 64 y.o.   MRN: [id number]  HPI The patient is a 64 YO female coming in for follow up of her diabetes. She is still taking the metformin and no side effects. No low sugars. Not exercising lately and her diet is not as good so she has gained back some of the weight she lost earlier in the year. Denies numbness, headaches, chest pains, SOB.   Review of Systems  Constitutional: Negative for fever, activity change, appetite change, fatigue and unexpected weight change.  Respiratory: Negative for cough, chest tightness, shortness of breath and wheezing.   Cardiovascular: Negative for chest pain, palpitations and leg swelling.  Gastrointestinal: Negative for nausea, diarrhea, constipation and abdominal distention.  Musculoskeletal: Negative.   Neurological: Positive for dizziness. Negative for tremors, seizures, syncope, facial asymmetry, weakness, light-headedness, numbness and headaches.       Mild  Psychiatric/Behavioral: Negative.       Objective:   Physical Exam  Constitutional: She is oriented to person, place, and time. She appears well-developed and well-nourished.  HENT:  Head: Normocephalic and atraumatic.  Eyes: EOM are normal.  Neck: Normal range of motion.  Cardiovascular: Normal rate and regular rhythm.   Pulmonary/Chest: Effort normal and breath sounds normal.  Abdominal: Soft. Bowel sounds are normal. She exhibits no distension.  Musculoskeletal: She exhibits no edema.  Neurological: She is alert and oriented to person, place, and time. Coordination normal.  Skin: Skin is warm and dry.  Psychiatric: She has a normal mood and affect.    Filed Vitals:   12/15/15 1629  BP: 132/78  Pulse: 69  Temp: 98.3 F (36.8 C)  TempSrc: Oral  Resp: 14  Height: 5\' 5"  (1.651 m)  Weight: 238 lb 1.9 oz (108.011 kg)  SpO2: 98%      Assessment & Plan:

## 2015-12-19 ENCOUNTER — Ambulatory Visit (INDEPENDENT_AMBULATORY_CARE_PROVIDER_SITE_OTHER): Payer: Medicare Other

## 2015-12-19 DIAGNOSIS — Z205 Contact with and (suspected) exposure to viral hepatitis: Secondary | ICD-10-CM

## 2015-12-19 DIAGNOSIS — Z202 Contact with and (suspected) exposure to infections with a predominantly sexual mode of transmission: Secondary | ICD-10-CM

## 2015-12-19 DIAGNOSIS — Z1159 Encounter for screening for other viral diseases: Secondary | ICD-10-CM

## 2015-12-19 DIAGNOSIS — Z23 Encounter for immunization: Secondary | ICD-10-CM | POA: Diagnosis not present

## 2015-12-19 DIAGNOSIS — E538 Deficiency of other specified B group vitamins: Secondary | ICD-10-CM

## 2015-12-19 MED ORDER — CYANOCOBALAMIN 1000 MCG/ML IJ SOLN
1000.0000 ug | Freq: Once | INTRAMUSCULAR | Status: AC
  Administered 2015-12-19: 1000 ug via INTRAMUSCULAR

## 2015-12-19 NOTE — Addendum Note (Signed)
Addended by: Della Goo C on: 12/19/2015 03:05 PM   Modules accepted: Orders

## 2015-12-28 ENCOUNTER — Encounter: Payer: Self-pay | Admitting: Internal Medicine

## 2015-12-29 DIAGNOSIS — H1851 Endothelial corneal dystrophy: Secondary | ICD-10-CM | POA: Diagnosis not present

## 2015-12-29 DIAGNOSIS — H1859 Other hereditary corneal dystrophies: Secondary | ICD-10-CM | POA: Diagnosis not present

## 2015-12-29 DIAGNOSIS — H1850 Unspecified hereditary corneal dystrophies: Secondary | ICD-10-CM | POA: Diagnosis not present

## 2016-01-12 DIAGNOSIS — H52223 Regular astigmatism, bilateral: Secondary | ICD-10-CM | POA: Diagnosis not present

## 2016-01-12 DIAGNOSIS — H524 Presbyopia: Secondary | ICD-10-CM | POA: Diagnosis not present

## 2016-01-12 DIAGNOSIS — H1859 Other hereditary corneal dystrophies: Secondary | ICD-10-CM | POA: Diagnosis not present

## 2016-01-12 DIAGNOSIS — H5203 Hypermetropia, bilateral: Secondary | ICD-10-CM | POA: Diagnosis not present

## 2016-01-19 ENCOUNTER — Ambulatory Visit (INDEPENDENT_AMBULATORY_CARE_PROVIDER_SITE_OTHER): Payer: Medicare Other

## 2016-01-19 DIAGNOSIS — E538 Deficiency of other specified B group vitamins: Secondary | ICD-10-CM

## 2016-01-19 MED ORDER — CYANOCOBALAMIN 1000 MCG/ML IJ SOLN
1000.0000 ug | Freq: Once | INTRAMUSCULAR | Status: AC
  Administered 2016-01-19: 1000 ug via INTRAMUSCULAR

## 2016-01-30 NOTE — Progress Notes (Signed)
  This encounter was created in error - please disregard.  Patient had checked in but could not wait to see the physician so she left.

## 2016-02-05 DIAGNOSIS — L649 Androgenic alopecia, unspecified: Secondary | ICD-10-CM | POA: Diagnosis not present

## 2016-02-25 ENCOUNTER — Other Ambulatory Visit: Payer: Self-pay | Admitting: Internal Medicine

## 2016-02-27 ENCOUNTER — Other Ambulatory Visit (INDEPENDENT_AMBULATORY_CARE_PROVIDER_SITE_OTHER): Payer: Medicare Other

## 2016-02-27 ENCOUNTER — Other Ambulatory Visit: Payer: Medicare Other

## 2016-02-27 ENCOUNTER — Ambulatory Visit (INDEPENDENT_AMBULATORY_CARE_PROVIDER_SITE_OTHER): Payer: Medicare Other | Admitting: Internal Medicine

## 2016-02-27 ENCOUNTER — Encounter: Payer: Self-pay | Admitting: Internal Medicine

## 2016-02-27 ENCOUNTER — Ambulatory Visit (INDEPENDENT_AMBULATORY_CARE_PROVIDER_SITE_OTHER)
Admission: RE | Admit: 2016-02-27 | Discharge: 2016-02-27 | Disposition: A | Discharge status: Home / Self Care | Payer: Medicare Other | Source: Ambulatory Visit | Attending: Internal Medicine | Admitting: Internal Medicine

## 2016-02-27 VITALS — BP 114/78 | HR 75 | Temp 98.1°F | Resp 16 | Ht 65.5 in | Wt 235.0 lb

## 2016-02-27 DIAGNOSIS — E559 Vitamin D deficiency, unspecified: Secondary | ICD-10-CM

## 2016-02-27 DIAGNOSIS — D519 Vitamin B12 deficiency anemia, unspecified: Secondary | ICD-10-CM

## 2016-02-27 DIAGNOSIS — E119 Type 2 diabetes mellitus without complications: Secondary | ICD-10-CM | POA: Diagnosis not present

## 2016-02-27 DIAGNOSIS — Z0000 Encounter for general adult medical examination without abnormal findings: Secondary | ICD-10-CM

## 2016-02-27 DIAGNOSIS — I10 Essential (primary) hypertension: Secondary | ICD-10-CM

## 2016-02-27 DIAGNOSIS — E785 Hyperlipidemia, unspecified: Secondary | ICD-10-CM

## 2016-02-27 DIAGNOSIS — Z780 Asymptomatic menopausal state: Secondary | ICD-10-CM

## 2016-02-27 LAB — FERRITIN: Ferritin: 14.3 ng/mL (ref 10.0–291.0)

## 2016-02-27 LAB — HEPATITIS C ANTIBODY: HCV AB: NEGATIVE

## 2016-02-27 LAB — LIPID PANEL
CHOLESTEROL: 171 mg/dL (ref 0–200)
HDL: 41.6 mg/dL (ref >39.00)
LDL Cholesterol: 95 mg/dL (ref 0–99)
NonHDL: 129.82
Total CHOL/HDL Ratio: 4
Triglycerides: 176 mg/dL — ABNORMAL HIGH (ref 0.0–149.0)
VLDL: 35.2 mg/dL (ref 0.0–40.0)

## 2016-02-27 LAB — CBC
HCT: 34.3 % — ABNORMAL LOW (ref 36.0–46.0)
Hemoglobin: 11.3 g/dL — ABNORMAL LOW (ref 12.0–15.0)
MCHC: 33.1 g/dL (ref 30.0–36.0)
MCV: 84.2 fl (ref 78.0–100.0)
PLATELETS: 267 10*3/uL (ref 150.0–400.0)
RBC: 4.07 Mil/uL (ref 3.87–5.11)
RDW: 15.1 % (ref 11.5–15.5)
WBC: 5.3 10*3/uL (ref 4.0–10.5)

## 2016-02-27 LAB — HEMOGLOBIN A1C: Hgb A1c MFr Bld: 7.7 % — ABNORMAL HIGH (ref 4.6–6.5)

## 2016-02-27 MED ORDER — CYANOCOBALAMIN 1000 MCG/ML IJ SOLN
1000.0000 ug | Freq: Once | INTRAMUSCULAR | Status: AC
  Administered 2016-02-27: 1000 ug via INTRAMUSCULAR

## 2016-02-27 NOTE — Assessment & Plan Note (Signed)
Last HgA1c 7.4 which is acceptable control. Taking metformin 1000 mg BID and ARB and statin. No complications. Pneumonia up to date. Checking labs today and adjust as needed.

## 2016-02-27 NOTE — Patient Instructions (Signed)
We are checking the cholesterol, the iron, the sugars, and the hepatitis C screening today.   We have given you the B12 shot today.   Come back in about 6 months if everything is going well.   Diabetes and Exercise Exercising regularly is important. It is not just about losing weight. It has many health benefits, such as:  Improving your overall fitness, flexibility, and endurance.  Increasing your bone density.  Helping with weight control.  Decreasing your body fat.  Increasing your muscle strength.  Reducing stress and tension.  Improving your overall health. People with diabetes who exercise gain additional benefits because exercise:  Reduces appetite.  Improves the body's use of blood sugar (glucose).  Helps lower or control blood glucose.  Decreases blood pressure.  Helps control blood lipids (such as cholesterol and triglycerides).  Improves the body's use of the hormone insulin by:  Increasing the body's insulin sensitivity.  Reducing the body's insulin needs.  Decreases the risk for heart disease because exercising:  Lowers cholesterol and triglycerides levels.  Increases the levels of good cholesterol (such as high-density lipoproteins [HDL]) in the body.  Lowers blood glucose levels. YOUR ACTIVITY PLAN  Choose an activity that you enjoy, and set realistic goals. To exercise safely, you should begin practicing any new physical activity slowly, and gradually increase the intensity of the exercise over time. Your health care provider or diabetes educator can help create an activity plan that works for you. General recommendations include:  Encouraging children to engage in at least 60 minutes of physical activity each day.  Stretching and performing strength training exercises, such as yoga or weight lifting, at least 2 times per week.  Performing a total of at least 150 minutes of moderate-intensity exercise each week, such as brisk walking or water  aerobics.  Exercising at least 3 days per week, making sure you allow no more than 2 consecutive days to pass without exercising.  Avoiding long periods of inactivity (90 minutes or more). When you have to spend an extended period of time sitting down, take frequent breaks to walk or stretch. RECOMMENDATIONS FOR EXERCISING WITH TYPE 1 OR TYPE 2 DIABETES   Check your blood glucose before exercising. If blood glucose levels are greater than 240 mg/dL, check for urine ketones. Do not exercise if ketones are present.  Avoid injecting insulin into areas of the body that are going to be exercised. For example, avoid injecting insulin into:  The arms when playing tennis.  The legs when jogging.  Keep a record of:  Food intake before and after you exercise.  Expected peak times of insulin action.  Blood glucose levels before and after you exercise.  The type and amount of exercise you have done.  Review your records with your health care provider. Your health care provider will help you to develop guidelines for adjusting food intake and insulin amounts before and after exercising.  If you take insulin or oral hypoglycemic agents, watch for signs and symptoms of hypoglycemia. They include:  Dizziness.  Shaking.  Sweating.  Chills.  Confusion.  Drink plenty of water while you exercise to prevent dehydration or heat stroke. Body water is lost during exercise and must be replaced.  Talk to your health care provider before starting an exercise program to make sure it is safe for you. Remember, almost any type of activity is better than none.   This information is not intended to replace advice given to you by your health care  provider. Make sure you discuss any questions you have with your health care provider.   Document Released: 03/07/2004 Document Revised: 05/02/2015 Document Reviewed: 05/25/2013 Elsevier Interactive Patient Education [employer].

## 2016-02-27 NOTE — Progress Notes (Signed)
Pre visit review using our clinic review tool, if applicable. No additional management support is needed unless otherwise documented below in the visit note. 

## 2016-02-27 NOTE — Progress Notes (Signed)
   Subjective:    Patient ID: Meredith [name], female    DOB: [birth year], 65 y.o.   MRN: [id number]  HPI The patient is a 65 YO female coming in for follow up of her diabetes (doing well on metformin and avapro, no complications), cholesterol (recent change from vytorin to simvastatin due to cost, no new problems or side effects, control unknown) and her blood pressure (taking avapro and well controlled, no side effects). New concern of hair loss, saw dermatology who checked some blood work and recommended iron supplement as her levels were low.   Review of Systems  Constitutional: Negative for fever, activity change, appetite change, fatigue and unexpected weight change.  HENT: Negative.   Eyes: Negative.   Respiratory: Negative for cough, chest tightness, shortness of breath and wheezing.   Cardiovascular: Negative for chest pain, palpitations and leg swelling.  Gastrointestinal: Negative for nausea, diarrhea, constipation and abdominal distention.  Musculoskeletal: Negative.   Skin: Negative.   Neurological: Negative for dizziness, tremors, seizures, syncope, facial asymmetry, weakness, light-headedness, numbness and headaches.  Psychiatric/Behavioral: Negative.       Objective:   Physical Exam  Constitutional: She is oriented to person, place, and time. She appears well-developed and well-nourished.  HENT:  Head: Normocephalic and atraumatic.  Eyes: EOM are normal.  Neck: Normal range of motion.  Cardiovascular: Normal rate and regular rhythm.   Pulmonary/Chest: Effort normal and breath sounds normal. No respiratory distress. She has no wheezes. She has no rales.  Abdominal: Soft. Bowel sounds are normal. She exhibits no distension.  Musculoskeletal: She exhibits no edema.  Neurological: She is alert and oriented to person, place, and time. Coordination normal.  Skin: Skin is warm and dry.  Psychiatric: She has a normal mood and affect.   Filed Vitals:   02/27/16 0856  BP: 114/78    Pulse: 75  Temp: 98.1 F (36.7 C)  TempSrc: Oral  Resp: 16  Height: 5' 5.5" (1.664 m)  Weight: 235 lb (106.595 kg)  SpO2: 97%      Assessment & Plan:  Vitamin B12 shot given at visit.

## 2016-03-01 ENCOUNTER — Telehealth: Payer: Self-pay | Admitting: Internal Medicine

## 2016-03-01 NOTE — Assessment & Plan Note (Signed)
Checking lipid panel as she is just on simvastatin now instead of vytorin. Adjust as needed.

## 2016-03-01 NOTE — Telephone Encounter (Signed)
Please follow up with patient on labs.

## 2016-03-01 NOTE — Telephone Encounter (Signed)
Pt call back request to speak to the assistant concern about lab result. Please call her back today if possible.

## 2016-03-01 NOTE — Assessment & Plan Note (Signed)
B12 shot given at visit.

## 2016-03-01 NOTE — Telephone Encounter (Signed)
Informed pt of results. Pt is concerned about her triglycerides and would like to know what she can do additionally to improve that number.

## 2016-03-01 NOTE — Assessment & Plan Note (Signed)
BP at goal on lasix and avapro. Checking CMP and adjust as needed.

## 2016-03-01 NOTE — Telephone Encounter (Signed)
This number is not that high and would not recommend medication. This is based on the diet and trying a more mediterranean diet might help.

## 2016-03-02 NOTE — Telephone Encounter (Signed)
Pt informed and given MD response below.   Pt wanted to know if she should fill the rx for vytorin (even though insurance doesn't pay for it). Pt doesn't feel that the simvastatin is working since her numbers are above the "norm".

## 2016-03-04 NOTE — Telephone Encounter (Signed)
I do not think that she needs the vytorin. Her numbers do not warrant additional therapy.

## 2016-03-05 NOTE — Telephone Encounter (Signed)
Left message for patient to call back to follow up on the message below regarding the vytorin. I want to make sure she got the message that she does not need to be taking that, per Dr. Sharlet Salina.

## 2016-03-15 ENCOUNTER — Ambulatory Visit (INDEPENDENT_AMBULATORY_CARE_PROVIDER_SITE_OTHER): Payer: Medicare Other | Admitting: Physician Assistant

## 2016-03-15 VITALS — BP 132/70 | HR 96 | Temp 98.4°F | Resp 18 | Ht 65.0 in | Wt 234.0 lb

## 2016-03-15 DIAGNOSIS — J111 Influenza due to unidentified influenza virus with other respiratory manifestations: Secondary | ICD-10-CM

## 2016-03-15 DIAGNOSIS — R69 Illness, unspecified: Principal | ICD-10-CM

## 2016-03-15 LAB — POCT CBC
Granulocyte percent: 59.7 %G (ref 37–80)
HCT, POC: 34.4 % — AB (ref 37.7–47.9)
Hemoglobin: 11.9 g/dL — AB (ref 12.2–16.2)
Lymph, poc: 2.7 (ref 0.6–3.4)
MCH, POC: 29 pg (ref 27–31.2)
MCHC: 34.7 g/dL (ref 31.8–35.4)
MCV: 83.5 fL (ref 80–97)
MID (CBC): 0.7 (ref 0–0.9)
MPV: 7.6 fL (ref 0–99.8)
PLATELET COUNT, POC: 247 10*3/uL (ref 142–424)
POC Granulocyte: 5 (ref 2–6.9)
POC LYMPH %: 32.4 % (ref 10–50)
POC MID %: 7.9 % (ref 0–12)
RBC: 4.12 M/uL (ref 4.04–5.48)
RDW, POC: 15.6 %
WBC: 8.4 10*3/uL (ref 4.6–10.2)

## 2016-03-15 NOTE — Patient Instructions (Signed)
Take 400 mg of Ibuprofen every 8 hours.  Take Tylenol 650 every 8 hours. Take one Zyrtec in the morning daily.  If you develop cough call me so I can get you some cough syrup.

## 2016-03-15 NOTE — Progress Notes (Signed)
03/15/2016 7:53 PM   DOB: Aug 16, 1951 / MRN: [id number]  SUBJECTIVE:  Meredith [name] is a 65 y.o. female presenting for sore throat and body aches that started yesterday. She has had the flu shot.  She denies fever, chills, nausea.  Pushing PO fluids.  Has not had much appetite.      She has No Known Allergies.   She  has a past medical history of Unspecified sleep apnea; Other and unspecified hyperlipidemia; Depressive disorder, not elsewhere classified; Anxiety state, unspecified; Asthma; Diabetes mellitus without complication ([place]); Anemia; CIN I (cervical intraepithelial neoplasia I); B12 deficiency anemia (05/2014 dx); Internal hemorrhoids; Arthritis; COPD (chronic obstructive pulmonary disease) ([place]); Emphysema of lung ([place]); Sleep apnea; and Hypertension.    She  reports that she has quit smoking. Her smoking use included Cigarettes. She has a 7 pack-year smoking history. She quit smokeless tobacco use about 2 years ago. She reports that she drinks about 3.0 - 3.6 oz of alcohol per week. She reports that she does not use illicit drugs. She  reports that she does not engage in sexual activity. The patient  has past surgical history that includes Hemorrhoid surgery; Cesarean section; Knee arthroscopy (Left, 12/2014); Colonoscopy; and Upper gastrointestinal endoscopy.  Her family history includes Cancer in her father; Diabetes in her father; Heart attack (age of onset: 63) in her father; Heart disease in her father.  Review of Systems  Constitutional: Positive for malaise/fatigue. Negative for fever, chills and diaphoresis.  HENT: Positive for congestion and sore throat.   Respiratory: Positive for cough. Negative for hemoptysis, shortness of breath and wheezing.   Cardiovascular: Negative for chest pain.  Gastrointestinal: Negative for nausea.  Skin: Negative for rash.  Neurological: Negative for dizziness and weakness.  Endo/Heme/Allergies: Negative for polydipsia.    Problem list and  medications reviewed and updated by myself where necessary, and exist elsewhere in the encounter.   OBJECTIVE:  BP 132/70 mmHg  Pulse 96  Temp(Src) 98.4 F (36.9 C)  Resp 18  Ht 5\' 5"  (1.651 m)  Wt 234 lb (106.142 kg)  BMI 38.94 kg/m2  SpO2 96%  LMP 12/30/1998 (Approximate)  Physical Exam  Constitutional: She is oriented to person, place, and time. She appears well-nourished. No distress.  Eyes: EOM are normal. Pupils are equal, round, and reactive to light.  Cardiovascular: Normal rate.   Pulmonary/Chest: Effort normal. She has no wheezes. She has no rales.  Abdominal: She exhibits no distension.  Neurological: She is alert and oriented to person, place, and time. No cranial nerve deficit. Gait normal.  Skin: Skin is dry. She is not diaphoretic.  Psychiatric: She has a normal mood and affect.  Vitals reviewed.   Results for orders placed or performed in visit on 03/15/16 (from the past 72 hour(s))  POCT CBC     Status: Abnormal   Collection Time: 03/15/16  7:42 PM  Result Value Ref Range   WBC 8.4 4.6 - 10.2 K/uL   Lymph, poc 2.7 0.6 - 3.4   POC LYMPH PERCENT 32.4 10 - 50 %L   MID (cbc) 0.7 0 - 0.9   POC MID % 7.9 0 - 12 %M   POC Granulocyte 5.0 2 - 6.9   Granulocyte percent 59.7 37 - 80 %G   RBC 4.12 4.04 - 5.48 M/uL   Hemoglobin 11.9 (A) 12.2 - 16.2 g/dL   HCT, POC 34.4 (A) 37.7 - 47.9 %   MCV 83.5 80 - 97 fL   MCH, POC  29.0 27 - 31.2 pg   MCHC 34.7 31.8 - 35.4 g/dL   RDW, POC 15.6 %   Platelet Count, POC 247 142 - 424 K/uL   MPV 7.6 0 - 99.8 fL    No results found.  ASSESSMENT AND PLAN  [place] was seen today for sore throat, generalized body aches, headache and chills.  Diagnoses and all orders for this visit:  Influenza-like illness: CBC pointing away from flu and a bacterial etiology.  However, she has had to flu shot and this could be a mild case of the flu.  She does not want to take tamiflu therefore I am not testing her.  Advised 400 mg Ibuprofen in  alternation with 650 tylenol, both q8.  Advised zyrtec for nasal symptoms.  RTC if fever.   -     POCT CBC    The patient was advised to call or return to clinic if she does not see an improvement in symptoms or to seek the care of the closest emergency department if she worsens with the above plan.   Philis Fendt, MHS, PA-C [facility] 03/15/2016 7:53 PM

## 2016-03-25 DIAGNOSIS — H02831 Dermatochalasis of right upper eyelid: Secondary | ICD-10-CM | POA: Diagnosis not present

## 2016-03-25 DIAGNOSIS — H04222 Epiphora due to insufficient drainage, left lacrimal gland: Secondary | ICD-10-CM | POA: Diagnosis not present

## 2016-03-25 DIAGNOSIS — H04221 Epiphora due to insufficient drainage, right lacrimal gland: Secondary | ICD-10-CM | POA: Diagnosis not present

## 2016-03-25 DIAGNOSIS — H04212 Epiphora due to excess lacrimation, left lacrimal gland: Secondary | ICD-10-CM | POA: Diagnosis not present

## 2016-03-25 DIAGNOSIS — H04211 Epiphora due to excess lacrimation, right lacrimal gland: Secondary | ICD-10-CM | POA: Diagnosis not present

## 2016-03-25 DIAGNOSIS — H04551 Acquired stenosis of right nasolacrimal duct: Secondary | ICD-10-CM | POA: Diagnosis not present

## 2016-03-25 DIAGNOSIS — H04552 Acquired stenosis of left nasolacrimal duct: Secondary | ICD-10-CM | POA: Diagnosis not present

## 2016-03-25 DIAGNOSIS — H02834 Dermatochalasis of left upper eyelid: Secondary | ICD-10-CM | POA: Diagnosis not present

## 2016-03-29 ENCOUNTER — Ambulatory Visit (INDEPENDENT_AMBULATORY_CARE_PROVIDER_SITE_OTHER): Payer: Medicare Other

## 2016-03-29 DIAGNOSIS — D519 Vitamin B12 deficiency anemia, unspecified: Secondary | ICD-10-CM

## 2016-03-29 MED ORDER — CYANOCOBALAMIN 1000 MCG/ML IJ SOLN
1000.0000 ug | Freq: Once | INTRAMUSCULAR | Status: AC
  Administered 2016-03-29: 1000 ug via INTRAMUSCULAR

## 2016-04-26 ENCOUNTER — Encounter: Payer: Self-pay | Admitting: Internal Medicine

## 2016-05-03 ENCOUNTER — Ambulatory Visit (INDEPENDENT_AMBULATORY_CARE_PROVIDER_SITE_OTHER): Payer: Medicare Other | Admitting: Geriatric Medicine

## 2016-05-03 DIAGNOSIS — E538 Deficiency of other specified B group vitamins: Secondary | ICD-10-CM | POA: Diagnosis not present

## 2016-05-03 MED ORDER — CYANOCOBALAMIN 1000 MCG/ML IJ SOLN
1000.0000 ug | Freq: Once | INTRAMUSCULAR | Status: AC
  Administered 2016-05-03: 1000 ug via INTRAMUSCULAR

## 2016-05-14 ENCOUNTER — Encounter: Payer: Self-pay | Admitting: Pulmonary Disease

## 2016-05-14 ENCOUNTER — Ambulatory Visit (INDEPENDENT_AMBULATORY_CARE_PROVIDER_SITE_OTHER): Payer: Medicare Other | Admitting: Pulmonary Disease

## 2016-05-14 VITALS — BP 140/80 | HR 74 | Ht 65.5 in | Wt 237.8 lb

## 2016-05-14 DIAGNOSIS — R0600 Dyspnea, unspecified: Secondary | ICD-10-CM

## 2016-05-14 DIAGNOSIS — J438 Other emphysema: Secondary | ICD-10-CM

## 2016-05-14 DIAGNOSIS — G4733 Obstructive sleep apnea (adult) (pediatric): Secondary | ICD-10-CM | POA: Diagnosis not present

## 2016-05-14 NOTE — Progress Notes (Signed)
Subjective:    Patient ID: Meredith [name], female    DOB: [birth year], 65 y.o.   MRN: [id number]  HPI  Chief Complaint  Patient presents with  . Sleep Consult    Referred by Dr. Sharlet Salina; has CPAP machine since 18-Apr-2003, needs to change settings and needs new machine due to weight gain. 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 She would like a new CPAP machine She has been diagnosed with "COPD" by Dr. Evlyn Courier in the past. PSG 04/17/04 showed severe OSA with AHI 51/hour and lowest desaturation to 78%. This was corrected on a subsequent titration study in 7/05 to CPAP of 9 cm and she has been maintained on this level with nasal pillows ever since. She reports good compliance with this and has had the same machine for some many years. She reports significant weight gain over the past 10 years.  Epworth sleepiness score is 5 but she reports excessive daytime tiredness Bedtime is between 11 PM and 1 AM, sleep latency is minimal she sleeps on her right side, denies frequent nocturnal awakenings and is out of bed at 8 AM feeling tired with occasional dryness of mouth but denies headaches There is no history suggestive of cataplexy, sleep paralysis or parasomnias  CPAP has helped her with improvement in her tiredness and somnolence  She reports dyspnea unusual exertion, she feels that this has been constant and attributes this to "old age" and her weight gain   Significant tests/ events  CT chest 06/2010 shows mild emphysema and scattered lung nodules stable since April 17, 2005  PFTs 04/2014 show normal ratio, FEV1 of 94% and DLCO 77%   Past Medical History  Diagnosis Date  . Unspecified sleep apnea   . Other and unspecified hyperlipidemia   . Depressive disorder, not elsewhere classified   . Anxiety state, unspecified   . Asthma   . Diabetes mellitus without complication ([place])   . Anemia   . CIN I (cervical intraepithelial neoplasia  I)   . B12 deficiency anemia 05/2014 dx  . Internal hemorrhoids   . Arthritis     KNEES  . COPD (chronic obstructive pulmonary disease) ([place])   . Emphysema of lung ([place])   . Sleep apnea   . Hypertension     Past Surgical History  Procedure Laterality Date  . Hemorrhoid surgery    . Cesarean section    . Knee arthroscopy Left 12/2014  . Colonoscopy    . Upper gastrointestinal endoscopy       No Known Allergies  Social History   Social History  . Marital Status: Divorced    Spouse Name: N/A  . Number of Children: 3  . Years of Education: 14   Occupational History  . counselor/therapist    Social History Main Topics  . Smoking status: Former Smoker -- 0.50 packs/day for 14 years    Types: Cigarettes  . Smokeless tobacco: Former Systems developer    Quit date: 10/02/2013  . Alcohol Use: 3.0 - 3.6 oz/week    5-6 Standard drinks or equivalent per week  . Drug Use: No  . Sexual Activity: No   Other Topics Concern  . 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 BA- psych from [employer], [place]. Married - 14 years -divorced. 3 daughters - twins 9 - one dtr,Meredith [name], died 04-17-12 unknown causes, 66   Work: Biomedical engineer  substance abuse counselor. No h/o physical or sexual abuse     Family History  Problem Relation Age of Onset  . Heart attack Father 70  . Diabetes Father   . Cancer Father     lung  . Heart disease Father     Review of Systems  Constitutional: Negative for fever, chills and unexpected weight change.  HENT: Negative for congestion, dental problem, ear pain, nosebleeds, postnasal drip, rhinorrhea, sinus pressure, sneezing, sore throat, trouble swallowing and voice change.   Eyes: Negative for visual disturbance.  Respiratory: Negative for cough, choking and shortness of breath.   Cardiovascular: Negative for chest pain and leg swelling.  Gastrointestinal: Negative for vomiting, abdominal pain and  diarrhea.  Genitourinary: Negative for difficulty urinating.  Musculoskeletal: Negative for arthralgias.  Skin: Negative for rash.  Neurological: Negative for tremors, syncope and headaches.  Hematological: Does not bruise/bleed easily.       Objective:   Physical Exam  Gen. Pleasant, obese, in no distress ENT - no lesions, no post nasal drip Neck: No JVD, no thyromegaly, no carotid bruits Lungs: no use of accessory muscles, no dullness to percussion, decreased without rales or rhonchi  Cardiovascular: Rhythm regular, heart sounds  normal, no murmurs or gallops, no peripheral edema Musculoskeletal: No deformities, no cyanosis or clubbing , no tremors       Assessment & Plan:

## 2016-05-14 NOTE — Assessment & Plan Note (Signed)
She does not have significant airway obstruction Dyspnea may simply be related to weight gain and deconditioning however cardiac issues will need to be ruled out For your shortness of breath, schedule echocardiogram

## 2016-05-14 NOTE — Patient Instructions (Signed)
Schedule CPAP titration study Based on these results, we will set you up with a new CPAP machine  You do not have COPD For your shortness of breath, schedule echocardiogram

## 2016-05-14 NOTE — Assessment & Plan Note (Signed)
Schedule CPAP titration study Based on these results, we will set you up with a new CPAP machine  Weight loss encouraged, compliance with goal of at least 4-6 hrs every night is the expectation. Advised against medications with sedative side effects Cautioned against driving when sleepy - understanding that sleepiness will vary on a day to day basis

## 2016-05-16 DIAGNOSIS — H25012 Cortical age-related cataract, left eye: Secondary | ICD-10-CM | POA: Diagnosis not present

## 2016-05-16 DIAGNOSIS — H2512 Age-related nuclear cataract, left eye: Secondary | ICD-10-CM | POA: Diagnosis not present

## 2016-05-16 DIAGNOSIS — H35032 Hypertensive retinopathy, left eye: Secondary | ICD-10-CM | POA: Diagnosis not present

## 2016-05-16 DIAGNOSIS — H2511 Age-related nuclear cataract, right eye: Secondary | ICD-10-CM | POA: Diagnosis not present

## 2016-05-16 DIAGNOSIS — H25011 Cortical age-related cataract, right eye: Secondary | ICD-10-CM | POA: Diagnosis not present

## 2016-05-16 DIAGNOSIS — H35031 Hypertensive retinopathy, right eye: Secondary | ICD-10-CM | POA: Diagnosis not present

## 2016-05-22 DIAGNOSIS — H04123 Dry eye syndrome of bilateral lacrimal glands: Secondary | ICD-10-CM | POA: Diagnosis not present

## 2016-05-22 DIAGNOSIS — H01003 Unspecified blepharitis right eye, unspecified eyelid: Secondary | ICD-10-CM | POA: Diagnosis not present

## 2016-05-22 DIAGNOSIS — H1852 Epithelial (juvenile) corneal dystrophy: Secondary | ICD-10-CM | POA: Diagnosis not present

## 2016-05-29 ENCOUNTER — Other Ambulatory Visit: Payer: Self-pay

## 2016-05-29 ENCOUNTER — Ambulatory Visit (HOSPITAL_COMMUNITY): Payer: Medicare Other | Attending: Cardiovascular Disease

## 2016-05-29 DIAGNOSIS — Z87891 Personal history of nicotine dependence: Secondary | ICD-10-CM | POA: Diagnosis not present

## 2016-05-29 DIAGNOSIS — Z8249 Family history of ischemic heart disease and other diseases of the circulatory system: Secondary | ICD-10-CM | POA: Diagnosis not present

## 2016-05-29 DIAGNOSIS — G4733 Obstructive sleep apnea (adult) (pediatric): Secondary | ICD-10-CM

## 2016-05-29 DIAGNOSIS — R0600 Dyspnea, unspecified: Secondary | ICD-10-CM | POA: Diagnosis not present

## 2016-05-29 DIAGNOSIS — E119 Type 2 diabetes mellitus without complications: Secondary | ICD-10-CM | POA: Insufficient documentation

## 2016-05-29 DIAGNOSIS — I119 Hypertensive heart disease without heart failure: Secondary | ICD-10-CM | POA: Diagnosis not present

## 2016-05-29 DIAGNOSIS — E785 Hyperlipidemia, unspecified: Secondary | ICD-10-CM | POA: Insufficient documentation

## 2016-06-04 ENCOUNTER — Ambulatory Visit (INDEPENDENT_AMBULATORY_CARE_PROVIDER_SITE_OTHER): Payer: Medicare Other

## 2016-06-04 DIAGNOSIS — E538 Deficiency of other specified B group vitamins: Secondary | ICD-10-CM

## 2016-06-04 MED ORDER — CYANOCOBALAMIN 1000 MCG/ML IJ SOLN
1000.0000 ug | Freq: Once | INTRAMUSCULAR | Status: AC
  Administered 2016-06-04: 1000 ug via INTRAMUSCULAR

## 2016-06-07 ENCOUNTER — Telehealth: Payer: Self-pay | Admitting: Pulmonary Disease

## 2016-06-07 DIAGNOSIS — R931 Abnormal findings on diagnostic imaging of heart and coronary circulation: Secondary | ICD-10-CM

## 2016-06-07 NOTE — Telephone Encounter (Signed)
Notes Recorded by Glean Hess, CMA on 06/06/2016 at 11:41 AM Left message for patient to call back. ------ Notes Recorded by Glean Hess, CMA on 06/04/2016 at 4:34 PM Left message for patient to call back. ------ Notes Recorded by Rigoberto Noel, MD on 06/03/2016 at 8:59 AM Echo shows normal LV function but she has a stiff left ventricle-which may be causing her dyspnea Referred to cardiology for opinion ----------------------------------------------------- Spoke with pt. She is aware of results. Referral has been made for Cardiology. Nothing further was needed at this time.

## 2016-06-11 ENCOUNTER — Ambulatory Visit (HOSPITAL_BASED_OUTPATIENT_CLINIC_OR_DEPARTMENT_OTHER): Payer: Medicare Other | Attending: Pulmonary Disease | Admitting: Pulmonary Disease

## 2016-06-11 ENCOUNTER — Telehealth: Payer: Self-pay | Admitting: Pulmonary Disease

## 2016-06-11 DIAGNOSIS — Z7984 Long term (current) use of oral hypoglycemic drugs: Secondary | ICD-10-CM | POA: Diagnosis not present

## 2016-06-11 DIAGNOSIS — G4733 Obstructive sleep apnea (adult) (pediatric): Secondary | ICD-10-CM | POA: Insufficient documentation

## 2016-06-11 DIAGNOSIS — R0683 Snoring: Secondary | ICD-10-CM | POA: Diagnosis not present

## 2016-06-11 DIAGNOSIS — Z79899 Other long term (current) drug therapy: Secondary | ICD-10-CM | POA: Insufficient documentation

## 2016-06-11 NOTE — Telephone Encounter (Signed)
Randa Spike, CMA at 06/07/2016 2:52 PM     Status: Signed       Expand All Collapse All   Notes Recorded by Glean Hess, CMA on 06/06/2016 at 11:41 AM Left message for patient to call back. ------ Notes Recorded by Glean Hess, CMA on 06/04/2016 at 4:34 PM Left message for patient to call back. ------ Notes Recorded by Rigoberto Noel, MD on 06/03/2016 at 8:59 AM Echo shows normal LV function but she has a stiff left ventricle-which may be causing her dyspnea Referred to cardiology for opinion ----------------------------------------------------- Spoke with pt. She is aware of results. Referral has been made for Cardiology. Nothing further was needed at this time.         LMTCB-appears patient was made aware of results previously.

## 2016-06-11 NOTE — Telephone Encounter (Signed)
[phone], pt cb

## 2016-06-11 NOTE — Telephone Encounter (Signed)
Spoke with patient-she states she has not heard from Cardiology for appt. Pt states she was told that she would be contacted quickly on 06-07-16. Pt is aware that I have spoken with [place], our [place] she is reaching out to Cardiology for appt.  Will forward to [facility] to document.

## 2016-06-11 NOTE — Telephone Encounter (Signed)
I called and spoke with Donna @[facility]  Per [facility], pt scheduled 06/20/16 arrival @10 :15 am with Dr. 07/03/16 at the [facility] office.  Called and spoke with patient, she is aware of appt & location info. Nothing further needed.

## 2016-06-13 ENCOUNTER — Telehealth: Payer: Self-pay | Admitting: Pulmonary Disease

## 2016-06-13 NOTE — Telephone Encounter (Signed)
LMTCB

## 2016-06-14 NOTE — Telephone Encounter (Signed)
Notes Recorded by Glean Hess, CMA on 06/13/2016 at 5:20 PM Left message for patient to call back. ------ Notes Recorded by Randa Spike, CMA on 06/11/2016 at 11:17 AM lmtcb x3 for pt. ------ Notes Recorded by Glean Hess, CMA on 06/06/2016 at 11:41 AM Left message for patient to call back. ------ Notes Recorded by Glean Hess, CMA on 06/04/2016 at 4:34 PM Left message for patient to call back. ------ Notes Recorded by Rigoberto Noel, MD on 06/03/2016 at 8:59 AM Echo shows normal LV function but she has a stiff left ventricle-which may be causing her dyspnea Referred to cardiology for opinion ------------------------------------------------------ lmtcb for pt.

## 2016-06-14 NOTE — Telephone Encounter (Signed)
Attempted to contact patient, left message for patient to call back.

## 2016-06-14 NOTE — Telephone Encounter (Signed)
Pt calling back  [phone]

## 2016-06-17 ENCOUNTER — Telehealth: Payer: Self-pay | Admitting: Pulmonary Disease

## 2016-06-17 DIAGNOSIS — G4733 Obstructive sleep apnea (adult) (pediatric): Secondary | ICD-10-CM | POA: Diagnosis not present

## 2016-06-17 NOTE — Telephone Encounter (Signed)
Pt returning call and can be reached @ [phone].Meredith [name]

## 2016-06-17 NOTE — Telephone Encounter (Signed)
Spoke with pt. She has already been made aware of these results. Nothing further was needed.

## 2016-06-17 NOTE — Telephone Encounter (Signed)
Reason for titration study please send a prescription for  CPAP therapy on 12 cm H2O with a Small size Resmed Nasal Pillow Mask AirFit P10 mask and heated humidification  Download in 4 weeks Office visit in 6 weeks with TP

## 2016-06-17 NOTE — Telephone Encounter (Signed)
lmtcb for pt.  

## 2016-06-17 NOTE — Telephone Encounter (Signed)
Pt returing call and can be reached @ [phone].Meredith [name]

## 2016-06-17 NOTE — Procedures (Signed)
Patient Name: Meredith, [name] Date: 06/11/2016 Gender: Female D.O.B: 05/17/1951 Age (years): 51 Referring Provider: Kara Mead MD, ABSM Height (inches): 66 Interpreting Physician: Kara Mead MD, ABSM Weight (lbs): 233 RPSGT: Madelon Lips BMI: 38 MRN: [id number] Neck Size: 16.00   CLINICAL INFORMATION The patient is referred for a CPAP titration to treat sleep apnea. Date of NPSG: 2005 showed severe OSA with AHI 51/hour and lowest desaturation to 78%. This was corrected on a subsequent titration study in 7/05 to CPAP of 9 cm and she has been maintained on this level with nasal pillows ever since   [place] As per the AASM Manual for the Scoring of Sleep and Associated Events v2.3 (April 2016) with a hypopnea requiring 4% desaturations. The channels recorded and monitored were frontal, central and occipital EEG, electrooculogram (EOG), submentalis EMG (chin), nasal and oral airflow, thoracic and abdominal wall motion, anterior tibialis EMG, snore microphone, electrocardiogram, and pulse oximetry. Continuous positive airway pressure (CPAP) was initiated at the beginning of the study and titrated to treat sleep-disordered breathing.   MEDICATIONS Medications taken by the patient : METFORMIN, DOXYCYCLINE, XANAX  Medications administered by patient during sleep study : No sleep medicine administered.   RESPIRATORY PARAMETERS Optimal PAP Pressure (cm): 12 AHI at Optimal Pressure (/hr): 3.5 Overall Minimal O2 (%): 89.00 Supine % at Optimal Pressure (%): 100 Minimal O2 at Optimal Pressure (%): 89.0     SLEEP ARCHITECTURE The study was initiated at 10:42:04 PM and ended at 4:50:23 AM. Sleep onset time was 9.0 minutes and the sleep efficiency was 87.4%. The total sleep time was 321.8 minutes. The patient spent 7.77% of the night in stage N1 sleep, 58.21% in stage N2 sleep, 1.24% in stage N3 and 32.78% in REM.Stage REM latency was 171.5 minutes Wake after sleep onset was  37.5. Alpha intrusion was absent. Supine sleep was 76.02%.   CARDIAC DATA The 2 lead EKG demonstrated sinus rhythm. The mean heart rate was 64.77 beats per minute. Other EKG findings include: None.   LEG MOVEMENT DATA The total Periodic Limb Movements of Sleep (PLMS) were 0. The PLMS index was 0.00. A PLMS index of <15 is considered normal in adults.   IMPRESSIONS - The optimal PAP pressure was 12 cm of water. - Central sleep apnea was not noted during this titration (CAI = 0.0/h). - Mild oxygen desaturations were observed during this titration (min O2 = 89.00%). - The patient snored with Moderate snoring volume during this titration study. - No cardiac abnormalities were observed during this study. - Clinically significant periodic limb movements were not noted during this study. Arousals associated with PLMs were rare.   DIAGNOSIS - Obstructive Sleep Apnea (327.23 [G47.33 ICD-10])   RECOMMENDATIONS - Trial of CPAP therapy on 12 cm H2O with a Small size Resmed Nasal Pillow Mask AirFit P10 mask and heated humidification. - Avoid alcohol, sedatives and other CNS depressants that may worsen sleep apnea and disrupt normal sleep architecture. - Sleep hygiene should be reviewed to assess factors that may improve sleep quality. - Weight management and regular exercise should be initiated or continued. - Return to [facility] for re-evaluation after 4 weeks of therapy  Kara Mead MD. FCCP. [facility]   06/17/2016

## 2016-06-19 NOTE — Telephone Encounter (Signed)
Pt is returning call. Please call her back on [phone]

## 2016-06-19 NOTE — Telephone Encounter (Signed)
Pt aware that we are sending an order for a new CPAP machine and mask to her DME.  Pt scheduled for OV with TP 07/31/16 at 10:15 for 6 weeks f/u Pt aware to bring SD card to appt with TP. Nothing further needed.

## 2016-06-19 NOTE — Progress Notes (Signed)
Cardiology Office Note   Date:  06/20/2016   ID:  Meredith, [name] [birth year], MRN [id number]  PCP:  Hoyt Koch, MD  Cardiologist:   Minus Breeding, MD   Chief Complaint  Patient presents with  . Shortness of Breath      History of Present Illness: Meredith [name] is a 65 y.o. female who presents for valuation of shortness of breath.  She has no past cardiac history. However, she's had increasing dyspnea. This has been happening chronically over time. She is short of breath walking a short distance perhaps 20 yards on level ground. 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 She's not describing PND or orthopnea. She's not describing chest pressure, neck or arm discomfort. She doesn't have cough fevers or chills. She was told she has some mild chronic lung disease but her symptoms seem to be out of proportion to this. She does have sleep apnea and wears CPAP. She thought that her symptoms were related to deconditioning because she's been very inactive. She does not describe chest pressure, neck or arm discomfort. She doesn't have significant palpitations, presyncope or syncope. She has trace lower extremity edema. She did have an echocardiogram and I reviewed this result. She was told of his valvular disease but there is no valvular disease listed. She has well preserved systolic function with some diastolic dysfunction noted.    Past Medical History  Diagnosis Date  . Unspecified sleep apnea   . Other and unspecified hyperlipidemia   . Depressive disorder, not elsewhere classified   . Anxiety state, unspecified   . Diabetes mellitus without complication ([place])   . Anemia   . CIN I (cervical intraepithelial neoplasia I)   . B12 deficiency anemia 05/2014 dx  . Internal hemorrhoids   . Arthritis     KNEES  . Emphysema of lung ([place])   . Hypertension     Past Surgical History  Procedure  Laterality Date  . Hemorrhoid surgery    . Cesarean section    . Knee arthroscopy Left 12/2014  . Colonoscopy    . Upper gastrointestinal endoscopy       Current Outpatient Prescriptions  Medication Sig Dispense Refill  . ALPRAZolam (NIRAVAM) 0.5 MG dissolvable tablet Take 1 tablet (0.5 mg total) by mouth every 6 (six) hours as needed for anxiety (increased anxiety). 20 tablet 0  . aspirin 81 MG tablet Take 81 mg by mouth daily.      . cyanocobalamin (,VITAMIN B-12,) 1000 MCG/ML injection Inject 1,000 mcg into the muscle every 30 (thirty) days.    Marland Kitchen escitalopram (LEXAPRO) 10 MG tablet TAKE 1 TABLET BY MOUTH DAILY 90 tablet 1  . folic acid (FOLVITE) A999333 MCG tablet Take 400 mcg by mouth daily.    . furosemide (LASIX) 20 MG tablet Take 1 tablet (20 mg total) by mouth daily. 90 tablet 3  . irbesartan (AVAPRO) 300 MG tablet Take 1 tablet (300 mg total) by mouth daily. 90 tablet 3  . metFORMIN (GLUCOPHAGE) 1000 MG tablet TAKE 1 TABLET(1000 MG) BY MOUTH TWICE DAILY WITH A MEAL 180 tablet 0  . simvastatin (ZOCOR) 40 MG tablet Take 1 tablet (40 mg total) by mouth at bedtime. 90 tablet 3   No current facility-administered medications for this visit.    Allergies:   Review of patient's allergies indicates no known allergies.    Social History:  The patient  reports that she has quit smoking. Her smoking use included Cigarettes. She has a 7 pack-year smoking history. She quit smokeless tobacco use about 2 years ago. She reports that she drinks about 3.0 - 3.6 oz of alcohol per week. She reports that she does not use illicit drugs.   Family History:  The patient's family history includes Cancer in her father; Diabetes in her father; Heart attack (age of onset: 77) in her father; Heart disease in her father.    ROS:  Please see the history of present illness.   Otherwise, review of systems are positive for none.   All other systems are reviewed and negative.    PHYSICAL EXAM: VS:  BP 138/82 mmHg   Pulse 67  Ht 5\' 5"  (1.651 m)  Wt 236 lb (107.049 kg)  BMI 39.27 kg/m2  LMP 12/30/1998 (Approximate) , BMI Body mass index is 39.27 kg/(m^2). GENERAL:  Well appearing HEENT:  Pupils equal round and reactive, fundi not visualized, oral mucosa unremarkable NECK:  No jugular venous distention, waveform within normal limits, carotid upstroke brisk and symmetric, no bruits, no thyromegaly LYMPHATICS:  No cervical, inguinal adenopathy LUNGS:  Clear to auscultation bilaterally BACK:  No CVA tenderness CHEST:  Unremarkable HEART:  PMI not displaced or sustained,S1 and S2 within normal limits, no S3, no S4, no clicks, no rubs, no murmurs ABD:  Flat, positive bowel sounds normal in frequency in pitch, no bruits, no rebound, no guarding, no midline pulsatile mass, no hepatomegaly, no splenomegaly EXT:  2 plus pulses throughout, trace edema, no cyanosis no clubbing SKIN:  No rashes no nodules NEURO:  Cranial nerves II through XII grossly intact, motor grossly intact throughout PSYCH:  Cognitively intact, oriented to person place and time    EKG:  EKG is ordered today. The ekg ordered today demonstrates sinus rhythm, rate 67, axis within normal limits, intervals within normal limits, no acute ST-T wave changes.   Recent Labs: 06/29/2015: ALT 14; BUN 10; Creatinine, Ser 0.63; Potassium 3.9; Sodium 139; TSH 1.83 02/27/2016: Platelets 267.0 03/15/2016: Hemoglobin 11.9*    Lipid Panel    Component Value Date/Time   CHOL 171 02/27/2016 0935   TRIG 176.0* 02/27/2016 0935   HDL 41.60 02/27/2016 0935   CHOLHDL 4 02/27/2016 0935   VLDL 35.2 02/27/2016 0935   LDLCALC 95 02/27/2016 0935      Wt Readings from Last 3 Encounters:  06/20/16 236 lb (107.049 kg)  06/11/16 233 lb (105.688 kg)  05/14/16 237 lb 12.8 oz (107.865 kg)      Other studies Reviewed: Additional studies/ records that were reviewed today include: Echo. Review of the above records demonstrates:  Please see elsewhere in the  note.     ASSESSMENT AND PLAN:  DYSPNEA:  I suspect that this is multifactorial. I don't strongly suspect heart failure or ischemic etiology although she does have risk factors. I'm going to start with a BNP level. We discussed at length an exercise regimen that should improve her symptoms. I would like to bring her back screening. I will bring the patient back for a POET (Plain Old Exercise Test). This will allow me to screen for obstructive coronary disease, risk stratify and very importantly provide a prescription for exercise.  DIASTOLIC DYSFUNCTION:  She does have some evidence of this and this may play a role as above. We talked about salt and fluid restriction and blood pressure management.  HTN:  The blood pressure is at target. No change in  medications is indicated. We will continue with therapeutic lifestyle changes (TLC).  OBESITY:  The patient understands the need to lose weight with diet and exercise. We have discussed specific strategies for this.   Current medicines are reviewed at length with the patient today.  The patient does not have concerns regarding medicines.  The following changes have been made:  no change  Labs/ tests ordered today include:   Orders Placed This Encounter  Procedures  . B Nat Peptide  . EXERCISE TOLERANCE TEST  . EKG 12-Lead     Disposition:   FU with me in 3 months.     Signed, Minus Breeding, MD  06/20/2016 12:53 PM    [facility]

## 2016-06-19 NOTE — Telephone Encounter (Signed)
Left message for patient to call back  

## 2016-06-20 ENCOUNTER — Ambulatory Visit (INDEPENDENT_AMBULATORY_CARE_PROVIDER_SITE_OTHER): Payer: Medicare Other | Admitting: Cardiology

## 2016-06-20 ENCOUNTER — Encounter: Payer: Self-pay | Admitting: Cardiology

## 2016-06-20 VITALS — BP 138/82 | HR 67 | Ht 65.0 in | Wt 236.0 lb

## 2016-06-20 DIAGNOSIS — R0602 Shortness of breath: Secondary | ICD-10-CM

## 2016-06-20 NOTE — Patient Instructions (Signed)
Medication Instructions:  Continue current medications  Labwork: BNP today  Testing/Procedures: Your physician has requested that you have an exercise tolerance test in 3 Months. For further information please visit [email or web address]. Please also follow instruction sheet, as given.  Follow-Up: Your physician recommends that you schedule a follow-up appointment in: 3 Months   Any Other Special Instructions Will Be Listed Below (If Applicable).  If you need a refill on your cardiac medications before your next appointment, please call your pharmacy.

## 2016-06-21 LAB — BRAIN NATRIURETIC PEPTIDE: Brain Natriuretic Peptide: 16.1 pg/mL (ref <100)

## 2016-07-04 DIAGNOSIS — H04123 Dry eye syndrome of bilateral lacrimal glands: Secondary | ICD-10-CM | POA: Diagnosis not present

## 2016-07-04 DIAGNOSIS — H18839 Recurrent erosion of cornea, unspecified eye: Secondary | ICD-10-CM | POA: Diagnosis not present

## 2016-07-04 DIAGNOSIS — H1852 Epithelial (juvenile) corneal dystrophy: Secondary | ICD-10-CM | POA: Diagnosis not present

## 2016-07-04 DIAGNOSIS — H01003 Unspecified blepharitis right eye, unspecified eyelid: Secondary | ICD-10-CM | POA: Diagnosis not present

## 2016-07-05 ENCOUNTER — Ambulatory Visit (INDEPENDENT_AMBULATORY_CARE_PROVIDER_SITE_OTHER): Payer: Medicare Other | Admitting: *Deleted

## 2016-07-05 DIAGNOSIS — D519 Vitamin B12 deficiency anemia, unspecified: Secondary | ICD-10-CM

## 2016-07-05 MED ORDER — CYANOCOBALAMIN 1000 MCG/ML IJ SOLN
1000.0000 ug | Freq: Once | INTRAMUSCULAR | Status: AC
  Administered 2016-07-05: 1000 ug via INTRAMUSCULAR

## 2016-07-10 ENCOUNTER — Telehealth: Payer: Self-pay | Admitting: Pulmonary Disease

## 2016-07-10 NOTE — Telephone Encounter (Signed)
Called spoke with pt. She reports she has been having hard time getting appts with [facility] for her CPAP.  She reports they will not set her up with her 'cpap teaching' until she gives them a credit to put on file.  She is wanting to cancel her order w/ [facility] and wants her order sent to [facility] instead. Please advise PCC's thanks

## 2016-07-11 NOTE — Telephone Encounter (Signed)
I spoke to [place] at [facility] & confirmed they needed pt's card to have in file in case her ins changes & she should have a copay in the future.  [facility] does not file Medicare insurance.  I called pt & left her vm to call me back.

## 2016-07-11 NOTE — Telephone Encounter (Signed)
I spoke to pt & she decided she wanted order to go to [facility].  I faxed order.  Nothing further needed.

## 2016-07-18 ENCOUNTER — Encounter: Payer: Self-pay | Admitting: Pulmonary Disease

## 2016-07-31 ENCOUNTER — Ambulatory Visit: Payer: Medicare Other | Admitting: Adult Health

## 2016-08-05 ENCOUNTER — Ambulatory Visit (INDEPENDENT_AMBULATORY_CARE_PROVIDER_SITE_OTHER): Payer: Medicare Other

## 2016-08-05 DIAGNOSIS — E538 Deficiency of other specified B group vitamins: Secondary | ICD-10-CM | POA: Diagnosis not present

## 2016-08-05 MED ORDER — CYANOCOBALAMIN 1000 MCG/ML IJ SOLN
1000.0000 ug | Freq: Once | INTRAMUSCULAR | Status: AC
  Administered 2016-08-05: 1000 ug via INTRAMUSCULAR

## 2016-08-17 ENCOUNTER — Other Ambulatory Visit: Payer: Self-pay | Admitting: Internal Medicine

## 2016-08-30 ENCOUNTER — Ambulatory Visit (INDEPENDENT_AMBULATORY_CARE_PROVIDER_SITE_OTHER): Payer: Medicare Other | Admitting: Internal Medicine

## 2016-08-30 ENCOUNTER — Encounter: Payer: Self-pay | Admitting: Internal Medicine

## 2016-08-30 ENCOUNTER — Ambulatory Visit: Payer: Medicare Other | Admitting: Adult Health

## 2016-08-30 ENCOUNTER — Other Ambulatory Visit (INDEPENDENT_AMBULATORY_CARE_PROVIDER_SITE_OTHER): Payer: Medicare Other

## 2016-08-30 VITALS — BP 122/70 | HR 64 | Temp 98.6°F | Resp 14 | Ht 65.0 in | Wt 236.0 lb

## 2016-08-30 DIAGNOSIS — Z23 Encounter for immunization: Secondary | ICD-10-CM | POA: Diagnosis not present

## 2016-08-30 DIAGNOSIS — I10 Essential (primary) hypertension: Secondary | ICD-10-CM

## 2016-08-30 DIAGNOSIS — E785 Hyperlipidemia, unspecified: Secondary | ICD-10-CM

## 2016-08-30 DIAGNOSIS — E538 Deficiency of other specified B group vitamins: Secondary | ICD-10-CM | POA: Diagnosis not present

## 2016-08-30 DIAGNOSIS — E119 Type 2 diabetes mellitus without complications: Secondary | ICD-10-CM

## 2016-08-30 DIAGNOSIS — F32A Depression, unspecified: Secondary | ICD-10-CM

## 2016-08-30 DIAGNOSIS — F329 Major depressive disorder, single episode, unspecified: Secondary | ICD-10-CM

## 2016-08-30 LAB — COMPREHENSIVE METABOLIC PANEL
ALK PHOS: 71 U/L (ref 39–117)
ALT: 12 U/L (ref 0–35)
AST: 13 U/L (ref 0–37)
Albumin: 3.8 g/dL (ref 3.5–5.2)
BILIRUBIN TOTAL: 0.4 mg/dL (ref 0.2–1.2)
BUN: 14 mg/dL (ref 6–23)
CALCIUM: 8.9 mg/dL (ref 8.4–10.5)
CO2: 29 meq/L (ref 19–32)
Chloride: 105 mEq/L (ref 96–112)
Creatinine, Ser: 0.66 mg/dL (ref 0.40–1.20)
GFR: 95.05 mL/min (ref >60.00)
Glucose, Bld: 155 mg/dL — ABNORMAL HIGH (ref 70–99)
POTASSIUM: 3.9 meq/L (ref 3.5–5.1)
Sodium: 140 mEq/L (ref 135–145)
Total Protein: 7.1 g/dL (ref 6.0–8.3)

## 2016-08-30 LAB — FERRITIN: Ferritin: 12.1 ng/mL (ref 10.0–291.0)

## 2016-08-30 LAB — LIPID PANEL
CHOLESTEROL: 175 mg/dL (ref 0–200)
HDL: 43.2 mg/dL (ref >39.00)
LDL CALC: 101 mg/dL — AB (ref 0–99)
NonHDL: 131.33
TRIGLYCERIDES: 152 mg/dL — AB (ref 0.0–149.0)
Total CHOL/HDL Ratio: 4
VLDL: 30.4 mg/dL (ref 0.0–40.0)

## 2016-08-30 LAB — TSH: TSH: 2.2 u[IU]/mL (ref 0.35–4.50)

## 2016-08-30 LAB — HEMOGLOBIN A1C: HEMOGLOBIN A1C: 7.7 % — AB (ref 4.6–6.5)

## 2016-08-30 MED ORDER — SIMVASTATIN 40 MG PO TABS: 40.0000 mg | ORAL_TABLET | Freq: Every day | ORAL | 3 refills | Status: DC

## 2016-08-30 MED ORDER — CYANOCOBALAMIN 1000 MCG/ML IJ SOLN
1000.0000 ug | Freq: Once | INTRAMUSCULAR | Status: AC
  Administered 2016-08-30: 1000 ug via INTRAMUSCULAR

## 2016-08-30 MED ORDER — METFORMIN HCL 1000 MG PO TABS: ORAL_TABLET | ORAL | 3 refills | Status: DC

## 2016-08-30 MED ORDER — ALPRAZOLAM 0.5 MG PO TBDP: 0.5000 mg | ORAL_TABLET | Freq: Four times a day (QID) | ORAL | 0 refills | Status: DC | PRN

## 2016-08-30 MED ORDER — FUROSEMIDE 20 MG PO TABS: ORAL_TABLET | ORAL | 3 refills | Status: DC

## 2016-08-30 MED ORDER — ESCITALOPRAM OXALATE 10 MG PO TABS: 10.0000 mg | ORAL_TABLET | Freq: Every day | ORAL | 3 refills | Status: DC

## 2016-08-30 MED ORDER — IRBESARTAN 300 MG PO TABS: 300.0000 mg | ORAL_TABLET | Freq: Every day | ORAL | 3 refills | Status: DC

## 2016-08-30 NOTE — Assessment & Plan Note (Signed)
BP at goal on the avapro and lasix. Checking CMP and adjust as needed.

## 2016-08-30 NOTE — Addendum Note (Signed)
Addended by: Resa Miner R on: 08/30/2016 02:42 PM   Modules accepted: Orders

## 2016-08-30 NOTE — Assessment & Plan Note (Signed)
Foot exam done today. Checking HgA1c and adjust as needed. Taking metformin 1000 mg BID. Also on ARB and statin. Eye exam up to date. Given flu shot today. Not complicated.

## 2016-08-30 NOTE — Progress Notes (Signed)
   Subjective:    Patient ID: Meredith [name], female    DOB: 01-Sep-1951, 65 y.o.   MRN: [id number]  HPI The patient is a 65 YO female coming in for follow up of her cholesterol (now just on simvastatin from previous vytorin and is concerned about her cholesterol, no side effects) and her sugars (previous HgA1c 7.7, taking metformin 1000 mg BID, not complicated), and her blood pressure (BP at goal on her lasix, irbesartam, not complicated).   Review of Systems  Constitutional: Negative for activity change, appetite change, fatigue, fever and unexpected weight change.  HENT: Negative.   Eyes: Negative.   Respiratory: Negative for cough, chest tightness, shortness of breath and wheezing.   Cardiovascular: Negative for chest pain, palpitations and leg swelling.  Gastrointestinal: Negative for abdominal distention, constipation, diarrhea and nausea.  Musculoskeletal: Negative.   Skin: Negative.   Neurological: Negative for dizziness, tremors, seizures, syncope, facial asymmetry, weakness, light-headedness, numbness and headaches.      Objective:   Physical Exam  Constitutional: She is oriented to person, place, and time. She appears well-developed and well-nourished.  HENT:  Head: Normocephalic and atraumatic.  Eyes: EOM are normal.  Neck: Normal range of motion.  Cardiovascular: Normal rate and regular rhythm.   Pulmonary/Chest: Effort normal and breath sounds normal. No respiratory distress. She has no wheezes. She has no rales.  Abdominal: Soft. Bowel sounds are normal. She exhibits no distension.  Musculoskeletal: She exhibits no edema.  Neurological: She is alert and oriented to person, place, and time. Coordination normal.  Skin: Skin is warm and dry.  See foot exam   Vitals:   08/30/16 0933  BP: 122/70  Pulse: 64  Resp: 14  Temp: 98.6 F (37 C)  TempSrc: Oral  SpO2: 97%  Weight: 236 lb (107 kg)  Height: 5\' 5"  (1.651 m)      Assessment & Plan:  B12 and high dose flu shot  given at visit.

## 2016-08-30 NOTE — Assessment & Plan Note (Signed)
Checking lipid panel on the simvastatin as she is still not convinced she doesn't need vytorin. No side effects. Adjust as needed.

## 2016-08-30 NOTE — Assessment & Plan Note (Signed)
Doing well on her lexapro with rare xanax (30 lasts 1 year).

## 2016-08-30 NOTE — Progress Notes (Signed)
Pre visit review using our clinic review tool, if applicable. No additional management support is needed unless otherwise documented below in the visit note. 

## 2016-08-30 NOTE — Patient Instructions (Signed)
We have done the refills today and will check the blood work.   Keep up the good work with the exercise.

## 2016-09-03 ENCOUNTER — Telehealth: Payer: Self-pay | Admitting: *Deleted

## 2016-09-03 NOTE — Telephone Encounter (Signed)
Patient in 08 recall for 07/2016. Please contact patient and schedule PAP/AEX Thanks Margaretha Sheffield

## 2016-09-04 ENCOUNTER — Ambulatory Visit (HOSPITAL_COMMUNITY)
Admission: RE | Admit: 2016-09-04 | Discharge: 2016-09-04 | Disposition: A | Discharge status: Home / Self Care | Payer: Medicare Other | Source: Ambulatory Visit | Attending: Cardiology | Admitting: Cardiology

## 2016-09-04 ENCOUNTER — Other Ambulatory Visit: Payer: Self-pay | Admitting: Physician Assistant

## 2016-09-04 DIAGNOSIS — R0609 Other forms of dyspnea: Principal | ICD-10-CM

## 2016-09-04 DIAGNOSIS — R0602 Shortness of breath: Secondary | ICD-10-CM | POA: Diagnosis not present

## 2016-09-04 LAB — EXERCISE TOLERANCE TEST
CHL CUP RESTING HR STRESS: 75 {beats}/min
CSEPED: 3 min
CSEPEW: 4.7 METS
CSEPPHR: 150 {beats}/min
Exercise duration (sec): 8 s
MPHR: 154 {beats}/min
Percent HR: 97 %
RPE: 18

## 2016-09-11 ENCOUNTER — Telehealth (HOSPITAL_COMMUNITY): Payer: Self-pay

## 2016-09-11 NOTE — Telephone Encounter (Signed)
Encounter complete. 

## 2016-09-13 ENCOUNTER — Encounter: Payer: Self-pay | Admitting: Adult Health

## 2016-09-13 ENCOUNTER — Ambulatory Visit (INDEPENDENT_AMBULATORY_CARE_PROVIDER_SITE_OTHER): Payer: Medicare Other | Admitting: Adult Health

## 2016-09-13 ENCOUNTER — Ambulatory Visit (HOSPITAL_COMMUNITY)
Admission: RE | Admit: 2016-09-13 | Discharge: 2016-09-13 | Disposition: A | Discharge status: Home / Self Care | Payer: Medicare Other | Source: Ambulatory Visit | Attending: Physician Assistant | Admitting: Physician Assistant

## 2016-09-13 DIAGNOSIS — G4733 Obstructive sleep apnea (adult) (pediatric): Secondary | ICD-10-CM

## 2016-09-13 DIAGNOSIS — E669 Obesity, unspecified: Secondary | ICD-10-CM | POA: Diagnosis not present

## 2016-09-13 DIAGNOSIS — Z6839 Body mass index (BMI) 39.0-39.9, adult: Secondary | ICD-10-CM | POA: Insufficient documentation

## 2016-09-13 DIAGNOSIS — E118 Type 2 diabetes mellitus with unspecified complications: Secondary | ICD-10-CM | POA: Diagnosis not present

## 2016-09-13 DIAGNOSIS — J439 Emphysema, unspecified: Secondary | ICD-10-CM | POA: Insufficient documentation

## 2016-09-13 DIAGNOSIS — I10 Essential (primary) hypertension: Secondary | ICD-10-CM | POA: Insufficient documentation

## 2016-09-13 DIAGNOSIS — Z8249 Family history of ischemic heart disease and other diseases of the circulatory system: Secondary | ICD-10-CM | POA: Diagnosis not present

## 2016-09-13 DIAGNOSIS — R0609 Other forms of dyspnea: Secondary | ICD-10-CM | POA: Diagnosis not present

## 2016-09-13 DIAGNOSIS — Z87891 Personal history of nicotine dependence: Secondary | ICD-10-CM | POA: Insufficient documentation

## 2016-09-13 LAB — MYOCARDIAL PERFUSION IMAGING
CHL CUP RESTING HR STRESS: 61 {beats}/min
CSEPPHR: 81 {beats}/min
LV sys vol: 44 mL
LVDIAVOL: 118 mL (ref 46–106)
SDS: 1
SRS: 1
SSS: 2
TID: 1.28

## 2016-09-13 MED ORDER — TECHNETIUM TC 99M TETROFOSMIN IV KIT
30.6000 | PACK | Freq: Once | INTRAVENOUS | Status: AC | PRN
  Administered 2016-09-13: 31 via INTRAVENOUS
  Filled 2016-09-13: qty 31

## 2016-09-13 MED ORDER — AMINOPHYLLINE 25 MG/ML IV SOLN
75.0000 mg | Freq: Once | INTRAVENOUS | Status: AC
  Administered 2016-09-13: 75 mg via INTRAVENOUS

## 2016-09-13 MED ORDER — REGADENOSON 0.4 MG/5ML IV SOLN
0.4000 mg | Freq: Once | INTRAVENOUS | Status: AC
  Administered 2016-09-13: 0.4 mg via INTRAVENOUS

## 2016-09-13 MED ORDER — TECHNETIUM TC 99M TETROFOSMIN IV KIT
10.4000 | PACK | Freq: Once | INTRAVENOUS | Status: AC | PRN
  Administered 2016-09-13: 10 via INTRAVENOUS
  Filled 2016-09-13: qty 10

## 2016-09-13 NOTE — Assessment & Plan Note (Signed)
Controlled on CPAP   Plan  Patient Instructions  Continue on CPAP At bedtime   Keep up good work .  Stay active, work on weight loss.  Follow up with Dr. Elsworth Soho  In 1 year and As needed

## 2016-09-13 NOTE — Progress Notes (Signed)
Subjective:    Patient ID: [name], female    DOB: [birth year], 65 y.o.   MRN: [id number]  HPI 65 year old female with known obstructive sleep apnea  TEST  NSPG : AHI 51  CPAP titration good control on 12cm H2O   09/13/2016 Follow up :OSA  Patient returns for a three-month follow-up. Patient has known severe sleep apnea. Recently got new CPAP machine.  She underwent a C Pap titration study with good control on 12 cm of H2O. She says she is doing well on C Pap at bedtime. Denies any significant daytime sleepiness Download 8/15 through 09/11/2016 showed excellent compliance with average usage at 8 hours. She is on a set pressure of 12 cm of H2O. AHI 2.6. Minimum leaks. He denies any chest pain, orthopnea, PND, or increased leg swelling   Past Medical History:  Diagnosis Date  . Anemia   . Anxiety state, unspecified   . Arthritis    KNEES  . B12 deficiency anemia 05/2014 dx  . CIN I (cervical intraepithelial neoplasia I)   . Depressive disorder, not elsewhere classified   . Diabetes mellitus without complication ([place])   . Emphysema of lung ([place])   . Hypertension   . Internal hemorrhoids   . Other and unspecified hyperlipidemia   . Unspecified sleep apnea    Current Outpatient Prescriptions on File Prior to Visit  Medication Sig Dispense Refill  . ALPRAZolam (NIRAVAM) 0.5 MG dissolvable tablet Take 1 tablet (0.5 mg total) by mouth every 6 (six) hours as needed for anxiety (increased anxiety). 30 tablet 0  . aspirin 81 MG tablet Take 81 mg by mouth daily.      . cyanocobalamin (,VITAMIN B-12,) 1000 MCG/ML injection Inject 1,000 mcg into the muscle every 30 (thirty) days.    Marland Kitchen escitalopram (LEXAPRO) 10 MG tablet Take 1 tablet (10 mg total) by mouth daily. 90 tablet 3  . folic acid (FOLVITE) A999333 MCG tablet Take 400 mcg by mouth daily.    . furosemide (LASIX) 20 MG tablet TAKE 1 TABLET(20 MG) BY MOUTH DAILY 90 tablet 3  . irbesartan (AVAPRO) 300 MG tablet Take 1 tablet (300 mg  total) by mouth daily. 90 tablet 3  . metFORMIN (GLUCOPHAGE) 1000 MG tablet TAKE 1 TABLET(1000 MG) BY MOUTH TWICE DAILY WITH A MEAL 180 tablet 3  . simvastatin (ZOCOR) 40 MG tablet Take 1 tablet (40 mg total) by mouth at bedtime. 90 tablet 3   No current facility-administered medications on file prior to visit.       Review of Systems Constitutional:   No  weight loss, night sweats,  Fevers, chills, fatigue, or  lassitude.  HEENT:   No headaches,  Difficulty swallowing,  Tooth/dental problems, or  Sore throat,                No sneezing, itching, ear ache, nasal congestion, post nasal drip,   CV:  No chest pain,  Orthopnea, PND, swelling in lower extremities, anasarca, dizziness, palpitations, syncope.   GI  No heartburn, indigestion, abdominal pain, nausea, vomiting, diarrhea, change in bowel habits, loss of appetite, bloody stools.   Resp: No shortness of breath with exertion or at rest.  No excess mucus, no productive cough,  No non-productive cough,  No coughing up of blood.  No change in color of mucus.  No wheezing.  No chest wall deformity  Skin: no rash or lesions.  GU: no dysuria, change in color of urine, no urgency or frequency.  No flank pain, no hematuria   MS:  No joint pain or swelling.  No decreased range of motion.  No back pain.  Psych:  No change in mood or affect. No depression or anxiety.  No memory loss.         Objective:   Physical Exam Vitals:   09/13/16 1634  BP: 118/76  Pulse: 75  Temp: 97.9 F (36.6 C)  TempSrc: Oral  SpO2: 95%  Weight: 237 lb (107.5 kg)  Height: 5\' 5"  (1.651 m)  Body mass index is 39.44 kg/m.   GEN: A/Ox3; pleasant , NAD, obese    HEENT:  [place]/AT,  EACs-clear, TMs-wnl, NOSE-clear, THROAT-clear, no lesions, no postnasal drip or exudate noted. Class 2-3 MP airway   NECK:  Supple w/ fair ROM; no JVD; normal carotid impulses w/o bruits; no thyromegaly or nodules palpated; no lymphadenopathy.    RESP  Clear  P & A; w/o,  wheezes/ rales/ or rhonchi. no accessory muscle use, no dullness to percussion  CARD:  RRR, no m/r/g  , no peripheral edema, pulses intact, no cyanosis or clubbing.  GI:   Soft & nt; nml bowel sounds; no organomegaly or masses detected.   Musco: Warm bil, no deformities or joint swelling noted.   Neuro: alert, no focal deficits noted.    Skin: Warm, no lesions or rashes  Maliyah Willets NP-C  [facility]  09/13/2016       Assessment & Plan:

## 2016-09-13 NOTE — Patient Instructions (Signed)
Continue on CPAP At bedtime   Keep up good work .  Stay active, work on weight loss.  Follow up with Dr. Elsworth Soho  In 1 year and As needed

## 2016-09-14 NOTE — Progress Notes (Signed)
Reviewed & agree with plan  

## 2016-09-16 NOTE — Telephone Encounter (Signed)
Left message for patient to call office and schedule AEX/PAP. -sco

## 2016-09-19 NOTE — Progress Notes (Signed)
Cardiology Office Note   Date:  09/20/2016   ID:  Tom[place], Gannaway 11-21-1951, MRN [id number]  PCP:  Hoyt Koch, MD  Cardiologist:   Minus Breeding, MD   Chief Complaint  Patient presents with  . Shortness of Breath      History of Present Illness: AIMAN SCHLIES is a 65 y.o. female who presents for valuation of shortness of breath.  She has no past cardiac history. However, she's had increasing dyspnea. This has been happening chronically over time.  She does have sleep apnea and wears CPAP. She thought that her symptoms were related to deconditioning because she's been very inactive.  She did have an echocardiogram.  There was s no valvular disease listed. She has well preserved systolic function with some diastolic dysfunction noted. She had a POET (Plain Old Exercise Treadmill) which was inconclusive.   She had a perfusion study this month with no ischemia and an EF of 63%.  She returns for follow up.  She continues to have dyspnea. In this is unchanged from previous. She's not having any new symptoms such as PND or orthopnea. She's not had any new chest pressure, neck or arm discomfort.   Past Medical History:  Diagnosis Date  . Anemia   . Anxiety state, unspecified   . Arthritis    KNEES  . B12 deficiency anemia 05/2014 dx  . CIN I (cervical intraepithelial neoplasia I)   . Depressive disorder, not elsewhere classified   . Diabetes mellitus without complication ([place])   . Emphysema of lung ([place])   . Hypertension   . Internal hemorrhoids   . Other and unspecified hyperlipidemia   . Unspecified sleep apnea     Past Surgical History:  Procedure Laterality Date  . CESAREAN SECTION    . COLONOSCOPY    . HEMORRHOID SURGERY    . KNEE ARTHROSCOPY Left 12/2014  . UPPER GASTROINTESTINAL ENDOSCOPY       Current Outpatient Prescriptions  Medication Sig Dispense Refill  . ALPRAZolam (NIRAVAM) 0.5 MG dissolvable tablet Take 1 tablet (0.5 mg total) by mouth every 6  (six) hours as needed for anxiety (increased anxiety). 30 tablet 0  . aspirin 81 MG tablet Take 81 mg by mouth daily.      . cyanocobalamin (,VITAMIN B-12,) 1000 MCG/ML injection Inject 1,000 mcg into the muscle every 30 (thirty) days.    Marland Kitchen escitalopram (LEXAPRO) 10 MG tablet Take 1 tablet (10 mg total) by mouth daily. 90 tablet 3  . folic acid (FOLVITE) A999333 MCG tablet Take 400 mcg by mouth daily.    . furosemide (LASIX) 20 MG tablet TAKE 1 TABLET(20 MG) BY MOUTH DAILY 90 tablet 3  . irbesartan (AVAPRO) 300 MG tablet Take 1 tablet (300 mg total) by mouth daily. 90 tablet 3  . metFORMIN (GLUCOPHAGE) 1000 MG tablet TAKE 1 TABLET(1000 MG) BY MOUTH TWICE DAILY WITH A MEAL 180 tablet 3  . simvastatin (ZOCOR) 40 MG tablet Take 1 tablet (40 mg total) by mouth at bedtime. 90 tablet 3   No current facility-administered medications for this visit.     Allergies:   Review of patient's allergies indicates no known allergies.    ROS:  Please see the history of present illness.   Otherwise, review of systems are positive for none.   All other systems are reviewed and negative.    PHYSICAL EXAM: VS:  BP 140/76   Pulse 72   Ht 5\' 5"  (1.651 m)  Wt 237 lb (107.5 kg)   LMP 12/30/1998 (Approximate)   BMI 39.44 kg/m  , BMI Body mass index is 39.44 kg/m. GENERAL:  Well appearing NECK:  No jugular venous distention, waveform within normal limits, carotid upstroke brisk and symmetric, no bruits, no thyromegaly LUNGS:  Clear to auscultation bilaterally BACK:  No CVA tenderness CHEST:  Unremarkable HEART:  PMI not displaced or sustained,S1 and S2 within normal limits, no S3, no S4, no clicks, no rubs, no murmurs ABD:  Flat, positive bowel sounds normal in frequency in pitch, no bruits, no rebound, no guarding, no midline pulsatile mass, no hepatomegaly, no splenomegaly EXT:  2 plus pulses throughout, trace edema, no cyanosis no clubbing   EKG:  EKG is not ordered today.   Recent Labs: 02/27/2016:  Platelets 267.0 03/15/2016: Hemoglobin 11.9 06/20/2016: Brain Natriuretic Peptide 16.1 08/30/2016: ALT 12; BUN 14; Creatinine, Ser 0.66; Potassium 3.9; Sodium 140; TSH 2.20    Lipid Panel    Component Value Date/Time   CHOL 175 08/30/2016 1009   TRIG 152.0 (H) 08/30/2016 1009   HDL 43.20 08/30/2016 1009   CHOLHDL 4 08/30/2016 1009   VLDL 30.4 08/30/2016 1009   LDLCALC 101 (H) 08/30/2016 1009      Wt Readings from Last 3 Encounters:  09/20/16 237 lb (107.5 kg)  09/13/16 236 lb (107 kg)  09/13/16 237 lb (107.5 kg)      Other studies Reviewed: Additional studies/ records that were reviewed today include: none Review of the above records demonstrates:  Please see elsewhere in the note.     ASSESSMENT AND PLAN:  DYSPNEA:  She's had a negative cardiac workup. We talked at length about improving her lifestyle choices with weight loss and diet slowly increasing her activity. If she continues to have dyspnea in 6 months after mindfully paying attention to this and I will consider further workup such as cardiopulmonary stress testing.  DIASTOLIC DYSFUNCTION:  She does have some evidence of this and this may play a role as above. We talked about salt and fluid restriction and blood pressure management.  HTN:  The blood pressure is at target. No change in medications is indicated. We will continue with therapeutic lifestyle changes (TLC).  OBESITY:  The patient understands the need to lose weight with diet and exercise. We have discussed specific strategies for this.   Current medicines are reviewed at length with the patient today.  The patient does not have concerns regarding medicines.  The following changes have been made:  None  Labs/ tests ordered today include:   No orders of the defined types were placed in this encounter.    Disposition:   FU with me in 6 months.     Signed, Minus Breeding, MD  09/20/2016 3:53 PM    [address]

## 2016-09-19 NOTE — Telephone Encounter (Signed)
Patient is scheduled for 10-23-16 -eh

## 2016-09-20 ENCOUNTER — Encounter: Payer: Self-pay | Admitting: Cardiology

## 2016-09-20 ENCOUNTER — Ambulatory Visit (INDEPENDENT_AMBULATORY_CARE_PROVIDER_SITE_OTHER): Payer: Medicare Other | Admitting: Cardiology

## 2016-09-20 VITALS — BP 140/76 | HR 72 | Ht 65.0 in | Wt 237.0 lb

## 2016-09-20 DIAGNOSIS — R0609 Other forms of dyspnea: Secondary | ICD-10-CM

## 2016-09-20 NOTE — Patient Instructions (Signed)

## 2016-09-24 ENCOUNTER — Other Ambulatory Visit: Payer: Self-pay | Admitting: Internal Medicine

## 2016-09-24 DIAGNOSIS — Z1231 Encounter for screening mammogram for malignant neoplasm of breast: Secondary | ICD-10-CM

## 2016-09-30 ENCOUNTER — Ambulatory Visit: Payer: Medicare Other

## 2016-10-02 ENCOUNTER — Ambulatory Visit (INDEPENDENT_AMBULATORY_CARE_PROVIDER_SITE_OTHER): Payer: Medicare Other

## 2016-10-02 DIAGNOSIS — E538 Deficiency of other specified B group vitamins: Secondary | ICD-10-CM

## 2016-10-02 MED ORDER — CYANOCOBALAMIN 1000 MCG/ML IJ SOLN
1000.0000 ug | Freq: Once | INTRAMUSCULAR | Status: AC
  Administered 2016-10-02: 1000 ug via INTRAMUSCULAR

## 2016-10-03 NOTE — Progress Notes (Signed)
Medical treatment/procedure(s) were performed by non-physician practitioner and as supervising physician I was immediately available for consultation/collaboration. I agree with above. Tawonda Legaspi A Donie Moulton, MD  

## 2016-10-09 ENCOUNTER — Ambulatory Visit
Admission: RE | Admit: 2016-10-09 | Discharge: 2016-10-09 | Disposition: A | Discharge status: Home / Self Care | Payer: Medicare Other | Source: Ambulatory Visit | Attending: Internal Medicine | Admitting: Internal Medicine

## 2016-10-09 DIAGNOSIS — Z1231 Encounter for screening mammogram for malignant neoplasm of breast: Secondary | ICD-10-CM

## 2016-10-23 ENCOUNTER — Encounter: Payer: Self-pay | Admitting: Obstetrics and Gynecology

## 2016-10-23 ENCOUNTER — Ambulatory Visit (INDEPENDENT_AMBULATORY_CARE_PROVIDER_SITE_OTHER): Payer: Medicare Other | Admitting: Obstetrics and Gynecology

## 2016-10-23 VITALS — BP 140/82 | HR 70 | Resp 18 | Ht 65.0 in | Wt 236.6 lb

## 2016-10-23 DIAGNOSIS — Z01411 Encounter for gynecological examination (general) (routine) with abnormal findings: Secondary | ICD-10-CM

## 2016-10-23 DIAGNOSIS — N86 Erosion and ectropion of cervix uteri: Secondary | ICD-10-CM

## 2016-10-23 DIAGNOSIS — Z1151 Encounter for screening for human papillomavirus (HPV): Secondary | ICD-10-CM | POA: Diagnosis not present

## 2016-10-23 NOTE — Patient Instructions (Signed)

## 2016-10-23 NOTE — Progress Notes (Signed)
65 y.o. No obstetric history on file. Divorced Caucasian female here for annual exam.    Hx of cervical cerclage with twin gestation.   PCP:  Pricilla Holm, MD  Patient's last menstrual period was 12/30/1998 (approximate).           Sexually active: No.  The current method of family planning is post menopausal status.    Exercising: No.   Smoker:  Former  Health Maintenance: Pap:  08-28-15 Neg:Pos HR HPV History of abnormal Pap:  Yes. Pap 08/28/15 showed normal cells and positive HR HPV. Status post colposcopy - LGSIL 09/20/15.  Colposcopy unsatisfactory.   Inflammation of vagina noted after procedure on 09/27/15. Dr. Denman George consultation on 10/16/15 for cervix flush with vagina and unsatisfactory colpo. Pap and HR HPV recommended in one year due to concordance of pap and colpo bx.  Recommended hysterectomy only if needed more clear diagnosis or work up for HGSIL pap or biopsy.  LEEP and conization not recommended due to cervix flush with cervix. History of possible DES exposure noted at that visit.    MMG:  10-09-16 Density C/Neg/BiRads1:[facility] Colonoscopy:  12-02-14 benign polyp Dr. Ulyses Southward next 2020 or 2025. BMD:   02-27-16  Result:Osteopenia of bilateral hips, normal spine:[facility].   TDaP:  09-23-12 Gardasil:   N/A HIV: did life insurance physical.  Declines this today. Hep C: negative 02/27/16 Screening Labs:  Hb today: PCP, Urine today: ---   reports that she has quit smoking. Her smoking use included Cigarettes. She has a 7.00 pack-year smoking history. She quit smokeless tobacco use about 3 years ago. She reports that she drinks about 3.0 - 3.6 oz of alcohol per week . She reports that she does not use drugs.  Past Medical History:  Diagnosis Date  . Anemia   . Anxiety state, unspecified   . Arthritis    KNEES  . B12 deficiency anemia 05/2014 dx  . CIN I (cervical intraepithelial neoplasia I)   . Depressive disorder, not elsewhere classified   .  Diabetes mellitus without complication ([place])   . Emphysema of lung ([place])   . Hypertension   . Internal hemorrhoids   . Other and unspecified hyperlipidemia   . Unspecified sleep apnea     Past Surgical History:  Procedure Laterality Date  . CERVICAL CERCLAGE    . CESAREAN SECTION    . COLONOSCOPY    . HEMORRHOID SURGERY    . KNEE ARTHROSCOPY Left 12/2014  . UPPER GASTROINTESTINAL ENDOSCOPY      Current Outpatient Prescriptions  Medication Sig Dispense Refill  . ALPRAZolam (NIRAVAM) 0.5 MG dissolvable tablet Take 1 tablet (0.5 mg total) by mouth every 6 (six) hours as needed for anxiety (increased anxiety). 30 tablet 0  . aspirin 81 MG tablet Take 81 mg by mouth daily.      . cyanocobalamin (,VITAMIN B-12,) 1000 MCG/ML injection Inject 1,000 mcg into the muscle every 30 (thirty) days.    Marland Kitchen escitalopram (LEXAPRO) 10 MG tablet Take 1 tablet (10 mg total) by mouth daily. 90 tablet 3  . folic acid (FOLVITE) A999333 MCG tablet Take 400 mcg by mouth daily.    . furosemide (LASIX) 20 MG tablet TAKE 1 TABLET(20 MG) BY MOUTH DAILY 90 tablet 3  . irbesartan (AVAPRO) 300 MG tablet Take 1 tablet (300 mg total) by mouth daily. 90 tablet 3  . metFORMIN (GLUCOPHAGE) 1000 MG tablet TAKE 1 TABLET(1000 MG) BY MOUTH TWICE DAILY WITH A MEAL 180 tablet 3  .  simvastatin (ZOCOR) 40 MG tablet Take 1 tablet (40 mg total) by mouth at bedtime. 90 tablet 3   No current facility-administered medications for this visit.     Family History  Problem Relation Age of Onset  . Heart attack Father 82  . Diabetes Father   . Cancer Father     lung  . Heart disease Father     ROS:  Pertinent items are noted in HPI.  Otherwise, a comprehensive ROS was negative.  Exam:   BP 140/82 (BP Location: Right Arm, Patient Position: Sitting, Cuff Size: Large)   Pulse 70   Resp 18   Ht 5\' 5"  (1.651 m)   Wt 236 lb 9.6 oz (107.3 kg)   LMP 12/30/1998 (Approximate)   BMI 39.37 kg/m     General appearance: alert, cooperative  and appears stated age Head: Normocephalic, without obvious abnormality, atraumatic Neck: no adenopathy, supple, symmetrical, trachea midline and thyroid normal to inspection and palpation Lungs: clear to auscultation bilaterally Breasts: normal appearance, no masses or tenderness, No nipple retraction or dimpling, No nipple discharge or bleeding, No axillary or supraclavicular adenopathy Heart: regular rate and rhythm Abdomen: soft, non-tender; no masses, no organomegaly Extremities: extremities normal, atraumatic, no cyanosis or edema Skin: Skin color, texture, turgor normal. No rashes or lesions Lymph nodes: Cervical, supraclavicular, and axillary nodes normal. No abnormal inguinal nodes palpated Neurologic: Grossly normal  Pelvic: External genitalia:  no lesions              Urethra:  normal appearing urethra with no masses, tenderness or lesions              Bartholins and Skenes: normal                 Vagina: normal appearing vagina with normal color and discharge, no lesions              Cervix: ectropion noted.  Looks fleshy.   Consistent with cervical cerclage.  Feels like dilation of 1 cm and a short cervix.              Pap taken: Yes.   Bimanual Exam:  Uterus:  normal size, contour, position, consistency, mobility, non-tender              Adnexa: no mass, fullness, tenderness              Rectal exam: Yes.  .  Confirms.              Anus:  normal sphincter tone, no lesions  Chaperone was present for exam.  Assessment:   Well woman visit with normal exam. Hx LGSIL.  Hx cervical cerclage and possible DES exposure. Osteopenia.  Followed by PCP.  Plan: Yearly mammogram recommended after age 70.  Recommended self breast exam.  Pap and HR HPV as above. Discussed Calcium, Vitamin D, regular exercise program including cardiovascular and weight bearing exercise. We spent additional time discussing her LGSIL and colposcopy if this pap is abnormal or has positive HR  HPV. Follow up annually and prn.       After visit summary provided.

## 2016-10-28 LAB — IPS PAP TEST WITH HPV

## 2016-10-30 ENCOUNTER — Telehealth: Payer: Self-pay | Admitting: Obstetrics and Gynecology

## 2016-10-30 DIAGNOSIS — N889 Noninflammatory disorder of cervix uteri, unspecified: Secondary | ICD-10-CM

## 2016-10-30 NOTE — Telephone Encounter (Signed)
Thank you for the update.  Diagnosis will be cervical lesion.   You may close the encounter.  [place]

## 2016-10-30 NOTE — Telephone Encounter (Signed)
Dr. Quincy Simmonds, patient calling for pap results dated 10/23/16. Can you review and advise?

## 2016-10-30 NOTE — Telephone Encounter (Signed)
Patient calling for results.

## 2016-10-30 NOTE — Telephone Encounter (Signed)
Please refer to result note.  Pap and Hr HPV both negative.  I want to do a colposcopy due to the appearance of the cervix which seems that it may have changed from last year.

## 2016-10-30 NOTE — Telephone Encounter (Signed)
Spoke with patient. Advised of results and recommendations as seen below per Dr. Quincy Simmonds. Patient verbalizes understanding and is agreeable. Patient states she will need to return call to schedule colpo. Provided with contact info -ask for [place].    Cc: Meredith [name]  Notes Recorded by Nunzio Cobbs, MD on 10/30/2016 at 3:20 PM EDT Results to patient through My Chart. Please call her in follow up.  I want to do a colposcopy.  This will need precerting.   Hello [name],   Your pap is negative and the high risk HPV testing is negative.  I went back and reviewed your visit from last year.  I would like to do a colposcopy to view the cervix a little more closely because the overall appearance and feel of the cervix seems different from last year.   The nurse will call to assist with the process of communicating this to your insurance company and then scheduling.   Thank you,   Josefa Half, MD  Cc- Marisa Sprinkles

## 2016-10-30 NOTE — Telephone Encounter (Signed)
Patient returned call. Patient scheduled for colpo 11/27/16 at 3:00pm with Dr. Quincy Simmonds.  Advised patient to take Motrin 800 mg with food and water one hour before procedure. Patient verbalizes understanding and is agreeable to date and time.

## 2016-11-04 ENCOUNTER — Ambulatory Visit (INDEPENDENT_AMBULATORY_CARE_PROVIDER_SITE_OTHER): Payer: Medicare Other

## 2016-11-04 DIAGNOSIS — E538 Deficiency of other specified B group vitamins: Secondary | ICD-10-CM | POA: Diagnosis not present

## 2016-11-04 MED ORDER — CYANOCOBALAMIN 1000 MCG/ML IJ SOLN
1000.0000 ug | Freq: Once | INTRAMUSCULAR | Status: AC
  Administered 2017-02-17: 1000 ug via INTRAMUSCULAR

## 2016-11-04 NOTE — Progress Notes (Signed)
Medical treatment/procedure(s) were performed by non-physician practitioner and as supervising physician I was immediately available for consultation/collaboration. I agree with above. Girtha Kilgore A Treyson Axel, MD  

## 2016-11-26 ENCOUNTER — Encounter: Payer: Self-pay | Admitting: Obstetrics and Gynecology

## 2016-11-26 ENCOUNTER — Telehealth: Payer: Self-pay | Admitting: Obstetrics and Gynecology

## 2016-11-26 NOTE — Telephone Encounter (Signed)
Thank you for keeping this patient in our recall for colposcopy.

## 2016-11-26 NOTE — Telephone Encounter (Signed)
Left message to call Durell Lofaso at [phone].  

## 2016-11-26 NOTE — Telephone Encounter (Signed)
Patient called and cancelled her appointment for a colposcopy tomorrow, 11/27/16. She said, "I don't think I'd better come in. I'm really sick with a bad cold, cough, and a sore throat. I don't want to get you all sick too. Please tell Dr. Quincy Simmonds I am so sorry."  Routing to triage for rescheduling.  Cc: Dr. Quincy Simmonds for [facility]

## 2016-11-27 ENCOUNTER — Ambulatory Visit: Payer: Medicare Other | Admitting: Obstetrics and Gynecology

## 2016-11-28 NOTE — Telephone Encounter (Signed)
Spoke with patient. Patient states she is still sick with a cold and request a Wednesday appt. No available appt with Dr. Quincy Simmonds 12/6.  Patient rescheduled for colpo 12/11/16 at 3pm. Patient agreeable to date and time.  Routing to provider for final review. Patient is agreeable to disposition. Will close encounter.

## 2016-11-29 ENCOUNTER — Ambulatory Visit (INDEPENDENT_AMBULATORY_CARE_PROVIDER_SITE_OTHER): Payer: Medicare Other | Admitting: Physician Assistant

## 2016-11-29 VITALS — BP 124/68 | HR 86 | Temp 98.3°F | Resp 18 | Ht 65.0 in | Wt 231.0 lb

## 2016-11-29 DIAGNOSIS — R05 Cough: Secondary | ICD-10-CM

## 2016-11-29 DIAGNOSIS — J209 Acute bronchitis, unspecified: Secondary | ICD-10-CM | POA: Diagnosis not present

## 2016-11-29 DIAGNOSIS — R059 Cough, unspecified: Secondary | ICD-10-CM

## 2016-11-29 MED ORDER — HYDROCOD POLST-CPM POLST ER 10-8 MG/5ML PO SUER: 5.0000 mL | Freq: Two times a day (BID) | ORAL | 0 refills | Status: DC | PRN

## 2016-11-29 MED ORDER — AZITHROMYCIN 250 MG PO TABS: ORAL_TABLET | ORAL | 0 refills | Status: DC

## 2016-11-29 MED ORDER — BENZONATATE 100 MG PO CAPS: 100.0000 mg | ORAL_CAPSULE | Freq: Three times a day (TID) | ORAL | 0 refills | Status: DC | PRN

## 2016-11-29 MED ORDER — PREDNISONE 20 MG PO TABS: ORAL_TABLET | ORAL | 0 refills | Status: DC

## 2016-11-29 NOTE — Progress Notes (Signed)
Meredith [name]  MRN: [id number] DOB: 03-28-1951  PCP: Meredith Koch, MD  Subjective:  Pt is a 65 year old female, PMH HLD, Dm, HTN, OSA, obesity, depression, anxiety,  who presents to clinic for cough and shortness of breath x six days.   Dry hacking cough one week ago which has progressed to wet cough. Voice change and runny nose. Cough keeps her up at night. She complains of chest tightness and not being able to get a full deep breath.  Notes associated fever and chills.  Denies chest pain, palpitations, nausea, vomiting, abdominal pain, headache.  Has tried Tylenol, not helping. .  Former smoker. Quit 3 years ago.   Review of Systems  Constitutional: Positive for chills and fever. Negative for diaphoresis and fatigue.  HENT: Positive for rhinorrhea and voice change. Negative for congestion, postnasal drip, sinus pressure, sneezing and sore throat.   Respiratory: Positive for cough and shortness of breath. Negative for chest tightness and wheezing.   Cardiovascular: Negative for chest pain and palpitations.  Gastrointestinal: Negative for abdominal pain, diarrhea, nausea and vomiting.  Neurological: Negative for weakness, light-headedness and headaches.  Psychiatric/Behavioral: Positive for sleep disturbance.    Patient Active Problem List   Diagnosis Date Noted  . B12 deficiency anemia   . Other emphysema ([place]) 02/23/2014  . 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[place]) 12/31/2013  . Essential hypertension, benign 11/05/2013  . Routine health maintenance 09/27/2012  . Vitamin D deficiency 06/20/2011  . OBESITY, CLASS II 03/18/2011  . PULMONARY NODULE 07/12/2010  . Diabetes mellitus type 2, controlled, without complications ([place]) 99991111  . Hyperlipidemia 10/01/2007  . ANXIETY 10/01/2007  . Depression 10/01/2007  . OSA (obstructive sleep apnea) 10/01/2007    Current Outpatient Prescriptions on File Prior to Visit  Medication Sig Dispense Refill  . ALPRAZolam (NIRAVAM)  0.5 MG dissolvable tablet Take 1 tablet (0.5 mg total) by mouth every 6 (six) hours as needed for anxiety (increased anxiety). 30 tablet 0  . aspirin 81 MG tablet Take 81 mg by mouth daily.      . cyanocobalamin (,VITAMIN B-12,) 1000 MCG/ML injection Inject 1,000 mcg into the muscle every 30 (thirty) days.    Marland Kitchen escitalopram (LEXAPRO) 10 MG tablet Take 1 tablet (10 mg total) by mouth daily. 90 tablet 3  . folic acid (FOLVITE) A999333 MCG tablet Take 400 mcg by mouth daily.    . furosemide (LASIX) 20 MG tablet TAKE 1 TABLET(20 MG) BY MOUTH DAILY 90 tablet 3  . irbesartan (AVAPRO) 300 MG tablet Take 1 tablet (300 mg total) by mouth daily. 90 tablet 3  . metFORMIN (GLUCOPHAGE) 1000 MG tablet TAKE 1 TABLET(1000 MG) BY MOUTH TWICE DAILY WITH A MEAL 180 tablet 3  . simvastatin (ZOCOR) 40 MG tablet Take 1 tablet (40 mg total) by mouth at bedtime. 90 tablet 3   Current Facility-Administered Medications on File Prior to Visit  Medication Dose Route Frequency Provider Last Rate Last Dose  . cyanocobalamin ((VITAMIN B-12)) injection 1,000 mcg  1,000 mcg Intramuscular Once Meredith Koch, MD        No Known Allergies   Objective:  BP 124/68 (BP Location: Right Arm, Patient Position: Sitting, Cuff Size: Large)   Pulse 86   Temp 98.3 F (36.8 C) (Oral)   Resp 18   Ht 5\' 5"  (1.651 m)   Wt 231 lb (104.8 kg)   LMP 12/30/1998 (Approximate)   SpO2 94%   BMI 38.44 kg/m   Physical Exam  Constitutional: She  is oriented to person, place, and time and well-developed, well-nourished, and in no distress. No distress.  HENT:  Right Ear: Tympanic membrane normal.  Left Ear: Tympanic membrane normal.  Nose: Mucosal edema present.  Mouth/Throat: Posterior oropharyngeal edema present.  Cardiovascular: Normal rate, regular rhythm and normal heart sounds.   Pulmonary/Chest: She has wheezes in the right upper field and the left upper field. She has rhonchi in the right upper field and the left upper field.    Neurological: She is alert and oriented to person, place, and time. GCS score is 15.  Skin: Skin is warm and dry.  Psychiatric: Mood, memory, affect and judgment normal.  Vitals reviewed.   Assessment and Plan :  1. Cough 2. Acute bronchitis, unspecified organism - azithromycin (ZITHROMAX) 250 MG tablet; Take 2 tabs PO x 1 dose, then 1 tab PO QD x 4 days  Dispense: 6 tablet; Refill: 0 - chlorpheniramine-HYDROcodone (TUSSIONEX PENNKINETIC ER) 10-8 MG/5ML SUER; Take 5 mLs by mouth every 12 (twelve) hours as needed for cough.  Dispense: 100 mL; Refill: 0 - predniSONE (DELTASONE) 20 MG tablet; Take 3 PO QAM x3days, 2 PO QAM x3days, 1 PO QAM x3days  Dispense: 18 tablet; Refill: 0 - benzonatate (TESSALON) 100 MG capsule; Take 1-2 capsules (100-200 mg total) by mouth 3 (three) times daily as needed for cough.  Dispense: 40 capsule; Refill: 0 - Patient instructed to take steroid taper, then begin antibiotics afterwards. She is to stay well hydrated with plenty of clear fluids and get plenty of rest. RTC in 5-7 days if no improvement.    Mercer Pod, PA-C  [facility] 11/29/2016 9:03 AM

## 2016-11-29 NOTE — Patient Instructions (Addendum)
Please take your prednisone taper first. If you are not feeling better, take the Z-pack. Please stay well hydrated while you are getting better. Come back to the clinic if you are not better in 5-7 days.   Thank you for coming in today. I hope you feel we met your needs.  Feel free to call [facility] if you have any questions or further requests.  Please consider signing up for MyChart if you do not already have it, as this is a great way to communicate with me.  Best,  Whitney McVey, PA-C    IF you received an x-ray today, you will receive an invoice from [facility]. Please contact [facility] at [phone] with questions or concerns regarding your invoice.   IF you received labwork today, you will receive an invoice from [employer]. Please contact [facility] at [phone] with questions or concerns regarding your invoice.   Our billing staff will not be able to assist you with questions regarding bills from these companies.  You will be contacted with the lab results as soon as they are available. The fastest way to get your results is to activate your My Chart account. Instructions are located on the last page of this paperwork. If you have not heard from [place] regarding the results in 2 weeks, please contact this office.

## 2016-12-05 ENCOUNTER — Ambulatory Visit: Payer: Medicare Other

## 2016-12-10 ENCOUNTER — Encounter: Payer: Self-pay | Admitting: Obstetrics and Gynecology

## 2016-12-10 ENCOUNTER — Telehealth: Payer: Self-pay | Admitting: Obstetrics and Gynecology

## 2016-12-10 NOTE — Telephone Encounter (Signed)
Patient canceled her appointment tomorrow for colposcopy and would like to reschedule. Patient said she has had pneumonia for three weeks and does not feel that she can make this appointment. [facility] policy followed.

## 2016-12-10 NOTE — Telephone Encounter (Signed)
Left message to call Wynne Rozak at [phone].  

## 2016-12-11 ENCOUNTER — Ambulatory Visit: Payer: Self-pay | Admitting: Obstetrics and Gynecology

## 2016-12-12 NOTE — Telephone Encounter (Signed)
Left message to call Kursten Kruk at [phone].  

## 2016-12-17 NOTE — Telephone Encounter (Signed)
Spoke with patient. Patient states she will need to return call back, unable to schedule at this moment.

## 2016-12-17 NOTE — Telephone Encounter (Signed)
Patient is currently in recall until we get her colpo scheduled. We have extended this trying to follow-up with her. I have now added the recall for October 29, 2017.   Patient has canceled appointment (same day canceled) on 11-29- and 12-11-16 due to illness which is documented in EPIC. She has not returned calls to reschedule. Would you like letter mailed?

## 2016-12-17 NOTE — Telephone Encounter (Signed)
I am sending this phone message through to be sure we have the patient return for me to colpo her cervix.  She had a normal pap and negative HR HPV, but I thought I saw cervical lesions.  She really needs the microscopic exam and a probable biopsy.   She has a history of cervical dysplasia and possible DES exposure.   She is not in any recall that I am aware of.  By protocol, she should be in 08 for October 2018 but I want to do this colpo first.

## 2016-12-18 ENCOUNTER — Telehealth (INDEPENDENT_AMBULATORY_CARE_PROVIDER_SITE_OTHER): Payer: Self-pay

## 2016-12-18 ENCOUNTER — Ambulatory Visit (INDEPENDENT_AMBULATORY_CARE_PROVIDER_SITE_OTHER): Payer: Medicare Other

## 2016-12-18 ENCOUNTER — Encounter: Payer: Self-pay | Admitting: *Deleted

## 2016-12-18 DIAGNOSIS — E538 Deficiency of other specified B group vitamins: Secondary | ICD-10-CM

## 2016-12-18 DIAGNOSIS — Z23 Encounter for immunization: Secondary | ICD-10-CM | POA: Diagnosis not present

## 2016-12-18 MED ORDER — CYANOCOBALAMIN 1000 MCG/ML IJ SOLN
1000.0000 ug | Freq: Once | INTRAMUSCULAR | Status: AC
  Administered 2016-12-18: 1000 ug via INTRAMUSCULAR

## 2016-12-18 NOTE — Telephone Encounter (Signed)
I have signed the letter.

## 2016-12-18 NOTE — Telephone Encounter (Signed)
Pt called and states she injured her knee yesterday and would like to see if she can get a cortisone injection by Dr Lorin Mercy? Please call patient back at [phone]

## 2016-12-18 NOTE — Telephone Encounter (Signed)
I agree that she needs a letter that recommends follow up.

## 2016-12-18 NOTE — Telephone Encounter (Signed)
Letter to your office for review. 

## 2016-12-19 NOTE — Telephone Encounter (Signed)
appt 12/20/16 @ 8:45am Dr Lorin Mercy

## 2016-12-19 NOTE — Telephone Encounter (Signed)
Letter with recommendations regarding colposcopy mailed certified and regular [place] mail. Encounter closed.

## 2016-12-19 NOTE — Telephone Encounter (Signed)
Can you please call patient and see if she can come in the morning? Thanks.

## 2016-12-20 ENCOUNTER — Ambulatory Visit (INDEPENDENT_AMBULATORY_CARE_PROVIDER_SITE_OTHER): Payer: Medicare Other | Admitting: Orthopaedic Surgery

## 2016-12-20 VITALS — BP 136/73 | HR 74 | Temp 97.3°F

## 2016-12-20 DIAGNOSIS — M1712 Unilateral primary osteoarthritis, left knee: Secondary | ICD-10-CM | POA: Diagnosis not present

## 2016-12-20 MED ORDER — BUPIVACAINE HCL 0.25 % IJ SOLN
0.6600 mL | INTRAMUSCULAR | Status: AC | PRN
  Administered 2016-12-20: .66 mL via INTRA_ARTICULAR

## 2016-12-20 MED ORDER — METHYLPREDNISOLONE ACETATE 40 MG/ML IJ SUSP
40.0000 mg | INTRAMUSCULAR | Status: AC | PRN
  Administered 2016-12-20: 40 mg via INTRA_ARTICULAR

## 2016-12-20 MED ORDER — LIDOCAINE HCL 1 % IJ SOLN
1.0000 mL | INTRAMUSCULAR | Status: AC | PRN
  Administered 2016-12-20: 1 mL

## 2016-12-20 NOTE — Progress Notes (Signed)
Office Visit Note   Patient: Meredith [name]           Date of Birth: 1951/02/01           MRN: [id number] Visit Date: 12/20/2016              Requested by: Hoyt Koch, MD [place], [place] [id number] PCP: Hoyt Koch, MD   Assessment & Plan: Visit Diagnoses:  1. Unilateral primary osteoarthritis, left knee     Plan: Injection perform she tolerated the injection well. We'll recheck her in a month she'll have to watch her diet very carefully for the next week regular exercise bike now that the center knee injected. She'll watch closely for any evidence of hypoglycemia.  Follow-Up Instructions: Return in about 1 month (around 01/20/2017).   Orders:  Orders Placed This Encounter  Procedures  . Large Joint Injection/Arthrocentesis   No orders of the defined types were placed in this encounter.     Procedures: Large Joint Inj Date/Time: 12/20/2016 9:54 AM Performed by: Marybelle Killings Authorized by: Marybelle Killings   Consent Given by:  Patient Indications:  Pain and joint swelling Location:  Knee Site:  L knee Needle Size:  22 G Needle Length:  1.5 inches Approach:  Anterolateral Ultrasound Guidance: No   Fluoroscopic Guidance: No   Arthrogram: No   Medications:  1 mL lidocaine 1 %; 40 mg methylPREDNISolone acetate 40 MG/ML; 0.66 mL bupivacaine 0.25 % Aspiration Attempted: No   Patient tolerance:  Patient tolerated the procedure well with no immediate complications     Clinical Data: No additional findings.   Subjective: Chief Complaint  Patient presents with  . Left Knee - Pain    Meredith [name] is here today with left knee pain. States this Monday she was on her knees in her car grabbing something and felt awful pain. She states it is a pretty constant pain. Decrease in ROM. Its hard for her to get comfortable at night.  Patient's had catching in her knee she's had some difficulty walking. Left knee doesn't reach full extension and  she states her balance isn't really very good. She is concerned she may for fall.  Review of Systems  Constitutional: Negative for chills and diaphoresis.  HENT: Negative for ear discharge, ear pain and nosebleeds.   Eyes: Negative for discharge and visual disturbance.  Respiratory: Negative for cough, choking and shortness of breath.   Cardiovascular: Negative for chest pain and palpitations.  Gastrointestinal: Negative for abdominal distention and abdominal pain.  Endocrine: Negative for cold intolerance and heat intolerance.       Positive for diabetes on metformin. She states her last A1c was 7.1  Genitourinary: Negative for flank pain and hematuria.  Musculoskeletal:       Previous arthroscopy last year with medial lateral meniscectomies partial of the left knee. She had some areas of grade 3 and grade 4 chondromalacia.  Skin: Negative for rash and wound.  Neurological: Negative for seizures and speech difficulty.  Hematological: Negative for adenopathy. Does not bruise/bleed easily.  Psychiatric/Behavioral: Negative for agitation and suicidal ideas.     Objective: Vital Signs: BP 136/73 (BP Location: Right Arm, Patient Position: Sitting, Cuff Size: Large)   Pulse 74   Temp 97.3 F (36.3 C)   LMP 12/30/1998 (Approximate)   Physical Exam  Constitutional: She is oriented to person, place, and time. She appears well-developed.  HENT:  Head: Normocephalic.  Right Ear: External ear  normal.  Left Ear: External ear normal.  Eyes: Pupils are equal, round, and reactive to light.  Neck: No tracheal deviation present. No thyromegaly present.  Cardiovascular: Normal rate.   Pulmonary/Chest: Effort normal.  Abdominal: Soft.  Musculoskeletal:  Right knee reaches full extension negative straight leg raising 90. Left knee lacks 10 reaching full extension she is a mature with the bent knee gait. She was medial joint line tenderness there is 2+ knee effusion. Patellofemoral crepitus.  Collateral ligaments are stable. Anterior cruciate ligament PCL has normal distal pulses palpable. No rash or exposed skin negative [place].  No synovitis of the wrists fingers. Well-healed arthroscopic portals left knee from previous arthroscopy. Right knee reach full extension.  Neurological: She is alert and oriented to person, place, and time.  Skin: Skin is warm and dry.  Psychiatric: She has a normal mood and affect. Her behavior is normal.    Ortho Exam  Specialty Comments:  No specialty comments available.  Imaging: No results found.   PMFS History: Patient Active Problem List   Diagnosis Date Noted  . B12 deficiency anemia   . Other emphysema ([place]) 02/23/2014  . 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[place]) 12/31/2013  . Essential hypertension, benign 11/05/2013  . Routine health maintenance 09/27/2012  . Vitamin D deficiency 06/20/2011  . OBESITY, CLASS II 03/18/2011  . PULMONARY NODULE 07/12/2010  . Diabetes mellitus type 2, controlled, without complications ([place]) 99991111  . Hyperlipidemia 10/01/2007  . ANXIETY 10/01/2007  . Depression 10/01/2007  . OSA (obstructive sleep apnea) 10/01/2007   Past Medical History:  Diagnosis Date  . Anemia   . Anxiety state, unspecified   . Arthritis    KNEES  . B12 deficiency anemia 05/2014 dx  . CIN I (cervical intraepithelial neoplasia I)   . Depressive disorder, not elsewhere classified   . Diabetes mellitus without complication ([place])   . Emphysema of lung ([place])   . Hypertension   . Internal hemorrhoids   . Other and unspecified hyperlipidemia   . Unspecified sleep apnea     Family History  Problem Relation Age of Onset  . Heart attack Father 5  . Diabetes Father   . Cancer Father     lung  . Heart disease Father     Past Surgical History:  Procedure Laterality Date  . CERVICAL CERCLAGE    . CESAREAN SECTION    . COLONOSCOPY    . HEMORRHOID SURGERY    . KNEE ARTHROSCOPY Left 12/2014  . UPPER GASTROINTESTINAL  ENDOSCOPY     Social History   Occupational History  . 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 Smoking status: Former Smoker    Packs/day: 0.50    Years: 14.00    Types: Cigarettes  . Smokeless tobacco: Former Systems developer    Quit date: 10/02/2013  . Alcohol use 3.0 - 3.6 oz/week    5 - 6 Standard drinks or equivalent per week  . Drug use: No  . Sexual activity: No

## 2016-12-26 ENCOUNTER — Telehealth: Payer: Self-pay | Admitting: Obstetrics and Gynecology

## 2016-12-26 NOTE — Telephone Encounter (Signed)
Thank you for the update.  Cc- Lamont Snowball, Thayer Ohm.

## 2016-12-26 NOTE — Telephone Encounter (Signed)
Patient called in regards to a letter she received from our office, regarding cancelled appointments. Patient states she found "the tone of the letter to be offensive", she also states the letter references her cancellations being due to bronchitis. Patient states she wants to make sure it is noted "she did not have bronchitis, she was sick for 3 weeks with pneumonia".  "She was just too sick to come in."  Patient has rescheduled the appointment for a colposcopy on 01/06/17 with Dr Quincy Simmonds. I advised patient I will forward her concerns to her Dr Quincy Simmonds.  Routing to Dr Quincy Simmonds  cc: Lamont Snowball  cc: Thayer Ohm

## 2016-12-27 NOTE — Telephone Encounter (Signed)
Recall updated to 01-13-17 pending colpo appointment scheduled for 01-06-17.  Encounter closed.

## 2017-01-03 NOTE — Progress Notes (Signed)
Subjective:     Patient ID: Meredith [name], female   DOB: 10/28/1951, 66 y.o.   MRN: [id number]  HPI  Patient here today for colposcopy. Pap 10-23-16 Neg:Neg HR HPV but Dr. Quincy Simmonds wanted to perform colposcopy to view the cervix more closely because the overall appearance and feel of the cervix seemed different from last year.  Patient had pneumonia for 3 weeks which caused her to reschedule.  Pap history:  Pap 10-23-16 Neg:Neg HR HPV Pap 08/28/15 showed normal cells and positive HR HPV. Status post colposcopy - LGSIL 09/20/15. Colposcopy unsatisfactory.  Inflammation of vagina noted after procedure on 09/27/15. Dr. Denman George consultation on 10/16/15 for cervix flush with vagina and unsatisfactory colpo. Pap and HR HPV recommended in one year due to concordance of pap and colpo bx.  Recommended hysterectomy only if needed more clear diagnosis or work up for HGSIL pap or biopsy.  LEEP and conization not recommended due to cervix flush with cervix. History of possible DES exposure noted at that visit.    Review of Systems  LMP: Postmenopausal Contraception: Postmenopausal      Objective:   Physical Exam  Genitourinary:     Colposcopy  Consent for procedure.  Speculum placed in the vagina.  3% acetic acid used.  White light and green light filter used. Colposcopy not satisfactory.  Raised fleshy area on cervix at 12:00.  Measures 3 x 8 mm.  Area removed with biopsy forceps.  Friable and bleeds.  Monsel's placed.  Minimal EBL.  No complications.     Assessment:     Cervical lesion.  Normal pap and neg HR HPV. Hx prior LGSIL on colposcopy 2016.  Had positive HR HPV then. Hx potential DES exposure.    Plan:     Follow up biopsy results. I anticipate cotesting in November 2018 at annual exam, but this depends on the final pathology.  After visit summary to patient.

## 2017-01-06 ENCOUNTER — Ambulatory Visit (INDEPENDENT_AMBULATORY_CARE_PROVIDER_SITE_OTHER): Payer: Medicare Other | Admitting: Obstetrics and Gynecology

## 2017-01-06 ENCOUNTER — Encounter: Payer: Self-pay | Admitting: Obstetrics and Gynecology

## 2017-01-06 VITALS — BP 142/82 | HR 76 | Ht 65.0 in | Wt 213.2 lb

## 2017-01-06 DIAGNOSIS — N889 Noninflammatory disorder of cervix uteri, unspecified: Secondary | ICD-10-CM

## 2017-01-06 DIAGNOSIS — N72 Inflammatory disease of cervix uteri: Secondary | ICD-10-CM | POA: Diagnosis not present

## 2017-01-06 NOTE — Patient Instructions (Signed)

## 2017-01-08 LAB — IPS OTHER TISSUE BIOPSY

## 2017-01-10 ENCOUNTER — Telehealth: Payer: Self-pay | Admitting: Obstetrics and Gynecology

## 2017-01-10 NOTE — Telephone Encounter (Signed)
Patient returning your call but I did not see an open phone note.

## 2017-01-10 NOTE — Telephone Encounter (Signed)
See result note dated 01/10/17, will close encounter.

## 2017-01-20 ENCOUNTER — Ambulatory Visit (INDEPENDENT_AMBULATORY_CARE_PROVIDER_SITE_OTHER): Payer: Medicare Other | Admitting: Geriatric Medicine

## 2017-01-20 DIAGNOSIS — E538 Deficiency of other specified B group vitamins: Secondary | ICD-10-CM

## 2017-01-20 MED ORDER — CYANOCOBALAMIN 1000 MCG/ML IJ SOLN
1000.0000 ug | Freq: Once | INTRAMUSCULAR | Status: AC
  Administered 2017-01-20: 1000 ug via INTRAMUSCULAR

## 2017-01-20 NOTE — Progress Notes (Signed)
Medical treatment/procedure(s) were performed by non-physician practitioner and as supervising physician I was immediately available for consultation/collaboration. I agree with above. Charvez Voorhies A Linnette Panella, MD  

## 2017-01-22 ENCOUNTER — Ambulatory Visit (INDEPENDENT_AMBULATORY_CARE_PROVIDER_SITE_OTHER): Payer: Medicare Other | Admitting: Orthopaedic Surgery

## 2017-01-22 DIAGNOSIS — H2513 Age-related nuclear cataract, bilateral: Secondary | ICD-10-CM | POA: Diagnosis not present

## 2017-01-22 DIAGNOSIS — H25013 Cortical age-related cataract, bilateral: Secondary | ICD-10-CM | POA: Diagnosis not present

## 2017-01-22 DIAGNOSIS — H1851 Endothelial corneal dystrophy: Secondary | ICD-10-CM | POA: Diagnosis not present

## 2017-01-22 DIAGNOSIS — H35033 Hypertensive retinopathy, bilateral: Secondary | ICD-10-CM | POA: Diagnosis not present

## 2017-02-17 ENCOUNTER — Encounter: Payer: Self-pay | Admitting: Internal Medicine

## 2017-02-17 ENCOUNTER — Other Ambulatory Visit (INDEPENDENT_AMBULATORY_CARE_PROVIDER_SITE_OTHER): Payer: Medicare Other

## 2017-02-17 ENCOUNTER — Ambulatory Visit (INDEPENDENT_AMBULATORY_CARE_PROVIDER_SITE_OTHER): Payer: Medicare Other | Admitting: Internal Medicine

## 2017-02-17 VITALS — BP 160/100 | HR 71 | Temp 98.1°F | Ht 65.0 in | Wt 232.0 lb

## 2017-02-17 DIAGNOSIS — E119 Type 2 diabetes mellitus without complications: Secondary | ICD-10-CM

## 2017-02-17 DIAGNOSIS — E538 Deficiency of other specified B group vitamins: Secondary | ICD-10-CM

## 2017-02-17 DIAGNOSIS — I10 Essential (primary) hypertension: Secondary | ICD-10-CM

## 2017-02-17 DIAGNOSIS — F419 Anxiety disorder, unspecified: Secondary | ICD-10-CM

## 2017-02-17 DIAGNOSIS — R200 Anesthesia of skin: Secondary | ICD-10-CM

## 2017-02-17 LAB — TSH: TSH: 2.29 u[IU]/mL (ref 0.35–4.50)

## 2017-02-17 LAB — VITAMIN B12: Vitamin B-12: 354 pg/mL (ref 211–911)

## 2017-02-17 LAB — HEMOGLOBIN A1C: Hgb A1c MFr Bld: 8.2 % — ABNORMAL HIGH (ref 4.6–6.5)

## 2017-02-17 MED ORDER — ALPRAZOLAM 0.5 MG PO TBDP: 0.5000 mg | ORAL_TABLET | Freq: Every day | ORAL | 0 refills | Status: DC | PRN

## 2017-02-17 NOTE — Progress Notes (Signed)
   Subjective:    Patient ID: Meredith [name], female    DOB: 1951-09-24, 66 y.o.   MRN: [id number]  HPI The patient is a 66 YO female coming in for referral to neurology. She is having numbness at the tip of her 2nd finger on the right hand. There all the time the last several weeks, some pain at certain times. She denies change in diet or exercise. Does not get worse with certain activities or positions. No pain in the wrist. Still doing B12 replacement and no level in some time.   Review of Systems  Constitutional: Negative for activity change, appetite change, fatigue, fever and unexpected weight change.  Respiratory: Negative.   Cardiovascular: Negative.   Gastrointestinal: Negative.   Musculoskeletal: Negative.   Skin: Negative.   Neurological: Positive for numbness. Negative for dizziness, tremors, syncope, facial asymmetry, weakness and headaches.      Objective:   Physical Exam  Constitutional: She is oriented to person, place, and time. She appears well-developed and well-nourished.  HENT:  Head: Normocephalic and atraumatic.  Eyes: EOM are normal.  Neck: Normal range of motion.  Cardiovascular: Normal rate and regular rhythm.   Pulmonary/Chest: Effort normal and breath sounds normal. No respiratory distress. She has no wheezes.  Musculoskeletal: She exhibits no edema.  Neurological: She is alert and oriented to person, place, and time.  Numbness in the right hand, 2nd fingertip. Tinnel test negative.   Skin: Skin is warm and dry.   Vitals:   02/17/17 1452  BP: (!) 180/110  Pulse: 71  Temp: 98.1 F (36.7 C)  TempSrc: Oral  SpO2: 100%  Weight: 232 lb (105.2 kg)  Height: 5\' 5"  (1.651 m)      Assessment & Plan:  B12 shot given at visit.

## 2017-02-17 NOTE — Progress Notes (Signed)
Pre visit review using our clinic review tool, if applicable. No additional management support is needed unless otherwise documented below in the visit note. 

## 2017-02-17 NOTE — Patient Instructions (Signed)
We will send you to the neurologist and have given you the B12 shot today.

## 2017-02-20 ENCOUNTER — Ambulatory Visit: Payer: BLUE CROSS/BLUE SHIELD

## 2017-02-20 ENCOUNTER — Encounter: Payer: Self-pay | Admitting: Internal Medicine

## 2017-02-20 DIAGNOSIS — R200 Anesthesia of skin: Secondary | ICD-10-CM

## 2017-02-20 DIAGNOSIS — R202 Paresthesia of skin: Principal | ICD-10-CM

## 2017-02-20 NOTE — Assessment & Plan Note (Signed)
She has not taken meds today and will need to follow BP as it is elevated today on her irbesartan.

## 2017-02-20 NOTE — Assessment & Plan Note (Signed)
Referral to neurology, checking B12, TSH, HgA1c to check for reversible cause. Given B12 shot after labs drawn.

## 2017-02-25 ENCOUNTER — Ambulatory Visit (INDEPENDENT_AMBULATORY_CARE_PROVIDER_SITE_OTHER): Payer: Medicare Other | Admitting: Physician Assistant

## 2017-02-25 ENCOUNTER — Encounter: Payer: Self-pay | Admitting: Physician Assistant

## 2017-02-25 VITALS — BP 154/76 | HR 85 | Ht 65.0 in | Wt 237.8 lb

## 2017-02-25 DIAGNOSIS — R0609 Other forms of dyspnea: Secondary | ICD-10-CM

## 2017-02-25 DIAGNOSIS — I10 Essential (primary) hypertension: Secondary | ICD-10-CM

## 2017-02-25 MED ORDER — AMLODIPINE BESYLATE 2.5 MG PO TABS: 2.5000 mg | ORAL_TABLET | Freq: Every day | ORAL | 6 refills | Status: DC

## 2017-02-25 NOTE — Progress Notes (Signed)
Cardiology Office Note   Date:  02/27/2017   ID:  Meredith [name], Meredith [name] 1951/11/10, MRN [id number]  PCP:  Meredith Koch, MD  Cardiologist:  Dr. Percival [name] 09/10/2016  Meredith Ferries, PA-C   Chief Complaint  Patient presents with  . Chest Pain    Pt c/o occasional chest pain  . Dizziness    When turning too fast, or making any fast movement  . Shortness of Breath    Pt. c/o of SOB occasionally     History of Present Illness: Meredith [name] is a 66 y.o. female with a history of DOE (POET inconclusive>>MV nl), B12 def anemia, DM, HTN, HLD, OSA, depression/anxiety  Meredith [name] presents for 6 month follow up.  She has been doing fairly well.   She had some upper sternal pain the other day. She has a hx anxiety and found out last week that her daughter is having twins. She had been panicky at times due to family issues, and had an episode of chest pain the other day. She laid down and rested and felt better. She only gets the chest pain when under stress, this is mainly from her children's issues. She does not get CP with physical exertion and does not get it at work.   She saw her PCP during all of this and her BP was 180/110. Since then, it has been elevated more than usual. She has Not been checking regularly at home.  She has lower extremity edema on a regular basis. She also has some dyspnea on exertion.   Past Medical History:  Diagnosis Date  . Anemia   . Anxiety state, unspecified   . Arthritis    KNEES  . B12 deficiency anemia 05/2014 dx  . CIN I (cervical intraepithelial neoplasia I)   . Depressive disorder, not elsewhere classified   . Diabetes mellitus without complication ([place])   . Emphysema of lung ([place])   . Hypertension   . Internal hemorrhoids   . Other and unspecified hyperlipidemia   . Unspecified sleep apnea     Past Surgical History:  Procedure Laterality Date  . CERVICAL CERCLAGE    . CESAREAN SECTION    . COLONOSCOPY    . HEMORRHOID SURGERY      . KNEE ARTHROSCOPY Left 12/2014  . UPPER GASTROINTESTINAL ENDOSCOPY      Medication Sig  . ALPRAZolam (NIRAVAM) 0.5 MG dissolvable tablet Take 1 tablet (0.5 mg total) by mouth daily as needed for anxiety (increased anxiety).  Marland Kitchen aspirin 81 MG tablet Take 81 mg by mouth daily.    . chlorpheniramine-HYDROcodone (TUSSIONEX PENNKINETIC ER) 10-8 MG/5ML SUER Take 5 mLs by mouth every 12 (twelve) hours as needed for cough.  . cyanocobalamin (,VITAMIN B-12,) 1000 MCG/ML injection Inject 1,000 mcg into the muscle every 30 (thirty) days.  Marland Kitchen escitalopram (LEXAPRO) 10 MG tablet Take 1 tablet (10 mg total) by mouth daily.  . folic acid (FOLVITE) A999333 MCG tablet Take 400 mcg by mouth daily.  . furosemide (LASIX) 20 MG tablet TAKE 1 TABLET(20 MG) BY MOUTH DAILY  . irbesartan (AVAPRO) 300 MG tablet Take 1 tablet (300 mg total) by mouth daily.  . metFORMIN (GLUCOPHAGE) 1000 MG tablet TAKE 1 TABLET(1000 MG) BY MOUTH TWICE DAILY WITH A MEAL  . simvastatin (ZOCOR) 40 MG tablet Take 1 tablet (40 mg total) by mouth at bedtime.   No current facility-administered medications for this visit.     Allergies:   Patient has no  known allergies.    Social History:  The patient  reports that she has quit smoking. Her smoking use included Cigarettes. She has a 7.00 pack-year smoking history. She quit smokeless tobacco use about 3 years ago. She reports that she drinks about 3.0 - 3.6 oz of alcohol per week . She reports that she does not use drugs.   Family History:  The patient's family history includes Cancer in her father; Diabetes in her father; Heart attack (age of onset: 51) in her father; Heart disease in her father.    ROS:  Please see the history of present illness. All other systems are reviewed and negative.    PHYSICAL EXAM: VS:  BP (!) 154/76   Pulse 85   Ht 5\' 5"  (1.651 m)   Wt 237 lb 12.8 oz (107.9 kg)   LMP 12/30/1998 (Approximate)   SpO2 96%   BMI 39.57 kg/m  , BMI Body mass index is 39.57  kg/m. GEN: Well nourished, well developed, female in no acute distress  HEENT: normal for age  Neck: Minimal JVD, no carotid bruit, no masses Cardiac: RRR; no murmur, no rubs, or gallops Respiratory:  clear to auscultation bilaterally, normal work of breathing GI: soft, nontender, nondistended, + BS MS: no deformity or atrophy; trace edema; distal pulses are 2+ in all 4 extremities   Skin: warm and dry, no rash Neuro:  Strength and sensation are intact Psych: euthymic mood, full affect   EKG:  EKG is not ordered today.  MYOVIEW: 09/13/2016  The left ventricular ejection fraction is normal (55-65%).  Nuclear stress EF: 63%. No wall motion abnormalities  There was no ST segment deviation noted during stress.  This is a low risk study. No perfusion defects, no ischemia identified   Recent Labs: 03/15/2016: Hemoglobin 11.9 06/20/2016: Brain Natriuretic Peptide 16.1 08/30/2016: ALT 12; BUN 14; Creatinine, Ser 0.66; Potassium 3.9; Sodium 140 02/17/2017: TSH 2.29    Lipid Panel    Component Value Date/Time   CHOL 175 08/30/2016 1009   TRIG 152.0 (H) 08/30/2016 1009   HDL 43.20 08/30/2016 1009   CHOLHDL 4 08/30/2016 1009   VLDL 30.4 08/30/2016 1009   LDLCALC 101 (H) 08/30/2016 1009     Wt Readings from Last 3 Encounters:  02/25/17 237 lb 12.8 oz (107.9 kg)  02/17/17 232 lb (105.2 kg)  01/06/17 231 lb 3.2 oz (96.7 kg)     Other studies Reviewed: Additional studies/ records that were reviewed today include: Office notes and testing.  ASSESSMENT AND PLAN:  1.  Precordial chest pain: Her symptoms were atypical and in the setting of emotional stress. She had a normal Myoview within the last year and has no history of exertional symptoms. I reassured her that I had no evidence that her symptoms were cardiac. She is encouraged to manage her anxiety and if she has chest pain when she is not feeling anxious to contact us.  2. Hypertension: Her blood pressure was very elevated when  she saw her PCP and it is elevated here today. Additionally, she describes some dyspnea on exertion. She has a long history of tobacco use and has emphysema so this is not clearly CHF. She has minimal volume overload on exam.  To help control her blood pressure and her volume, will increase her Lasix a couple of times a week. Additionally, I will start amlodipine 2.5 mg daily which can be up titrated as needed for blood pressure control.   Current medicines are reviewed at length  with the patient today.  The patient does not have concerns regarding medicines.  The following changes have been made:  Increase Lasix and add amlodipine  Labs/ tests ordered today include:  No orders of the defined types were placed in this encounter.    Disposition:   FU with Dr. Percival [name]  Signed, Lenoard Aden  02/27/2017 5:57 PM    [place] Phone: [phone]; Fax: [phone]  This note was written with the assistance of speech recognition software. Please excuse any transcriptional errors.

## 2017-02-25 NOTE — Patient Instructions (Signed)
Medication Instructions:  Start new amlodipine 2.5 mg prescription. This has been sent to your pharmacy.  Take a extra furosemide 20 mg 3 times a week.   Labwork: NONE however make sure that your primary care doctor checks your lipids.  Testing/Procedures:  NONE  Follow-Up:  Your physician wants you to follow-up in: 6 months or sooner if needed. You will receive a reminder letter in the mail two months in advance. If you don't receive a letter, please call our office to schedule the follow-up appointment.   Any Other Special Instructions Will Be Listed Below (If Applicable).

## 2017-03-17 ENCOUNTER — Telehealth: Payer: Self-pay

## 2017-03-17 ENCOUNTER — Ambulatory Visit (INDEPENDENT_AMBULATORY_CARE_PROVIDER_SITE_OTHER): Payer: Medicare Other

## 2017-03-17 DIAGNOSIS — E538 Deficiency of other specified B group vitamins: Secondary | ICD-10-CM | POA: Diagnosis not present

## 2017-03-17 MED ORDER — CYANOCOBALAMIN 1000 MCG/ML IJ SOLN
1000.0000 ug | Freq: Once | INTRAMUSCULAR | Status: AC
  Administered 2017-03-17: 1000 ug via INTRAMUSCULAR

## 2017-03-17 NOTE — Telephone Encounter (Signed)
Patient is scheduled for nurse visit today (b12 injection)---last  b12 lab on 02/17/17 was 398---low end of normal, are you ok with her continuing injections until next lab is done---please advise, thanks

## 2017-03-17 NOTE — Progress Notes (Signed)
Medical treatment/procedure(s) were performed by non-physician practitioner and as supervising physician I was immediately available for consultation/collaboration. I agree with above. Jaileen Janelle A Ahmod Gillespie, MD  

## 2017-03-17 NOTE — Telephone Encounter (Signed)
Yes, that's fine 

## 2017-03-27 ENCOUNTER — Encounter: Payer: Self-pay | Admitting: Internal Medicine

## 2017-04-17 ENCOUNTER — Ambulatory Visit (INDEPENDENT_AMBULATORY_CARE_PROVIDER_SITE_OTHER): Payer: Medicare Other | Admitting: General Practice

## 2017-04-17 DIAGNOSIS — E538 Deficiency of other specified B group vitamins: Secondary | ICD-10-CM | POA: Diagnosis not present

## 2017-04-17 MED ORDER — CYANOCOBALAMIN 1000 MCG/ML IJ SOLN
1000.0000 ug | Freq: Once | INTRAMUSCULAR | Status: AC
  Administered 2017-04-17: 1000 ug via INTRAMUSCULAR

## 2017-04-17 NOTE — Progress Notes (Signed)
Medical screening examination/treatment/procedure(s) were performed by non-physician practitioner and as supervising physician I was immediately available for consultation/collaboration. I agree with above. Elizabeth A Crawford, MD 

## 2017-04-21 DIAGNOSIS — M1811 Unilateral primary osteoarthritis of first carpometacarpal joint, right hand: Secondary | ICD-10-CM | POA: Diagnosis not present

## 2017-04-21 DIAGNOSIS — R200 Anesthesia of skin: Secondary | ICD-10-CM | POA: Diagnosis not present

## 2017-04-21 DIAGNOSIS — G5601 Carpal tunnel syndrome, right upper limb: Secondary | ICD-10-CM | POA: Diagnosis not present

## 2017-05-14 ENCOUNTER — Encounter: Payer: Self-pay | Admitting: Obstetrics and Gynecology

## 2017-05-14 ENCOUNTER — Ambulatory Visit (INDEPENDENT_AMBULATORY_CARE_PROVIDER_SITE_OTHER): Payer: Medicare Other | Admitting: Obstetrics and Gynecology

## 2017-05-14 VITALS — BP 116/60 | HR 64 | Resp 16 | Ht 65.0 in | Wt 234.0 lb

## 2017-05-14 DIAGNOSIS — Z1151 Encounter for screening for human papillomavirus (HPV): Secondary | ICD-10-CM | POA: Diagnosis not present

## 2017-05-14 DIAGNOSIS — N879 Dysplasia of cervix uteri, unspecified: Secondary | ICD-10-CM | POA: Diagnosis not present

## 2017-05-14 NOTE — Progress Notes (Signed)
GYNECOLOGY  VISIT   HPI: 66 y.o.   Divorced  Caucasian  female   No obstetric history on file. with Patient's last menstrual period was 12/30/1998 (approximate).   here for repeat pap.    Colposcopy in January showed granulation tissue with atypia.  This was attributed to inflammation but could also be due to LGSIL. Hx of possible DES exposure.  Had consultation with Dr. Denman George.  GYNECOLOGIC HISTORY: Patient's last menstrual period was 12/30/1998 (approximate). Contraception:  Postmenopausal Menopausal hormone therapy:  none Last mammogram:  10-09-16 Density C/Neg/BiRads[facility] Last pap smear: 10-23-16 Neg:Neg HR HPV--colpo done due to abnormal appearance of cervix-- cx bx revealing granulation tissue, but could be due to low grade dysplasia--needs pap and HR HPV in 4 months--(Hx of abnormal pap, see not from appt. 10-23-16)        OB History    No data available         Patient Active Problem List   Diagnosis Date Noted  . Numbness in both hands 02/20/2017  . B12 deficiency anemia   . Other emphysema ([place]) 02/23/2014  . 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[place]) 12/31/2013  . Essential hypertension, benign 11/05/2013  . Routine health maintenance 09/27/2012  . Vitamin D deficiency 06/20/2011  . OBESITY, CLASS II 03/18/2011  . PULMONARY NODULE 07/12/2010  . Diabetes mellitus type 2, controlled, without complications ([place]) 40/98/1191  . Hyperlipidemia 10/01/2007  . ANXIETY 10/01/2007  . Depression 10/01/2007  . OSA (obstructive sleep apnea) 10/01/2007    Past Medical History:  Diagnosis Date  . Anemia   . Anxiety state, unspecified   . Arthritis    KNEES  . B12 deficiency anemia 05/2014 dx  . CIN I (cervical intraepithelial neoplasia I)   . Depressive disorder, not elsewhere classified   . Diabetes mellitus without complication ([place])   . Emphysema of lung ([place])   . Hypertension   . Internal hemorrhoids   . Other and unspecified hyperlipidemia   .  Unspecified sleep apnea     Past Surgical History:  Procedure Laterality Date  . CERVICAL CERCLAGE    . CESAREAN SECTION    . COLONOSCOPY    . HEMORRHOID SURGERY    . KNEE ARTHROSCOPY Left 12/2014  . UPPER GASTROINTESTINAL ENDOSCOPY      Current Outpatient Prescriptions  Medication Sig Dispense Refill  . ALPRAZolam (NIRAVAM) 0.5 MG dissolvable tablet Take 1 tablet (0.5 mg total) by mouth daily as needed for anxiety (increased anxiety). 30 tablet 0  . amLODipine (NORVASC) 2.5 MG tablet Take 1 tablet (2.5 mg total) by mouth daily. 30 tablet 6  . aspirin 81 MG tablet Take 81 mg by mouth daily.      . chlorpheniramine-HYDROcodone (TUSSIONEX PENNKINETIC ER) 10-8 MG/5ML SUER Take 5 mLs by mouth every 12 (twelve) hours as needed for cough. 100 mL 0  . cyanocobalamin (,VITAMIN B-12,) 1000 MCG/ML injection Inject 1,000 mcg into the muscle every 30 (thirty) days.    Marland Kitchen escitalopram (LEXAPRO) 10 MG tablet Take 1 tablet (10 mg total) by mouth daily. 90 tablet 3  . folic acid (FOLVITE) 478 MCG tablet Take 400 mcg by mouth daily.    . furosemide (LASIX) 20 MG tablet TAKE 1 TABLET(20 MG) BY MOUTH DAILY 90 tablet 3  . irbesartan (AVAPRO) 300 MG tablet Take 1 tablet (300 mg total) by mouth daily. 90 tablet 3  . metFORMIN (GLUCOPHAGE) 1000 MG tablet TAKE 1 TABLET(1000 MG) BY MOUTH TWICE DAILY WITH A MEAL 180 tablet 3  .  simvastatin (ZOCOR) 40 MG tablet Take 1 tablet (40 mg total) by mouth at bedtime. 90 tablet 3   No current facility-administered medications for this visit.      ALLERGIES: Patient has no known allergies.  Family History  Problem Relation Age of Onset  . Heart attack Father 60  . Diabetes Father   . Cancer Father        lung  . Heart disease Father     Social History   Social History  . Marital status: Divorced    Spouse name: N/A  . Number of children: 3  . Years of education: 14   Occupational History  . Counselor/therapist [employer]   Social  History Main Topics  . Smoking status: Former Smoker    Packs/day: 0.50    Years: 14.00    Types: Cigarettes  . Smokeless tobacco: Former Systems developer    Quit date: 10/02/2013  . Alcohol use 3.0 - 3.6 oz/week    5 - 6 Standard drinks or equivalent per week  . Drug use: No  . Sexual activity: No   Other Topics Concern  . Not on file   Social History Narrative   2 year training [place] in substance abuse counseling. BA- psych from [employer]. Married - 14 years -divorced. 3 daughters - twins 71 - one dtr,Charlotte, died 04-24-2012 unknown causes, 40   Work: Biomedical engineer substance abuse Social worker. No h/o physical or sexual abuse    ROS:  Pertinent items are noted in HPI.  PHYSICAL EXAMINATION:    BP 116/60 (BP Location: Right Arm, Patient Position: Sitting, Cuff Size: Normal)   Pulse 64   Resp 16   Ht 5\' 5"  (1.651 m)   Wt 234 lb (106.1 kg)   LMP 12/30/1998 (Approximate)   BMI 38.94 kg/m     General appearance: alert, cooperative and appears stated age    Pelvic: External genitalia:  no lesions              Urethra:  normal appearing urethra with no masses, tenderness or lesions              Bartholins and Skenes: normal                 Vagina: normal appearing vagina with normal color and discharge, no lesions.  No granulation tissue.              Cervix: no lesions. Flush with vagina.  Difficult to see os.                 Bimanual Exam:  Uterus:  normal size, contour, position, consistency, mobility, non-tender              Adnexa: no mass, fullness, tenderness        Chaperone was present for exam.  ASSESSMENT  Cervical lesion.  Atypia on colpo biopsy in [place]. 2018. Normal pap and neg HR HPV that preceeded this.  Hx prior LGSIL on colposcopy 04-25-2015.  Had positive HR HPV then. Hx potential DES exposure.  Cervix flush with vagina.  PLAN  Discussion of granulation tissue, cervical inflammation, dysplasia and DES exposure.  Follow up cotesting.  Hysterectomy for  high grade disease.   At a minimum, follow with at annual exam with cotesting in November.   An After Visit Summary was printed and given to the patient.  __15____ minutes face to face time of which over 50% was spent in counseling.

## 2017-05-16 LAB — IPS PAP TEST WITH HPV

## 2017-05-20 ENCOUNTER — Ambulatory Visit: Payer: BLUE CROSS/BLUE SHIELD

## 2017-05-22 ENCOUNTER — Ambulatory Visit (INDEPENDENT_AMBULATORY_CARE_PROVIDER_SITE_OTHER): Payer: Medicare Other | Admitting: General Practice

## 2017-05-22 DIAGNOSIS — E538 Deficiency of other specified B group vitamins: Secondary | ICD-10-CM | POA: Diagnosis not present

## 2017-05-22 MED ORDER — CYANOCOBALAMIN 1000 MCG/ML IJ SOLN
1000.0000 ug | Freq: Once | INTRAMUSCULAR | Status: AC
  Administered 2017-05-22: 1000 ug via INTRAMUSCULAR

## 2017-05-22 NOTE — Progress Notes (Signed)
Medical treatment/procedure(s) were performed by non-physician practitioner and as supervising physician I was immediately available for consultation/collaboration. I agree with above. Lakia Gritton A Suriya Kovarik, MD  

## 2017-06-23 ENCOUNTER — Ambulatory Visit (INDEPENDENT_AMBULATORY_CARE_PROVIDER_SITE_OTHER): Payer: Medicare Other

## 2017-06-23 DIAGNOSIS — E538 Deficiency of other specified B group vitamins: Secondary | ICD-10-CM | POA: Diagnosis not present

## 2017-06-23 MED ORDER — CYANOCOBALAMIN 1000 MCG/ML IJ SOLN
1000.0000 ug | Freq: Once | INTRAMUSCULAR | Status: AC
  Administered 2017-06-23: 1000 ug via INTRAMUSCULAR

## 2017-07-17 ENCOUNTER — Encounter: Payer: Self-pay | Admitting: Obstetrics and Gynecology

## 2017-07-17 ENCOUNTER — Ambulatory Visit (INDEPENDENT_AMBULATORY_CARE_PROVIDER_SITE_OTHER): Payer: Medicare Other | Admitting: Obstetrics and Gynecology

## 2017-07-17 VITALS — BP 124/64 | HR 80 | Resp 14 | Ht 65.0 in | Wt 233.4 lb

## 2017-07-17 DIAGNOSIS — R8290 Unspecified abnormal findings in urine: Secondary | ICD-10-CM | POA: Diagnosis not present

## 2017-07-17 DIAGNOSIS — R309 Painful micturition, unspecified: Secondary | ICD-10-CM | POA: Diagnosis not present

## 2017-07-17 LAB — POCT URINALYSIS DIPSTICK
BILIRUBIN UA: NEGATIVE
GLUCOSE UA: NEGATIVE
KETONES UA: NEGATIVE
Nitrite, UA: NEGATIVE
Protein, UA: NEGATIVE
UROBILINOGEN UA: 0.2 U/dL
pH, UA: 5 (ref 5.0–8.0)

## 2017-07-17 MED ORDER — NITROFURANTOIN MONOHYD MACRO 100 MG PO CAPS: 100.0000 mg | ORAL_CAPSULE | Freq: Two times a day (BID) | ORAL | 0 refills | Status: DC

## 2017-07-17 NOTE — Progress Notes (Signed)
GYNECOLOGY  VISIT   HPI: 66 y.o.   Divorced  Caucasian  female   [phone] with Patient's last menstrual period was 12/30/1998 (approximate).   here for   Dysuria.  Cloudy and odorous urine.  No fever, back pain, nausea or vomiting.  Some lower abdominal pain that is intermittent.   Tried AZO.   Last UTI was about 5 years.  Prefers Macrobid.   GYNECOLOGIC HISTORY: Patient's last menstrual period was 12/30/1998 (approximate). Contraception: NA Menopausal hormone therapy:  NA Last mammogram:   10/09/16 - BI-RADS1 Last pap smear:  05/14/17 - neg, neg HR HPV.  Due for pap in Nov 2018 due to atypia hx.        OB History    Gravida Para Term Preterm AB Living   3 3 3  0 0 4   SAB TAB Ectopic Multiple Live Births   0 0 0 1 0         Patient Active Problem List   Diagnosis Date Noted  . Numbness in both hands 02/20/2017  . B12 deficiency anemia   . Other emphysema ([place]) 02/23/2014  . 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[place]) 12/31/2013  . Essential hypertension, benign 11/05/2013  . Routine health maintenance 09/27/2012  . Vitamin D deficiency 06/20/2011  . OBESITY, CLASS II 03/18/2011  . PULMONARY NODULE 07/12/2010  . Diabetes mellitus type 2, controlled, without complications ([place]) 93/26/7124  . Hyperlipidemia 10/01/2007  . ANXIETY 10/01/2007  . Depression 10/01/2007  . OSA (obstructive sleep apnea) 10/01/2007    Past Medical History:  Diagnosis Date  . Anemia   . Anxiety state, unspecified   . Arthritis    KNEES  . B12 deficiency anemia 05/2014 dx  . CIN I (cervical intraepithelial neoplasia I)   . Depressive disorder, not elsewhere classified   . Diabetes mellitus without complication ([place])   . Emphysema of lung ([place])   . Hypertension   . Internal hemorrhoids   . Other and unspecified hyperlipidemia   . Unspecified sleep apnea     Past Surgical History:  Procedure Laterality Date  . CERVICAL CERCLAGE    . CESAREAN SECTION    . COLONOSCOPY    . HEMORRHOID  SURGERY    . KNEE ARTHROSCOPY Left 12/2014  . UPPER GASTROINTESTINAL ENDOSCOPY      Current Outpatient Prescriptions  Medication Sig Dispense Refill  . ALPRAZolam (NIRAVAM) 0.5 MG dissolvable tablet Take 1 tablet (0.5 mg total) by mouth daily as needed for anxiety (increased anxiety). 30 tablet 0  . amLODipine (NORVASC) 2.5 MG tablet   6  . aspirin 81 MG tablet Take 81 mg by mouth daily.      . cyanocobalamin (,VITAMIN B-12,) 1000 MCG/ML injection Inject 1,000 mcg into the muscle every 30 (thirty) days.    Marland Kitchen escitalopram (LEXAPRO) 10 MG tablet Take 1 tablet (10 mg total) by mouth daily. 90 tablet 3  . folic acid (FOLVITE) 580 MCG tablet Take 400 mcg by mouth daily.    . furosemide (LASIX) 20 MG tablet TAKE 1 TABLET(20 MG) BY MOUTH DAILY 90 tablet 3  . irbesartan (AVAPRO) 300 MG tablet Take 1 tablet (300 mg total) by mouth daily. 90 tablet 3  . metFORMIN (GLUCOPHAGE) 1000 MG tablet TAKE 1 TABLET(1000 MG) BY MOUTH TWICE DAILY WITH A MEAL 180 tablet 3  . simvastatin (ZOCOR) 40 MG tablet Take 1 tablet (40 mg total) by mouth at bedtime. 90 tablet 3   No current facility-administered medications for this visit.  ALLERGIES: Patient has no known allergies.  Family History  Problem Relation Age of Onset  . Heart attack Father 65  . Diabetes Father   . Cancer Father        lung  . Heart disease Father     Social History   Social History  . Marital status: Divorced    Spouse name: N/A  . Number of children: 3  . Years of education: 14   Occupational History  . 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 Smoking status: Former Smoker    Packs/day: 0.50    Years: 14.00    Types: Cigarettes  . Smokeless tobacco: Former Systems developer    Quit date: 10/02/2013  . Alcohol use 3.0 - 3.6 oz/week    5 - 6 Standard drinks or equivalent per week  . Drug use: No  . Sexual activity: No   Other Topics Concern  . Not on file   Social History Narrative    2 year training [place] in substance abuse counseling. BA- psych from [employer]. Married - 14 years -divorced. 3 daughters - twins 41 - one dtr,Charlotte, died 04-Apr-2012 unknown causes, 60   Work: Biomedical engineer substance abuse Social worker. No h/o physical or sexual abuse    ROS:  Pertinent items are noted in HPI.  PHYSICAL EXAMINATION:    BP 124/64 (BP Location: Right Arm, Patient Position: Sitting, Cuff Size: Large)   Pulse 80   Resp 14   Ht 5\' 5"  (1.651 m)   Wt 233 lb 6.4 oz (105.9 kg)   LMP 12/30/1998 (Approximate)   BMI 38.84 kg/m     General appearance: alert, cooperative and appears stated age   ASSESSMENT  UTI.   PLAN  Macrobid 100 mg po bid x 5 days.  Hydrate.  Urine micro and cx.  Call for worsening symptoms.    An After Visit Summary was printed and given to the patient.  ____15__ minutes face to face time of which over 50% was spent in counseling.

## 2017-07-17 NOTE — Patient Instructions (Signed)

## 2017-07-18 LAB — URINALYSIS, MICROSCOPIC ONLY
Casts: NONE SEEN /lpf
WBC, UA: >30 /hpf — AB (ref 0–?)

## 2017-07-20 LAB — URINE CULTURE

## 2017-07-23 ENCOUNTER — Ambulatory Visit: Payer: Medicare Other

## 2017-07-28 DIAGNOSIS — L309 Dermatitis, unspecified: Secondary | ICD-10-CM | POA: Diagnosis not present

## 2017-08-13 ENCOUNTER — Other Ambulatory Visit: Payer: Self-pay | Admitting: Internal Medicine

## 2017-08-13 ENCOUNTER — Telehealth: Payer: Self-pay | Admitting: Internal Medicine

## 2017-08-13 DIAGNOSIS — I10 Essential (primary) hypertension: Secondary | ICD-10-CM

## 2017-08-13 MED ORDER — FUROSEMIDE 20 MG PO TABS: 20.0000 mg | ORAL_TABLET | Freq: Every day | ORAL | 0 refills | Status: DC

## 2017-08-13 NOTE — Telephone Encounter (Signed)
Reviewed chart pt is due for annual appt in sept. Sent 30 day supply to [facility] until appt...Meredith [name]

## 2017-08-13 NOTE — Telephone Encounter (Signed)
Pt called for a refill of furosemide (LASIX) 20 MG tablet  Last seen 02-17-17 Please advise

## 2017-08-22 ENCOUNTER — Other Ambulatory Visit: Payer: Self-pay | Admitting: Internal Medicine

## 2017-08-26 ENCOUNTER — Telehealth: Payer: Self-pay | Admitting: *Deleted

## 2017-08-26 ENCOUNTER — Encounter: Payer: Self-pay | Admitting: Podiatry

## 2017-08-26 ENCOUNTER — Ambulatory Visit (INDEPENDENT_AMBULATORY_CARE_PROVIDER_SITE_OTHER): Payer: Medicare Other

## 2017-08-26 ENCOUNTER — Ambulatory Visit (INDEPENDENT_AMBULATORY_CARE_PROVIDER_SITE_OTHER): Payer: Medicare Other | Admitting: Podiatry

## 2017-08-26 VITALS — BP 146/78 | HR 70 | Temp 97.9°F

## 2017-08-26 DIAGNOSIS — S92415A Nondisplaced fracture of proximal phalanx of left great toe, initial encounter for closed fracture: Secondary | ICD-10-CM

## 2017-08-26 DIAGNOSIS — R52 Pain, unspecified: Secondary | ICD-10-CM

## 2017-08-26 DIAGNOSIS — S90222A Contusion of left lesser toe(s) with damage to nail, initial encounter: Secondary | ICD-10-CM | POA: Diagnosis not present

## 2017-08-26 MED ORDER — CEPHALEXIN 500 MG PO CAPS: 500.0000 mg | ORAL_CAPSULE | Freq: Three times a day (TID) | ORAL | 1 refills | Status: DC

## 2017-08-26 NOTE — Patient Instructions (Addendum)
Betadine Soak Instructions  Purchase an 8 oz. bottle of BETADINE solution (Povidone)  THE DAY AFTER THE PROCEDURE  Place 1 tablespoon of betadine solution in a quart of warm tap water.  Submerge your foot or feet with outer bandage intact for the initial soak; this will allow the bandage to become moist and wet for easy lift off.  Once you remove your bandage, continue to soak in the solution for 2-3 minutes.  This soak should be done twice a day.  Next, remove your foot or feet from solution, blot dry the affected area and cover.  You may use a band aid large enough to cover the area or use gauze and tape.  Apply other medications to the area as directed by the doctor such as Triple Antibiotic Ointment T  IF YOUR SKIN BECOMES IRRITATED WHILE USING THESE INSTRUCTIONS, IT IS OKAY TO SWITCH TO EPSOM SALTS AND WATER OR WHITE VINEGAR AND WATER.  Begin taking your antibiotics by mouth as prescribed

## 2017-08-26 NOTE — Progress Notes (Signed)
   Subjective:    Patient ID: Meredith [name], female    DOB: [birth year], 66 y.o.   MRN: [id number]  HPI This patient presents today with a four-day history of a drop on the injury of champagne bottles on her left great toe. Since the injury she has noticed increase in pain, swelling and enlarging blister on the top of the toe. The toe is uncomfortable walking and wearing shoes. Patient is denies any drainage from the area Patient has had no self treatment other than to reduce her standing and walking  Patient is diabetic and denies history of foot ulceration, claudication or amputation Patient denies smoking history   Review of Systems  All other systems reviewed and are negative.      Objective:   Physical Exam   Patient is pleasant appears anxious in no apparent pain and orientated 3  Vascular: No calf edema or calf tenderness bilaterally DP and PT pulses 2/4 bilaterally Capillary reflex immediate bilaterally  Neurological: Sensation to 10 g monofilament wire intact 5/5 bilaterally Vibratory sensation reactive bilaterally Ankle reflexes reactive bilaterally  Dermatological: Well-organized hematoma dorsal left hallux around the nail. There is mobility the nail plate. The distal toe is mildly edematous with ecchymosis without any open lesions. Skin texture and turgor with in normal limits Light hair growth noted bilaterally  Musculoskeletal: Patient able to weakly dorsi and planta flex left hallux  X-ray examination dated 08/26/2017  On one view in the proximal phalanx appears to be incomplete fracture line without any cortical disruption. This fracture line is not noting any other views  Radiographic impression: Suspect hairline nondisplaced fracture of the proximal phalanx left hallux       Assessment & Plan:   Assessment: Satisfactory neurovascular status Suspect nondisplaced hairline fracture proximal phalanx left hallux Subungual hematoma, left  hallux  Plan: I reviewed the results of the x-ray exam with patient today. I recommended avulsion of the nail and drained of the hematoma. Patient verbally consents The left hallux was blocked with 3 mL of 50-50 mixture of 2% plain Xylocaine and 0.5% plain Sensorcaine Toe is prepped with Betadine. The hematoma was punctured releasing a bloody drainage and completely collapsed. The left hallux nail plate was mobile and was easily avulsed as it had minimal attachment the underlying nailbed. There is no disruption of the underlying nailbed. An antibiotic dressing was applied. Patient tolerated procedure without any difficulty Postoperative diluted Betadine scrub prescribed Cephalexin 500 mg by mouth 3 times a day 7 days prescribed Surgical shoe dispensed to wear and left foot Patient instructed if she knows any sudden increase of pain, swelling, fever, redness to present to the emergency department  Reevaluate 7 days

## 2017-08-26 NOTE — Telephone Encounter (Signed)
Pt states had a toenail procedure today, and would like to know when she could shower and begin her soaks. I told pt begin showering tomorrow, and soak after showers. Pt states understanding.

## 2017-09-02 ENCOUNTER — Encounter: Payer: Self-pay | Admitting: Podiatry

## 2017-09-02 ENCOUNTER — Ambulatory Visit (INDEPENDENT_AMBULATORY_CARE_PROVIDER_SITE_OTHER): Payer: Medicare Other | Admitting: Podiatry

## 2017-09-02 VITALS — Temp 98.2°F

## 2017-09-02 DIAGNOSIS — S90222A Contusion of left lesser toe(s) with damage to nail, initial encounter: Secondary | ICD-10-CM

## 2017-09-02 DIAGNOSIS — S92415A Nondisplaced fracture of proximal phalanx of left great toe, initial encounter for closed fracture: Secondary | ICD-10-CM

## 2017-09-02 NOTE — Progress Notes (Signed)
   Subjective:    Patient ID: Meredith [name], female    DOB: 30-Nov-1951, 66 y.o.   MRN: [id number]  HPI    Review of Systems  All other systems reviewed and are negative.      Objective:   Physical Exam        Assessment & Plan:

## 2017-09-02 NOTE — Patient Instructions (Signed)
Discontinue Betadine soaks If you notice any sudden increase in pain, swelling, redness, fever present to the emergency department Continue wearing surgical shoe   ANTIBACTERIAL SOAP INSTRUCTIONS or liquid hand soap  THE DAY AFTER PROCEDURE  Please follow the instructions your doctor has marked.   Shower as usual. Before getting out, place a drop of antibacterial liquid soap (Dial) on a wet, clean washcloth.  Gently wipe washcloth over affected area.  Afterward, rinse the area with warm water.  Blot the area dry with a soft cloth and cover with antibiotic ointment (neosporin, polysporin, bacitracin) and band aid or gauze and tape  Place 3-4 drops of antibacterial liquid soap in a quart of warm tap water.  Submerge foot into water for 49minutes.  If bandage was applied after your procedure, leave on to allow for easy lift off, then remove and continue with soak for the remaining time.  Next, blot area dry with a soft cloth and cover with a bandage.  Apply other medications as directed by your doctor, such triple antibiotic ointment  Diabetes and Foot Care Diabetes may cause you to have problems because of poor blood supply (circulation) to your feet and legs. This may cause the skin on your feet to become thinner, break easier, and heal more slowly. Your skin may become dry, and the skin may peel and crack. You may also have nerve damage in your legs and feet causing decreased feeling in them. You may not notice minor injuries to your feet that could lead to infections or more serious problems. Taking care of your feet is one of the most important things you can do for yourself. Follow these instructions at home:  Wear shoes at all times, even in the house. Do not go barefoot. Bare feet are easily injured.  Check your feet daily for blisters, cuts, and redness. If you cannot see the bottom of your feet, use a mirror or ask someone for help.  Wash your feet with warm water (do not use hot  water) and mild soap. Then pat your feet and the areas between your toes until they are completely dry. Do not soak your feet as this can dry your skin.  Apply a moisturizing lotion or petroleum jelly (that does not contain alcohol and is unscented) to the skin on your feet and to dry, brittle toenails. Do not apply lotion between your toes.  Trim your toenails straight across. Do not dig under them or around the cuticle. File the edges of your nails with an emery board or nail file.  Do not cut corns or calluses or try to remove them with medicine.  Wear clean socks or stockings every day. Make sure they are not too tight. Do not wear knee-high stockings since they may decrease blood flow to your legs.  Wear shoes that fit properly and have enough cushioning. To break in new shoes, wear them for just a few hours a day. This prevents you from injuring your feet. Always look in your shoes before you put them on to be sure there are no objects inside.  Do not cross your legs. This may decrease the blood flow to your feet.  If you find a minor scrape, cut, or break in the skin on your feet, keep it and the skin around it clean and dry. These areas may be cleansed with mild soap and water. Do not cleanse the area with peroxide, alcohol, or iodine.  When you remove an adhesive bandage, be  sure not to damage the skin around it.  If you have a wound, look at it several times a day to make sure it is healing.  Do not use heating pads or hot water bottles. They may burn your skin. If you have lost feeling in your feet or legs, you may not know it is happening until it is too late.  Make sure your health care provider performs a complete foot exam at least annually or more often if you have foot problems. Report any cuts, sores, or bruises to your health care provider immediately. Contact a health care provider if:  You have an injury that is not healing.  You have cuts or breaks in the skin.  You  have an ingrown nail.  You notice redness on your legs or feet.  You feel burning or tingling in your legs or feet.  You have pain or cramps in your legs and feet.  Your legs or feet are numb.  Your feet always feel cold. Get help right away if:  There is increasing redness, swelling, or pain in or around a wound.  There is a red line that goes up your leg.  Pus is coming from a wound.  You develop a fever or as directed by your health care provider.  You notice a bad smell coming from an ulcer or wound. This information is not intended to replace advice given to you by your health care provider. Make sure you discuss any questions you have with your health care provider. Document Released: 12/13/2000 Document Revised: 05/23/2016 Document Reviewed: 05/25/2013 Elsevier Interactive Patient Education  2017 [employer].

## 2017-09-02 NOTE — Progress Notes (Signed)
Patient ID: Meredith [name], female   DOB: 19-Aug-1951, 66 y.o.   MRN: [id number]   Subjective: This patient presents for follow-up care for dropout an injury on the left great toe resulting in hematoma t and possible hairline fracture of the proximal phalanx. Patient has completed cephalexin without a complaint from medication. Patient mentions occasional sharp stabbing pains in or around the left hallux  The objective: Orientated 3 Patient appears in no apparent pain DP and PT pulses 2/4 bilaterally Reflex immediate bilaterally The left hallux nail bed is red moist appearance without any lacerations Proximal nail fold area has a 5 mm moist, pink delicate skin associated with a hematoma and avulsion of the hallux There is no obvious surrounding erythema, edema, warmth, active drainage or malodor  Assessment: Postop I&D of hematoma without clinical sign of infection Suspect hairline fracture left hallux  Plan: DC Betadine soaks Rx antibacterial soft soap or liquid hand soap soaks daily Maintain topical antibiotic ointment and light gauze dressing to the left hallux daily Maintain surgical shoe on the left hallux  Patient instructed to observe injured site she knows any sudden increase in pain, swelling, redness, fever present  to the emergency department  Reappoint 4 weeks or sooner if patient has concern

## 2017-09-10 ENCOUNTER — Other Ambulatory Visit: Payer: Self-pay | Admitting: Internal Medicine

## 2017-09-10 DIAGNOSIS — I10 Essential (primary) hypertension: Secondary | ICD-10-CM

## 2017-09-18 NOTE — Progress Notes (Signed)
Subjective:   Meredith [name] is a 66 y.o. female who presents for an Initial Medicare Annual Wellness Visit.  Review of Systems    No ROS.  Medicare Wellness Visit. Additional risk factors are reflected in the social history.  Cardiac Risk Factors include: advanced age (>31men, >81 women);diabetes mellitus;dyslipidemia;hypertension;obesity (BMI >30kg/m2);sedentary lifestyle Sleep patterns: has frequent nighttime awakenings and sleeps 5 hours nightly. Patient reports insomnia issues, discussed recommended sleep tips and stress reduction tips.   Home Safety/Smoke Alarms: Feels safe in home. Smoke alarms in place.  Living environment; residence and Firearm Safety: 2-story house, no firearms. Lives alone, no needs for DME, good support system Seat Belt Safety/Bike Helmet: Wears seat belt.     Objective:    Today's Vitals   09/19/17 1533  BP: 124/78  Pulse: 76  Resp: 20  Temp: 98.4 F (36.9 C)  SpO2: 98%  Weight: 230 lb (104.3 kg)  Height: 5\' 5"  (1.651 m)   Body mass index is 38.27 kg/m.   Current Medications (verified) Outpatient Encounter Prescriptions as of 09/19/2017  Medication Sig  . ALPRAZolam (NIRAVAM) 0.5 MG dissolvable tablet Take 1 tablet (0.5 mg total) by mouth daily as needed for anxiety (increased anxiety).  Marland Kitchen amLODipine (NORVASC) 2.5 MG tablet Take 1 tablet (2.5 mg total) by mouth daily.  Marland Kitchen aspirin 81 MG tablet Take 81 mg by mouth daily.    . cyanocobalamin (,VITAMIN B-12,) 1000 MCG/ML injection Inject 1,000 mcg into the muscle every 30 (thirty) days.  Marland Kitchen escitalopram (LEXAPRO) 20 MG tablet Take 1 tablet (20 mg total) by mouth daily. Annual appt due in Sept must see provider for future refills  . folic acid (FOLVITE) 400 MCG tablet Take 400 mcg by mouth daily.  . furosemide (LASIX) 20 MG tablet Take 1 tablet (20 mg total) by mouth 2 (two) times daily.  . irbesartan (AVAPRO) 300 MG tablet Take 1 tablet (300 mg total) by mouth daily. Annual appt due in Sept must see  provider for future refills  . metFORMIN (GLUCOPHAGE) 1000 MG tablet Take 1 tablet (1,000 mg total) by mouth 2 (two) times daily with a meal. Annual appt due in Sept must see provider for future refills  . simvastatin (ZOCOR) 40 MG tablet Take 1 tablet (40 mg total) by mouth daily at 6 PM. Annual appt due in Sept must see provider for future refills  . [DISCONTINUED] ALPRAZolam (NIRAVAM) 0.5 MG dissolvable tablet Take 1 tablet (0.5 mg total) by mouth daily as needed for anxiety (increased anxiety).  . [DISCONTINUED] amLODipine (NORVASC) 2.5 MG tablet   . [DISCONTINUED] escitalopram (LEXAPRO) 10 MG tablet Take 1 tablet (10 mg total) by mouth daily. Annual appt due in Sept must see provider for future refills  . [DISCONTINUED] furosemide (LASIX) 20 MG tablet TAKE 1 TABLET BY MOUTH DAILY  . [DISCONTINUED] irbesartan (AVAPRO) 300 MG tablet Take 1 tablet (300 mg total) by mouth daily. Annual appt due in Sept must see provider for future refills  . [DISCONTINUED] metFORMIN (GLUCOPHAGE) 1000 MG tablet Take 1 tablet (1,000 mg total) by mouth 2 (two) times daily with a meal. Annual appt due in Sept must see provider for future refills  . [DISCONTINUED] cephALEXin (KEFLEX) 500 MG capsule Take 1 capsule (500 mg total) by mouth 3 (three) times daily. (Patient not taking: Reported on 09/19/2017)  . [DISCONTINUED] nitrofurantoin, macrocrystal-monohydrate, (MACROBID) 100 MG capsule Take 1 capsule (100 mg total) by mouth 2 (two) times daily. Take for 5 days. (Patient not taking: Reported  on 09/19/2017)   No facility-administered encounter medications on file as of 09/19/2017.     Allergies (verified) Patient has no known allergies.   History: Past Medical History:  Diagnosis Date  . Anemia   . Anxiety state, unspecified   . Arthritis    KNEES  . B12 deficiency anemia 05/2014 dx  . CIN I (cervical intraepithelial neoplasia I)   . Depressive disorder, not elsewhere classified   . Diabetes mellitus without  complication ([place])   . Emphysema of lung ([place])   . Hypertension   . Internal hemorrhoids   . Other and unspecified hyperlipidemia   . Unspecified sleep apnea    Past Surgical History:  Procedure Laterality Date  . CERVICAL CERCLAGE    . CESAREAN SECTION    . COLONOSCOPY    . HEMORRHOID SURGERY    . KNEE ARTHROSCOPY Left 12/2014  . UPPER GASTROINTESTINAL ENDOSCOPY     Family History  Problem Relation Age of Onset  . Heart attack Father 31  . Diabetes Father   . Cancer Father        lung  . Heart disease Father    Social History   Occupational History  . 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 Smoking status: Former Smoker    Packs/day: 0.50    Years: 14.00    Types: Cigarettes  . Smokeless tobacco: Never Used  . Alcohol use 3.0 - 3.6 oz/week    5 - 6 Standard drinks or equivalent per week  . Drug use: No  . Sexual activity: No    Tobacco Counseling Counseling given: Not Answered   Activities of Daily Living In your present state of health, do you have any difficulty performing the following activities: 09/19/2017  Hearing? N  Vision? N  Difficulty concentrating or making decisions? N  Walking or climbing stairs? N  Dressing or bathing? N  Doing errands, shopping? N  Preparing Food and eating ? N  Using the Toilet? N  In the past six months, have you accidently leaked urine? N  Do you have problems with loss of bowel control? N  Managing your Medications? N  Managing your Finances? N  Housekeeping or managing your Housekeeping? N  Some recent data might be hidden    Immunizations and Health Maintenance Immunization History  Administered Date(s) Administered  . Influenza Split 11/19/2012  . Influenza Whole 11/02/2007, 10/26/2009  . Influenza, High Dose Seasonal PF 11/16/2015, 08/30/2016  . Influenza,inj,Quad PF,6+ Mos 10/09/2013, 10/13/2014, 11/03/2014  . Pneumococcal Conjugate-13 11/02/2007, 12/19/2015  .  Pneumococcal Polysaccharide-23 11/02/2007, 12/18/2016  . Tdap 09/23/2012  . Zoster 09/23/2012   Health Maintenance Due  Topic Date Due  . OPHTHALMOLOGY EXAM  01/13/2017  . INFLUENZA VACCINE  07/30/2017  . HEMOGLOBIN A1C  08/17/2017  . FOOT EXAM  08/30/2017    Patient Care Team: Hoyt Koch, MD as PCP - General (Internal Medicine) Elveria Rising, MD (Obstetrics and Gynecology) Lafayette Dragon, MD (Inactive) (Gastroenterology) Minus Breeding, MD as Consulting Physician (Cardiology) Rigoberto Noel, MD as Consulting Physician (Pulmonary Disease)  Indicate any recent Medical Services you may have received from other than [facility] providers in the past year (date may be approximate).     Assessment:   This is a routine wellness examination for Crystle. Physical assessment deferred to PCP.   Hearing/Vision screen Hearing Screening Comments: Able to hear conversational tones w/o difficulty. No issues reported. Passed whisper test  Vision Screening Comments:  appointment yearly   Dietary issues and exercise activities discussed: Current Exercise Habits: The patient does not participate in regular exercise at present (chair exercise pamphlet provided), Exercise limited by: orthopedic condition(s) Diet (meal preparation, eat out, water intake, caffeinated beverages, dairy products, fruits and vegetables): in general, an "unhealthy" diet   Reviewed heart healthy and diabetic diet, encouraged patient to increase daily water intake.  Goals    . <enter goal here>    . increase my physical activity and eat healthier          Do chair exercises at work and make healthier food choices.      Depression Screen PHQ 2/9 Scores 09/19/2017 11/29/2016 03/15/2016 12/15/2015  PHQ - 2 Score 3 0 1 0  PHQ- 9 Score 5 - - -    Fall Risk Fall Risk  09/19/2017 11/29/2016 12/15/2015  Falls in the past year? No No No    Cognitive Function:       Ad8 score reviewed for issues:  Issues making  decisions: no  Less interest in hobbies / activities: no  Repeats questions, stories (family complaining): no  Trouble using ordinary gadgets (microwave, computer, phone):no  Forgets the month or year: no  Mismanaging finances: no  Remembering appts: no  Daily problems with thinking and/or memory: no Ad8 score is= 0  Screening Tests Health Maintenance  Topic Date Due  . OPHTHALMOLOGY EXAM  01/13/2017  . INFLUENZA VACCINE  07/30/2017  . HEMOGLOBIN A1C  08/17/2017  . FOOT EXAM  08/30/2017  . MAMMOGRAM  10/09/2018  . TETANUS/TDAP  09/23/2022  . COLONOSCOPY  12/05/2024  . DEXA SCAN  Completed  . Hepatitis C Screening  Completed  . PNA vac Low Risk Adult  Completed      Plan:    Continue doing brain stimulating activities (puzzles, reading, adult coloring books, staying active) to keep memory sharp.   Continue to eat heart healthy diet (full of fruits, vegetables, whole grains, lean protein, water--limit salt, fat, and sugar intake) and increase physical activity as tolerated.   I have personally reviewed and noted the following in the patient's chart:   . Medical and social history . Use of alcohol, tobacco or illicit drugs  . Current medications and supplements . Functional ability and status . Nutritional status . Physical activity . Advanced directives . List of other physicians . Vitals . Screenings to include cognitive, depression, and falls . Referrals and appointments  In addition, I have reviewed and discussed with patient certain preventive protocols, quality metrics, and best practice recommendations. A written personalized care plan for preventive services as well as general preventive health recommendations were provided to patient.     Michiel Cowboy, RN   09/19/2017

## 2017-09-18 NOTE — Progress Notes (Signed)
Pre visit review using our clinic review tool, if applicable. No additional management support is needed unless otherwise documented below in the visit note. 

## 2017-09-19 ENCOUNTER — Encounter: Payer: Self-pay | Admitting: Internal Medicine

## 2017-09-19 ENCOUNTER — Ambulatory Visit (INDEPENDENT_AMBULATORY_CARE_PROVIDER_SITE_OTHER): Payer: Medicare Other | Admitting: Internal Medicine

## 2017-09-19 ENCOUNTER — Other Ambulatory Visit: Payer: Self-pay | Admitting: Internal Medicine

## 2017-09-19 ENCOUNTER — Other Ambulatory Visit (INDEPENDENT_AMBULATORY_CARE_PROVIDER_SITE_OTHER): Payer: Medicare Other

## 2017-09-19 VITALS — BP 124/78 | HR 76 | Temp 98.4°F | Resp 20 | Ht 65.0 in | Wt 230.0 lb

## 2017-09-19 DIAGNOSIS — E119 Type 2 diabetes mellitus without complications: Secondary | ICD-10-CM | POA: Diagnosis not present

## 2017-09-19 DIAGNOSIS — I10 Essential (primary) hypertension: Secondary | ICD-10-CM | POA: Diagnosis not present

## 2017-09-19 DIAGNOSIS — E538 Deficiency of other specified B group vitamins: Secondary | ICD-10-CM

## 2017-09-19 DIAGNOSIS — D519 Vitamin B12 deficiency anemia, unspecified: Secondary | ICD-10-CM

## 2017-09-19 DIAGNOSIS — F419 Anxiety disorder, unspecified: Secondary | ICD-10-CM

## 2017-09-19 DIAGNOSIS — Z0000 Encounter for general adult medical examination without abnormal findings: Secondary | ICD-10-CM

## 2017-09-19 DIAGNOSIS — Z23 Encounter for immunization: Secondary | ICD-10-CM

## 2017-09-19 LAB — HEMOGLOBIN A1C: HEMOGLOBIN A1C: 7.8 % — AB (ref 4.6–6.5)

## 2017-09-19 LAB — COMPREHENSIVE METABOLIC PANEL
ALT: 17 U/L (ref 0–35)
AST: 16 U/L (ref 0–37)
Albumin: 4.1 g/dL (ref 3.5–5.2)
Alkaline Phosphatase: 64 U/L (ref 39–117)
BILIRUBIN TOTAL: 0.3 mg/dL (ref 0.2–1.2)
BUN: 14 mg/dL (ref 6–23)
CHLORIDE: 102 meq/L (ref 96–112)
CO2: 30 meq/L (ref 19–32)
Calcium: 9.5 mg/dL (ref 8.4–10.5)
Creatinine, Ser: 0.7 mg/dL (ref 0.40–1.20)
GFR: 88.53 mL/min (ref >60.00)
GLUCOSE: 185 mg/dL — AB (ref 70–99)
Potassium: 3.5 mEq/L (ref 3.5–5.1)
Sodium: 140 mEq/L (ref 135–145)
Total Protein: 7.5 g/dL (ref 6.0–8.3)

## 2017-09-19 LAB — LIPID PANEL
Cholesterol: 171 mg/dL (ref 0–200)
HDL: 42.8 mg/dL (ref >39.00)
LDL CALC: 89 mg/dL (ref 0–99)
NONHDL: 127.76
Total CHOL/HDL Ratio: 4
Triglycerides: 195 mg/dL — ABNORMAL HIGH (ref 0.0–149.0)
VLDL: 39 mg/dL (ref 0.0–40.0)

## 2017-09-19 LAB — CBC
HEMATOCRIT: 36.6 % (ref 36.0–46.0)
HEMOGLOBIN: 11.7 g/dL — AB (ref 12.0–15.0)
MCHC: 32 g/dL (ref 30.0–36.0)
MCV: 87.5 fl (ref 78.0–100.0)
PLATELETS: 270 10*3/uL (ref 150.0–400.0)
RBC: 4.19 Mil/uL (ref 3.87–5.11)
RDW: 14.3 % (ref 11.5–15.5)
WBC: 8 10*3/uL (ref 4.0–10.5)

## 2017-09-19 LAB — VITAMIN B12: Vitamin B-12: 375 pg/mL (ref 211–911)

## 2017-09-19 MED ORDER — IRBESARTAN 300 MG PO TABS: 300.0000 mg | ORAL_TABLET | Freq: Every day | ORAL | 3 refills | Status: DC

## 2017-09-19 MED ORDER — ALPRAZOLAM 0.5 MG PO TBDP: 0.5000 mg | ORAL_TABLET | Freq: Every day | ORAL | 0 refills | Status: DC | PRN

## 2017-09-19 MED ORDER — METFORMIN HCL 1000 MG PO TABS: 1000.0000 mg | ORAL_TABLET | Freq: Two times a day (BID) | ORAL | 3 refills | Status: DC

## 2017-09-19 MED ORDER — FUROSEMIDE 20 MG PO TABS: 20.0000 mg | ORAL_TABLET | Freq: Two times a day (BID) | ORAL | 3 refills | Status: DC

## 2017-09-19 MED ORDER — ESCITALOPRAM OXALATE 20 MG PO TABS: 20.0000 mg | ORAL_TABLET | Freq: Every day | ORAL | 0 refills | Status: DC

## 2017-09-19 MED ORDER — AMLODIPINE BESYLATE 2.5 MG PO TABS: 2.5000 mg | ORAL_TABLET | Freq: Every day | ORAL | 3 refills | Status: DC

## 2017-09-19 NOTE — Progress Notes (Signed)
   Subjective:    Patient ID: Meredith [name], female    DOB: 05-03-1951, 66 y.o.   MRN: [id number]  HPI Here for follow up of medical problems including her B12 deficiency (stopped taking monthly shots and is now taking daily pill, wants to know if levels are okay, denies new numbness or weakness, no change in energy level), and her diabetes (eye exam with Herbert Deaner, up to date, taking metformin and on ARB and statin, not complicated, denies new numbness or weakness), and her blood pressure (taking irbesartan, amlodipine, furosemide, denies side effects, no chest pains or headaches or SOB).   Review of Systems  Constitutional: Negative.   HENT: Negative.   Eyes: Negative.   Respiratory: Negative for cough, chest tightness and shortness of breath.   Cardiovascular: Negative for chest pain, palpitations and leg swelling.  Gastrointestinal: Negative for abdominal distention, abdominal pain, constipation, diarrhea, nausea and vomiting.  Musculoskeletal: Negative.   Skin: Negative.   Neurological: Negative.   Psychiatric/Behavioral: Negative.       Objective:   Physical Exam  Constitutional: She is oriented to person, place, and time. She appears well-developed and well-nourished.  HENT:  Head: Normocephalic and atraumatic.  Eyes: EOM are normal.  Neck: Normal range of motion.  Cardiovascular: Normal rate and regular rhythm.   Pulmonary/Chest: Effort normal and breath sounds normal. No respiratory distress. She has no wheezes. She has no rales.  Abdominal: Soft. Bowel sounds are normal. She exhibits no distension. There is no tenderness. There is no rebound.  Musculoskeletal: She exhibits no edema.  Neurological: She is alert and oriented to person, place, and time. Coordination normal.  Skin: Skin is warm and dry.  See foot exam  Psychiatric: She has a normal mood and affect.   Vitals:   09/19/17 1533  BP: 124/78  Pulse: 76  Resp: 20  Temp: 98.4 F (36.9 C)  SpO2: 98%  Weight: 230 lb  (104.3 kg)  Height: 5\' 5"  (1.651 m)      Assessment & Plan:  Flu shot given at visit

## 2017-09-19 NOTE — Patient Instructions (Addendum)
Continue doing brain stimulating activities (puzzles, reading, adult coloring books, staying active) to keep memory sharp.   Continue to eat heart healthy diet (full of fruits, vegetables, whole grains, lean protein, water--limit salt, fat, and sugar intake) and increase physical activity as tolerated.  Influenza Virus Vaccine injection What is this medicine? INFLUENZA VIRUS VACCINE (in floo EN zuh VAHY ruhs vak SEEN) helps to reduce the risk of getting influenza also known as the flu. The vaccine only helps protect you against some strains of the flu. This medicine may be used for other purposes; ask your health care provider or pharmacist if you have questions. COMMON BRAND NAME(S): Afluria, Agriflu, Alfuria, FLUAD, Fluarix, Fluarix Quadrivalent, Flublok, Flublok Quadrivalent, FLUCELVAX, Flulaval, Fluvirin, Fluzone, Fluzone High-Dose, Fluzone Intradermal What should I tell my health care provider before I take this medicine? They need to know if you have any of these conditions: -bleeding disorder like hemophilia -fever or infection -Guillain-Barre syndrome or other neurological problems -immune system problems -infection with the human immunodeficiency virus (HIV) or AIDS -low blood platelet counts -multiple sclerosis -an unusual or allergic reaction to influenza virus vaccine, latex, other medicines, foods, dyes, or preservatives. Different brands of vaccines contain different allergens. Some may contain latex or eggs. Talk to your doctor about your allergies to make sure that you get the right vaccine. -pregnant or trying to get pregnant -breast-feeding How should I use this medicine? This vaccine is for injection into a muscle or under the skin. It is given by a health care professional. A copy of Vaccine Information Statements will be given before each vaccination. Read this sheet carefully each time. The sheet may change frequently. Talk to your healthcare provider to see which  vaccines are right for you. Some vaccines should not be used in all age groups. Overdosage: If you think you have taken too much of this medicine contact a poison control center or emergency room at once. NOTE: This medicine is only for you. Do not share this medicine with others. What if I miss a dose? This does not apply. What may interact with this medicine? -chemotherapy or radiation therapy -medicines that lower your immune system like etanercept, anakinra, infliximab, and adalimumab -medicines that treat or prevent blood clots like warfarin -phenytoin -steroid medicines like prednisone or cortisone -theophylline -vaccines This list may not describe all possible interactions. Give your health care provider a list of all the medicines, herbs, non-prescription drugs, or dietary supplements you use. Also tell them if you smoke, drink alcohol, or use illegal drugs. Some items may interact with your medicine. What should I watch for while using this medicine? Report any side effects that do not go away within 3 days to your doctor or health care professional. Call your health care provider if any unusual symptoms occur within 6 weeks of receiving this vaccine. You may still catch the flu, but the illness is not usually as bad. You cannot get the flu from the vaccine. The vaccine will not protect against colds or other illnesses that may cause fever. The vaccine is needed every year. What side effects may I notice from receiving this medicine? Side effects that you should report to your doctor or health care professional as soon as possible: -allergic reactions like skin rash, itching or hives, swelling of the face, lips, or tongue Side effects that usually do not require medical attention (report to your doctor or health care professional if they continue or are bothersome): -fever -headache -muscle aches and pains -pain,   tenderness, redness, or swelling at the injection  site -tiredness This list may not describe all possible side effects. Call your doctor for medical advice about side effects. You may report side effects to FDA at 1-800-FDA-1088. Where should I keep my medicine? The vaccine will be given by a health care professional in a clinic, pharmacy, doctor's office, or other health care setting. You will not be given vaccine doses to store at home. NOTE: This sheet is a summary. It may not cover all possible information. If you have questions about this medicine, talk to your doctor, pharmacist, or health care provider.  2018 Elsevier/Gold Standard (2015-07-07 10:07:28)  

## 2017-09-21 NOTE — Assessment & Plan Note (Signed)
Taking irbesartan, amlodipine, furosemide. Checking CMP and adjust as needed.

## 2017-09-21 NOTE — Progress Notes (Signed)
Patient ID: Meredith [name], female   DOB: 12-Mar-1951, 66 y.o.   MRN: [id number] Medical screening examination/treatment/procedure(s) were performed by non-physician practitioner and as supervising physician I was immediately available for consultation/collaboration. I agree with above. Hoyt Koch, MD

## 2017-09-21 NOTE — Assessment & Plan Note (Signed)
Foot exam done, taking metformin and on ARB and statin. Checking HgA1c and adjust for goal <7.5. Not complicated.

## 2017-09-21 NOTE — Assessment & Plan Note (Signed)
Stopped taking her injections and is now on oral replacement. Checking B12 level and adjust as needed. No clinical symptoms of low levels.

## 2017-10-03 ENCOUNTER — Other Ambulatory Visit: Payer: Self-pay | Admitting: Internal Medicine

## 2017-10-03 DIAGNOSIS — Z1231 Encounter for screening mammogram for malignant neoplasm of breast: Secondary | ICD-10-CM

## 2017-10-06 ENCOUNTER — Ambulatory Visit: Payer: Medicare Other | Admitting: Podiatry

## 2017-10-27 ENCOUNTER — Encounter (INDEPENDENT_AMBULATORY_CARE_PROVIDER_SITE_OTHER): Payer: Medicare Other | Admitting: Podiatry

## 2017-10-27 NOTE — Progress Notes (Signed)
This encounter was created in error - please disregard.

## 2017-10-29 ENCOUNTER — Other Ambulatory Visit: Payer: Self-pay | Admitting: Internal Medicine

## 2017-11-03 ENCOUNTER — Ambulatory Visit: Payer: Medicare Other | Admitting: Obstetrics and Gynecology

## 2017-11-03 ENCOUNTER — Encounter: Payer: Self-pay | Admitting: Obstetrics and Gynecology

## 2017-11-03 NOTE — Progress Notes (Deleted)
66 y.o. G86P3004 Divorced Caucasian female here for annual exam.    PCP:     Patient's last menstrual period was 12/30/1998 (approximate).           Sexually active: {yes no:314532}  The current method of family planning is post menopausal status.    Exercising: {yes RC:789381}  {types:19826} Smoker:  Former  Health Maintenance: Pap: 05-14-17 Neg:Neg Hr HPV, 10-23-16 Neg:Neg HR HPV,08-28-15 Neg:Pos HR HPV History of abnormal Pap:  Yes, 08-28-15 Neg:Pos HR HPV;  Status post colposcopy - LGSIL 09/20/15. Colposcopy unsatisfactory.  Inflammation of vagina noted after procedure on 09/27/15. Dr. Denman George consultation on 10/16/15 for cervix flush with vagina and unsatisfactory colpo. Pap and HR HPV recommended in one year due to concordance of pap and colpo bx.  Recommended hysterectomy only if needed more clear diagnosis or work up for HGSIL pap or biopsy.  LEEP and conization not recommended due to cervix flush with cervix. History of possible DES exposure noted at that visit.  01-06-17 colposcopy with cervical bx showing granulation tissue with atypia. Repeat pap 05-14-17 Neg:Neg HR HPV MMG:  10-11-8  Density C/Neg/BiRads1:TBC--APPT. 11-14-17 Colonoscopy: 12-02-14 benign polyp Dr. Ulyses Southward next 2020 or 2025.  BMD: 02-27-16  Result :Osteopenia of bilateral hips, normal spine:[facility].  TDaP:  09-23-12 Gardasil:   no HIV: Hep C: 02-27-16 Neg Screening Labs:  Hb today: ***, Urine today: ***   reports that she has quit smoking. Her smoking use included cigarettes. She has a 7.00 pack-year smoking history. she has never used smokeless tobacco. She reports that she drinks about 3.0 - 3.6 oz of alcohol per week. She reports that she does not use drugs.  Past Medical History:  Diagnosis Date  . Anemia   . Anxiety state, unspecified   . Arthritis    KNEES  . B12 deficiency anemia 05/2014 dx  . CIN I (cervical intraepithelial neoplasia I)   . Depressive disorder, not elsewhere classified   .  Diabetes mellitus without complication ([place])   . Emphysema of lung ([place])   . Hypertension   . Internal hemorrhoids   . Other and unspecified hyperlipidemia   . Unspecified sleep apnea     Past Surgical History:  Procedure Laterality Date  . CERVICAL CERCLAGE    . CESAREAN SECTION    . COLONOSCOPY    . HEMORRHOID SURGERY    . KNEE ARTHROSCOPY Left 12/2014  . UPPER GASTROINTESTINAL ENDOSCOPY      Current Outpatient Medications  Medication Sig Dispense Refill  . ALPRAZolam (NIRAVAM) 0.5 MG dissolvable tablet Take 1 tablet (0.5 mg total) by mouth daily as needed for anxiety (increased anxiety). 30 tablet 0  . amLODipine (NORVASC) 2.5 MG tablet Take 1 tablet (2.5 mg total) by mouth daily. 90 tablet 3  . aspirin 81 MG tablet Take 81 mg by mouth daily.      . cyanocobalamin (,VITAMIN B-12,) 1000 MCG/ML injection Inject 1,000 mcg into the muscle every 30 (thirty) days.    Marland Kitchen escitalopram (LEXAPRO) 20 MG tablet TAKE 1 TABLET BY MOUTH EVERY DAY 90 tablet 0  . escitalopram (LEXAPRO) 20 MG tablet TAKE 1 TABLET BY MOUTH EVERY DAY 90 tablet 1  . folic acid (FOLVITE) 017 MCG tablet Take 400 mcg by mouth daily.    . furosemide (LASIX) 20 MG tablet Take 1 tablet (20 mg total) by mouth 2 (two) times daily. 180 tablet 3  . irbesartan (AVAPRO) 300 MG tablet Take 1 tablet (300 mg total) by mouth daily. Annual  appt due in Sept must see provider for future refills 90 tablet 3  . metFORMIN (GLUCOPHAGE) 1000 MG tablet Take 1 tablet (1,000 mg total) by mouth 2 (two) times daily with a meal. Annual appt due in Sept must see provider for future refills 180 tablet 3  . simvastatin (ZOCOR) 40 MG tablet Take 1 tablet (40 mg total) by mouth daily at 6 PM. Annual appt due in Sept must see provider for future refills 90 tablet 0   No current facility-administered medications for this visit.     Family History  Problem Relation Age of Onset  . Heart attack Father 34  . Diabetes Father   . Cancer Father         lung  . Heart disease Father     ROS:  Pertinent items are noted in HPI.  Otherwise, a comprehensive ROS was negative.  Exam:   LMP 12/30/1998 (Approximate)     General appearance: alert, cooperative and appears stated age Head: Normocephalic, without obvious abnormality, atraumatic Neck: no adenopathy, supple, symmetrical, trachea midline and thyroid normal to inspection and palpation Lungs: clear to auscultation bilaterally Breasts: normal appearance, no masses or tenderness, No nipple retraction or dimpling, No nipple discharge or bleeding, No axillary or supraclavicular adenopathy Heart: regular rate and rhythm Abdomen: soft, non-tender; no masses, no organomegaly Extremities: extremities normal, atraumatic, no cyanosis or edema Skin: Skin color, texture, turgor normal. No rashes or lesions Lymph nodes: Cervical, supraclavicular, and axillary nodes normal. No abnormal inguinal nodes palpated Neurologic: Grossly normal  Pelvic: External genitalia:  no lesions              Urethra:  normal appearing urethra with no masses, tenderness or lesions              Bartholins and Skenes: normal                 Vagina: normal appearing vagina with normal color and discharge, no lesions              Cervix: no lesions              Pap taken: {yes no:314532} Bimanual Exam:  Uterus:  normal size, contour, position, consistency, mobility, non-tender              Adnexa: no mass, fullness, tenderness              Rectal exam: {yes no:314532}.  Confirms.              Anus:  normal sphincter tone, no lesions  Chaperone was present for exam.  Assessment:   Well woman visit with normal exam.   Plan: Mammogram screening discussed. Recommended self breast awareness. Pap and HR HPV as above. Guidelines for Calcium, Vitamin D, regular exercise program including cardiovascular and weight bearing exercise.   Follow up annually and prn.   Additional counseling given.  {yes [id number]. _______  minutes face to face time of which over 50% was spent in counseling.    After visit summary provided.

## 2017-11-14 ENCOUNTER — Ambulatory Visit: Payer: Medicare Other

## 2017-12-02 ENCOUNTER — Telehealth: Payer: Self-pay | Admitting: *Deleted

## 2017-12-02 NOTE — Telephone Encounter (Signed)
Patient in 08 recall for 10/2017. Patient DNKA on 11/03/17 for AEX. Please contact patient regarding rescheduling

## 2017-12-03 ENCOUNTER — Ambulatory Visit: Payer: Medicare Other

## 2017-12-10 NOTE — Telephone Encounter (Signed)
Called patient to schedule AEX/pap at [phone], lmovm to call me back.

## 2017-12-29 ENCOUNTER — Other Ambulatory Visit: Payer: Self-pay | Admitting: Internal Medicine

## 2018-01-05 NOTE — Telephone Encounter (Signed)
Spoke with patient and advised it's time for repeat pap smear and AEX. She apologized for missing appointment in November stating she forgot to write it down. She states she is driving at the moment but will call back when gets home to schedule repeat pap/AEX. She thanked me for calling.

## 2018-01-28 ENCOUNTER — Ambulatory Visit
Admission: RE | Admit: 2018-01-28 | Discharge: 2018-01-28 | Disposition: A | Discharge status: Home / Self Care | Payer: Medicare Other | Source: Ambulatory Visit | Attending: Internal Medicine | Admitting: Internal Medicine

## 2018-01-28 DIAGNOSIS — Z1231 Encounter for screening mammogram for malignant neoplasm of breast: Secondary | ICD-10-CM | POA: Diagnosis not present

## 2018-02-06 ENCOUNTER — Encounter: Payer: Self-pay | Admitting: Obstetrics and Gynecology

## 2018-02-06 ENCOUNTER — Other Ambulatory Visit: Payer: Self-pay

## 2018-02-06 ENCOUNTER — Ambulatory Visit (INDEPENDENT_AMBULATORY_CARE_PROVIDER_SITE_OTHER): Payer: Medicare Other | Admitting: Obstetrics and Gynecology

## 2018-02-06 ENCOUNTER — Ambulatory Visit: Payer: Medicare Other | Admitting: Obstetrics and Gynecology

## 2018-02-06 VITALS — BP 116/60 | HR 76 | Resp 14 | Ht 65.0 in | Wt 222.0 lb

## 2018-02-06 DIAGNOSIS — Z124 Encounter for screening for malignant neoplasm of cervix: Secondary | ICD-10-CM | POA: Diagnosis not present

## 2018-02-06 DIAGNOSIS — Z01419 Encounter for gynecological examination (general) (routine) without abnormal findings: Secondary | ICD-10-CM

## 2018-02-06 NOTE — Patient Instructions (Signed)

## 2018-02-06 NOTE — Progress Notes (Signed)
67 y.o. G23P3004 Divorced Caucasian female here for annual exam.    No vaginal bleeding.   Has bleeding hemorrhoids since childhood.  Hx thrombosed hemorrhoid.  Hair thinning.  Has a dermatologist.   Lost 11 pounds since last visit.  Eating less.   Current URI.  Did get flu vaccine.   Labs with PCP.   PCP: Dr. Pricilla Holm    Patient's last menstrual period was 12/30/1998 (approximate).           Sexually active: No.  The current method of family planning is post menopausal status.    Exercising: No.  The patient does not participate in regular exercise at present. Smoker:  Former   Health Maintenance: Pap: 05/14/17 pap and HR HPV negative   10/23/16 Pap and HR HPV negative -- colpo 01/06/17 showed granulation tissue with atypia History of abnormal Pap:  Yes. 1/8/18Pap 08/28/15 showed normal cells and positive HR HPV. Status post colposcopy - LGSIL 09/20/15. Colposcopy unsatisfactory.  Inflammation of vagina noted after procedure on 09/27/15. Dr. Denman George consultation on 10/16/15 for cervix flush with vagina and unsatisfactory colpo. Pap and HR HPV recommended in one year due to concordance of pap and colpo bx.  Recommended hysterectomy only if needed more clear diagnosis or work up for HGSIL pap or biopsy.  LEEP and conization not recommended due to cervix flush with cervix. History of possible DES exposure noted at that visit.   MMG: 01/28/18 BIRADS 1 negative/density c Colonoscopy:  12-02-14 benign polyp Dr. Ulyses Southward 2020 or 2025 BMD:   02/27/16  Result  Osteopenia of bilateral hips, normal spine TDaP:  2013 HIV: unsure Hep C: negative 02/27/16 Screening Labs: PCP   reports that she has quit smoking. Her smoking use included cigarettes. She has a 7.00 pack-year smoking history. she has never used smokeless tobacco. She reports that she drinks about 3.0 - 3.6 oz of alcohol per week. She reports that she does not use drugs.  Past Medical History:  Diagnosis Date  .  Anemia   . Anxiety state, unspecified   . Arthritis    KNEES  . B12 deficiency anemia 05/2014 dx  . CIN I (cervical intraepithelial neoplasia I)   . Depressive disorder, not elsewhere classified   . Diabetes mellitus without complication ([place])   . Emphysema of lung ([place])   . Hypertension   . Internal hemorrhoids   . Other and unspecified hyperlipidemia   . Unspecified sleep apnea     Past Surgical History:  Procedure Laterality Date  . CERVICAL CERCLAGE    . CESAREAN SECTION    . COLONOSCOPY    . HEMORRHOID SURGERY    . KNEE ARTHROSCOPY Left 12/2014  . UPPER GASTROINTESTINAL ENDOSCOPY      Current Outpatient Medications  Medication Sig Dispense Refill  . ALPRAZolam (NIRAVAM) 0.5 MG dissolvable tablet Take 1 tablet (0.5 mg total) by mouth daily as needed for anxiety (increased anxiety). 30 tablet 0  . amLODipine (NORVASC) 2.5 MG tablet Take 1 tablet (2.5 mg total) by mouth daily. 90 tablet 3  . aspirin 81 MG tablet Take 81 mg by mouth daily.      Marland Kitchen escitalopram (LEXAPRO) 20 MG tablet TAKE 1 TABLET BY MOUTH EVERY DAY 90 tablet 0  . folic acid (FOLVITE) 616 MCG tablet Take 400 mcg by mouth daily.    . furosemide (LASIX) 20 MG tablet Take 1 tablet (20 mg total) by mouth 2 (two) times daily. 180 tablet 3  . irbesartan (AVAPRO) 300 MG  tablet Take 1 tablet (300 mg total) by mouth daily. Annual appt due in Sept must see provider for future refills 90 tablet 3  . metFORMIN (GLUCOPHAGE) 1000 MG tablet Take 1 tablet (1,000 mg total) by mouth 2 (two) times daily with a meal. Annual appt due in Sept must see provider for future refills 180 tablet 3  . simvastatin (ZOCOR) 40 MG tablet TAKE 1 TABLET BY MOUTH DAILY AT 6PM 90 tablet 2   No current facility-administered medications for this visit.     Family History  Problem Relation Age of Onset  . Heart attack Father 26  . Diabetes Father   . Cancer Father        lung  . Heart disease Father   . Breast cancer Neg Hx     ROS:   Pertinent items are noted in HPI.  Otherwise, a comprehensive ROS was negative.  Exam:   BP 116/60 (BP Location: Right Arm, Patient Position: Sitting, Cuff Size: Large)   Pulse 76   Resp 14   Ht 5\' 5"  (1.651 m)   Wt 222 lb (100.7 kg)   LMP 12/30/1998 (Approximate)   BMI 36.94 kg/m     General appearance: alert, cooperative and appears stated age Head: Normocephalic, without obvious abnormality, atraumatic Neck: no adenopathy, supple, symmetrical, trachea midline and thyroid normal to inspection and palpation Lungs: clear to auscultation bilaterally Breasts: normal appearance, no masses or tenderness, No nipple retraction or dimpling, No nipple discharge or bleeding, No axillary or supraclavicular adenopathy Heart: regular rate and rhythm Abdomen: soft, non-tender; no masses, no organomegaly Extremities: extremities normal, atraumatic, no cyanosis or edema Skin: Skin color, texture, turgor normal. No rashes or lesions Lymph nodes: Cervical, supraclavicular, and axillary nodes normal. No abnormal inguinal nodes palpated Neurologic: Grossly normal  Pelvic: External genitalia:  no lesions              Urethra:  normal appearing urethra with no masses, tenderness or lesions              Bartholins and Skenes: normal                 Vagina: normal appearing vagina with normal color and discharge, no lesions              Cervix: cervix flush with vaginal cuff.  Atrophy noted.               Pap taken: Yes.   Bimanual Exam:  Uterus:  normal size, contour, position, consistency, mobility, non-tender              Adnexa: no mass, fullness, tenderness              Rectal exam: Yes.  .  Confirms.              Anus:  normal sphincter tone,  Hemorrhoids.  Mild rectal prolapse?  Chaperone was present for exam.  Assessment:   Well woman visit with normal exam. Hx cervical lesion.  Atypia on colpo biopsy in [place]. 2018. Hx prior LGSIL on colposcopy 2016. Had positive HR HPV then. Hx potential  DES exposure.  Cervix flush with vagina. Osteopenia.   Plan: Mammogram screening discussed. Recommended self breast awareness. Pap and HR HPV as above. If pap is abnormal or positive HR HPV, will do colposcopy.  Patient aware. Guidelines for Calcium, Vitamin D, regular exercise program including cardiovascular and weight bearing exercise. BMD through PCP this year or next. Follow up annually  and prn.    After visit summary provided.

## 2018-02-10 ENCOUNTER — Ambulatory Visit (INDEPENDENT_AMBULATORY_CARE_PROVIDER_SITE_OTHER): Payer: Medicare Other | Admitting: Urgent Care

## 2018-02-10 ENCOUNTER — Other Ambulatory Visit: Payer: Self-pay

## 2018-02-10 ENCOUNTER — Encounter: Payer: Self-pay | Admitting: Urgent Care

## 2018-02-10 VITALS — BP 130/70 | HR 96 | Temp 100.7°F | Resp 16 | Ht 64.5 in | Wt 215.4 lb

## 2018-02-10 DIAGNOSIS — R05 Cough: Secondary | ICD-10-CM

## 2018-02-10 DIAGNOSIS — R0789 Other chest pain: Secondary | ICD-10-CM

## 2018-02-10 DIAGNOSIS — R509 Fever, unspecified: Secondary | ICD-10-CM | POA: Diagnosis not present

## 2018-02-10 DIAGNOSIS — J0100 Acute maxillary sinusitis, unspecified: Secondary | ICD-10-CM | POA: Diagnosis not present

## 2018-02-10 DIAGNOSIS — J3489 Other specified disorders of nose and nasal sinuses: Secondary | ICD-10-CM

## 2018-02-10 DIAGNOSIS — R059 Cough, unspecified: Secondary | ICD-10-CM

## 2018-02-10 MED ORDER — ACETAMINOPHEN 500 MG PO TABS
1000.0000 mg | ORAL_TABLET | Freq: Once | ORAL | Status: AC
  Administered 2018-02-10: 1000 mg via ORAL

## 2018-02-10 MED ORDER — AMOXICILLIN 500 MG PO CAPS: 500.0000 mg | ORAL_CAPSULE | Freq: Three times a day (TID) | ORAL | 0 refills | Status: DC

## 2018-02-10 MED ORDER — BENZONATATE 100 MG PO CAPS: 100.0000 mg | ORAL_CAPSULE | Freq: Three times a day (TID) | ORAL | 0 refills | Status: DC | PRN

## 2018-02-10 MED ORDER — HYDROCOD POLST-CPM POLST ER 10-8 MG/5ML PO SUER: 5.0000 mL | Freq: Every evening | ORAL | 0 refills | Status: DC | PRN

## 2018-02-10 NOTE — Progress Notes (Signed)
  MRN: [id number] DOB: 06/03/1951  Subjective:   Meredith [name] is a 67 y.o. female presenting for   Meredith [name] has a current medication list which includes the following prescription(s): alprazolam, amlodipine, aspirin, escitalopram, folic acid, furosemide, irbesartan, metformin, and simvastatin. Also has No Known Allergies.  Meredith [name]  has a past medical history of Anemia, Anxiety state, unspecified, Arthritis, B12 deficiency anemia (05/2014 dx), CIN I (cervical intraepithelial neoplasia I), Depressive disorder, not elsewhere classified, Diabetes mellitus without complication ([place]), Emphysema of lung ([place]), Hypertension, Internal hemorrhoids, Other and unspecified hyperlipidemia, and Unspecified sleep apnea. Also  has a past surgical history that includes Hemorrhoid surgery; Cesarean section; Knee arthroscopy (Left, 12/2014); Colonoscopy; Upper gastrointestinal endoscopy; and Cervical cerclage.  Objective:   Vitals: BP 130/70 (BP Location: Right Arm, Patient Position: Sitting, Cuff Size: Large)   Pulse 96   Temp (!) 100.7 F (38.2 C) (Oral)   Resp 16   Ht 5' 4.5" (1.638 m)   Wt 215 lb 6.4 oz (97.7 kg)   LMP 12/30/1998 (Approximate)   SpO2 96%   BMI 36.40 kg/m   Physical Exam  Constitutional: She is oriented to person, place, and time. She appears well-developed and well-nourished.  Eyes: Right eye exhibits no discharge. Left eye exhibits no discharge.  Neck: Normal range of motion. Neck supple.  Cardiovascular: Normal rate, regular rhythm and intact distal pulses. Exam reveals no gallop and no friction rub.  No murmur heard. Pulmonary/Chest: No respiratory distress. She has no wheezes. She has no rales.  Lymphadenopathy:    She has no cervical adenopathy.  Neurological: She is alert and oriented to person, place, and time.  Skin: Skin is warm and dry.  Psychiatric: She has a normal mood and affect.   Assessment and Plan :   Fever, unspecified - Plan: acetaminophen (TYLENOL) tablet 1,000  mg    Jaynee Eagles, PA-C [facility] [phone] 02/10/2018  5:50 PM

## 2018-02-10 NOTE — Progress Notes (Signed)
    MRN: [id number] DOB: November 06, 1951  Subjective:   Meredith [name] is a 67 y.o. female presenting for 1 week history of worsening sinus pain, runny nose, bilateral ear pain, sore throat, productive cough, fatigue, body aches, now having fever. Cough is eliciting chest pain, shob. Denies n/v, abdominal pain. Denies smoking cigarettes. Denies history of CKD, liver disease. Denies history of allergies.  Meredith [name] has a current medication list which includes the following prescription(s): alprazolam, amlodipine, aspirin, escitalopram, folic acid, furosemide, irbesartan, metformin, and simvastatin. Also has No Known Allergies.  Meredith [name]  has a past medical history of Anemia, Anxiety state, unspecified, Arthritis, B12 deficiency anemia (05/2014 dx), CIN I (cervical intraepithelial neoplasia I), Depressive disorder, not elsewhere classified, Diabetes mellitus without complication ([place]), Emphysema of lung ([place]), Hypertension, Internal hemorrhoids, Other and unspecified hyperlipidemia, and Unspecified sleep apnea. Also  has a past surgical history that includes Hemorrhoid surgery; Cesarean section; Knee arthroscopy (Left, 12/2014); Colonoscopy; Upper gastrointestinal endoscopy; and Cervical cerclage.  Objective:   Vitals: BP 130/70 (BP Location: Right Arm, Patient Position: Sitting, Cuff Size: Large)   Pulse 96   Temp (!) 100.7 F (38.2 C) (Oral)   Resp 16   Ht 5' 4.5" (1.638 m)   Wt 215 lb 6.4 oz (97.7 kg)   LMP 12/30/1998 (Approximate)   SpO2 96%   BMI 36.40 kg/m   Physical Exam  Constitutional: She is oriented to person, place, and time. She appears well-developed and well-nourished.  HENT:  TM's intact bilaterally, no effusions or erythema. Nasal turbinates dry, erythematous, nasal passages patent. Mild, bilateral maxillary sinus tenderness. Oropharynx without exudates, mucous membranes moist.    Eyes: Right eye exhibits no discharge. Left eye exhibits no discharge.  Neck: Normal range of motion. Neck supple.   Cardiovascular: Normal rate, regular rhythm and intact distal pulses. Exam reveals no gallop and no friction rub.  No murmur heard. Pulmonary/Chest: No respiratory distress. She has no wheezes. She has no rales.  Lymphadenopathy:    She has no cervical adenopathy.  Neurological: She is alert and oriented to person, place, and time.  Skin: Skin is warm and dry.  Psychiatric: She has a normal mood and affect.   Assessment and Plan :   Acute non-recurrent maxillary sinusitis  Fever, unspecified - Plan: acetaminophen (TYLENOL) tablet 1,000 mg  Sinus pain  Cough  Atypical chest pain  Patient had concerns about the flu. I counseled that it is viral and at this point could not prescribe Tamiflu and really is not necessary to do flu swab. Will manage her symptoms supportively and cover her for a sinus infection with amoxicillin. Return-to-clinic precautions discussed, patient verbalized understanding.   Jaynee Eagles, PA-C [facility] [phone] 02/10/2018  5:53 PM

## 2018-02-10 NOTE — Patient Instructions (Addendum)
Sinusitis, Adult Sinusitis is soreness and inflammation of your sinuses. Sinuses are hollow spaces in the bones around your face. Your sinuses are located:  Around your eyes.  In the middle of your forehead.  Behind your nose.  In your cheekbones.  Your sinuses and nasal passages are lined with a stringy fluid (mucus). Mucus normally drains out of your sinuses. When your nasal tissues become inflamed or swollen, the mucus can become trapped or blocked so air cannot flow through your sinuses. This allows bacteria, viruses, and funguses to grow, which leads to infection. Sinusitis can develop quickly and last for 7?10 days (acute) or for more than 12 weeks (chronic). Sinusitis often develops after a cold. What are the causes? This condition is caused by anything that creates swelling in the sinuses or stops mucus from draining, including:  Allergies.  Asthma.  Bacterial or viral infection.  Abnormally shaped bones between the nasal passages.  Nasal growths that contain mucus (nasal polyps).  Narrow sinus openings.  Pollutants, such as chemicals or irritants in the air.  A foreign object stuck in the nose.  A fungal infection. This is rare.  What increases the risk? The following factors may make you more likely to develop this condition:  Having allergies or asthma.  Having had a recent cold or respiratory tract infection.  Having structural deformities or blockages in your nose or sinuses.  Having a weak immune system.  Doing a lot of swimming or diving.  Overusing nasal sprays.  Smoking.  What are the signs or symptoms? The main symptoms of this condition are pain and a feeling of pressure around the affected sinuses. Other symptoms include:  Upper toothache.  Earache.  Headache.  Bad breath.  Decreased sense of smell and taste.  A cough that may get worse at night.  Fatigue.  Fever.  Thick drainage from your nose. The drainage is often green and  it may contain pus (purulent).  Stuffy nose or congestion.  Postnasal drip. This is when extra mucus collects in the throat or back of the nose.  Swelling and warmth over the affected sinuses.  Sore throat.  Sensitivity to light.  How is this diagnosed? This condition is diagnosed based on symptoms, a medical history, and a physical exam. To find out if your condition is acute or chronic, your health care provider may:  Look in your nose for signs of nasal polyps.  Tap over the affected sinus to check for signs of infection.  View the inside of your sinuses using an imaging device that has a light attached (endoscope).  If your health care provider suspects that you have chronic sinusitis, you may also:  Be tested for allergies.  Have a sample of mucus taken from your nose (nasal culture) and checked for bacteria.  Have a mucus sample examined to see if your sinusitis is related to an allergy.  If your sinusitis does not respond to treatment and it lasts longer than 8 weeks, you may have an MRI or CT scan to check your sinuses. These scans also help to determine how severe your infection is. In rare cases, a bone biopsy may be done to rule out more serious types of fungal sinus disease. How is this treated? Treatment for sinusitis depends on the cause and whether your condition is chronic or acute. If a virus is causing your sinusitis, your symptoms will go away on their own within 10 days. You may be given medicines to relieve your symptoms,   including:  Topical nasal decongestants. They shrink swollen nasal passages and let mucus drain from your sinuses.  Antihistamines. These drugs block inflammation that is triggered by allergies. This can help to ease swelling in your nose and sinuses.  Topical nasal corticosteroids. These are nasal sprays that ease inflammation and swelling in your nose and sinuses.  Nasal saline washes. These rinses can help to get rid of thick mucus in  your nose.  If your condition is caused by bacteria, you will be given an antibiotic medicine. If your condition is caused by a fungus, you will be given an antifungal medicine. Surgery may be needed to correct underlying conditions, such as narrow nasal passages. Surgery may also be needed to remove polyps. Follow these instructions at home: Medicines  Take, use, or apply over-the-counter and prescription medicines only as told by your health care provider. These may include nasal sprays.  If you were prescribed an antibiotic medicine, take it as told by your health care provider. Do not stop taking the antibiotic even if you start to feel better. Hydrate and Humidify  Drink enough water to keep your urine clear or pale yellow. Staying hydrated will help to thin your mucus.  Use a cool mist humidifier to keep the humidity level in your home above 50%.  Inhale steam for 10-15 minutes, 3-4 times a day or as told by your health care provider. You can do this in the bathroom while a hot shower is running.  Limit your exposure to cool or dry air. Rest  Rest as much as possible.  Sleep with your head raised (elevated).  Make sure to get enough sleep each night. General instructions  Apply a warm, moist washcloth to your face 3-4 times a day or as told by your health care provider. This will help with discomfort.  Wash your hands often with soap and water to reduce your exposure to viruses and other germs. If soap and water are not available, use hand sanitizer.  Do not smoke. Avoid being around people who are smoking (secondhand smoke).  Keep all follow-up visits as told by your health care provider. This is important. Contact a health care provider if:  You have a fever.  Your symptoms get worse.  Your symptoms do not improve within 10 days. Get help right away if:  You have a severe headache.  You have persistent vomiting.  You have pain or swelling around your face or  eyes.  You have vision problems.  You develop confusion.  Your neck is stiff.  You have trouble breathing. This information is not intended to replace advice given to you by your health care provider. Make sure you discuss any questions you have with your health care provider. Document Released: 12/16/2005 Document Revised: 08/11/2016 Document Reviewed: 10/11/2015 Elsevier Interactive Patient Education  2018 [employer].     IF you received an x-ray today, you will receive an invoice from [facility]. Please contact [facility] at [phone] with questions or concerns regarding your invoice.   IF you received labwork today, you will receive an invoice from [facility]. Please contact [facility] at [phone] with questions or concerns regarding your invoice.   Our billing staff will not be able to assist you with questions regarding bills from these companies.  You will be contacted with the lab results as soon as they are available. The fastest way to get your results is to activate your My Chart account. Instructions are located on the last page  of this paperwork. If you have not heard from [place] regarding the results in 2 weeks, please contact this office.

## 2018-02-11 ENCOUNTER — Other Ambulatory Visit (HOSPITAL_COMMUNITY)
Admission: RE | Admit: 2018-02-11 | Discharge: 2018-02-11 | Disposition: A | Discharge status: Home / Self Care | Payer: Medicare Other | Source: Ambulatory Visit | Attending: Obstetrics and Gynecology | Admitting: Obstetrics and Gynecology

## 2018-02-11 DIAGNOSIS — Z01419 Encounter for gynecological examination (general) (routine) without abnormal findings: Secondary | ICD-10-CM | POA: Insufficient documentation

## 2018-02-11 NOTE — Addendum Note (Signed)
Addended by: Yisroel Ramming, Dietrich Pates E on: 02/11/2018 04:30 PM   Modules accepted: Orders

## 2018-02-15 LAB — CYTOLOGY - PAP
Diagnosis: NEGATIVE
HPV (WINDOPATH): NOT DETECTED

## 2018-03-05 ENCOUNTER — Encounter: Payer: Self-pay | Admitting: Internal Medicine

## 2018-03-05 DIAGNOSIS — H25013 Cortical age-related cataract, bilateral: Secondary | ICD-10-CM | POA: Diagnosis not present

## 2018-03-05 DIAGNOSIS — H2512 Age-related nuclear cataract, left eye: Secondary | ICD-10-CM | POA: Diagnosis not present

## 2018-03-05 DIAGNOSIS — H35372 Puckering of macula, left eye: Secondary | ICD-10-CM | POA: Diagnosis not present

## 2018-03-05 DIAGNOSIS — H35033 Hypertensive retinopathy, bilateral: Secondary | ICD-10-CM | POA: Diagnosis not present

## 2018-03-05 DIAGNOSIS — H2513 Age-related nuclear cataract, bilateral: Secondary | ICD-10-CM | POA: Diagnosis not present

## 2018-03-05 LAB — HM DIABETES EYE EXAM

## 2018-03-28 ENCOUNTER — Ambulatory Visit (INDEPENDENT_AMBULATORY_CARE_PROVIDER_SITE_OTHER): Payer: Medicare Other | Admitting: Family Medicine

## 2018-03-28 ENCOUNTER — Encounter: Payer: Self-pay | Admitting: Family Medicine

## 2018-03-28 VITALS — BP 132/76 | HR 87 | Temp 98.2°F | Wt 218.0 lb

## 2018-03-28 DIAGNOSIS — J438 Other emphysema: Secondary | ICD-10-CM

## 2018-03-28 MED ORDER — ALBUTEROL SULFATE HFA 108 (90 BASE) MCG/ACT IN AERS: 2.0000 | INHALATION_SPRAY | Freq: Four times a day (QID) | RESPIRATORY_TRACT | 2 refills | Status: DC | PRN

## 2018-03-28 MED ORDER — PREDNISONE 20 MG PO TABS: 20.0000 mg | ORAL_TABLET | Freq: Two times a day (BID) | ORAL | 0 refills | Status: AC

## 2018-03-28 MED ORDER — SPACER/AERO-HOLDING CHAMBERS DEVI: 0 refills | Status: DC

## 2018-03-28 MED ORDER — BENZONATATE 100 MG PO CAPS: 100.0000 mg | ORAL_CAPSULE | Freq: Two times a day (BID) | ORAL | 0 refills | Status: DC | PRN

## 2018-03-28 NOTE — Progress Notes (Signed)
Subjective:  Patient ID: Meredith [name], female    DOB: [birth year]  Age: 67 y.o. MRN: [id number]  CC: Cough (dry, x1 wk)   HPI Meredith [name] presents for 5 day ho dry cough with out fever or chills. Pnd. Ho copd.  Outpatient Medications Prior to Visit  Medication Sig Dispense Refill  . ALPRAZolam (NIRAVAM) 0.5 MG dissolvable tablet Take 1 tablet (0.5 mg total) by mouth daily as needed for anxiety (increased anxiety). 30 tablet 0  . amLODipine (NORVASC) 2.5 MG tablet Take 1 tablet (2.5 mg total) by mouth daily. 90 tablet 3  . aspirin 81 MG tablet Take 81 mg by mouth daily.      Marland Kitchen escitalopram (LEXAPRO) 20 MG tablet TAKE 1 TABLET BY MOUTH EVERY DAY 90 tablet 0  . folic acid (FOLVITE) 371 MCG tablet Take 400 mcg by mouth daily.    . furosemide (LASIX) 20 MG tablet Take 1 tablet (20 mg total) by mouth 2 (two) times daily. 180 tablet 3  . irbesartan (AVAPRO) 300 MG tablet Take 1 tablet (300 mg total) by mouth daily. Annual appt due in Sept must see provider for future refills 90 tablet 3  . metFORMIN (GLUCOPHAGE) 1000 MG tablet Take 1 tablet (1,000 mg total) by mouth 2 (two) times daily with a meal. Annual appt due in Sept must see provider for future refills 180 tablet 3  . simvastatin (ZOCOR) 40 MG tablet TAKE 1 TABLET BY MOUTH DAILY AT 6PM 90 tablet 2  . amoxicillin (AMOXIL) 500 MG capsule Take 1 capsule (500 mg total) by mouth 3 (three) times daily. 21 capsule 0  . benzonatate (TESSALON) 100 MG capsule Take 1-2 capsules (100-200 mg total) by mouth 3 (three) times daily as needed. 60 capsule 0  . chlorpheniramine-HYDROcodone (TUSSIONEX PENNKINETIC ER) 10-8 MG/5ML SUER Take 5 mLs by mouth at bedtime as needed. 100 mL 0   No facility-administered medications prior to visit.     ROS Review of Systems  Constitutional: Negative for chills, fatigue and unexpected weight change.  HENT: Positive for postnasal drip. Negative for rhinorrhea, sinus pressure and sinus pain.   Eyes: Negative.     Respiratory: Positive for cough. Negative for chest tightness.   Gastrointestinal: Negative.   Musculoskeletal: Negative.   Allergic/Immunologic: Negative for immunocompromised state.  Hematological: Does not bruise/bleed easily.  Psychiatric/Behavioral: Negative.     Objective:  BP 132/76 (BP Location: Left Arm, Patient Position: Sitting, Cuff Size: Normal)   Pulse 87   Temp 98.2 F (36.8 C) (Oral)   Wt 218 lb (98.9 kg)   LMP 12/30/1998 (Approximate)   SpO2 98%   BMI 36.84 kg/m   BP Readings from Last 3 Encounters:  03/28/18 132/76  02/10/18 130/70  02/06/18 116/60    Wt Readings from Last 3 Encounters:  03/28/18 218 lb (98.9 kg)  02/10/18 215 lb 6.4 oz (97.7 kg)  02/06/18 222 lb (100.7 kg)    Physical Exam  Constitutional: She is oriented to person, place, and time. She appears well-developed and well-nourished. No distress.  HENT:  Head: Atraumatic.  Right Ear: External ear normal.  Left Ear: External ear normal.  Mouth/Throat: Oropharynx is clear and moist. No oropharyngeal exudate.  Eyes: Right eye exhibits no discharge. Left eye exhibits no discharge. No scleral icterus.  Neck: Neck supple. No JVD present. No tracheal deviation present. No thyromegaly present.  Cardiovascular: Normal rate, regular rhythm and normal heart sounds.  Pulmonary/Chest: Effort normal. No respiratory distress. She has wheezes.  She has no rales.  Abdominal: Bowel sounds are normal.  Lymphadenopathy:    She has no cervical adenopathy.  Neurological: She is alert and oriented to person, place, and time.  Skin: Skin is warm and dry. She is not diaphoretic.  Psychiatric: She has a normal mood and affect.    Lab Results  Component Value Date   WBC 8.0 09/19/2017   HGB 11.7 (L) 09/19/2017   HCT 36.6 09/19/2017   PLT 270.0 09/19/2017   GLUCOSE 185 (H) 09/19/2017   CHOL 171 09/19/2017   TRIG 195.0 (H) 09/19/2017   HDL 42.80 09/19/2017   LDLCALC 89 09/19/2017   ALT 17 09/19/2017    AST 16 09/19/2017   NA 140 09/19/2017   K 3.5 09/19/2017   CL 102 09/19/2017   CREATININE 0.70 09/19/2017   BUN 14 09/19/2017   CO2 30 09/19/2017   TSH 2.29 02/17/2017   HGBA1C 7.8 (H) 09/19/2017    No results found.  Assessment & Plan:   Meredith [name] was seen today for cough.  Diagnoses and all orders for this visit:  Other emphysema ([place]) -     predniSONE (DELTASONE) 20 MG tablet; Take 1 tablet (20 mg total) by mouth 2 (two) times daily with a meal for 7 days. -     albuterol (PROVENTIL HFA;VENTOLIN HFA) 108 (90 Base) MCG/ACT inhaler; Inhale 2 puffs into the lungs every 6 (six) hours as needed for wheezing or shortness of breath. -     Spacer/Aero-Holding Chambers DEVI; Use with inhaler -     benzonatate (TESSALON) 100 MG capsule; Take 1 capsule (100 mg total) by mouth 2 (two) times daily as needed for cough.   I have discontinued Meredith [name]'s amoxicillin, benzonatate, and chlorpheniramine-HYDROcodone. I am also having her start on predniSONE, albuterol, Spacer/Aero-Holding Chambers, and benzonatate. Additionally, I am having her maintain her aspirin, folic acid, furosemide, irbesartan, metFORMIN, amLODipine, ALPRAZolam, escitalopram, and simvastatin.  Meds ordered this encounter  Medications  . predniSONE (DELTASONE) 20 MG tablet    Sig: Take 1 tablet (20 mg total) by mouth 2 (two) times daily with a meal for 7 days.    Dispense:  14 tablet    Refill:  0  . albuterol (PROVENTIL HFA;VENTOLIN HFA) 108 (90 Base) MCG/ACT inhaler    Sig: Inhale 2 puffs into the lungs every 6 (six) hours as needed for wheezing or shortness of breath.    Dispense:  1 Inhaler    Refill:  2  . Spacer/Aero-Holding Chambers DEVI    Sig: Use with inhaler    Dispense:  1 each    Refill:  0  . benzonatate (TESSALON) 100 MG capsule    Sig: Take 1 capsule (100 mg total) by mouth 2 (two) times daily as needed for cough.    Dispense:  20 capsule    Refill:  0     Follow-up: Return in about 1 week (around  04/04/2018), or if symptoms worsen or fail to improve.  Meredith Maw, MD

## 2018-03-28 NOTE — Patient Instructions (Signed)

## 2018-04-29 ENCOUNTER — Ambulatory Visit (INDEPENDENT_AMBULATORY_CARE_PROVIDER_SITE_OTHER): Payer: Medicare Other | Admitting: Family

## 2018-04-29 ENCOUNTER — Ambulatory Visit (INDEPENDENT_AMBULATORY_CARE_PROVIDER_SITE_OTHER)
Admission: RE | Admit: 2018-04-29 | Discharge: 2018-04-29 | Disposition: A | Discharge status: Home / Self Care | Payer: Medicare Other | Source: Ambulatory Visit | Attending: Family | Admitting: Family

## 2018-04-29 ENCOUNTER — Encounter: Payer: Self-pay | Admitting: Family

## 2018-04-29 VITALS — BP 130/78 | HR 67 | Temp 98.5°F | Ht 65.4 in | Wt 224.6 lb

## 2018-04-29 DIAGNOSIS — R079 Chest pain, unspecified: Secondary | ICD-10-CM | POA: Diagnosis not present

## 2018-04-29 DIAGNOSIS — R05 Cough: Secondary | ICD-10-CM

## 2018-04-29 DIAGNOSIS — R053 Chronic cough: Secondary | ICD-10-CM

## 2018-04-29 MED ORDER — DOXYCYCLINE HYCLATE 100 MG PO TABS: 100.0000 mg | ORAL_TABLET | Freq: Two times a day (BID) | ORAL | 0 refills | Status: DC

## 2018-04-29 MED ORDER — FLUTICASONE PROPIONATE 50 MCG/ACT NA SUSP: 2.0000 | Freq: Every day | NASAL | 6 refills | Status: DC

## 2018-04-29 NOTE — Progress Notes (Signed)
Meredith [name] is a 67 y.o. female with the following history as recorded in [employer]:  Patient Active Problem List   Diagnosis Date Noted  . B12 deficiency anemia   . Other emphysema ([place]) 02/23/2014  . 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[place]) 12/31/2013  . Essential hypertension, benign 11/05/2013  . Routine health maintenance 09/27/2012  . Vitamin D deficiency 06/20/2011  . OBESITY, CLASS II 03/18/2011  . PULMONARY NODULE 07/12/2010  . Diabetes mellitus type 2, controlled, without complications ([place]) 29/52/8413  . Hyperlipidemia 10/01/2007  . ANXIETY 10/01/2007  . Depression 10/01/2007  . OSA (obstructive sleep apnea) 10/01/2007    Current Outpatient Medications  Medication Sig Dispense Refill  . albuterol (PROVENTIL HFA;VENTOLIN HFA) 108 (90 Base) MCG/ACT inhaler Inhale 2 puffs into the lungs every 6 (six) hours as needed for wheezing or shortness of breath. 1 Inhaler 2  . ALPRAZolam (NIRAVAM) 0.5 MG dissolvable tablet Take 1 tablet (0.5 mg total) by mouth daily as needed for anxiety (increased anxiety). 30 tablet 0  . amLODipine (NORVASC) 2.5 MG tablet Take 1 tablet (2.5 mg total) by mouth daily. 90 tablet 3  . aspirin 81 MG tablet Take 81 mg by mouth daily.      . benzonatate (TESSALON) 100 MG capsule Take 1 capsule (100 mg total) by mouth 2 (two) times daily as needed for cough. 20 capsule 0  . escitalopram (LEXAPRO) 20 MG tablet TAKE 1 TABLET BY MOUTH EVERY DAY 90 tablet 0  . folic acid (FOLVITE) 244 MCG tablet Take 400 mcg by mouth daily.    . furosemide (LASIX) 20 MG tablet Take 1 tablet (20 mg total) by mouth 2 (two) times daily. 180 tablet 3  . irbesartan (AVAPRO) 300 MG tablet Take 1 tablet (300 mg total) by mouth daily. Annual appt due in Sept must see provider for future refills 90 tablet 3  . metFORMIN (GLUCOPHAGE) 1000 MG tablet Take 1 tablet (1,000 mg total) by mouth 2 (two) times daily with a meal. Annual appt due in Sept must see provider for future refills 180 tablet 3   . simvastatin (ZOCOR) 40 MG tablet TAKE 1 TABLET BY MOUTH DAILY AT 6PM 90 tablet 2  . Spacer/Aero-Holding Dorise Bullion Use with inhaler 1 each 0  . doxycycline (VIBRA-TABS) 100 MG tablet Take 1 tablet (100 mg total) by mouth 2 (two) times daily. 20 tablet 0  . fluticasone (FLONASE) 50 MCG/ACT nasal spray Place 2 sprays into both nostrils daily. 16 g 6   No current facility-administered medications for this visit.     Allergies: Patient has no known allergies.  Past Medical History:  Diagnosis Date  . Anemia   . Anxiety state, unspecified   . Arthritis    KNEES  . B12 deficiency anemia 05/2014 dx  . CIN I (cervical intraepithelial neoplasia I)   . Depressive disorder, not elsewhere classified   . Diabetes mellitus without complication ([place])   . Emphysema of lung ([place])   . Hypertension   . Internal hemorrhoids   . Other and unspecified hyperlipidemia   . Unspecified sleep apnea     Past Surgical History:  Procedure Laterality Date  . CERVICAL CERCLAGE    . CESAREAN SECTION    . COLONOSCOPY    . HEMORRHOID SURGERY    . KNEE ARTHROSCOPY Left 12/2014  . UPPER GASTROINTESTINAL ENDOSCOPY      Family History  Problem Relation Age of Onset  . Heart attack Father 4  . Diabetes Father   . Cancer  Father        lung  . Heart disease Father   . Breast cancer Neg Hx     Social History   Tobacco Use  . Smoking status: Former Smoker    Packs/day: 0.50    Years: 14.00    Pack years: 7.00    Types: Cigarettes  . Smokeless tobacco: Never Used  Substance Use Topics  . Alcohol use: Yes    Alcohol/week: 3.0 - 3.6 oz    Types: 5 - 6 Standard drinks or equivalent per week    Subjective:  Patient presents with concerns for persisting cough/ congestion; was treated for COPD exacerbation at the end of March but did not take the prednisone given at that time as she started feeling better; has documented emphysema per pulmonology; complaining of persisting hoarseness/ coughing; notes she  was a smoker for at least 28 years and wonders if she needs to get a lung cancer screen done; denies any fever or chest pain; was treated for sinus infection in mid-February and not sure she ever completely cleared the original infection.   Objective:  Vitals:   04/29/18 1131  BP: 130/78  Pulse: 67  Temp: 98.5 F (36.9 C)  TempSrc: Oral  SpO2: 97%  Weight: 224 lb 9.6 oz (101.9 kg)  Height: 5' 5.4" (1.661 m)    General: Well developed, well nourished, in no acute distress  Skin : Warm and dry.  Head: Normocephalic and atraumatic  Eyes: Sclera and conjunctiva clear; pupils round and reactive to light; extraocular movements intact  Ears: External normal; canals clear; tympanic membranes normal  Oropharynx: Pink, supple. No suspicious lesions  Neck: Supple without thyromegaly, adenopathy  Lungs: Respirations unlabored; clear to auscultation bilaterally without wheeze, rales, rhonchi  CVS exam: normal rate and regular rhythm.  Neurologic: Alert and oriented; speech intact; face symmetrical; moves all extremities well; CNII-XII intact without focal deficit  Assessment:  1. Persistent cough for 3 weeks or longer     Plan:  Update CXR today; Rx for Doxycycline 100 mg bid x 10 days, Flonase and asked her to take the Prednisone she was given last month and never started; increase fluids, rest and follow-up worse, no better.   No follow-ups on file.  Orders Placed This Encounter  Procedures  . DG Chest 2 View    Standing Status:   Future    Number of Occurrences:   1    Standing Expiration Date:   06/30/2019    Order Specific Question:   Reason for Exam (SYMPTOM  OR DIAGNOSIS REQUIRED)    Answer:   persistent cough    Order Specific Question:   Preferred imaging location?    Answer:   [facility]    Order Specific Question:   Radiology Contrast Protocol - do NOT remove file path    Answer:   [email or web address]    Requested Prescriptions    Signed Prescriptions Disp Refills  . doxycycline (VIBRA-TABS) 100 MG tablet 20 tablet 0    Sig: Take 1 tablet (100 mg total) by mouth 2 (two) times daily.  . fluticasone (FLONASE) 50 MCG/ACT nasal spray 16 g 6    Sig: Place 2 sprays into both nostrils daily.

## 2018-04-30 ENCOUNTER — Telehealth: Payer: Self-pay

## 2018-04-30 NOTE — Telephone Encounter (Signed)
Patient wants to proceed with trying to get the lung cancer CT screening since she recalls smoking now for 39 years.  Please advise  Copied from [place] [phone]. Topic: Quick Communication - Office Called Patient >> Apr 30, 2018 10:30 AM Marcina Millard, CMA wrote: Reason for CRM: Called and left message for patient. If she returns call okay to release following info to her:  Please let her know that her CXR was normal. Also the reason why Mickel Baas was having such a difficult time getting the CT ordered is that Medicare will only pay if she smoked for minimum of 30 years. As long as she is aware of potential costs, she can order scan if patient would like to proceed. However since CT was normal this was very reassuring.  >> Apr 30, 2018 11:35 AM Scherrie Gerlach wrote: Pt states after she sat down and counted it up, she actually smoked a total of 39 yrs. So she would like to have that changed and re submit. Will wait to hear back

## 2018-05-01 ENCOUNTER — Other Ambulatory Visit: Payer: Self-pay | Admitting: Family

## 2018-05-01 DIAGNOSIS — Z122 Encounter for screening for malignant neoplasm of respiratory organs: Secondary | ICD-10-CM

## 2018-05-01 NOTE — Telephone Encounter (Signed)
I ordered test for lung cancer screen as discussed; she should be hearing about that.

## 2018-05-01 NOTE — Telephone Encounter (Signed)
Called and left message for patient.

## 2018-05-08 ENCOUNTER — Other Ambulatory Visit: Payer: Self-pay | Admitting: Internal Medicine

## 2018-05-13 ENCOUNTER — Telehealth: Payer: Self-pay | Admitting: Acute Care

## 2018-05-13 DIAGNOSIS — Z87891 Personal history of nicotine dependence: Secondary | ICD-10-CM

## 2018-05-13 DIAGNOSIS — Z122 Encounter for screening for malignant neoplasm of respiratory organs: Secondary | ICD-10-CM

## 2018-05-15 NOTE — Telephone Encounter (Signed)
Spoke with pt and scheduled SDMV 05/29/18 4:00 CT ordered Nothing further needed

## 2018-05-29 ENCOUNTER — Ambulatory Visit (INDEPENDENT_AMBULATORY_CARE_PROVIDER_SITE_OTHER)
Admission: RE | Admit: 2018-05-29 | Discharge: 2018-05-29 | Disposition: A | Discharge status: Home / Self Care | Payer: Medicare Other | Source: Ambulatory Visit | Attending: Acute Care | Admitting: Acute Care

## 2018-05-29 ENCOUNTER — Encounter: Payer: Self-pay | Admitting: Acute Care

## 2018-05-29 ENCOUNTER — Ambulatory Visit (INDEPENDENT_AMBULATORY_CARE_PROVIDER_SITE_OTHER): Payer: Medicare Other | Admitting: Acute Care

## 2018-05-29 DIAGNOSIS — Z87891 Personal history of nicotine dependence: Secondary | ICD-10-CM

## 2018-05-29 DIAGNOSIS — Z122 Encounter for screening for malignant neoplasm of respiratory organs: Secondary | ICD-10-CM

## 2018-05-29 NOTE — Progress Notes (Signed)
Shared Decision Making Visit Lung Cancer Screening Program [phone])   Eligibility:  Age 67 y.o.  Pack Years Smoking History Calculation 48 pack year smoking history (# packs/per year x # years smoked)  Recent History of coughing up blood  no  Unexplained weight loss? no ( >Than 15 pounds within the last 6 months )  Prior History Lung / other cancer no (Diagnosis within the last 5 years already requiring surveillance chest CT Scans).  Smoking Status Former Smoker  Former Smokers: Years since quit: 5 years  Quit Date: 2014  Visit Components:  Discussion included one or more decision making aids. yes  Discussion included risk/benefits of screening. yes  Discussion included potential follow up diagnostic testing for abnormal scans. yes  Discussion included meaning and risk of over diagnosis. yes  Discussion included meaning and risk of False Positives. yes  Discussion included meaning of total radiation exposure. yes  Counseling Included:  Importance of adherence to annual lung cancer LDCT screening. yes  Impact of comorbidities on ability to participate in the program. yes  Ability and willingness to under diagnostic treatment. yes  Smoking Cessation Counseling:  Current Smokers:   Discussed importance of smoking cessation. No>> Former smoker  Information about tobacco cessation classes and interventions provided to patient. yes  Patient provided with "ticket" for LDCT Scan. yes  Symptomatic Patient. No  Counseling  Diagnosis Code: Tobacco Use Z72.0  Asymptomatic Patient yes  Counseling (Intermediate counseling: > three minutes counseling) I6962  Former Smokers:   Discussed the importance of maintaining cigarette abstinence. yes  Diagnosis Code: Personal History of Nicotine Dependence. X52.841  Information about tobacco cessation classes and interventions provided to patient. Yes  Patient provided with "ticket" for LDCT Scan. yes  Written Order  for Lung Cancer Screening with LDCT placed in Epic. Yes (CT Chest Lung Cancer Screening Low Dose W/O CM) [id number] Z12.2-Screening of respiratory organs Z87.891-Personal history of nicotine dependence   I spent 25 minutes of face to face time with Ms. Mccollister discussing the risks and benefits of lung cancer screening. We viewed a power point together that explained in detail the above noted topics. We took the time to pause the power point at intervals to allow for questions to be asked and answered to ensure understanding. We discussed that she had taken the single most powerful action possible to decrease her risk of developing lung cancer when she quit smoking. I counseled her to remain smoke free, and to contact me if she ever had the desire to smoke again so that I can provide resources and tools to help support the effort to remain smoke free. We discussed the time and location of the scan, and that either  Doroteo Glassman RN or I will call with the results within  24-48 hours of receiving them. She has my card and contact information in the event she needs to speak with me, in addition to a copy of the power point we reviewed as a resource. She verbalized understanding of all of the above and had no further questions upon leaving the office.     I explained to the patient that there has been a high incidence of coronary artery disease noted on these exams. I explained that this is a non-gated exam therefore degree or severity cannot be determined. This patient is currently on statin therapy. I have asked the patient to follow-up with their PCP regarding any incidental finding of coronary artery disease and management with diet or medication as they  feel is clinically indicated. The patient verbalized understanding of the above and had no further questions.      Magdalen Spatz, NP 05/29/2018 4:30 PM

## 2018-06-03 ENCOUNTER — Telehealth: Payer: Self-pay | Admitting: Acute Care

## 2018-06-03 DIAGNOSIS — Z87891 Personal history of nicotine dependence: Secondary | ICD-10-CM

## 2018-06-03 DIAGNOSIS — Z122 Encounter for screening for malignant neoplasm of respiratory organs: Secondary | ICD-10-CM

## 2018-06-03 NOTE — Telephone Encounter (Signed)
Pt informed of CT results per Sarah Groce, NP.  PT verbalized understanding.  Copy sent to PCP.  Order placed for 1 yr f/u CT.  

## 2018-06-06 ENCOUNTER — Encounter: Payer: Self-pay | Admitting: Family Medicine

## 2018-06-06 ENCOUNTER — Ambulatory Visit (INDEPENDENT_AMBULATORY_CARE_PROVIDER_SITE_OTHER): Payer: Medicare Other | Admitting: Family Medicine

## 2018-06-06 VITALS — BP 114/70 | HR 86 | Temp 99.2°F | Wt 223.0 lb

## 2018-06-06 DIAGNOSIS — B9689 Other specified bacterial agents as the cause of diseases classified elsewhere: Secondary | ICD-10-CM | POA: Diagnosis not present

## 2018-06-06 DIAGNOSIS — J208 Acute bronchitis due to other specified organisms: Secondary | ICD-10-CM

## 2018-06-06 MED ORDER — AZITHROMYCIN 250 MG PO TABS: ORAL_TABLET | ORAL | 0 refills | Status: DC

## 2018-06-06 MED ORDER — BENZONATATE 100 MG PO CAPS: 100.0000 mg | ORAL_CAPSULE | Freq: Three times a day (TID) | ORAL | 0 refills | Status: DC | PRN

## 2018-06-06 NOTE — Progress Notes (Signed)
Chief Complaint  Patient presents with  . Cough    X 4 days    [name] here for URI complaints.  Duration: 4 days  Associated symptoms: sinus congestion, rhinorrhea, shortness of breath, myalgia and productive cough Denies: sinus pain, itchy watery eyes, ear pain, ear drainage, sore throat, wheezing and fever Treatment to date: [place]il Sick contacts: Yes - Granddaughter  ROS:  Const: Denies fevers HEENT: As noted in HPI Lungs: +cough  Past Medical History:  Diagnosis Date  . Anemia   . Anxiety state, unspecified   . Arthritis    KNEES  . B12 deficiency anemia 05/2014 dx  . CIN I (cervical intraepithelial neoplasia I)   . Depressive disorder, not elsewhere classified   . Diabetes mellitus without complication ([place])   . Emphysema of lung ([place])   . Hypertension   . Internal hemorrhoids   . Other and unspecified hyperlipidemia   . Unspecified sleep apnea      BP 114/70   Pulse 86   Temp 99.2 F (37.3 C) (Oral)   Wt 223 lb (101.2 kg)   LMP 12/30/1998 (Approximate)   SpO2 95%   BMI 36.66 kg/m  General: Awake, alert, appears stated age HEENT: AT, [place], ears patent b/l and TM's neg, nares patent w/o discharge, pharynx pink and without exudates, MMM Neck: No masses or asymmetry Heart: RRR Lungs: CTAB, no accessory muscle use Psych: Age appropriate judgment and insight, normal mood and affect  Acute bacterial bronchitis - Plan: azithromycin (ZITHROMAX) 250 MG tablet, benzonatate (TESSALON) 100 MG capsule  Orders as above. Wait a few days before taking abx. Macrolide given questionable exposure to croup? Continue to push fluids, practice good hand hygiene, cover mouth when coughing. F/u prn. If starting to experience fevers, shaking, or shortness of breath, seek immediate care. Pt voiced understanding and agreement to the plan.  [place], DO 06/06/18 11:56 AM

## 2018-06-06 NOTE — Patient Instructions (Signed)
Continue to push fluids, practice good hand hygiene, and cover your mouth if you cough.  If you start having fevers, shaking or shortness of breath, seek immediate care.  Use Tessalon Perles for the next couple days. If you still aren't better use the Zpak.  For symptoms, consider using Vick's VapoRub on chest or under nose, air humidifier, Benadryl at night, and elevating the head of the bed. Tylenol and ibuprofen for aches and pains you may be experiencing.   Let us know if you need anything.

## 2018-06-28 ENCOUNTER — Other Ambulatory Visit: Payer: Self-pay | Admitting: Physician Assistant

## 2018-06-28 ENCOUNTER — Other Ambulatory Visit: Payer: Self-pay | Admitting: Internal Medicine

## 2018-06-28 DIAGNOSIS — I10 Essential (primary) hypertension: Secondary | ICD-10-CM

## 2018-06-29 DIAGNOSIS — H2513 Age-related nuclear cataract, bilateral: Secondary | ICD-10-CM | POA: Diagnosis not present

## 2018-06-29 DIAGNOSIS — H25013 Cortical age-related cataract, bilateral: Secondary | ICD-10-CM | POA: Diagnosis not present

## 2018-06-29 DIAGNOSIS — H35372 Puckering of macula, left eye: Secondary | ICD-10-CM | POA: Diagnosis not present

## 2018-06-29 DIAGNOSIS — H35033 Hypertensive retinopathy, bilateral: Secondary | ICD-10-CM | POA: Diagnosis not present

## 2018-07-07 DIAGNOSIS — H25812 Combined forms of age-related cataract, left eye: Secondary | ICD-10-CM | POA: Diagnosis not present

## 2018-07-07 DIAGNOSIS — H2512 Age-related nuclear cataract, left eye: Secondary | ICD-10-CM | POA: Diagnosis not present

## 2018-08-05 DIAGNOSIS — H40013 Open angle with borderline findings, low risk, bilateral: Secondary | ICD-10-CM | POA: Diagnosis not present

## 2018-08-26 ENCOUNTER — Telehealth: Payer: Self-pay | Admitting: Internal Medicine

## 2018-08-26 NOTE — Telephone Encounter (Signed)
Copied from [place] [phone]. Topic: Inquiry >> Aug 26, 2018  4:05 PM Mylinda Latina, NT wrote: Reason for CRM: Patient called and states she would like to transfer care from Dr. Sharlet Salina to [facility]. Patient did not disclose the reason why  Please call when the request is approved CB# [phone]   Dr. Sharlet Salina are you okay with this?  Mickel Baas are you okay with this?

## 2018-08-27 NOTE — Telephone Encounter (Signed)
Fine

## 2018-08-28 NOTE — Telephone Encounter (Signed)
Okay with me 

## 2018-09-01 NOTE — Telephone Encounter (Signed)
LVM to inform patient & asking her to call us back to set up a transfer care appointment to [facility]. This could be done when it is time for her CPE in 01/2019.

## 2018-09-02 DIAGNOSIS — H1859 Other hereditary corneal dystrophies: Secondary | ICD-10-CM | POA: Diagnosis not present

## 2018-09-04 ENCOUNTER — Other Ambulatory Visit (INDEPENDENT_AMBULATORY_CARE_PROVIDER_SITE_OTHER): Payer: Medicare Other

## 2018-09-04 ENCOUNTER — Ambulatory Visit (INDEPENDENT_AMBULATORY_CARE_PROVIDER_SITE_OTHER): Payer: Medicare Other | Admitting: Family

## 2018-09-04 ENCOUNTER — Encounter: Payer: Self-pay | Admitting: Family

## 2018-09-04 ENCOUNTER — Ambulatory Visit (INDEPENDENT_AMBULATORY_CARE_PROVIDER_SITE_OTHER)
Admission: RE | Admit: 2018-09-04 | Discharge: 2018-09-04 | Disposition: A | Discharge status: Home / Self Care | Payer: Medicare Other | Source: Ambulatory Visit | Attending: Family | Admitting: Family

## 2018-09-04 VITALS — BP 112/60 | HR 72 | Temp 98.1°F | Ht 65.4 in | Wt 225.0 lb

## 2018-09-04 DIAGNOSIS — E2839 Other primary ovarian failure: Secondary | ICD-10-CM | POA: Diagnosis not present

## 2018-09-04 DIAGNOSIS — E782 Mixed hyperlipidemia: Secondary | ICD-10-CM | POA: Diagnosis not present

## 2018-09-04 DIAGNOSIS — F419 Anxiety disorder, unspecified: Secondary | ICD-10-CM

## 2018-09-04 DIAGNOSIS — E559 Vitamin D deficiency, unspecified: Secondary | ICD-10-CM

## 2018-09-04 DIAGNOSIS — Z23 Encounter for immunization: Secondary | ICD-10-CM

## 2018-09-04 DIAGNOSIS — M8580 Other specified disorders of bone density and structure, unspecified site: Secondary | ICD-10-CM | POA: Diagnosis not present

## 2018-09-04 DIAGNOSIS — I10 Essential (primary) hypertension: Secondary | ICD-10-CM | POA: Diagnosis not present

## 2018-09-04 DIAGNOSIS — M85859 Other specified disorders of bone density and structure, unspecified thigh: Secondary | ICD-10-CM | POA: Diagnosis not present

## 2018-09-04 DIAGNOSIS — E119 Type 2 diabetes mellitus without complications: Secondary | ICD-10-CM

## 2018-09-04 DIAGNOSIS — D519 Vitamin B12 deficiency anemia, unspecified: Secondary | ICD-10-CM

## 2018-09-04 LAB — CBC WITH DIFFERENTIAL/PLATELET
BASOS ABS: 0.1 10*3/uL (ref 0.0–0.1)
Basophils Relative: 1.3 % (ref 0.0–3.0)
EOS ABS: 0.4 10*3/uL (ref 0.0–0.7)
Eosinophils Relative: 5.3 % — ABNORMAL HIGH (ref 0.0–5.0)
HEMATOCRIT: 34.5 % — AB (ref 36.0–46.0)
Hemoglobin: 11.3 g/dL — ABNORMAL LOW (ref 12.0–15.0)
LYMPHS ABS: 1.7 10*3/uL (ref 0.7–4.0)
LYMPHS PCT: 21.9 % (ref 12.0–46.0)
MCHC: 32.6 g/dL (ref 30.0–36.0)
MCV: 84.7 fl (ref 78.0–100.0)
MONOS PCT: 6.5 % (ref 3.0–12.0)
Monocytes Absolute: 0.5 10*3/uL (ref 0.1–1.0)
NEUTROS PCT: 65 % (ref 43.0–77.0)
Neutro Abs: 5 10*3/uL (ref 1.4–7.7)
Platelets: 273 10*3/uL (ref 150.0–400.0)
RBC: 4.07 Mil/uL (ref 3.87–5.11)
RDW: 14.7 % (ref 11.5–15.5)
WBC: 7.6 10*3/uL (ref 4.0–10.5)

## 2018-09-04 LAB — MICROALBUMIN / CREATININE URINE RATIO
CREATININE, U: 52.3 mg/dL
MICROALB/CREAT RATIO: 1.3 mg/g (ref 0.0–30.0)

## 2018-09-04 LAB — COMPREHENSIVE METABOLIC PANEL WITH GFR
ALT: 11 U/L (ref 0–35)
AST: 13 U/L (ref 0–37)
Albumin: 4.1 g/dL (ref 3.5–5.2)
Alkaline Phosphatase: 77 U/L (ref 39–117)
BUN: 14 mg/dL (ref 6–23)
CO2: 30 meq/L (ref 19–32)
Calcium: 9.4 mg/dL (ref 8.4–10.5)
Chloride: 101 meq/L (ref 96–112)
Creatinine, Ser: 0.72 mg/dL (ref 0.40–1.20)
GFR: 85.45 mL/min (ref >60.00)
Glucose, Bld: 133 mg/dL — ABNORMAL HIGH (ref 70–99)
Potassium: 3.7 meq/L (ref 3.5–5.1)
Sodium: 140 meq/L (ref 135–145)
Total Bilirubin: 0.3 mg/dL (ref 0.2–1.2)
Total Protein: 7.4 g/dL (ref 6.0–8.3)

## 2018-09-04 LAB — VITAMIN B12: Vitamin B-12: 198 pg/mL — ABNORMAL LOW (ref 211–911)

## 2018-09-04 LAB — LIPID PANEL
Cholesterol: 173 mg/dL (ref 0–200)
HDL: 44.7 mg/dL (ref >39.00)
NonHDL: 128.07
Total CHOL/HDL Ratio: 4
Triglycerides: 282 mg/dL — ABNORMAL HIGH (ref 0.0–149.0)
VLDL: 56.4 mg/dL — ABNORMAL HIGH (ref 0.0–40.0)

## 2018-09-04 LAB — LDL CHOLESTEROL, DIRECT: Direct LDL: 109 mg/dL

## 2018-09-04 LAB — HEMOGLOBIN A1C: Hgb A1c MFr Bld: 7.8 % — ABNORMAL HIGH (ref 4.6–6.5)

## 2018-09-04 LAB — VITAMIN D 25 HYDROXY (VIT D DEFICIENCY, FRACTURES): VITD: 16.62 ng/mL — ABNORMAL LOW (ref 30.00–100.00)

## 2018-09-04 MED ORDER — ZOSTER VAC RECOMB ADJUVANTED 50 MCG/0.5ML IM SUSR: 0.5000 mL | Freq: Once | INTRAMUSCULAR | 1 refills | Status: AC

## 2018-09-04 MED ORDER — ALPRAZOLAM 0.5 MG PO TBDP: 0.5000 mg | ORAL_TABLET | Freq: Every day | ORAL | 0 refills | Status: DC | PRN

## 2018-09-04 NOTE — Progress Notes (Signed)
Meredith [name] is a 67 y.o. female with the following history as recorded in EpicCare:  Patient Active Problem List   Diagnosis Date Noted  . B12 deficiency anemia   . Other emphysema ([place]) 02/23/2014  . 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[place]) 12/31/2013  . Essential hypertension, benign 11/05/2013  . Routine health maintenance 09/27/2012  . Vitamin D deficiency 06/20/2011  . OBESITY, CLASS II 03/18/2011  . PULMONARY NODULE 07/12/2010  . Diabetes mellitus type 2, controlled, without complications ([place]) 71/24/5809  . Hyperlipidemia 10/01/2007  . ANXIETY 10/01/2007  . Depression 10/01/2007  . OSA (obstructive sleep apnea) 10/01/2007    Current Outpatient Medications  Medication Sig Dispense Refill  . ALPRAZolam (NIRAVAM) 0.5 MG dissolvable tablet Take 1 tablet (0.5 mg total) by mouth daily as needed for anxiety (increased anxiety). 30 tablet 0  . amLODipine (NORVASC) 2.5 MG tablet Take 1 tablet (2.5 mg total) by mouth daily. 90 tablet 3  . aspirin 81 MG tablet Take 81 mg by mouth daily.      Marland Kitchen escitalopram (LEXAPRO) 20 MG tablet TAKE 1 TABLET BY MOUTH EVERY DAY 90 tablet 1  . folic acid (FOLVITE) 983 MCG tablet Take 400 mcg by mouth daily.    . furosemide (LASIX) 20 MG tablet Take 1 tablet (20 mg total) by mouth 2 (two) times daily. 180 tablet 3  . irbesartan (AVAPRO) 300 MG tablet Take 1 tablet (300 mg total) by mouth daily. Annual appt due in Sept must see provider for future refills 90 tablet 3  . metFORMIN (GLUCOPHAGE) 1000 MG tablet Take 1 tablet (1,000 mg total) by mouth 2 (two) times daily with a meal. Annual appt due in Sept must see provider for future refills 180 tablet 3  . simvastatin (ZOCOR) 40 MG tablet TAKE 1 TABLET BY MOUTH DAILY AT 6PM 90 tablet 2  . Zoster Vaccine Adjuvanted [facility]) injection Inject 0.5 mLs into the muscle once for 1 dose. 0.5 mL 1   No current facility-administered medications for this visit.     Allergies: Patient has no known allergies.  Past  Medical History:  Diagnosis Date  . Anemia   . Anxiety state, unspecified   . Arthritis    KNEES  . B12 deficiency anemia 05/2014 dx  . CIN I (cervical intraepithelial neoplasia I)   . Depressive disorder, not elsewhere classified   . Diabetes mellitus without complication ([place])   . Emphysema of lung ([place])   . Hypertension   . Internal hemorrhoids   . Other and unspecified hyperlipidemia   . Unspecified sleep apnea     Past Surgical History:  Procedure Laterality Date  . CERVICAL CERCLAGE    . CESAREAN SECTION    . COLONOSCOPY    . HEMORRHOID SURGERY    . KNEE ARTHROSCOPY Left 12/2014  . UPPER GASTROINTESTINAL ENDOSCOPY      Family History  Problem Relation Age of Onset  . Heart attack Father 68  . Diabetes Father   . Cancer Father        lung  . Heart disease Father   . Breast cancer Neg Hx     Social History   Tobacco Use  . Smoking status: Former Smoker    Packs/day: 0.50    Years: 14.00    Pack years: 7.00    Types: Cigarettes  . Smokeless tobacco: Never Used  Substance Use Topics  . Alcohol use: Yes    Alcohol/week: 5.0 - 6.0 standard drinks    Types: 5 - 6  Standard drinks or equivalent per week    Subjective:  Patient presents today for yearly follow-up on chronic care needs including: 1) Hypertension; 2) Type 2 Diabetes; 3) Hyperlipidemia 4) GAD  Review of Systems  Constitutional: Positive for weight loss.       Planned  HENT: Negative for hearing loss.   Eyes: Negative for blurred vision and pain.  Respiratory: Negative for shortness of breath and wheezing.   Cardiovascular: Negative for chest pain and palpitations.  Gastrointestinal: Negative for abdominal pain.  Genitourinary: Negative for dysuria, frequency and urgency.  Musculoskeletal: Negative for joint pain.  Neurological: Negative for headaches.  Endo/Heme/Allergies: Negative.   Psychiatric/Behavioral: The patient is nervous/anxious. The patient does not have insomnia.       Objective:  Vitals:   09/04/18 1520  BP: 112/60  Pulse: 72  Temp: 98.1 F (36.7 C)  TempSrc: Oral  SpO2: 96%  Weight: 225 lb (102.1 kg)  Height: 5' 5.4" (1.661 m)    General: Well developed, well nourished, in no acute distress  Skin : Warm and dry.  Head: Normocephalic and atraumatic  Eyes: Sclera and conjunctiva clear; pupils round and reactive to light; extraocular movements intact  Ears: External normal; canals clear; tympanic membranes normal  Oropharynx: Pink, supple. No suspicious lesions  Neck: Supple without thyromegaly, adenopathy  Lungs: Respirations unlabored; clear to auscultation bilaterally without wheeze, rales, rhonchi  CVS exam: normal rate and regular rhythm.  Abdomen: Soft; nontender; nondistended; normoactive bowel sounds; no masses or hepatosplenomegaly  Musculoskeletal: No deformities; no active joint inflammation  Extremities: No edema, cyanosis, clubbing  Vessels: Symmetric bilaterally  Neurologic: Alert and oriented; speech intact; face symmetrical; moves all extremities well; CNII-XII intact without focal deficit  Assessment:  1. Essential hypertension, benign   2. Mixed hyperlipidemia   3. Controlled type 2 diabetes mellitus without complication, without long-term current use of insulin ([name])   4. Vitamin D deficiency   5. Osteopenia, unspecified location   6. Osteopenia of neck of femur, unspecified laterality   7. Anemia due to vitamin B12 deficiency, unspecified B12 deficiency type   8. Ovarian failure   9. Anxiety   10. Need for influenza vaccination     Plan:  Labs and refills updated; refer to nutritionist per patient request; update DEXA due to history of osteopenia; Flu vaccine given; Rx for Shingrix given; follow-up in 6 months, sooner prn based on labs.  Spent 45+ minutes with patient;   No follow-ups on file.  Orders Placed This Encounter  Procedures  . DG Bone Density    Standing Status:   Future    Number of  Occurrences:   1    Standing Expiration Date:   11/05/2019    Order Specific Question:   Reason for Exam (SYMPTOM  OR DIAGNOSIS REQUIRED)    Answer:   ovarian failure    Order Specific Question:   Preferred imaging location?    Answer:   [facility]  . Flu vaccine HIGH DOSE PF  . CBC w/Diff    Standing Status:   Future    Number of Occurrences:   1    Standing Expiration Date:   09/04/2019  . Comp Met (CMET)    Standing Status:   Future    Number of Occurrences:   1    Standing Expiration Date:   09/04/2019  . Lipid panel    Standing Status:   Future    Number of Occurrences:   1    Standing  Expiration Date:   09/05/2019  . HgB A1c    Standing Status:   Future    Number of Occurrences:   1    Standing Expiration Date:   09/04/2019  . Vitamin D (25 hydroxy)    Standing Status:   Future    Number of Occurrences:   1    Standing Expiration Date:   09/04/2019  . Urine Microalbumin w/creat. ratio    Standing Status:   Future    Standing Expiration Date:   09/04/2019  . B12    Standing Status:   Future    Number of Occurrences:   1    Standing Expiration Date:   09/04/2019  . Referral to Nutrition and Diabetes Services    Referral Priority:   Routine    Referral Type:   Consultation    Referral Reason:   Patient Preference    Number of Visits Requested:   1    Requested Prescriptions   Signed Prescriptions Disp Refills  . Zoster Vaccine Adjuvanted [facility]) injection 0.5 mL 1    Sig: Inject 0.5 mLs into the muscle once for 1 dose.  Marland Kitchen ALPRAZolam (NIRAVAM) 0.5 MG dissolvable tablet 30 tablet 0    Sig: Take 1 tablet (0.5 mg total) by mouth daily as needed for anxiety (increased anxiety).

## 2018-09-07 ENCOUNTER — Other Ambulatory Visit: Payer: Self-pay | Admitting: Family

## 2018-09-07 MED ORDER — SITAGLIPTIN PHOSPHATE 100 MG PO TABS: 100.0000 mg | ORAL_TABLET | Freq: Every day | ORAL | 0 refills | Status: DC

## 2018-09-07 MED ORDER — ROSUVASTATIN CALCIUM 10 MG PO TABS: 10.0000 mg | ORAL_TABLET | Freq: Every day | ORAL | 0 refills | Status: DC

## 2018-09-08 ENCOUNTER — Telehealth: Payer: Self-pay | Admitting: Family

## 2018-09-08 MED ORDER — VITAMIN D (ERGOCALCIFEROL) 1.25 MG (50000 UNIT) PO CAPS: 50000.0000 [IU] | ORAL_CAPSULE | ORAL | 0 refills | Status: AC

## 2018-09-08 NOTE — Telephone Encounter (Signed)
My mistake- the Vitamin D has now been sent in for her. Yes, I would agree- she should not pay $1700 for Januvia; obviously that is not covered by formulary. Did her pharmacist give her other alternatives that might be covered? If not, that's fine too- I will try sending in other options until we find a reasonably priced drug to add to the Metformin.

## 2018-09-08 NOTE — Telephone Encounter (Signed)
Copied from [place] [id number]. Topic: General - Other >> Sep 08, 2018 12:46 PM Yvette Rack wrote: Reason for CRM: Pt returned call to [facility] Pt states she will not be able to to take sitaGLIPtin (JANUVIA) 100 MG tablet due to the cost is $1700 per month. Pt states she would like to know what she will be prescribed to treat her for the low Vitamin D in the first 12 weeks. Cb# [phone]

## 2018-09-08 NOTE — Telephone Encounter (Signed)
Were you going to fax in something for her Vitamin D? Also can you advise regarding the Januvia.  Thanks

## 2018-09-09 MED ORDER — LINAGLIPTIN 5 MG PO TABS: 5.0000 mg | ORAL_TABLET | Freq: Every day | ORAL | 3 refills | Status: DC

## 2018-09-09 NOTE — Telephone Encounter (Signed)
Emailed patient and info given via my-chart.

## 2018-09-09 NOTE — Addendum Note (Signed)
Addended by: Sherlene Shams on: 09/09/2018 02:47 PM   Modules accepted: Orders

## 2018-09-09 NOTE — Telephone Encounter (Signed)
Emailed patient back via my-chart with info. In the meantime did you want to go ahead and fax her in something else to take with her Metformin?

## 2018-09-09 NOTE — Telephone Encounter (Signed)
Please let her know that [facility] let us know that Meredith [name] is the preferred medication ( like the Januvia that was originally prescribed). I am going to send that in for her. Keep planned follow-up.

## 2018-09-16 ENCOUNTER — Ambulatory Visit (INDEPENDENT_AMBULATORY_CARE_PROVIDER_SITE_OTHER): Payer: Medicare Other | Admitting: Emergency Medicine

## 2018-09-16 DIAGNOSIS — D519 Vitamin B12 deficiency anemia, unspecified: Secondary | ICD-10-CM

## 2018-09-16 MED ORDER — CYANOCOBALAMIN 1000 MCG/ML IJ SOLN
1000.0000 ug | Freq: Once | INTRAMUSCULAR | Status: AC
  Administered 2018-09-16: 1000 ug via INTRAMUSCULAR

## 2018-09-19 IMAGING — DX DG CHEST 2V
2 series · 3 of 3 positions shown · non-contrast
Comparison: CT chest 07/17/2010

CLINICAL DATA: Cough, dyspnea, chest pain for 3 weeks

EXAM:
CHEST - 2 VIEW

[Series 1: chest pa · 0.14mm/px · 2 of 2 slices shown]
[im 1/2]
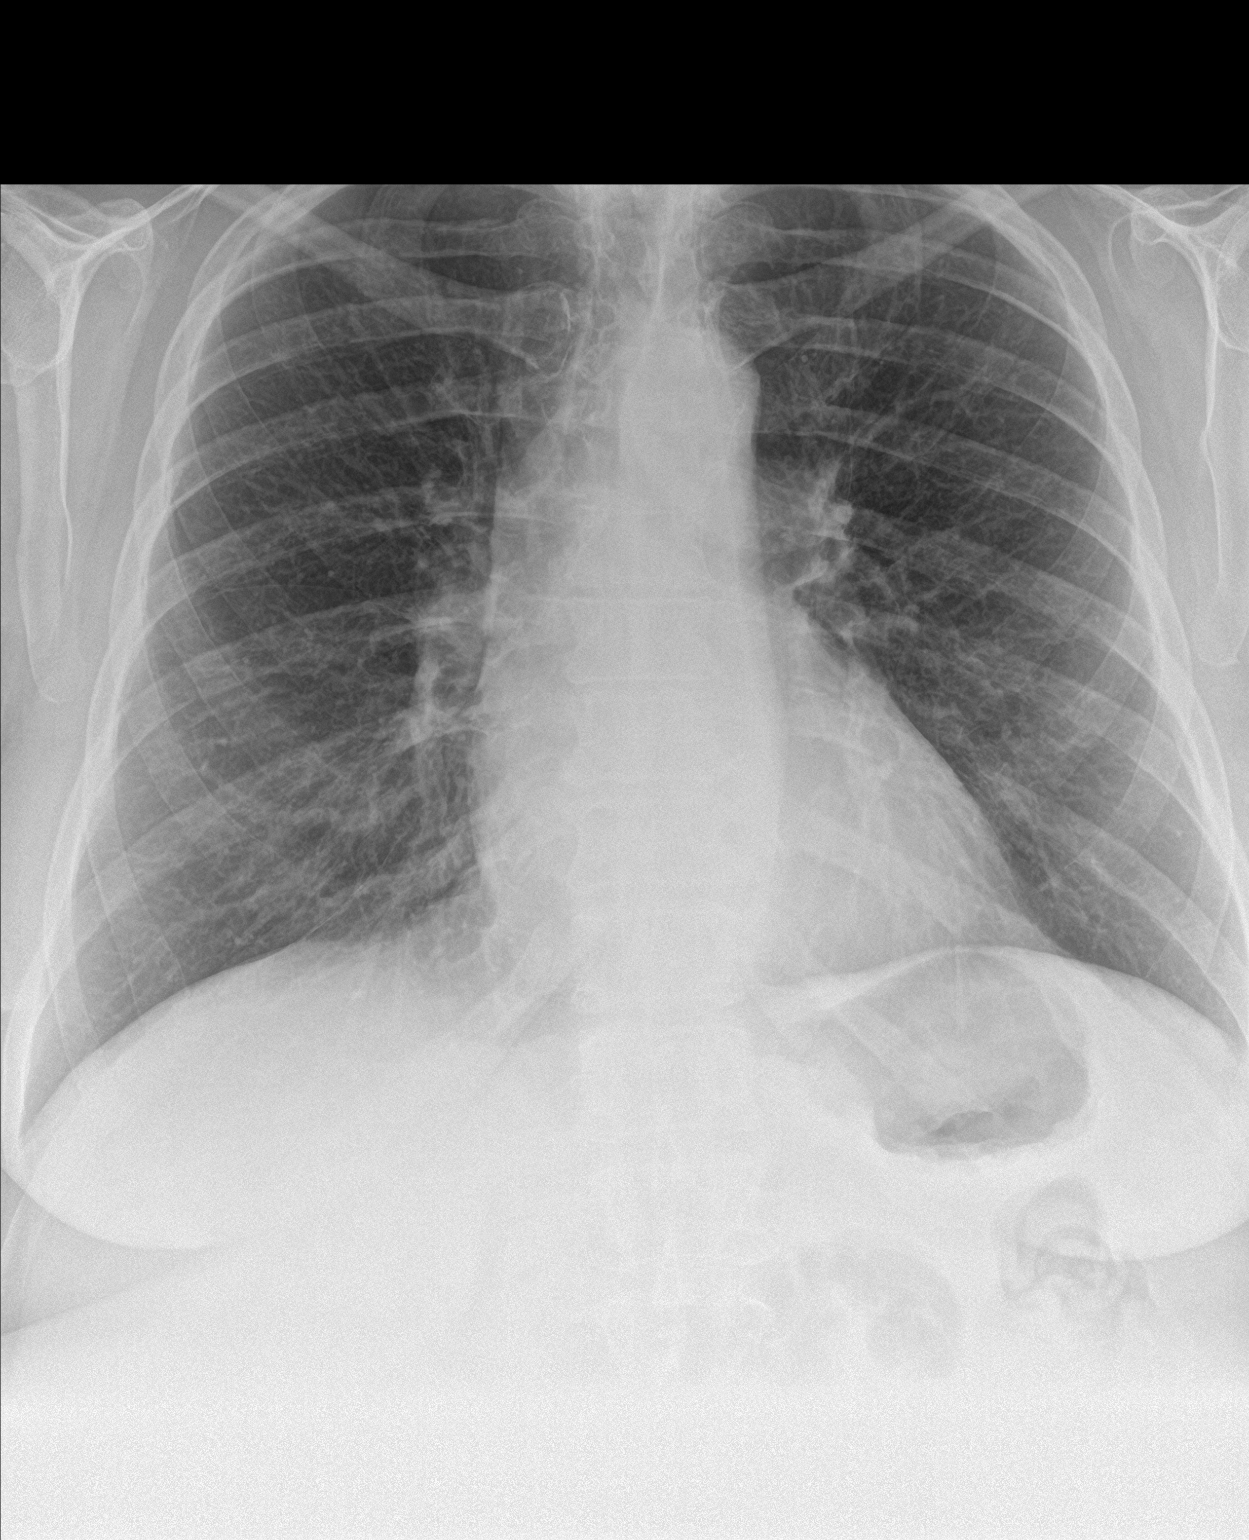
[im 2/2]
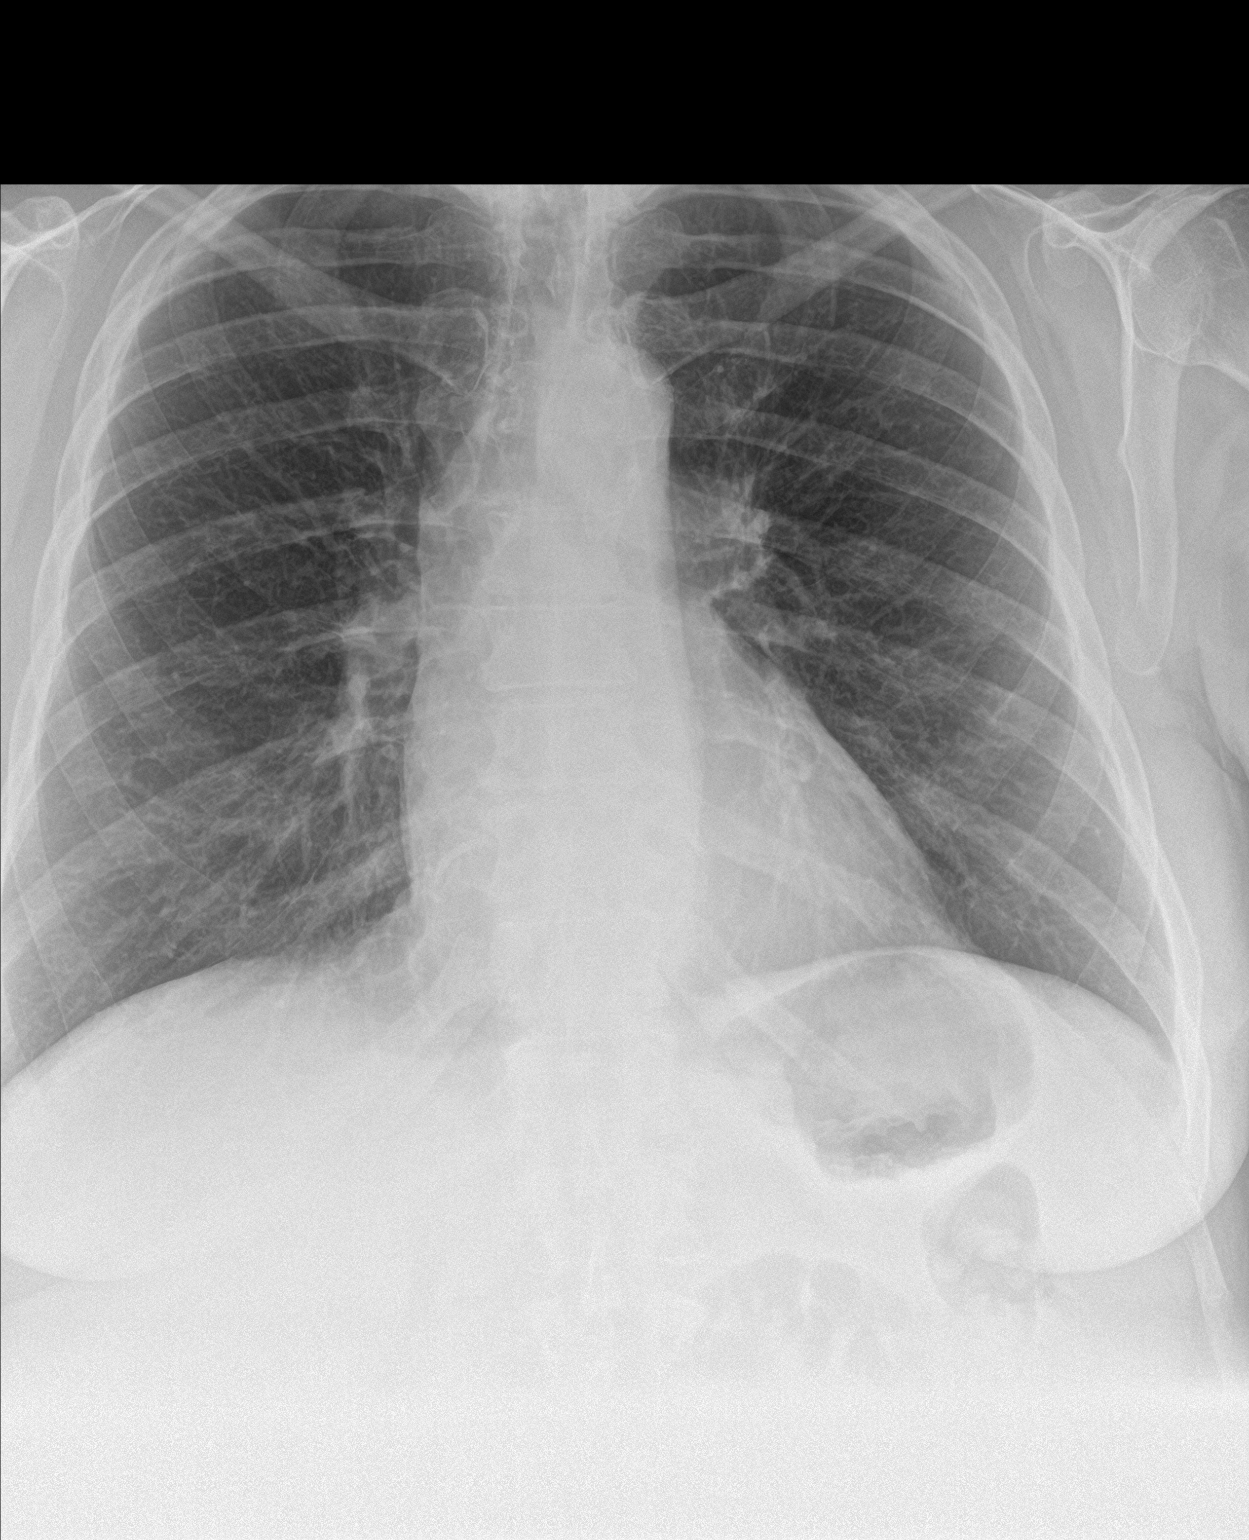

[chest lat]
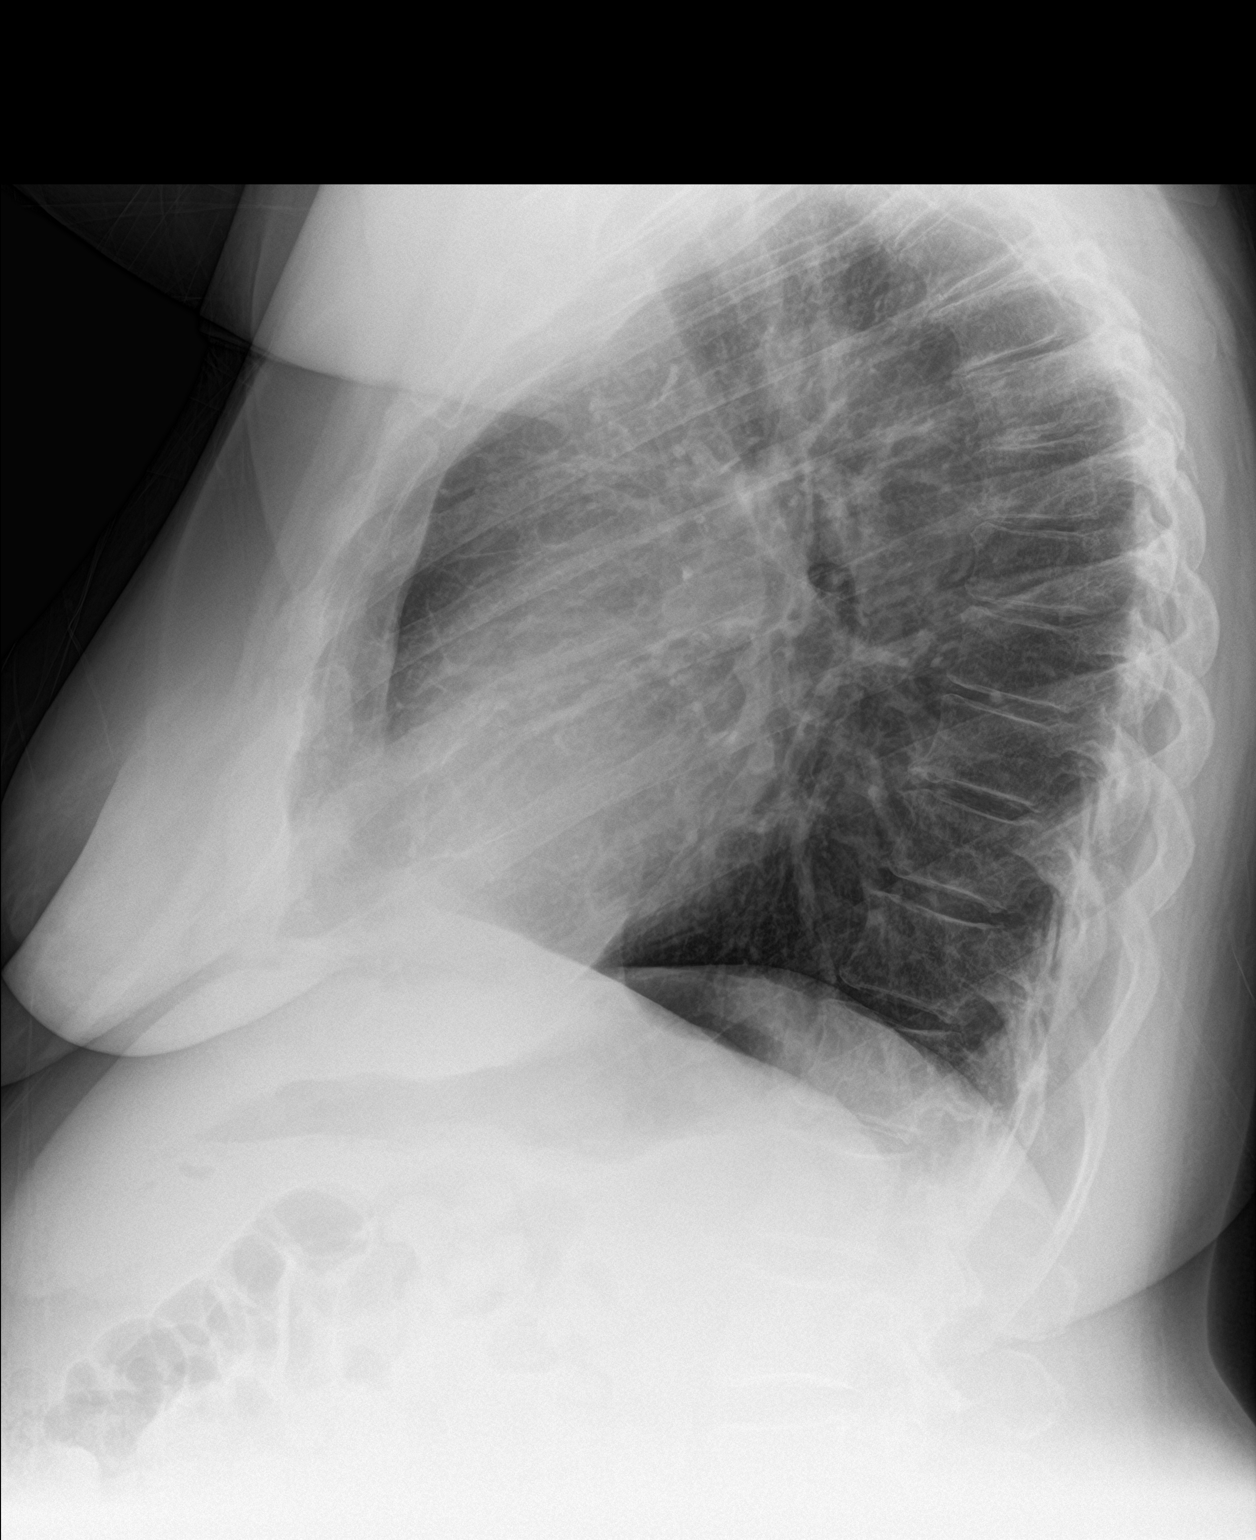

[3 of 3 positions shown; findings below may reference images not displayed]

FINDINGS: The heart size and mediastinal contours are within normal limits.
Both lungs are clear. The visualized skeletal structures are
unremarkable.
IMPRESSION: No active cardiopulmonary disease.

## 2018-09-21 ENCOUNTER — Other Ambulatory Visit: Payer: Self-pay | Admitting: Internal Medicine

## 2018-09-21 DIAGNOSIS — I10 Essential (primary) hypertension: Secondary | ICD-10-CM

## 2018-09-22 ENCOUNTER — Other Ambulatory Visit: Payer: Self-pay | Admitting: Internal Medicine

## 2018-09-22 NOTE — Telephone Encounter (Signed)
Now a [facility]ray patient. Looks like everything is due except the simvastatin she is now on rosuvastatin. Had an appointment with [place] 09/04/18 with all labs done

## 2018-09-23 ENCOUNTER — Ambulatory Visit (INDEPENDENT_AMBULATORY_CARE_PROVIDER_SITE_OTHER): Payer: Medicare Other

## 2018-09-23 DIAGNOSIS — E538 Deficiency of other specified B group vitamins: Secondary | ICD-10-CM | POA: Diagnosis not present

## 2018-09-23 MED ORDER — CYANOCOBALAMIN 1000 MCG/ML IJ SOLN
1000.0000 ug | Freq: Once | INTRAMUSCULAR | Status: AC
  Administered 2018-09-23: 1000 ug via INTRAMUSCULAR

## 2018-09-30 ENCOUNTER — Ambulatory Visit (INDEPENDENT_AMBULATORY_CARE_PROVIDER_SITE_OTHER): Payer: Medicare Other

## 2018-09-30 DIAGNOSIS — E538 Deficiency of other specified B group vitamins: Secondary | ICD-10-CM | POA: Diagnosis not present

## 2018-09-30 MED ORDER — CYANOCOBALAMIN 1000 MCG/ML IJ SOLN
1000.0000 ug | Freq: Once | INTRAMUSCULAR | Status: AC
  Administered 2018-09-30: 1000 ug via INTRAMUSCULAR

## 2018-10-05 DIAGNOSIS — Z9842 Cataract extraction status, left eye: Secondary | ICD-10-CM | POA: Diagnosis not present

## 2018-10-05 DIAGNOSIS — H1859 Other hereditary corneal dystrophies: Secondary | ICD-10-CM | POA: Diagnosis not present

## 2018-10-08 ENCOUNTER — Ambulatory Visit (INDEPENDENT_AMBULATORY_CARE_PROVIDER_SITE_OTHER): Payer: Medicare Other | Admitting: *Deleted

## 2018-10-08 DIAGNOSIS — E538 Deficiency of other specified B group vitamins: Secondary | ICD-10-CM

## 2018-10-08 MED ORDER — CYANOCOBALAMIN 1000 MCG/ML IJ SOLN
1000.0000 ug | Freq: Once | INTRAMUSCULAR | Status: AC
  Administered 2018-10-08: 1000 ug via INTRAMUSCULAR

## 2018-11-02 ENCOUNTER — Encounter: Payer: Self-pay | Admitting: Family

## 2018-11-04 DIAGNOSIS — H1851 Endothelial corneal dystrophy: Secondary | ICD-10-CM | POA: Diagnosis not present

## 2018-11-04 DIAGNOSIS — H40031 Anatomical narrow angle, right eye: Secondary | ICD-10-CM | POA: Diagnosis not present

## 2018-11-04 DIAGNOSIS — H40013 Open angle with borderline findings, low risk, bilateral: Secondary | ICD-10-CM | POA: Diagnosis not present

## 2018-11-04 DIAGNOSIS — H04123 Dry eye syndrome of bilateral lacrimal glands: Secondary | ICD-10-CM | POA: Diagnosis not present

## 2018-11-11 DIAGNOSIS — L400 Psoriasis vulgaris: Secondary | ICD-10-CM | POA: Diagnosis not present

## 2018-11-20 ENCOUNTER — Encounter: Payer: Medicare Other | Attending: Family | Admitting: *Deleted

## 2018-11-20 DIAGNOSIS — E119 Type 2 diabetes mellitus without complications: Secondary | ICD-10-CM | POA: Diagnosis not present

## 2018-11-20 NOTE — Progress Notes (Signed)
Diabetes Self-Management Education  Visit Type: First/Initial  Appt. Start Time: 1015 Appt. End Time: 1045  11/20/2018  Meredith [name], identified by name and date of birth, is a 67 y.o. female with a diagnosis of Diabetes: Type 2. Patient works as substance Physicist, medical, full time. She did not realize how long this appointment was going to be and cannot stay but for 30 minutes. We agreed to do assessment today and save the education for when she comes back to complete the visit.  Diet history and activity habits noted. She states she has a meter but is not testing currently.   ASSESSMENT  Last menstrual period 12/30/1998. There is no height or weight on file to calculate BMI.  Diabetes Self-Management Education - 11/20/18 1028      Visit Information   Visit Type  First/Initial      Initial Visit   Diabetes Type  Type 2    Are you currently following a meal plan?  No    Are you taking your medications as prescribed?  Yes    Date Diagnosed  unsure      Health Coping   How would you rate your overall health?  Good      Psychosocial Assessment   Patient Belief/Attitude about Diabetes  Motivated to manage diabetes    Self-care barriers  None    Other persons present  Patient    Patient Concerns  Nutrition/Meal planning;Weight Control;Glycemic Control    Special Needs  None    Preferred Learning Style  No preference indicated    Learning Readiness  Contemplating    How often do you need to have someone help you when you read instructions, pamphlets, or other written materials from your doctor or pharmacy?  1 - Never    What is the last grade level you completed in school?  BS      Pre-Education Assessment   Patient understands the diabetes disease and treatment process.  Needs Review    Patient understands incorporating nutritional management into lifestyle.  Needs Instruction    Patient undertands incorporating physical activity into lifestyle.  Needs Instruction    Patient  understands using medications safely.  Needs Review    Patient understands monitoring blood glucose, interpreting and using results  Needs Instruction    Patient understands prevention, detection, and treatment of acute complications.  Needs Instruction    Patient understands prevention, detection, and treatment of chronic complications.  Needs Review    Patient understands how to develop strategies to address psychosocial issues.  Needs Review    Patient understands how to develop strategies to promote health/change behavior.  Needs Review      Complications   Last HgB A1C per patient/outside source  7.8 %    How often do you check your blood sugar?  0 times/day (not testing)    Have you had a dilated eye exam in the past 12 months?  Yes    Have you had a dental exam in the past 12 months?  Yes    Are you checking your feet?  No      Dietary Intake   Breakfast  10 AM - ish a pack of nabs at desk    Lunch  buys daily; sit down restaurant @ pizza and salad OR [place] OR breakfast at lunch OR [employer] soup and salad    Snack (afternoon)  chewing gum or 1 piece candy    Dinner  baby bell cheese with fresh fruit (  apple) occasionally with yogurt OR restaurant meal with a friend    Snack (evening)  no    Beverage(s)  diet coke, unsweet tea, wine when eating out at dinner (0-2 times a week)      Exercise   Exercise Type  ADL's   house cleaning   How many days per week to you exercise?  0    How many minutes per day do you exercise?  0    Total minutes per week of exercise  0      Patient Education   Previous Diabetes Education  No      Outcomes   Expected Outcomes  Other (comment)   Patient did not allow enough time for the whole appointment. We will do initial assessment today and plan education at next follow up visit.   Future DMSE  2 wks    Program Status  Not Completed       Individualized Plan for Diabetes Self-Management Training:   Learning Objective:  Patient will have a  greater understanding of diabetes self-management. Patient education plan is to attend individual and/or group sessions per assessed needs and concerns.   Plan:   Patient Instructions  Plan:  Consider some ways to increase your activity level, perhaps with arm chair exercises until we speak again  Consider checking BG at alternate times of day and record for [place] to review when you come back.  Expected Outcomes:  Other (comment)(Patient did not allow enough time for the whole appointment. We will do initial assessment today and plan education at next follow up visit.)  Education material provided: A1C conversion sheet, Diabetes Medication handout, Arm Chair Exercise handout.   If problems or questions, patient to contact team via:  Phone  Future DSME appointment: 2 wks

## 2018-11-20 NOTE — Patient Instructions (Signed)
Plan:  Consider some ways to increase your activity level, perhaps with arm chair exercises until we speak again  Consider checking BG at alternate times of day and record for [place] to review when you come back.

## 2018-12-09 ENCOUNTER — Other Ambulatory Visit: Payer: Self-pay | Admitting: Family

## 2018-12-09 NOTE — Telephone Encounter (Signed)
Please remind her she is due for OV; I did send in refill on the Crestor for her.

## 2018-12-11 ENCOUNTER — Ambulatory Visit: Payer: Medicare Other | Admitting: *Deleted

## 2018-12-20 ENCOUNTER — Other Ambulatory Visit: Payer: Self-pay | Admitting: Family

## 2018-12-20 DIAGNOSIS — I10 Essential (primary) hypertension: Secondary | ICD-10-CM

## 2019-01-20 ENCOUNTER — Other Ambulatory Visit: Payer: Self-pay | Admitting: Family

## 2019-01-20 MED ORDER — ESCITALOPRAM OXALATE 20 MG PO TABS: 20.0000 mg | ORAL_TABLET | Freq: Every day | ORAL | 0 refills | Status: DC

## 2019-01-20 NOTE — Telephone Encounter (Signed)
Copied from [place] [id number]. Topic: Quick Communication - Rx Refill/Question >> [place] 22, 2020  4:20 PM Sheran Luz wrote: Medication: escitalopram (LEXAPRO) 20 MG tablet   Patient is requesting a refill of this medication.   Has the patient contacted their pharmacy? Yes, patient was advised to contact pharmacy   Preferred Pharmacy (with phone number or street name):[id number] [place], [place] [phone] (Phone) [phone] (Fax)

## 2019-01-22 ENCOUNTER — Other Ambulatory Visit: Payer: Self-pay | Admitting: Family

## 2019-01-22 ENCOUNTER — Telehealth: Payer: Self-pay | Admitting: Family

## 2019-01-22 MED ORDER — LINAGLIPTIN 5 MG PO TABS: 5.0000 mg | ORAL_TABLET | Freq: Every day | ORAL | 0 refills | Status: DC

## 2019-01-22 NOTE — Telephone Encounter (Signed)
Appt due in March Requested Prescriptions  Pending Prescriptions Disp Refills  . linagliptin (TRADJENTA) 5 MG TABS tablet 90 tablet 0    Sig: Take 1 tablet (5 mg total) by mouth daily.     Endocrinology:  Diabetes - DPP-4 Inhibitors - linagliptin Passed - 01/22/2019  1:54 PM      Passed - HBA1C is between 0 and 7.9 and within 180 days    Hgb A1c MFr Bld  Date Value Ref Range Status  09/04/2018 7.8 (H) 4.6 - 6.5 % Final    Comment:    Glycemic Control Guidelines for People with Diabetes:Non Diabetic:  <6%Goal of Therapy: <7%Additional Action Suggested:  >8%          Passed - Valid encounter within last 6 months    Recent Outpatient Visits          4 months ago Essential hypertension, benign   [place], Marvis Repress, [place]   7 months ago Acute bacterial bronchitis   [place], Crosby Oyster, DO   8 months ago Persistent cough for 3 weeks or longer   [place], Marvis Repress, [place]   10 months ago Other emphysema [facility])   [place], Mortimer Fries, MD   11 months ago Acute non-recurrent maxillary sinusitis   Primary Care at [place], [place]

## 2019-01-22 NOTE — Telephone Encounter (Signed)
Copied from [place] [id number]. Topic: Quick Communication - Rx Refill/Question >> [place] 24, 2020  1:43 PM Virl Axe D wrote: Medication: linagliptin (TRADJENTA) 5 MG TABS tablet  Has the patient contacted their pharmacy? Yes.   (Agent: If no, request that the patient contact the pharmacy for the refill.) (Agent: If yes, when and what did the pharmacy advise?)  Preferred Pharmacy (with phone number or street name): [id number] [place], [place] [phone] (Phone) [phone] (Fax)  Agent: Please be advised that RX refills may take up to 3 business days. We ask that you follow-up with your pharmacy.

## 2019-01-22 NOTE — Telephone Encounter (Signed)
Duplicate encounter

## 2019-01-22 NOTE — Telephone Encounter (Signed)
Copied from [place] [id number]. Topic: Quick Communication - Rx Refill/Question >> [place] 24, 2020  1:42 PM Bea Graff, NT wrote: Medication: linagliptin (TRADJENTA) 5 MG TABS tablet  Has the patient contacted their pharmacy? Yes.   (Agent: If no, request that the patient contact the pharmacy for the refill.) (Agent: If yes, when and what did the pharmacy advise?)  Preferred Pharmacy (with phone number or street name): [id number] [place], [place] [phone] (Phone) [phone] (Fax)    Agent: Please be advised that RX refills may take up to 3 business days. We ask that you follow-up with your pharmacy.

## 2019-02-08 DIAGNOSIS — Z961 Presence of intraocular lens: Secondary | ICD-10-CM | POA: Diagnosis not present

## 2019-02-08 DIAGNOSIS — H1859 Other hereditary corneal dystrophies: Secondary | ICD-10-CM | POA: Diagnosis not present

## 2019-02-08 DIAGNOSIS — Z9842 Cataract extraction status, left eye: Secondary | ICD-10-CM | POA: Diagnosis not present

## 2019-02-18 NOTE — Progress Notes (Deleted)
68 y.o. G50P3004 Divorced Caucasian female here for annual exam.    PCP:     Patient's last menstrual period was 12/30/1998 (approximate).           Sexually active: {yes no:314532}  The current method of family planning is post menopausal status.    Exercising: {yes NL:892119}  {types:19826} Smoker:  Former  Health Maintenance: Pap: 02-11-18 Neg:Neg HR HPV, 05-14-17 Neg:Neg HR HPV History of abnormal Pap:  Yes, pap 10-23-16 Neg:Neg HR HPV-but cervix felt and looked different on exam;colpo 01-06-17 showed granulation tissue with atypia. Pap 08/28/15 showed normal cells and positive HR HPV. Status post colposcopy - LGSIL 09/20/15. Colposcopy unsatisfactory.  Inflammation of vagina noted after procedure on 09/27/15. Dr. Denman George consultation on 10/16/15 for cervix flush with vagina and unsatisfactory colpo. Pap and HR HPV recommended in one year due to concordance of pap and colpo bx. Recommended hysterectomy only if needed more clear diagnosis or work up for HGSIL pap or biopsy. LEEP and conization not recommended due to cervix flush with cervix. History of possible DES exposure noted at that visit. MMG:  ***01-28-18 3D Neg/density c/BiRads1 Colonoscopy:  ***01-02-14 benign polyp;next 2020 or 2025 BMD: 09-04-18  Result :Osteopenia TDaP:  2013 Gardasil:   no ERD:EYCXKG Hep C: 02-27-16 Neg Screening Labs:  Hb today: ***, Urine today: ***   reports that she has quit smoking. Her smoking use included cigarettes. She has a 7.00 pack-year smoking history. She has never used smokeless tobacco. She reports current alcohol use of about 5.0 - 6.0 standard drinks of alcohol per week. She reports that she does not use drugs.  Past Medical History:  Diagnosis Date  . Anemia   . Anxiety state, unspecified   . Arthritis    KNEES  . B12 deficiency anemia 05/2014 dx  . CIN I (cervical intraepithelial neoplasia I)   . Depressive disorder, not elsewhere classified   . Diabetes mellitus without complication  ([place])   . Emphysema of lung ([place])   . Hypertension   . Internal hemorrhoids   . Other and unspecified hyperlipidemia   . Unspecified sleep apnea     Past Surgical History:  Procedure Laterality Date  . CERVICAL CERCLAGE    . CESAREAN SECTION    . COLONOSCOPY    . HEMORRHOID SURGERY    . KNEE ARTHROSCOPY Left 12/2014  . UPPER GASTROINTESTINAL ENDOSCOPY      Current Outpatient Medications  Medication Sig Dispense Refill  . ALPRAZolam (NIRAVAM) 0.5 MG dissolvable tablet Take 1 tablet (0.5 mg total) by mouth daily as needed for anxiety (increased anxiety). 30 tablet 0  . amLODipine (NORVASC) 2.5 MG tablet TAKE 1 TABLET(2.5 MG) BY MOUTH DAILY 90 tablet 0  . aspirin 81 MG tablet Take 81 mg by mouth daily.      Marland Kitchen escitalopram (LEXAPRO) 20 MG tablet Take 1 tablet (20 mg total) by mouth daily. 90 tablet 0  . folic acid (FOLVITE) 818 MCG tablet Take 400 mcg by mouth daily.    . furosemide (LASIX) 20 MG tablet TAKE 1 TABLET BY MOUTH TWICE DAILY 180 tablet 0  . irbesartan (AVAPRO) 300 MG tablet TAKE 1 TABLET BY MOUTH EVERY DAY 90 tablet 0  . linagliptin (TRADJENTA) 5 MG TABS tablet Take 1 tablet (5 mg total) by mouth daily. 30 tablet 0  . metFORMIN (GLUCOPHAGE) 1000 MG tablet TAKE 1 TABLET BY MOUTH TWICE DAILY WITH A MEAL 180 tablet 0  . rosuvastatin (CRESTOR) 10 MG tablet TAKE 1 TABLET(10 MG)  BY MOUTH DAILY 90 tablet 0   No current facility-administered medications for this visit.     Family History  Problem Relation Age of Onset  . Heart attack Father 55  . Diabetes Father   . Cancer Father        lung  . Heart disease Father   . Breast cancer Neg Hx     Review of Systems  Exam:   LMP 12/30/1998 (Approximate)     General appearance: alert, cooperative and appears stated age Head: Normocephalic, without obvious abnormality, atraumatic Neck: no adenopathy, supple, symmetrical, trachea midline and thyroid normal to inspection and palpation Lungs: clear to auscultation  bilaterally Breasts: normal appearance, no masses or tenderness, No nipple retraction or dimpling, No nipple discharge or bleeding, No axillary or supraclavicular adenopathy Heart: regular rate and rhythm Abdomen: soft, non-tender; no masses, no organomegaly Extremities: extremities normal, atraumatic, no cyanosis or edema Skin: Skin color, texture, turgor normal. No rashes or lesions Lymph nodes: Cervical, supraclavicular, and axillary nodes normal. No abnormal inguinal nodes palpated Neurologic: Grossly normal  Pelvic: External genitalia:  no lesions              Urethra:  normal appearing urethra with no masses, tenderness or lesions              Bartholins and Skenes: normal                 Vagina: normal appearing vagina with normal color and discharge, no lesions              Cervix: no lesions              Pap taken: {yes no:314532} Bimanual Exam:  Uterus:  normal size, contour, position, consistency, mobility, non-tender              Adnexa: no mass, fullness, tenderness              Rectal exam: {yes no:314532}.  Confirms.              Anus:  normal sphincter tone, no lesions  Chaperone was present for exam.  Assessment:   Well woman visit with normal exam.   Plan: Mammogram screening. Recommended self breast awareness. Pap and HR HPV as above. Guidelines for Calcium, Vitamin D, regular exercise program including cardiovascular and weight bearing exercise.   Follow up annually and prn.   Additional counseling given.  {yes [id number]. _______ minutes face to face time of which over 50% was spent in counseling.    After visit summary provided.

## 2019-02-19 ENCOUNTER — Ambulatory Visit: Payer: Medicare Other | Admitting: Obstetrics and Gynecology

## 2019-02-19 NOTE — Progress Notes (Signed)
68 y.o. G40P3004 Divorced Caucasian female here for annual exam.    No vaginal bleeding, no discharge, no bladder or bowel control issues.   DM under control. A1C is 7.7.  Not SA for many years.   Has a 57 month old grand daughter.   PCP: Pricilla Holm, MD    Patient's last menstrual period was 12/30/1998 (approximate).          Sexually active: No.  The current method of family planning is post menopausal status.    Exercising: No.  The patient does not participate in regular exercise at present. Smoker:  Former  Health Maintenance: Pap: 02-11-18 Neg:Neg HR HPV, 05-14-17 Neg:Neg HR HPV History of abnormal Pap:  Yes, 10-23-16 Neg:Neg HR HPV--colpo 01-06-17 done because cervix felt and looked different on exam--bx showed granulation tissue with atypia. Pap 08/28/15 showed normal cells and positive HR HPV. Status post colposcopy - LGSIL 09/20/15. Colposcopy unsatisfactory.  Inflammation of vagina noted after procedure on 09/27/15. Dr. Denman George consultation on 10/16/15 for cervix flush with vagina and unsatisfactory colpo. Pap and HR HPV recommended in one year due to concordance of pap and colpo bx. Recommended hysterectomy only if needed more clear diagnosis or work up for HGSIL pap or biopsy. LEEP and conization not recommended due to cervix flush with cervix. History of possible DES exposure noted at that visit.  MMG: 01-28-18 3D Neg/density C/BiRads1 Colonoscopy:  12-02-14 benign polyp Dr. Ulyses Southward 2020 or 2025 BMD: 09-04-18  Result :Osteopenia.  Ordered through PCP.  Low fracture risk by FRAX. TDaP:  2013 Gardasil:   no HIV: Unsure Hep C: 02-27-16 Neg Screening Labs:  Hb today: PCP    reports that she has quit smoking. Her smoking use included cigarettes. She has a 7.00 pack-year smoking history. She has never used smokeless tobacco. She reports current alcohol use of about 5.0 - 6.0 standard drinks of alcohol per week. She reports that she does not use drugs.  Past Medical  History:  Diagnosis Date  . Anemia   . Anxiety state, unspecified   . Arthritis    KNEES  . B12 deficiency anemia 05/2014 dx  . CIN I (cervical intraepithelial neoplasia I)   . Depressive disorder, not elsewhere classified   . Diabetes mellitus without complication ([place])   . Emphysema of lung ([place])   . Hypertension   . Internal hemorrhoids   . Other and unspecified hyperlipidemia   . Unspecified sleep apnea     Past Surgical History:  Procedure Laterality Date  . CERVICAL CERCLAGE    . CESAREAN SECTION    . COLONOSCOPY    . HEMORRHOID SURGERY    . KNEE ARTHROSCOPY Left 12/2014  . UPPER GASTROINTESTINAL ENDOSCOPY      Current Outpatient Medications  Medication Sig Dispense Refill  . ALPRAZolam (NIRAVAM) 0.5 MG dissolvable tablet Take 1 tablet (0.5 mg total) by mouth daily as needed for anxiety (increased anxiety). 30 tablet 0  . amLODipine (NORVASC) 2.5 MG tablet TAKE 1 TABLET(2.5 MG) BY MOUTH DAILY 90 tablet 0  . aspirin 81 MG tablet Take 81 mg by mouth daily.      Marland Kitchen augmented betamethasone dipropionate (DIPROLENE-AF) 0.05 % cream Apply 1 application topically as needed.  6  . escitalopram (LEXAPRO) 20 MG tablet Take 1 tablet (20 mg total) by mouth daily. 90 tablet 0  . folic acid (FOLVITE) 737 MCG tablet Take 400 mcg by mouth daily.    . furosemide (LASIX) 20 MG tablet TAKE 1 TABLET BY MOUTH  TWICE DAILY 180 tablet 0  . irbesartan (AVAPRO) 300 MG tablet TAKE 1 TABLET BY MOUTH EVERY DAY 90 tablet 0  . linagliptin (TRADJENTA) 5 MG TABS tablet Take 1 tablet (5 mg total) by mouth daily. 30 tablet 0  . metFORMIN (GLUCOPHAGE) 1000 MG tablet TAKE 1 TABLET BY MOUTH TWICE DAILY WITH A MEAL 180 tablet 0  . moxifloxacin (VIGAMOX) 0.5 % ophthalmic solution Place 1 drop into the right eye daily.    . prednisoLONE acetate (PRED FORTE) 1 % ophthalmic suspension Place 1 drop into the right eye daily.    . rosuvastatin (CRESTOR) 10 MG tablet TAKE 1 TABLET(10 MG) BY MOUTH DAILY 90 tablet 0    No current facility-administered medications for this visit.     Family History  Problem Relation Age of Onset  . Heart attack Father 61  . Diabetes Father   . Cancer Father        lung  . Heart disease Father   . Breast cancer Neg Hx     Review of Systems  Skin:       Hair loss  All other systems reviewed and are negative.   Exam:   BP 124/62 (BP Location: Right Arm, Patient Position: Sitting, Cuff Size: Large)   Pulse 76   Resp 16   Ht 5\' 5"  (1.651 m)   Wt 217 lb 9.6 oz (98.7 kg)   LMP 12/30/1998 (Approximate)   BMI 36.21 kg/m     General appearance: alert, cooperative and appears stated age Head: Normocephalic, without obvious abnormality, atraumatic Neck: no adenopathy, supple, symmetrical, trachea midline and thyroid normal to inspection and palpation Lungs: clear to auscultation bilaterally Breasts: normal appearance, no masses or tenderness, No nipple retraction or dimpling, No nipple discharge or bleeding, No axillary or supraclavicular adenopathy Heart: regular rate and rhythm Abdomen: soft, non-tender; no masses, no organomegaly Extremities: extremities normal, atraumatic, no cyanosis or edema Skin: Skin color, texture, turgor normal. No rashes or lesions Lymph nodes: Cervical, supraclavicular, and axillary nodes normal. No abnormal inguinal nodes palpated Neurologic: Grossly normal  Pelvic: External genitalia:  no lesions              Urethra:  normal appearing urethra with no masses, tenderness or lesions              Bartholins and Skenes: normal                 Vagina: normal appearing vagina with normal color and discharge, no lesions              Cervix: no lesions.   Cervix flush with vagina.               Pap taken: Yes.   Bimanual Exam:  Uterus:  normal size, contour, position, consistency, mobility, non-tender              Adnexa: no mass, fullness, tenderness              Rectal exam: Yes.  .  Confirms.              Anus:  normal sphincter  tone, no lesions  Chaperone was present for exam.  Assessment:   Well woman visit with normal exam. Hx cervical lesion.Atypia on colpo biopsy in [place]. 2018. Hx prior LGSIL on colposcopy 2016. Had positive HR HPV then. Hx potential DES exposure. Hx preterm deliveries.  Cervix flush with vagina. Osteopenia.   Plan: Mammogram screening.  She will schedule.  Recommended self breast awareness. Pap and HR HPV as above. Guidelines for Calcium, Vitamin D, regular exercise program including cardiovascular and weight bearing exercise. BMD next year.  Labs with PCP. Follow up annually and prn.   After visit summary provided.

## 2019-02-22 ENCOUNTER — Ambulatory Visit (INDEPENDENT_AMBULATORY_CARE_PROVIDER_SITE_OTHER): Payer: Medicare Other | Admitting: Obstetrics and Gynecology

## 2019-02-22 ENCOUNTER — Other Ambulatory Visit: Payer: Self-pay

## 2019-02-22 ENCOUNTER — Other Ambulatory Visit (HOSPITAL_COMMUNITY)
Admission: RE | Admit: 2019-02-22 | Discharge: 2019-02-22 | Disposition: A | Discharge status: Home / Self Care | Payer: Medicare Other | Source: Ambulatory Visit | Attending: Obstetrics and Gynecology | Admitting: Obstetrics and Gynecology

## 2019-02-22 ENCOUNTER — Encounter: Payer: Self-pay | Admitting: Obstetrics and Gynecology

## 2019-02-22 VITALS — BP 124/62 | HR 76 | Resp 16 | Ht 65.0 in | Wt 217.6 lb

## 2019-02-22 DIAGNOSIS — Z124 Encounter for screening for malignant neoplasm of cervix: Secondary | ICD-10-CM

## 2019-02-22 DIAGNOSIS — Z01419 Encounter for gynecological examination (general) (routine) without abnormal findings: Secondary | ICD-10-CM | POA: Insufficient documentation

## 2019-02-22 NOTE — Patient Instructions (Signed)

## 2019-02-23 LAB — CYTOLOGY - PAP
Diagnosis: NEGATIVE
HPV: NOT DETECTED

## 2019-03-09 ENCOUNTER — Other Ambulatory Visit: Payer: Self-pay | Admitting: Family

## 2019-03-09 DIAGNOSIS — Z1231 Encounter for screening mammogram for malignant neoplasm of breast: Secondary | ICD-10-CM

## 2019-03-19 ENCOUNTER — Other Ambulatory Visit: Payer: Self-pay

## 2019-03-19 ENCOUNTER — Ambulatory Visit (INDEPENDENT_AMBULATORY_CARE_PROVIDER_SITE_OTHER): Payer: Self-pay

## 2019-03-19 ENCOUNTER — Ambulatory Visit (INDEPENDENT_AMBULATORY_CARE_PROVIDER_SITE_OTHER): Payer: Medicare Other

## 2019-03-19 ENCOUNTER — Ambulatory Visit (INDEPENDENT_AMBULATORY_CARE_PROVIDER_SITE_OTHER): Payer: Medicare Other | Admitting: Orthopaedic Surgery

## 2019-03-19 ENCOUNTER — Encounter (INDEPENDENT_AMBULATORY_CARE_PROVIDER_SITE_OTHER): Payer: Self-pay | Admitting: Orthopaedic Surgery

## 2019-03-19 VITALS — Ht 65.0 in | Wt 217.0 lb

## 2019-03-19 DIAGNOSIS — M170 Bilateral primary osteoarthritis of knee: Secondary | ICD-10-CM

## 2019-03-19 DIAGNOSIS — M25562 Pain in left knee: Secondary | ICD-10-CM | POA: Diagnosis not present

## 2019-03-19 DIAGNOSIS — M25561 Pain in right knee: Secondary | ICD-10-CM

## 2019-03-19 NOTE — Progress Notes (Signed)
Office Visit Note   Patient: Meredith [name]           Date of Birth: 1951-01-23           MRN: [id number] Visit Date: 03/19/2019              Requested by: Marrian Salvage, [place], [place] [id number] PCP: Marrian Salvage, FNP   Assessment & Plan: Visit Diagnoses:  1. Acute pain of both knees   2. Bilateral primary osteoarthritis of knee     Plan: Bilateral knee contusions post fall with subcutaneous ecchymosis.  Conservative treatment recommended she can return in several months whenever she decides she is ready to consider total knee arthroplasty.  Currently she is waiting for the pandemic to resolve.  X-ray results reviewed with images and findings.  Follow-Up Instructions: No follow-ups on file.   Orders:  Orders Placed This Encounter  Procedures  . XR KNEE 3 VIEW LEFT  . XR KNEE 3 VIEW RIGHT   No orders of the defined types were placed in this encounter.     Procedures: No procedures performed   Clinical Data: No additional findings.   Subjective: Chief Complaint  Patient presents with  . Left Knee - Pain  . Right Knee - Pain    HPI 68 year old female had a fall when she tripped in a parking deck falling straight forward onto both knees.  She has had significant bruising with still residual bruising on the left leg extending from the tibial tubercle down to the ankle.  She has prepatellar and pretibial tubercle bursa resolving hematoma with subcutaneous swelling.  She has been able to ambulate.  She has had known bilateral knee osteoarthritis moderate to severe but has been deferring total knee arthroplasty for several years.  Patient denies chills or fever no locking.  Review of Systems view of system positive for type 2 diabetes controlled hyperlipidemia anxiety depression pulmonary nodule obesity with BMI 36 vitamin D deficiency hypertension.   Objective: Vital Signs: Ht 5\' 5"  (1.651 m)   Wt 217 lb (98.4 kg)   LMP 12/30/1998  (Approximate)   BMI 36.11 kg/m   Physical Exam Constitutional:      Appearance: She is well-developed.  HENT:     Head: Normocephalic.     Right Ear: External ear normal.     Left Ear: External ear normal.  Eyes:     Pupils: Pupils are equal, round, and reactive to light.  Neck:     Thyroid: No thyromegaly.     Trachea: No tracheal deviation.  Cardiovascular:     Rate and Rhythm: Normal rate.  Pulmonary:     Effort: Pulmonary effort is normal.  Abdominal:     Palpations: Abdomen is soft.  Skin:    General: Skin is warm and dry.  Neurological:     Mental Status: She is alert and oriented to person, place, and time.  Psychiatric:        Behavior: Behavior normal.     Ortho Exam patient has crepitus with knee range of motion.  She is able to get from sitting to standing without using her hands.  Ecchymosis extend from the patella on the left knee down to the ankle various shades of color.  The patellar tendon is intact crepitus with knee extension. Specialty Comments:  No specialty comments available.  Imaging: Xr Knee 3 View Left  Result Date: 03/19/2019 Standing AP x-rays both knees lateral left knee and  sunrise patellar x-rays demonstrate tricompartmental degenerative arthritis negative for acute fracture. Impression: Moderate to severe left knee osteoarthritis.  No acute bone injury.  Xr Knee 3 View Right  Result Date: 03/19/2019 Standing AP right knee lateral right knee and sunrise patellar x-ray is obtained.  AP x-rays weightbearing films.  This shows moderate to severe right knee osteoarthritis with bone-on-bone changes medial compartment marginal osteophytes.  Negative for acute change post fall. Impression: Moderate to severe right knee osteoarthritis with most severe medial compartment.    PMFS History: Patient Active Problem List   Diagnosis Date Noted  . Bilateral primary osteoarthritis of knee 03/19/2019  . B12 deficiency anemia   . Other emphysema ([place])  02/23/2014  . 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[place]) 12/31/2013  . Essential hypertension, benign 11/05/2013  . Routine health maintenance 09/27/2012  . Vitamin D deficiency 06/20/2011  . OBESITY, CLASS II 03/18/2011  . PULMONARY NODULE 07/12/2010  . Diabetes mellitus type 2, controlled, without complications ([place]) 51/01/5851  . Hyperlipidemia 10/01/2007  . ANXIETY 10/01/2007  . Depression 10/01/2007  . OSA (obstructive sleep apnea) 10/01/2007   Past Medical History:  Diagnosis Date  . Anemia   . Anxiety state, unspecified   . Arthritis    KNEES  . B12 deficiency anemia 05/2014 dx  . CIN I (cervical intraepithelial neoplasia I)   . Depressive disorder, not elsewhere classified   . Diabetes mellitus without complication ([place])   . Emphysema of lung ([place])   . Hypertension   . Internal hemorrhoids   . Other and unspecified hyperlipidemia   . Unspecified sleep apnea     Family History  Problem Relation Age of Onset  . Heart attack Father 90  . Diabetes Father   . Cancer Father        lung  . Heart disease Father   . Breast cancer Neg Hx     Past Surgical History:  Procedure Laterality Date  . CERVICAL CERCLAGE    . CESAREAN SECTION    . COLONOSCOPY    . HEMORRHOID SURGERY    . KNEE ARTHROSCOPY Left 12/2014  . UPPER GASTROINTESTINAL ENDOSCOPY     Social History   Occupational History  . 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 Smoking status: Former Smoker    Packs/day: 0.50    Years: 14.00    Pack years: 7.00    Types: Cigarettes  . Smokeless tobacco: Never Used  Substance and Sexual Activity  . Alcohol use: Yes    Alcohol/week: 5.0 - 6.0 standard drinks    Types: 5 - 6 Standard drinks or equivalent per week  . Drug use: No  . Sexual activity: Never    Birth control/protection: Post-menopausal

## 2019-03-20 ENCOUNTER — Other Ambulatory Visit: Payer: Self-pay | Admitting: Family

## 2019-03-30 ENCOUNTER — Other Ambulatory Visit: Payer: Self-pay | Admitting: Family

## 2019-03-30 ENCOUNTER — Telehealth (INDEPENDENT_AMBULATORY_CARE_PROVIDER_SITE_OTHER): Payer: Self-pay | Admitting: Radiology

## 2019-03-30 MED ORDER — ROSUVASTATIN CALCIUM 10 MG PO TABS: ORAL_TABLET | ORAL | 1 refills | Status: DC

## 2019-03-30 NOTE — Telephone Encounter (Signed)
I called patient to confirm appointment for tomorrow morning at 0900 and to review COVID-19 screening questions. I left voicemail and asked her to please return call.

## 2019-03-30 NOTE — Telephone Encounter (Signed)
Requested Prescriptions  Pending Prescriptions Disp Refills  . rosuvastatin (CRESTOR) 10 MG tablet 90 tablet 1    Sig: TAKE 1 TABLET(10 MG) BY MOUTH DAILY     Cardiovascular:  Antilipid - Statins Failed - 03/30/2019 11:53 AM      Failed - LDL in normal range and within 360 days    LDL Cholesterol  Date Value Ref Range Status  09/19/2017 89 0 - 99 mg/dL Final         Failed - Triglycerides in normal range and within 360 days    Triglycerides  Date Value Ref Range Status  09/04/2018 282.0 (H) 0.0 - 149.0 mg/dL Final    Comment:    Normal:  <150 mg/dLBorderline High:  150 - 199 mg/dL         Passed - Total Cholesterol in normal range and within 360 days    Cholesterol  Date Value Ref Range Status  09/04/2018 173 0 - 200 mg/dL Final    Comment:    ATP III Classification       Desirable:  < 200 mg/dL               Borderline High:  200 - 239 mg/dL          High:  > = 240 mg/dL         Passed - HDL in normal range and within 360 days    HDL  Date Value Ref Range Status  09/04/2018 44.70 >39.00 mg/dL Final         Passed - Patient is not pregnant      Passed - Valid encounter within last 12 months    Recent Outpatient Visits          6 months ago Essential hypertension, benign   [place], Marvis Repress, FNP   9 months ago Acute bacterial bronchitis   [place], [place], DO   11 months ago Persistent cough for 3 weeks or longer   [place], Marvis Repress, [place]   1 year ago Other emphysema [facility])   [place], Mortimer Fries, MD   1 year ago Acute non-recurrent maxillary sinusitis   [facility] at [facility], [place], [place]      Future Appointments            Tomorrow Marybelle Killings, MD [facility]

## 2019-03-31 ENCOUNTER — Ambulatory Visit (INDEPENDENT_AMBULATORY_CARE_PROVIDER_SITE_OTHER): Payer: Medicare Other | Admitting: Orthopaedic Surgery

## 2019-04-07 ENCOUNTER — Ambulatory Visit: Payer: Medicare Other

## 2019-04-14 ENCOUNTER — Other Ambulatory Visit: Payer: Self-pay | Admitting: Internal Medicine

## 2019-05-12 ENCOUNTER — Telehealth: Payer: Self-pay

## 2019-05-12 NOTE — Telephone Encounter (Signed)
My-chart message sent to her today as well regarding setting up VV for follow up and getting labs a few days prior to visit.

## 2019-05-12 NOTE — Telephone Encounter (Signed)
Actually, she is going to have to get labs prior to the VV; she hasn't had labs since September either.

## 2019-05-19 ENCOUNTER — Other Ambulatory Visit: Payer: Self-pay | Admitting: Family

## 2019-05-19 DIAGNOSIS — E782 Mixed hyperlipidemia: Secondary | ICD-10-CM

## 2019-05-19 DIAGNOSIS — E119 Type 2 diabetes mellitus without complications: Secondary | ICD-10-CM

## 2019-05-19 MED ORDER — LINAGLIPTIN 5 MG PO TABS: 5.0000 mg | ORAL_TABLET | Freq: Every day | ORAL | 0 refills | Status: DC

## 2019-05-20 ENCOUNTER — Other Ambulatory Visit (INDEPENDENT_AMBULATORY_CARE_PROVIDER_SITE_OTHER): Payer: Medicare Other

## 2019-05-20 DIAGNOSIS — E782 Mixed hyperlipidemia: Secondary | ICD-10-CM | POA: Diagnosis not present

## 2019-05-20 DIAGNOSIS — E119 Type 2 diabetes mellitus without complications: Secondary | ICD-10-CM | POA: Diagnosis not present

## 2019-05-20 LAB — CBC WITH DIFFERENTIAL/PLATELET
Basophils Absolute: 0.1 10*3/uL (ref 0.0–0.1)
Basophils Relative: 1.4 % (ref 0.0–3.0)
Eosinophils Absolute: 0.2 10*3/uL (ref 0.0–0.7)
Eosinophils Relative: 4.1 % (ref 0.0–5.0)
HCT: 34.2 % — ABNORMAL LOW (ref 36.0–46.0)
Hemoglobin: 11.3 g/dL — ABNORMAL LOW (ref 12.0–15.0)
Lymphocytes Relative: 26.7 % (ref 12.0–46.0)
Lymphs Abs: 1.5 10*3/uL (ref 0.7–4.0)
MCHC: 33.2 g/dL (ref 30.0–36.0)
MCV: 85.5 fl (ref 78.0–100.0)
Monocytes Absolute: 0.3 10*3/uL (ref 0.1–1.0)
Monocytes Relative: 6.1 % (ref 3.0–12.0)
Neutro Abs: 3.4 10*3/uL (ref 1.4–7.7)
Neutrophils Relative %: 61.7 % (ref 43.0–77.0)
Platelets: 257 10*3/uL (ref 150.0–400.0)
RBC: 4 Mil/uL (ref 3.87–5.11)
RDW: 15.6 % — ABNORMAL HIGH (ref 11.5–15.5)
WBC: 5.4 10*3/uL (ref 4.0–10.5)

## 2019-05-20 LAB — LIPID PANEL
Cholesterol: 174 mg/dL (ref 0–200)
HDL: 43.8 mg/dL (ref >39.00)
LDL Cholesterol: 94 mg/dL (ref 0–99)
NonHDL: 129.78
Total CHOL/HDL Ratio: 4
Triglycerides: 180 mg/dL — ABNORMAL HIGH (ref 0.0–149.0)
VLDL: 36 mg/dL (ref 0.0–40.0)

## 2019-05-20 LAB — COMPREHENSIVE METABOLIC PANEL
ALT: 16 U/L (ref 0–35)
AST: 17 U/L (ref 0–37)
Albumin: 3.9 g/dL (ref 3.5–5.2)
Alkaline Phosphatase: 77 U/L (ref 39–117)
BUN: 18 mg/dL (ref 6–23)
CO2: 26 mEq/L (ref 19–32)
Calcium: 9.1 mg/dL (ref 8.4–10.5)
Chloride: 103 mEq/L (ref 96–112)
Creatinine, Ser: 0.74 mg/dL (ref 0.40–1.20)
GFR: 77.73 mL/min (ref >60.00)
Glucose, Bld: 163 mg/dL — ABNORMAL HIGH (ref 70–99)
Potassium: 4.2 mEq/L (ref 3.5–5.1)
Sodium: 138 mEq/L (ref 135–145)
Total Bilirubin: 0.4 mg/dL (ref 0.2–1.2)
Total Protein: 7.3 g/dL (ref 6.0–8.3)

## 2019-05-20 LAB — HEMOGLOBIN A1C: Hgb A1c MFr Bld: 7.2 % — ABNORMAL HIGH (ref 4.6–6.5)

## 2019-05-21 ENCOUNTER — Encounter: Payer: Self-pay | Admitting: Family

## 2019-05-21 ENCOUNTER — Ambulatory Visit (INDEPENDENT_AMBULATORY_CARE_PROVIDER_SITE_OTHER): Payer: Medicare Other | Admitting: Family

## 2019-05-21 DIAGNOSIS — E782 Mixed hyperlipidemia: Secondary | ICD-10-CM

## 2019-05-21 DIAGNOSIS — E119 Type 2 diabetes mellitus without complications: Secondary | ICD-10-CM | POA: Diagnosis not present

## 2019-05-21 DIAGNOSIS — I10 Essential (primary) hypertension: Secondary | ICD-10-CM | POA: Diagnosis not present

## 2019-05-21 DIAGNOSIS — F419 Anxiety disorder, unspecified: Secondary | ICD-10-CM

## 2019-05-21 MED ORDER — METFORMIN HCL 1000 MG PO TABS: 1000.0000 mg | ORAL_TABLET | Freq: Two times a day (BID) | ORAL | 0 refills | Status: DC

## 2019-05-21 MED ORDER — FUROSEMIDE 20 MG PO TABS: 20.0000 mg | ORAL_TABLET | Freq: Two times a day (BID) | ORAL | 1 refills | Status: DC

## 2019-05-21 MED ORDER — ALPRAZOLAM 0.5 MG PO TBDP: 0.5000 mg | ORAL_TABLET | Freq: Two times a day (BID) | ORAL | 0 refills | Status: DC | PRN

## 2019-05-21 MED ORDER — LINAGLIPTIN 5 MG PO TABS: 5.0000 mg | ORAL_TABLET | Freq: Every day | ORAL | 1 refills | Status: DC

## 2019-05-21 MED ORDER — ESCITALOPRAM OXALATE 20 MG PO TABS: 20.0000 mg | ORAL_TABLET | Freq: Every day | ORAL | 1 refills | Status: DC

## 2019-05-21 MED ORDER — AMLODIPINE BESYLATE 2.5 MG PO TABS: ORAL_TABLET | ORAL | 1 refills | Status: DC

## 2019-05-21 MED ORDER — IRBESARTAN 300 MG PO TABS: 300.0000 mg | ORAL_TABLET | Freq: Every day | ORAL | 1 refills | Status: DC

## 2019-05-21 MED ORDER — ROSUVASTATIN CALCIUM 10 MG PO TABS: ORAL_TABLET | ORAL | 1 refills | Status: DC

## 2019-05-21 NOTE — Progress Notes (Signed)
Meredith [name] is a 68 y.o. female with the following history as recorded in EpicCare:  Patient Active Problem List   Diagnosis Date Noted  . Bilateral primary osteoarthritis of knee 03/19/2019  . B12 deficiency anemia   . Other emphysema ([place]) 02/23/2014  . 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[place]) 12/31/2013  . Essential hypertension, benign 11/05/2013  . Routine health maintenance 09/27/2012  . Vitamin D deficiency 06/20/2011  . OBESITY, CLASS II 03/18/2011  . PULMONARY NODULE 07/12/2010  . Diabetes mellitus type 2, controlled, without complications ([place]) 35/46/5681  . Hyperlipidemia 10/01/2007  . ANXIETY 10/01/2007  . Depression 10/01/2007  . OSA (obstructive sleep apnea) 10/01/2007    Current Outpatient Medications  Medication Sig Dispense Refill  . ALPRAZolam (NIRAVAM) 0.5 MG dissolvable tablet Take 1 tablet (0.5 mg total) by mouth 2 (two) times daily as needed for anxiety (increased anxiety). 60 tablet 0  . amLODipine (NORVASC) 2.5 MG tablet TAKE 1 TABLET(2.5 MG) BY MOUTH DAILY 90 tablet 1  . aspirin 81 MG tablet Take 81 mg by mouth daily.      Marland Kitchen augmented betamethasone dipropionate (DIPROLENE-AF) 0.05 % cream Apply 1 application topically as needed.  6  . escitalopram (LEXAPRO) 20 MG tablet Take 1 tablet (20 mg total) by mouth daily. 90 tablet 1  . folic acid (FOLVITE) 275 MCG tablet Take 400 mcg by mouth daily.    . furosemide (LASIX) 20 MG tablet Take 1 tablet (20 mg total) by mouth 2 (two) times daily. (Patient taking differently: Take 20 mg by mouth 2 (two) times daily. ) 180 tablet 1  . irbesartan (AVAPRO) 300 MG tablet Take 1 tablet (300 mg total) by mouth daily. 90 tablet 1  . linagliptin (TRADJENTA) 5 MG TABS tablet Take 1 tablet (5 mg total) by mouth daily. 90 tablet 1  . metFORMIN (GLUCOPHAGE) 1000 MG tablet Take 1 tablet (1,000 mg total) by mouth 2 (two) times daily with a meal. 180 tablet 0  . rosuvastatin (CRESTOR) 10 MG tablet TAKE 1 TABLET(10 MG) BY MOUTH DAILY 90  tablet 1   No current facility-administered medications for this visit.     Allergies: Patient has no known allergies.  Past Medical History:  Diagnosis Date  . Anemia   . Anxiety state, unspecified   . Arthritis    KNEES  . B12 deficiency anemia 05/2014 dx  . CIN I (cervical intraepithelial neoplasia I)   . Depressive disorder, not elsewhere classified   . Diabetes mellitus without complication ([place])   . Emphysema of lung ([place])   . Hypertension   . Internal hemorrhoids   . Other and unspecified hyperlipidemia   . Unspecified sleep apnea     Past Surgical History:  Procedure Laterality Date  . CERVICAL CERCLAGE    . CESAREAN SECTION    . COLONOSCOPY    . HEMORRHOID SURGERY    . KNEE ARTHROSCOPY Left 12/2014  . UPPER GASTROINTESTINAL ENDOSCOPY      Family History  Problem Relation Age of Onset  . Heart attack Father 100  . Diabetes Father   . Cancer Father        lung  . Heart disease Father   . Breast cancer Neg Hx     Social History   Tobacco Use  . Smoking status: Former Smoker    Packs/day: 0.50    Years: 14.00    Pack years: 7.00    Types: Cigarettes  . Smokeless tobacco: Never Used  Substance Use Topics  .  Alcohol use: Yes    Alcohol/week: 5.0 - 6.0 standard drinks    Types: 5 - 6 Standard drinks or equivalent per week    Subjective:    I connected with [name] on 05/21/19 at  1:00 PM EDT by a video enabled telemedicine application and verified that I am speaking with the correct person using two identifiers. Patient and I are the only 2 people on the video call.  I discussed the limitations of evaluation and management by telemedicine and the availability of in person appointments. The patient expressed understanding and agreed to proceed.  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  General: Well developed, well nourished, in no acute distress  Skin : Warm and dry.  Head: Normocephalic and atraumatic  Lungs: Respirations unlabored; Neurologic: Alert and oriented; speech intact; face symmetrical;  Assessment:  1. Controlled type 2 diabetes mellitus without complication, without long-term current use of insulin ([place])   2. Benign hypertension   3. Anxiety   4. Mixed hyperlipidemia     Plan:  Reviewed labs with patient that were done today- good control of diabetes; refills updated; follow-up in 6 months, sooner prn. Does not feel that she needs to change anxiety regimen at this time- will contact if she does;   No follow-ups on file.  No orders of the defined types were placed in this encounter.   Requested Prescriptions   Signed Prescriptions Disp Refills  . linagliptin (TRADJENTA) 5 MG TABS tablet 90 tablet 1    Sig: Take 1 tablet (5 mg total) by mouth daily.  Marland Kitchen ALPRAZolam (NIRAVAM) 0.5 MG dissolvable tablet 60 tablet 0    Sig: Take 1 tablet (0.5 mg total) by mouth 2 (two) times daily as needed for anxiety (increased anxiety).  Marland Kitchen amLODipine (NORVASC) 2.5 MG tablet 90 tablet 1    Sig: TAKE 1 TABLET(2.5 MG) BY MOUTH DAILY  . escitalopram (LEXAPRO) 20 MG tablet 90 tablet 1    Sig: Take 1 tablet (20 mg total) by mouth daily.  . furosemide (LASIX) 20 MG tablet 180 tablet 1    Sig: Take 1 tablet (20 mg total) by mouth 2 (two) times daily.    Patient taking differently: Take 20 mg by mouth 2 (two) times daily.  . irbesartan (AVAPRO) 300 MG tablet 90 tablet 1    Sig: Take 1 tablet (300 mg total) by mouth daily.  . metFORMIN (GLUCOPHAGE) 1000 MG tablet 180 tablet 0    Sig: Take 1 tablet (1,000 mg total) by mouth 2 (two) times daily  with a meal.  . rosuvastatin (CRESTOR) 10 MG tablet 90 tablet 1    Sig: TAKE 1 TABLET(10 MG) BY MOUTH DAILY       I

## 2019-05-28 ENCOUNTER — Other Ambulatory Visit: Payer: Self-pay | Admitting: Family

## 2019-05-28 DIAGNOSIS — F419 Anxiety disorder, unspecified: Secondary | ICD-10-CM

## 2019-05-28 MED ORDER — ALPRAZOLAM 0.5 MG PO TBDP: 0.5000 mg | ORAL_TABLET | Freq: Two times a day (BID) | ORAL | 0 refills | Status: DC | PRN

## 2019-06-18 ENCOUNTER — Other Ambulatory Visit: Payer: Self-pay | Admitting: Family

## 2019-07-05 ENCOUNTER — Telehealth: Payer: Self-pay | Admitting: *Deleted

## 2019-07-05 NOTE — Telephone Encounter (Signed)
A message was left, re: follow up visit. 

## 2019-07-09 ENCOUNTER — Other Ambulatory Visit: Payer: Self-pay | Admitting: *Deleted

## 2019-07-09 DIAGNOSIS — Z87891 Personal history of nicotine dependence: Secondary | ICD-10-CM

## 2019-07-09 DIAGNOSIS — Z122 Encounter for screening for malignant neoplasm of respiratory organs: Secondary | ICD-10-CM

## 2019-07-12 ENCOUNTER — Encounter: Payer: Self-pay | Admitting: Family

## 2019-07-13 ENCOUNTER — Other Ambulatory Visit: Payer: Self-pay | Admitting: Family

## 2019-07-13 ENCOUNTER — Telehealth: Payer: Self-pay | Admitting: Family

## 2019-07-13 DIAGNOSIS — F419 Anxiety disorder, unspecified: Secondary | ICD-10-CM

## 2019-07-13 MED ORDER — ALPRAZOLAM 0.5 MG PO TBDP: 0.5000 mg | ORAL_TABLET | Freq: Two times a day (BID) | ORAL | 0 refills | Status: DC | PRN

## 2019-07-13 NOTE — Telephone Encounter (Signed)
I don't think her insurance is going to pay for the dissolvable Xanax based on notice from the pharmacy. She can talk to them about out of pocket costs or we can change to other form.

## 2019-07-13 NOTE — Telephone Encounter (Signed)
Message sent to patient via mychart

## 2019-08-17 ENCOUNTER — Ambulatory Visit (INDEPENDENT_AMBULATORY_CARE_PROVIDER_SITE_OTHER)
Admission: RE | Admit: 2019-08-17 | Discharge: 2019-08-17 | Disposition: A | Discharge status: Home / Self Care | Payer: Medicare Other | Source: Ambulatory Visit | Attending: Acute Care | Admitting: Acute Care

## 2019-08-17 ENCOUNTER — Other Ambulatory Visit: Payer: Self-pay

## 2019-08-17 DIAGNOSIS — Z87891 Personal history of nicotine dependence: Secondary | ICD-10-CM

## 2019-08-17 DIAGNOSIS — Z122 Encounter for screening for malignant neoplasm of respiratory organs: Secondary | ICD-10-CM

## 2019-08-20 ENCOUNTER — Other Ambulatory Visit: Payer: Self-pay | Admitting: *Deleted

## 2019-08-20 DIAGNOSIS — Z87891 Personal history of nicotine dependence: Secondary | ICD-10-CM

## 2019-08-20 DIAGNOSIS — Z122 Encounter for screening for malignant neoplasm of respiratory organs: Secondary | ICD-10-CM

## 2019-08-24 ENCOUNTER — Encounter: Payer: Self-pay | Admitting: Family

## 2019-08-25 ENCOUNTER — Other Ambulatory Visit: Payer: Self-pay | Admitting: Family

## 2019-08-25 ENCOUNTER — Encounter: Payer: Self-pay | Admitting: Family

## 2019-09-05 ENCOUNTER — Other Ambulatory Visit: Payer: Self-pay | Admitting: Family

## 2019-10-06 ENCOUNTER — Other Ambulatory Visit: Payer: Self-pay | Admitting: Family

## 2019-10-06 MED ORDER — METFORMIN HCL 1000 MG PO TABS: ORAL_TABLET | ORAL | 0 refills | Status: DC

## 2019-10-11 ENCOUNTER — Encounter: Payer: Self-pay | Admitting: Family

## 2019-10-11 ENCOUNTER — Other Ambulatory Visit: Payer: Self-pay | Admitting: Family

## 2019-10-11 DIAGNOSIS — E119 Type 2 diabetes mellitus without complications: Secondary | ICD-10-CM

## 2019-10-11 DIAGNOSIS — R5383 Other fatigue: Secondary | ICD-10-CM

## 2019-10-11 DIAGNOSIS — R200 Anesthesia of skin: Secondary | ICD-10-CM

## 2019-10-11 DIAGNOSIS — R202 Paresthesia of skin: Secondary | ICD-10-CM

## 2019-10-11 DIAGNOSIS — E559 Vitamin D deficiency, unspecified: Secondary | ICD-10-CM

## 2019-10-12 ENCOUNTER — Other Ambulatory Visit (INDEPENDENT_AMBULATORY_CARE_PROVIDER_SITE_OTHER): Payer: Medicare Other

## 2019-10-12 DIAGNOSIS — E119 Type 2 diabetes mellitus without complications: Secondary | ICD-10-CM

## 2019-10-12 DIAGNOSIS — R5383 Other fatigue: Secondary | ICD-10-CM | POA: Diagnosis not present

## 2019-10-12 DIAGNOSIS — R202 Paresthesia of skin: Secondary | ICD-10-CM

## 2019-10-12 DIAGNOSIS — R200 Anesthesia of skin: Secondary | ICD-10-CM | POA: Diagnosis not present

## 2019-10-12 DIAGNOSIS — E559 Vitamin D deficiency, unspecified: Secondary | ICD-10-CM | POA: Diagnosis not present

## 2019-10-12 LAB — CBC WITH DIFFERENTIAL/PLATELET
Basophils Absolute: 0.1 10*3/uL (ref 0.0–0.1)
Basophils Relative: 1.3 % (ref 0.0–3.0)
Eosinophils Absolute: 0.2 10*3/uL (ref 0.0–0.7)
Eosinophils Relative: 4.2 % (ref 0.0–5.0)
HCT: 33.3 % — ABNORMAL LOW (ref 36.0–46.0)
Hemoglobin: 10.7 g/dL — ABNORMAL LOW (ref 12.0–15.0)
Lymphocytes Relative: 22 % (ref 12.0–46.0)
Lymphs Abs: 1.3 10*3/uL (ref 0.7–4.0)
MCHC: 32.2 g/dL (ref 30.0–36.0)
MCV: 85.5 fl (ref 78.0–100.0)
Monocytes Absolute: 0.3 10*3/uL (ref 0.1–1.0)
Monocytes Relative: 5.2 % (ref 3.0–12.0)
Neutro Abs: 4 10*3/uL (ref 1.4–7.7)
Neutrophils Relative %: 67.3 % (ref 43.0–77.0)
Platelets: 260 10*3/uL (ref 150.0–400.0)
RBC: 3.9 Mil/uL (ref 3.87–5.11)
RDW: 14.8 % (ref 11.5–15.5)
WBC: 5.9 10*3/uL (ref 4.0–10.5)

## 2019-10-12 LAB — COMPREHENSIVE METABOLIC PANEL
ALT: 8 U/L (ref 0–35)
AST: 13 U/L (ref 0–37)
Albumin: 3.9 g/dL (ref 3.5–5.2)
Alkaline Phosphatase: 77 U/L (ref 39–117)
BUN: 13 mg/dL (ref 6–23)
CO2: 28 mEq/L (ref 19–32)
Calcium: 9 mg/dL (ref 8.4–10.5)
Chloride: 103 mEq/L (ref 96–112)
Creatinine, Ser: 0.75 mg/dL (ref 0.40–1.20)
GFR: 76.45 mL/min (ref >60.00)
Glucose, Bld: 265 mg/dL — ABNORMAL HIGH (ref 70–99)
Potassium: 3.9 mEq/L (ref 3.5–5.1)
Sodium: 138 mEq/L (ref 135–145)
Total Bilirubin: 0.4 mg/dL (ref 0.2–1.2)
Total Protein: 7.2 g/dL (ref 6.0–8.3)

## 2019-10-12 LAB — HEMOGLOBIN A1C: Hgb A1c MFr Bld: 7.3 % — ABNORMAL HIGH (ref 4.6–6.5)

## 2019-10-12 LAB — VITAMIN D 25 HYDROXY (VIT D DEFICIENCY, FRACTURES): VITD: 18.3 ng/mL — ABNORMAL LOW (ref 30.00–100.00)

## 2019-10-12 LAB — VITAMIN B12: Vitamin B-12: 172 pg/mL — ABNORMAL LOW (ref 211–911)

## 2019-10-12 LAB — TSH: TSH: 2.62 u[IU]/mL (ref 0.35–4.50)

## 2019-10-15 ENCOUNTER — Other Ambulatory Visit: Payer: Self-pay | Admitting: Family

## 2019-10-15 MED ORDER — VITAMIN D (ERGOCALCIFEROL) 1.25 MG (50000 UNIT) PO CAPS: 50000.0000 [IU] | ORAL_CAPSULE | ORAL | 0 refills | Status: AC

## 2019-10-16 ENCOUNTER — Ambulatory Visit: Payer: Medicare Other

## 2019-10-18 ENCOUNTER — Other Ambulatory Visit: Payer: Self-pay

## 2019-10-18 ENCOUNTER — Ambulatory Visit (INDEPENDENT_AMBULATORY_CARE_PROVIDER_SITE_OTHER): Payer: Medicare Other

## 2019-10-18 DIAGNOSIS — Z23 Encounter for immunization: Secondary | ICD-10-CM

## 2019-10-18 DIAGNOSIS — E538 Deficiency of other specified B group vitamins: Secondary | ICD-10-CM | POA: Diagnosis not present

## 2019-10-18 MED ORDER — CYANOCOBALAMIN 1000 MCG/ML IJ SOLN
1000.0000 ug | Freq: Once | INTRAMUSCULAR | Status: AC
  Administered 2019-10-18: 1000 ug via INTRAMUSCULAR

## 2019-10-25 DIAGNOSIS — L03011 Cellulitis of right finger: Secondary | ICD-10-CM | POA: Diagnosis not present

## 2019-10-29 NOTE — Progress Notes (Signed)
Agree with treatment 

## 2019-11-15 ENCOUNTER — Other Ambulatory Visit (INDEPENDENT_AMBULATORY_CARE_PROVIDER_SITE_OTHER): Payer: Medicare Other

## 2019-11-15 DIAGNOSIS — E119 Type 2 diabetes mellitus without complications: Secondary | ICD-10-CM

## 2019-11-15 LAB — CBC WITH DIFFERENTIAL/PLATELET
Basophils Absolute: 0.1 10*3/uL (ref 0.0–0.1)
Basophils Relative: 1.3 % (ref 0.0–3.0)
Eosinophils Absolute: 0.3 10*3/uL (ref 0.0–0.7)
Eosinophils Relative: 4.1 % (ref 0.0–5.0)
HCT: 32.9 % — ABNORMAL LOW (ref 36.0–46.0)
Hemoglobin: 10.6 g/dL — ABNORMAL LOW (ref 12.0–15.0)
Lymphocytes Relative: 28.2 % (ref 12.0–46.0)
Lymphs Abs: 1.7 10*3/uL (ref 0.7–4.0)
MCHC: 32.3 g/dL (ref 30.0–36.0)
MCV: 85.4 fl (ref 78.0–100.0)
Monocytes Absolute: 0.4 10*3/uL (ref 0.1–1.0)
Monocytes Relative: 6.1 % (ref 3.0–12.0)
Neutro Abs: 3.7 10*3/uL (ref 1.4–7.7)
Neutrophils Relative %: 60.3 % (ref 43.0–77.0)
Platelets: 237 10*3/uL (ref 150.0–400.0)
RBC: 3.85 Mil/uL — ABNORMAL LOW (ref 3.87–5.11)
RDW: 15.2 % (ref 11.5–15.5)
WBC: 6.1 10*3/uL (ref 4.0–10.5)

## 2019-11-15 LAB — COMPREHENSIVE METABOLIC PANEL
ALT: 11 U/L (ref 0–35)
AST: 14 U/L (ref 0–37)
Albumin: 3.9 g/dL (ref 3.5–5.2)
Alkaline Phosphatase: 72 U/L (ref 39–117)
BUN: 11 mg/dL (ref 6–23)
CO2: 27 mEq/L (ref 19–32)
Calcium: 9.1 mg/dL (ref 8.4–10.5)
Chloride: 103 mEq/L (ref 96–112)
Creatinine, Ser: 0.68 mg/dL (ref 0.40–1.20)
GFR: 85.58 mL/min (ref >60.00)
Glucose, Bld: 114 mg/dL — ABNORMAL HIGH (ref 70–99)
Potassium: 3.8 mEq/L (ref 3.5–5.1)
Sodium: 138 mEq/L (ref 135–145)
Total Bilirubin: 0.3 mg/dL (ref 0.2–1.2)
Total Protein: 7.2 g/dL (ref 6.0–8.3)

## 2019-11-15 LAB — HEMOGLOBIN A1C: Hgb A1c MFr Bld: 7.3 % — ABNORMAL HIGH (ref 4.6–6.5)

## 2019-11-17 ENCOUNTER — Encounter: Payer: Self-pay | Admitting: Family

## 2019-11-17 ENCOUNTER — Ambulatory Visit (INDEPENDENT_AMBULATORY_CARE_PROVIDER_SITE_OTHER): Payer: Medicare Other | Admitting: Family

## 2019-11-17 ENCOUNTER — Telehealth: Payer: Self-pay | Admitting: *Deleted

## 2019-11-17 ENCOUNTER — Other Ambulatory Visit: Payer: Self-pay

## 2019-11-17 VITALS — BP 126/76 | HR 76 | Temp 97.9°F | Ht 65.0 in | Wt 218.6 lb

## 2019-11-17 DIAGNOSIS — E119 Type 2 diabetes mellitus without complications: Secondary | ICD-10-CM

## 2019-11-17 DIAGNOSIS — G4730 Sleep apnea, unspecified: Secondary | ICD-10-CM

## 2019-11-17 DIAGNOSIS — I10 Essential (primary) hypertension: Secondary | ICD-10-CM | POA: Diagnosis not present

## 2019-11-17 DIAGNOSIS — R0600 Dyspnea, unspecified: Secondary | ICD-10-CM

## 2019-11-17 DIAGNOSIS — D519 Vitamin B12 deficiency anemia, unspecified: Secondary | ICD-10-CM | POA: Diagnosis not present

## 2019-11-17 DIAGNOSIS — R5383 Other fatigue: Secondary | ICD-10-CM

## 2019-11-17 MED ORDER — CYANOCOBALAMIN 1000 MCG/ML IJ SOLN
1000.0000 ug | Freq: Once | INTRAMUSCULAR | Status: AC
  Administered 2019-11-17: 12:00:00 1000 ug via INTRAMUSCULAR

## 2019-11-17 NOTE — Progress Notes (Signed)
Meredith [name] is a 68 y.o. female with the following history as recorded in EpicCare:  Patient Active Problem List   Diagnosis Date Noted  . Bilateral primary osteoarthritis of knee 03/19/2019  . B12 deficiency anemia   . Other emphysema ([place]) 02/23/2014  . 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[place]) 12/31/2013  . Essential hypertension, benign 11/05/2013  . Routine health maintenance 09/27/2012  . Vitamin D deficiency 06/20/2011  . OBESITY, CLASS II 03/18/2011  . PULMONARY NODULE 07/12/2010  . Diabetes mellitus type 2, controlled, without complications ([place]) 99991111  . Hyperlipidemia 10/01/2007  . ANXIETY 10/01/2007  . Depression 10/01/2007  . OSA (obstructive sleep apnea) 10/01/2007    Current Outpatient Medications  Medication Sig Dispense Refill  . ALPRAZolam (NIRAVAM) 0.5 MG dissolvable tablet Take 1 tablet (0.5 mg total) by mouth 2 (two) times daily as needed for anxiety. 60 tablet 0  . amLODipine (NORVASC) 2.5 MG tablet TAKE 1 TABLET(2.5 MG) BY MOUTH DAILY 90 tablet 1  . aspirin 81 MG tablet Take 81 mg by mouth daily.      Marland Kitchen augmented betamethasone dipropionate (DIPROLENE-AF) 0.05 % cream Apply 1 application topically as needed.  6  . escitalopram (LEXAPRO) 20 MG tablet Take 1 tablet (20 mg total) by mouth daily. 90 tablet 1  . folic acid (FOLVITE) A999333 MCG tablet Take 400 mcg by mouth daily.    . furosemide (LASIX) 20 MG tablet Take 1 tablet (20 mg total) by mouth 2 (two) times daily. (Patient taking differently: Take 20 mg by mouth 2 (two) times daily. ) 180 tablet 1  . irbesartan (AVAPRO) 300 MG tablet TAKE 1 TABLET BY MOUTH EVERY DAY 90 tablet 1  . linagliptin (TRADJENTA) 5 MG TABS tablet Take 1 tablet (5 mg total) by mouth daily. 90 tablet 1  . metFORMIN (GLUCOPHAGE) 1000 MG tablet TAKE 1 TABLET BY MOUTH TWICE DAILY WITH A MEAL 180 tablet 0  . nystatin-triamcinolone ointment (MYCOLOG) APPLY TO AFFECTED AREA ON THE SKIN TWICE A DAY    . prednisoLONE acetate (PRED FORTE) 1 %  ophthalmic suspension prednisolone acetate 1 % eye drops,suspension    . rosuvastatin (CRESTOR) 10 MG tablet TAKE 1 TABLET(10 MG) BY MOUTH DAILY 90 tablet 1  . Vitamin D, Ergocalciferol, (DRISDOL) 1.25 MG (50000 UT) CAPS capsule Take 1 capsule (50,000 Units total) by mouth every 7 (seven) days for 12 doses. 12 capsule 0  . calcipotriene (DOVONOX) 0.005 % cream calcipotriene 0.005 % topical cream  APP THIN LAYER AA BID PRN     No current facility-administered medications for this visit.     Allergies: Patient has no known allergies.  Past Medical History:  Diagnosis Date  . Anemia   . Anxiety state, unspecified   . Arthritis    KNEES  . B12 deficiency anemia 05/2014 dx  . CIN I (cervical intraepithelial neoplasia I)   . Depressive disorder, not elsewhere classified   . Diabetes mellitus without complication ([place])   . Emphysema of lung ([place])   . Hypertension   . Internal hemorrhoids   . Other and unspecified hyperlipidemia   . Unspecified sleep apnea     Past Surgical History:  Procedure Laterality Date  . CERVICAL CERCLAGE    . CESAREAN SECTION    . COLONOSCOPY    . HEMORRHOID SURGERY    . KNEE ARTHROSCOPY Left 12/2014  . UPPER GASTROINTESTINAL ENDOSCOPY      Family History  Problem Relation Age of Onset  . Heart attack Father 60  .  Diabetes Father   . Cancer Father        lung  . Heart disease Father   . Breast cancer Neg Hx     Social History   Tobacco Use  . Smoking status: Former Smoker    Packs/day: 0.50    Years: 14.00    Pack years: 7.00    Types: Cigarettes  . Smokeless tobacco: Never Used  Substance Use Topics  . Alcohol use: Yes    Alcohol/week: 5.0 - 6.0 standard drinks    Types: 5 - 6 Standard drinks or equivalent per week    Subjective:   6 month follow-up on chronic care needs; is continuing to feel "very tired."  Was recently found to have low B12 level- anemia; has only been able to get one B12 shot since diagnosis was made; did not understand  to come weekly for 1st 4 weeks.  Does have sleep apnea- has not had check up with pulmonology in 3 years; wears machine every night but wakes up feeling very tired; Did have one episode of "chest pain" last week- not with exertion/ felt like a "twinge"; but FH of CAD/ fatal MI in her dad at young age;     Objective:  Vitals:   11/17/19 1021  BP: 126/76  Pulse: 76  Temp: 97.9 F (36.6 C)  TempSrc: Oral  SpO2: 97%  Weight: 218 lb 9.6 oz (99.2 kg)  Height: 5\' 5"  (1.651 m)    General: Well developed, well nourished, in no acute distress  Skin : Warm and dry.  Head: Normocephalic and atraumatic  Eyes: Sclera and conjunctiva clear; pupils round and reactive to light; extraocular movements intact  Ears: External normal; canals clear; tympanic membranes normal  Oropharynx: Pink, supple. No suspicious lesions  Neck: Supple without thyromegaly, adenopathy  Lungs: Respirations unlabored; clear to auscultation bilaterally without wheeze, rales, rhonchi  CVS exam: normal rate and regular rhythm.  Abdomen: Soft; nontender; nondistended; normoactive bowel sounds; no masses or hepatosplenomegaly  Musculoskeletal: No deformities; no active joint inflammation  Extremities: No edema, cyanosis, clubbing  Vessels: Symmetric bilaterally  Neurologic: Alert and oriented; speech intact; face symmetrical; moves all extremities well; CNII-XII intact without focal deficit   Assessment:  1. Sleep apnea, unspecified type   2. Other fatigue   3. Dyspnea, unspecified type   4. Essential hypertension, benign   5. Anemia due to vitamin B12 deficiency, unspecified B12 deficiency type   6. Controlled type 2 diabetes mellitus without complication, without long-term current use of insulin ([place])     Plan:  1. Refer back to pulmonology; 2. & 3. Check EKG- no acute findings; refer back to cardiology; 4. Stable; 5. Do weekly B12 shots x 4 weeks and then re-check level/ response; 6. Stable; recent Hgba1c at 7.3;    Return in about 1 week (around 11/24/2019) for B12 shot/ nurse schedule ( #2).  Orders Placed This Encounter  Procedures  . Ambulatory referral to Pulmonology    Referral Priority:   Routine    Referral Type:   Consultation    Referral Reason:   Specialty Services Required    Referred to Provider:   Rigoberto Noel, MD    Requested Specialty:   Pulmonary Disease    Number of Visits Requested:   1  . Ambulatory referral to Cardiology    Referral Priority:   Routine    Referral Type:   Consultation    Referral Reason:   Specialty Services Required  Referred to Provider:   Minus Breeding, MD    Requested Specialty:   Cardiology    Number of Visits Requested:   1  . EKG 12-Lead    Requested Prescriptions    No prescriptions requested or ordered in this encounter

## 2019-11-17 NOTE — Telephone Encounter (Signed)
A message was left, re: her new patient appointment. 

## 2019-11-24 ENCOUNTER — Other Ambulatory Visit: Payer: Self-pay

## 2019-11-24 ENCOUNTER — Telehealth: Payer: Self-pay | Admitting: *Deleted

## 2019-11-24 ENCOUNTER — Ambulatory Visit (INDEPENDENT_AMBULATORY_CARE_PROVIDER_SITE_OTHER): Payer: Medicare Other

## 2019-11-24 DIAGNOSIS — E538 Deficiency of other specified B group vitamins: Secondary | ICD-10-CM | POA: Diagnosis not present

## 2019-11-24 MED ORDER — CYANOCOBALAMIN 1000 MCG/ML IJ SOLN
1000.0000 ug | Freq: Once | INTRAMUSCULAR | Status: AC
  Administered 2019-11-24: 1000 ug via INTRAMUSCULAR

## 2019-11-24 NOTE — Progress Notes (Signed)
Agree with treatment plan.

## 2019-11-24 NOTE — Telephone Encounter (Signed)
A message was left, re: her new patient appointment. 

## 2019-11-29 ENCOUNTER — Other Ambulatory Visit: Payer: Self-pay | Admitting: Family

## 2019-11-29 DIAGNOSIS — I10 Essential (primary) hypertension: Secondary | ICD-10-CM

## 2019-11-29 MED ORDER — FUROSEMIDE 20 MG PO TABS: 20.0000 mg | ORAL_TABLET | Freq: Two times a day (BID) | ORAL | 1 refills | Status: DC

## 2019-12-02 ENCOUNTER — Ambulatory Visit (INDEPENDENT_AMBULATORY_CARE_PROVIDER_SITE_OTHER): Payer: Medicare Other

## 2019-12-02 ENCOUNTER — Other Ambulatory Visit: Payer: Self-pay

## 2019-12-02 ENCOUNTER — Telehealth: Payer: Self-pay

## 2019-12-02 DIAGNOSIS — E538 Deficiency of other specified B group vitamins: Secondary | ICD-10-CM | POA: Diagnosis not present

## 2019-12-02 MED ORDER — CYANOCOBALAMIN 1000 MCG/ML IJ SOLN
1000.0000 ug | Freq: Once | INTRAMUSCULAR | Status: AC
  Administered 2019-12-02: 1000 ug via INTRAMUSCULAR

## 2019-12-02 NOTE — Telephone Encounter (Signed)
Patient has restarted weekly b12 shots ---recd #2 out of 4 today----routing to laura, do you want patient to see you after #4 injection to recheck lab or do you want her to take OTC supplements for awhile and then see you for follow up----please advise, I will call patient back, thanks

## 2019-12-02 NOTE — Progress Notes (Signed)
b12 Injection given.   Meredith Marcelli J Jahzir Strohmeier, MD  

## 2019-12-03 NOTE — Addendum Note (Signed)
Addended by: Sherlene Shams on: 12/03/2019 05:34 PM   Modules accepted: Orders

## 2019-12-03 NOTE — Telephone Encounter (Signed)
I will put the order in- she can just plan to come to the [place] location for the lab at week 5.

## 2019-12-03 NOTE — Telephone Encounter (Signed)
Check the level 1 week after finishing 4th injection- will make decision then.

## 2019-12-03 NOTE — Telephone Encounter (Signed)
Routing to laura, should I put the order in now to wait for one week, or would you rather do another way?  Please advise, thanks

## 2019-12-06 NOTE — Telephone Encounter (Signed)
Patient advised to come to lab a full week after #4 b12 weekly injection---given lab hours, no appt needed---we call patient with that result and laura will advise how she needs to continue with b12 supplementation at that time--patient repeated back for understanding

## 2019-12-08 ENCOUNTER — Ambulatory Visit: Payer: Medicare Other

## 2019-12-09 ENCOUNTER — Ambulatory Visit (INDEPENDENT_AMBULATORY_CARE_PROVIDER_SITE_OTHER): Payer: Medicare Other

## 2019-12-09 ENCOUNTER — Other Ambulatory Visit: Payer: Self-pay

## 2019-12-09 ENCOUNTER — Telehealth: Payer: Self-pay | Admitting: *Deleted

## 2019-12-09 DIAGNOSIS — E538 Deficiency of other specified B group vitamins: Secondary | ICD-10-CM

## 2019-12-09 MED ORDER — CYANOCOBALAMIN 1000 MCG/ML IJ SOLN
1000.0000 ug | Freq: Once | INTRAMUSCULAR | Status: AC
  Administered 2019-12-13: 1000 ug via INTRAMUSCULAR

## 2019-12-09 NOTE — Telephone Encounter (Signed)
We have made sever attempts to schedule Meredith [name] her new patient appointment,at this no response.

## 2019-12-09 NOTE — Progress Notes (Signed)
b12 Injection given.   Meredith [name] J Rodderick Holtzer, MD  

## 2019-12-10 ENCOUNTER — Telehealth: Payer: Self-pay

## 2019-12-10 NOTE — Telephone Encounter (Signed)
Called patient today and left message for her to call [facility] to schedule.  Also sent a my-chart message to her.

## 2019-12-13 ENCOUNTER — Telehealth: Payer: Self-pay

## 2019-12-13 DIAGNOSIS — E538 Deficiency of other specified B group vitamins: Secondary | ICD-10-CM | POA: Diagnosis not present

## 2019-12-13 NOTE — Telephone Encounter (Signed)
error 

## 2019-12-17 ENCOUNTER — Ambulatory Visit: Payer: Medicare Other

## 2019-12-17 ENCOUNTER — Other Ambulatory Visit: Payer: Self-pay | Admitting: Family

## 2019-12-17 MED ORDER — LINAGLIPTIN 5 MG PO TABS: 5.0000 mg | ORAL_TABLET | Freq: Every day | ORAL | 1 refills | Status: DC

## 2019-12-17 NOTE — Telephone Encounter (Signed)
Medication Refill - Medication:  linagliptin (TRADJENTA) 5 MG TABS tablet  Has the patient contacted their pharmacy? Yes advised to call. States they have sent multiple requests with no answer.   Preferred Pharmacy (with phone number or street name):  [id number] [place], [place] Phone:  [phone]  Fax:  [phone]     Agent: Please be advised that RX refills may take up to 3 business days. We ask that you follow-up with your pharmacy.

## 2019-12-20 ENCOUNTER — Other Ambulatory Visit: Payer: Self-pay | Admitting: Family

## 2019-12-20 MED ORDER — AMLODIPINE BESYLATE 2.5 MG PO TABS: ORAL_TABLET | ORAL | 1 refills | Status: DC

## 2019-12-22 ENCOUNTER — Ambulatory Visit (INDEPENDENT_AMBULATORY_CARE_PROVIDER_SITE_OTHER): Payer: Medicare Other | Admitting: *Deleted

## 2019-12-22 ENCOUNTER — Other Ambulatory Visit: Payer: Self-pay

## 2019-12-22 DIAGNOSIS — E538 Deficiency of other specified B group vitamins: Secondary | ICD-10-CM

## 2019-12-22 MED ORDER — CYANOCOBALAMIN 1000 MCG/ML IJ SOLN
1000.0000 ug | Freq: Once | INTRAMUSCULAR | Status: AC
  Administered 2019-12-22: 1000 ug via INTRAMUSCULAR

## 2019-12-29 ENCOUNTER — Other Ambulatory Visit: Payer: Self-pay | Admitting: Family

## 2019-12-29 ENCOUNTER — Ambulatory Visit: Payer: Medicare Other | Admitting: Orthopaedic Surgery

## 2019-12-29 MED ORDER — ROSUVASTATIN CALCIUM 10 MG PO TABS: ORAL_TABLET | ORAL | 1 refills | Status: DC

## 2019-12-29 NOTE — Telephone Encounter (Signed)
Medication Refill - Medication: rosuvastatin (CRESTOR) 10 MG tablet   Pt is completley out   Has the patient contacted their pharmacy? Yes.   (Agent: If no, request that the patient contact the pharmacy for the refill.) (Agent: If yes, when and what did the pharmacy advise?)  Preferred Pharmacy (with phone number or street name):  [id number] [place] [place]  [id number] [place] [id number]  Phone: [phone] Fax: [phone]     Agent: Please be advised that RX refills may take up to 3 business days. We ask that you follow-up with your pharmacy.

## 2020-01-03 ENCOUNTER — Other Ambulatory Visit: Payer: Self-pay | Admitting: Family

## 2020-01-03 NOTE — Progress Notes (Signed)
Agree with treatment 

## 2020-01-04 ENCOUNTER — Ambulatory Visit: Payer: Medicare Other | Admitting: Orthopaedic Surgery

## 2020-01-05 ENCOUNTER — Institutional Professional Consult (permissible substitution): Payer: Medicare Other | Admitting: Pulmonary Disease

## 2020-01-10 DIAGNOSIS — Z7189 Other specified counseling: Secondary | ICD-10-CM | POA: Insufficient documentation

## 2020-01-10 DIAGNOSIS — R079 Chest pain, unspecified: Secondary | ICD-10-CM | POA: Insufficient documentation

## 2020-01-10 NOTE — Progress Notes (Deleted)
Cardiology Office Note   Date:  01/10/2020   ID:  Meredith, [name] 15-Jun-1951, MRN [id number]  PCP:  Marrian Salvage, FNP  Cardiologist:  Dr. Percival Spanish 09/10/2016    No chief complaint on file.   History of Present Illness: Meredith [name] is a 69 y.o. female for follow up of chest pain.   She had a negative stress test in 2017.   At the last visit she was treated with Norvasc.  ***     with a history of DOE (POET inconclusive>>MV nl), B12 def anemia, DM, HTN, HLD, OSA, depression/anxiety  Meredith [name] presents for 6 month follow up.  She has been doing fairly well.   She had some upper sternal pain the other day. She has a hx anxiety and found out last week that her daughter is having twins. She had been panicky at times due to family issues, and had an episode of chest pain the other day. She laid down and rested and felt better. She only gets the chest pain when under stress, this is mainly from her children's issues. She does not get CP with physical exertion and does not get it at work.   She saw her PCP during all of this and her BP was 180/110. Since then, it has been elevated more than usual. She has Not been checking regularly at home.  She has lower extremity edema on a regular basis. She also has some dyspnea on exertion.   Past Medical History:  Diagnosis Date  . Anemia   . Anxiety state, unspecified   . Arthritis    KNEES  . B12 deficiency anemia 05/2014 dx  . CIN I (cervical intraepithelial neoplasia I)   . Depressive disorder, not elsewhere classified   . Diabetes mellitus without complication ([place])   . Emphysema of lung ([place])   . Hypertension   . Internal hemorrhoids   . Other and unspecified hyperlipidemia   . Unspecified sleep apnea     Past Surgical History:  Procedure Laterality Date  . CERVICAL CERCLAGE    . CESAREAN SECTION    . COLONOSCOPY    . HEMORRHOID SURGERY    . KNEE ARTHROSCOPY Left 12/2014  . UPPER GASTROINTESTINAL ENDOSCOPY       Medication Sig  . ALPRAZolam (NIRAVAM) 0.5 MG dissolvable tablet Take 1 tablet (0.5 mg total) by mouth daily as needed for anxiety (increased anxiety).  Marland Kitchen aspirin 81 MG tablet Take 81 mg by mouth daily.    . chlorpheniramine-HYDROcodone (TUSSIONEX PENNKINETIC ER) 10-8 MG/5ML SUER Take 5 mLs by mouth every 12 (twelve) hours as needed for cough.  . cyanocobalamin (,VITAMIN B-12,) 1000 MCG/ML injection Inject 1,000 mcg into the muscle every 30 (thirty) days.  Marland Kitchen escitalopram (LEXAPRO) 10 MG tablet Take 1 tablet (10 mg total) by mouth daily.  . folic acid (FOLVITE) A999333 MCG tablet Take 400 mcg by mouth daily.  . furosemide (LASIX) 20 MG tablet TAKE 1 TABLET(20 MG) BY MOUTH DAILY  . irbesartan (AVAPRO) 300 MG tablet Take 1 tablet (300 mg total) by mouth daily.  . metFORMIN (GLUCOPHAGE) 1000 MG tablet TAKE 1 TABLET(1000 MG) BY MOUTH TWICE DAILY WITH A MEAL  . simvastatin (ZOCOR) 40 MG tablet Take 1 tablet (40 mg total) by mouth at bedtime.   No current facility-administered medications for this visit.     Allergies:   Patient has no known allergies.    ROS:  ***. All other systems are reviewed and  negative.    PHYSICAL EXAM: VS:  LMP 12/30/1998 (Approximate)  , BMI There is no height or weight on file to calculate BMI. GENERAL:  Well appearing NECK:  No jugular venous distention, waveform within normal limits, carotid upstroke brisk and symmetric, no bruits, no thyromegaly LUNGS:  Clear to auscultation bilaterally CHEST:  Unremarkable HEART:  PMI not displaced or sustained,S1 and S2 within normal limits, no S3, no S4, no clicks, no rubs, *** murmurs ABD:  Flat, positive bowel sounds normal in frequency in pitch, no bruits, no rebound, no guarding, no midline pulsatile mass, no hepatomegaly, no splenomegaly EXT:  2 plus pulses throughout, no edema, no cyanosis no clubbing   ***GEN: Well nourished, well developed, female in no acute distress  HEENT: normal for age  Neck: Minimal JVD, no  carotid bruit, no masses Cardiac: RRR; no murmur, no rubs, or gallops Respiratory:  clear to auscultation bilaterally, normal work of breathing GI: soft, nontender, nondistended, + BS MS: no deformity or atrophy; trace edema; distal pulses are 2+ in all 4 extremities   Skin: warm and dry, no rash Neuro:  Strength and sensation are intact Psych: euthymic mood, full affect   EKG:  EKG *** ordered today.   Recent Labs: 10/12/2019: TSH 2.62 11/15/2019: ALT 11; BUN 11; Creatinine, Ser 0.68; Hemoglobin 10.6; Platelets 237.0; Potassium 3.8; Sodium 138    Lipid Panel    Component Value Date/Time   CHOL 174 05/20/2019 1521   TRIG 180.0 (H) 05/20/2019 1521   HDL 43.80 05/20/2019 1521   CHOLHDL 4 05/20/2019 1521   VLDL 36.0 05/20/2019 1521   LDLCALC 94 05/20/2019 1521   LDLDIRECT 109.0 09/04/2018 1605     Wt Readings from Last 3 Encounters:  02/25/17 237 lb 12.8 oz (107.9 kg)  02/17/17 232 lb (105.2 kg)  01/06/17 231 lb 3.2 oz (96.7 kg)     Other studies Reviewed: Additional studies/ records that were reviewed today include: ***    ASSESSMENT AND PLAN:  Precordial chest pain:   ***  Her symptoms were atypical and in the setting of emotional stress. She had a normal Myoview within the last year and has no history of exertional symptoms. I reassured her that I had no evidence that her symptoms were cardiac. She is encouraged to manage her anxiety and if she has chest pain when she is not feeling anxious to contact us.  Hypertension:  ***  Her blood pressure was very elevated when she saw her PCP and it is elevated here today. Additionally, she describes some dyspnea on exertion. She has a long history of tobacco use and has emphysema so this is not clearly CHF. She has minimal volume overload on exam.  Education:  ***   Current medicines are reviewed at length with the patient today.  The patient does not have concerns regarding medicines.  The following changes have been made:   Increase Lasix and add amlodipine  Labs/ tests ordered today include:  No orders of the defined types were placed in this encounter.    Disposition:   FU with ***    Signed, Minus Breeding, MD  01/10/2020 8:41 PM

## 2020-01-11 ENCOUNTER — Ambulatory Visit: Payer: Medicare Other | Admitting: Cardiology

## 2020-01-12 ENCOUNTER — Other Ambulatory Visit: Payer: Self-pay

## 2020-01-12 ENCOUNTER — Other Ambulatory Visit (INDEPENDENT_AMBULATORY_CARE_PROVIDER_SITE_OTHER): Payer: Medicare Other

## 2020-01-12 ENCOUNTER — Other Ambulatory Visit: Payer: Self-pay | Admitting: Family

## 2020-01-12 ENCOUNTER — Encounter: Payer: Self-pay | Admitting: Family

## 2020-01-12 DIAGNOSIS — E559 Vitamin D deficiency, unspecified: Secondary | ICD-10-CM

## 2020-01-12 DIAGNOSIS — E538 Deficiency of other specified B group vitamins: Secondary | ICD-10-CM | POA: Diagnosis not present

## 2020-01-12 LAB — VITAMIN B12: Vitamin B-12: 397 pg/mL (ref 211–911)

## 2020-01-12 LAB — VITAMIN D 25 HYDROXY (VIT D DEFICIENCY, FRACTURES): VITD: 28.31 ng/mL — ABNORMAL LOW (ref 30.00–100.00)

## 2020-01-13 ENCOUNTER — Other Ambulatory Visit: Payer: Self-pay | Admitting: Family

## 2020-01-13 MED ORDER — VITAMIN D (ERGOCALCIFEROL) 1.25 MG (50000 UNIT) PO CAPS: 50000.0000 [IU] | ORAL_CAPSULE | ORAL | 0 refills | Status: AC

## 2020-01-24 ENCOUNTER — Other Ambulatory Visit: Payer: Self-pay | Admitting: Family

## 2020-01-24 MED ORDER — ESCITALOPRAM OXALATE 20 MG PO TABS: 20.0000 mg | ORAL_TABLET | Freq: Every day | ORAL | 1 refills | Status: DC

## 2020-01-25 ENCOUNTER — Institutional Professional Consult (permissible substitution): Payer: Medicare Other | Admitting: Pulmonary Disease

## 2020-01-26 ENCOUNTER — Ambulatory Visit: Payer: Medicare Other

## 2020-01-27 ENCOUNTER — Encounter: Payer: Self-pay | Admitting: Cardiology

## 2020-02-25 ENCOUNTER — Other Ambulatory Visit: Payer: Self-pay

## 2020-02-25 ENCOUNTER — Ambulatory Visit
Admission: RE | Admit: 2020-02-25 | Discharge: 2020-02-25 | Disposition: A | Discharge status: Home / Self Care | Payer: Medicare Other | Source: Ambulatory Visit | Attending: Family | Admitting: Family

## 2020-02-25 DIAGNOSIS — Z1231 Encounter for screening mammogram for malignant neoplasm of breast: Secondary | ICD-10-CM

## 2020-02-27 ENCOUNTER — Ambulatory Visit: Payer: Medicare Other | Attending: Internal Medicine

## 2020-02-27 DIAGNOSIS — Z23 Encounter for immunization: Secondary | ICD-10-CM | POA: Insufficient documentation

## 2020-02-27 NOTE — Progress Notes (Signed)
   Covid-19 Vaccination Clinic  Name:  Meredith [name]    MRN: [id number] DOB: 28-Aug-1951  02/27/2020  [name] was observed post Covid-19 immunization for 15 minutes without incidence. She was provided with Vaccine Information Sheet and instruction to access the V-Safe system.   [name] was instructed to call 911 with any severe reactions post vaccine: Marland Kitchen Difficulty breathing  . Swelling of your face and throat  . A fast heartbeat  . A bad rash all over your body  . Dizziness and weakness    Immunizations Administered    Name Date Dose VIS Date Route   Pfizer COVID-19 Vaccine 02/27/2020  1:11 PM 0.3 mL 12/10/2019 Intramuscular   Manufacturer: [place]   Lot: [id number]   [place]: [id number]

## 2020-03-08 ENCOUNTER — Encounter: Payer: Self-pay | Admitting: Family

## 2020-03-09 ENCOUNTER — Other Ambulatory Visit: Payer: Self-pay | Admitting: Family

## 2020-03-09 ENCOUNTER — Encounter: Payer: Self-pay | Admitting: Cardiology

## 2020-03-09 DIAGNOSIS — R0609 Other forms of dyspnea: Secondary | ICD-10-CM

## 2020-03-09 MED ORDER — METFORMIN HCL 1000 MG PO TABS: ORAL_TABLET | ORAL | 0 refills | Status: DC

## 2020-03-13 ENCOUNTER — Other Ambulatory Visit: Payer: Self-pay | Admitting: Family

## 2020-03-13 ENCOUNTER — Telehealth: Payer: Self-pay | Admitting: Family

## 2020-03-13 MED ORDER — ROSUVASTATIN CALCIUM 10 MG PO TABS: ORAL_TABLET | ORAL | 1 refills | Status: DC

## 2020-03-13 MED ORDER — IRBESARTAN 300 MG PO TABS: 300.0000 mg | ORAL_TABLET | Freq: Every day | ORAL | 1 refills | Status: DC

## 2020-03-13 NOTE — Telephone Encounter (Signed)
   1.Medication Requested:rosuvastatin (CRESTOR) 10 MG tablet  2. Pharmacy (Name[facility] Street[facility] City):[id number] [place] [place]  3. On Med List: yes  4. Last Visit with PCP: 11/17/19  5. Next visit date with PCP:   Agent: Please be advised that RX refills may take up to 3 business days. We ask that you follow-up with your pharmacy.

## 2020-03-21 ENCOUNTER — Encounter: Payer: Self-pay | Admitting: General Practice

## 2020-03-28 ENCOUNTER — Ambulatory Visit: Payer: Medicare Other | Attending: Internal Medicine

## 2020-03-28 DIAGNOSIS — Z23 Encounter for immunization: Secondary | ICD-10-CM

## 2020-03-28 NOTE — Progress Notes (Signed)
   Covid-19 Vaccination Clinic  Name:  Meredith [name]    MRN: [id number] DOB: November 14, 1951  03/28/2020  [name] was observed post Covid-19 immunization for 15 minutes without incident. She was provided with Vaccine Information Sheet and instruction to access the V-Safe system.   [name] was instructed to call 911 with any severe reactions post vaccine: Marland Kitchen Difficulty breathing  . Swelling of face and throat  . A fast heartbeat  . A bad rash all over body  . Dizziness and weakness   Immunizations Administered    Name Date Dose VIS Date Route   Pfizer COVID-19 Vaccine 03/28/2020  4:14 PM 0.3 mL 12/10/2019 Intramuscular   Manufacturer: [place]   Lot: [id number]   [place]: [id number]

## 2020-04-02 ENCOUNTER — Other Ambulatory Visit: Payer: Self-pay | Admitting: Family

## 2020-04-17 ENCOUNTER — Telehealth: Payer: Self-pay | Admitting: Family

## 2020-04-18 NOTE — Telephone Encounter (Signed)
Notified pt to contact pharmacy med was approved on yesterday, [facility]

## 2020-04-18 NOTE — Telephone Encounter (Signed)
New message:   Pt is calling and states she has been out of this medication since Friday. Just calling to check on the status of the refill.

## 2020-04-26 ENCOUNTER — Encounter: Payer: Self-pay | Admitting: Family

## 2020-04-28 ENCOUNTER — Other Ambulatory Visit: Payer: Self-pay | Admitting: Family

## 2020-04-28 DIAGNOSIS — R0609 Other forms of dyspnea: Secondary | ICD-10-CM

## 2020-05-04 ENCOUNTER — Encounter: Payer: Self-pay | Admitting: Family

## 2020-05-11 NOTE — Progress Notes (Signed)
Cardiology Office Note   Date:  05/12/2020   ID:  Fynn, Vanweelden [birth year], MRN [id number]  PCP:  Marrian Salvage, [place]  Cardiologist:   No primary care provider on file. Referring:  Marrian Salvage, [place]  Chief Complaint  Patient presents with  . Fatigue  . Shortness of Breath      History of Present Illness: Meredith [name] is a 69 y.o. female who is referred by Marrian Salvage, [place] for evaluation of dyspnea and fatigue.   We saw her greater than 3 years ago for DOE and HTN.  She had a negative Lexiscan Myoview.  POET (Plain Old Exercise Treadmill) was inconclusive.    She has had progressive fatigue.  She gets tired with just about any activity.  She has been anemic and is on B12 shots.  She also has sleep apnea and is going to have a reevaluation.  She wakes up tired.  She does not do a lot of activities being limited somewhat by knee pain.  She does some grocery shopping and takes care of some grandchildren.  She has some vacuuming.  This kind of activity will make her short of breath.  She is not describing resting shortness of breath, PND or orthopnea.  She has not been having any palpitations, presyncope or syncope.  She does not get overt chest pressure, neck or arm discomfort.  Past Medical History:  Diagnosis Date  . Anemia   . Anxiety state, unspecified   . Arthritis    KNEES  . B12 deficiency anemia 05/2014 dx  . CIN I (cervical intraepithelial neoplasia I)   . Depressive disorder, not elsewhere classified   . Diabetes mellitus without complication ([place])   . Emphysema of lung ([place])   . Hypertension   . Internal hemorrhoids   . Other and unspecified hyperlipidemia   . Unspecified sleep apnea     Past Surgical History:  Procedure Laterality Date  . CERVICAL CERCLAGE    . CESAREAN SECTION    . COLONOSCOPY    . HEMORRHOID SURGERY    . KNEE ARTHROSCOPY Left 12/2014  . UPPER GASTROINTESTINAL ENDOSCOPY       Current Outpatient Medications    Medication Sig Dispense Refill  . ALPRAZolam (NIRAVAM) 0.5 MG dissolvable tablet Take 1 tablet (0.5 mg total) by mouth 2 (two) times daily as needed for anxiety. 60 tablet 0  . amLODipine (NORVASC) 2.5 MG tablet TAKE 1 TABLET(2.5 MG) BY MOUTH DAILY 90 tablet 1  . aspirin 81 MG tablet Take 81 mg by mouth daily.      Marland Kitchen augmented betamethasone dipropionate (DIPROLENE-AF) 0.05 % cream Apply 1 application topically as needed.  6  . calcipotriene (DOVONOX) 0.005 % cream calcipotriene 0.005 % topical cream  APP THIN LAYER AA BID PRN    . escitalopram (LEXAPRO) 20 MG tablet Take 1 tablet (20 mg total) by mouth daily. 90 tablet 1  . folic acid (FOLVITE) A999333 MCG tablet Take 400 mcg by mouth daily.    . furosemide (LASIX) 20 MG tablet Take 1 tablet (20 mg total) by mouth 2 (two) times daily. 180 tablet 1  . irbesartan (AVAPRO) 300 MG tablet Take 1 tablet (300 mg total) by mouth daily. 90 tablet 1  . linagliptin (TRADJENTA) 5 MG TABS tablet Take 1 tablet (5 mg total) by mouth daily. 90 tablet 1  . metFORMIN (GLUCOPHAGE) 1000 MG tablet TAKE 1 TABLET BY MOUTH TWICE DAILY WITH A MEAL 180 tablet  0  . nystatin-triamcinolone ointment (MYCOLOG) APPLY TO AFFECTED AREA ON THE SKIN TWICE A DAY    . prednisoLONE acetate (PRED FORTE) 1 % ophthalmic suspension prednisolone acetate 1 % eye drops,suspension    . rosuvastatin (CRESTOR) 10 MG tablet TAKE 1 TABLET(10 MG) BY MOUTH DAILY 90 tablet 1   No current facility-administered medications for this visit.    Allergies:   Patient has no known allergies.    Social History:  The patient  reports that she has quit smoking. Her smoking use included cigarettes. She has a 30.00 pack-year smoking history. She has never used smokeless tobacco. She reports current alcohol use of about 5.0 - 6.0 standard drinks of alcohol per week. She reports that she does not use drugs.   Family History:  The patient's family history includes Cancer in her father; Diabetes in her father;  Heart attack (age of onset: 48) in her father; Heart disease in her father; Multiple sclerosis in her mother.    ROS:  Please see the history of present illness.   Otherwise, review of systems are positive for none.   All other systems are reviewed and negative.    PHYSICAL EXAM: VS:  BP 127/72   Pulse 76   Temp (!) 97.5 F (36.4 C)   Resp 18   Ht 5\' 5"  (1.651 m)   Wt 225 lb (102.1 kg)   LMP 12/30/1998 (Approximate)   SpO2 98%   BMI 37.44 kg/m  , BMI Body mass index is 37.44 kg/m. GENERAL:  Well appearing HEENT:  Pupils equal round and reactive, fundi not visualized, oral mucosa unremarkable NECK:  No jugular venous distention, waveform within normal limits, carotid upstroke brisk and symmetric, no bruits, no thyromegaly LYMPHATICS:  No cervical, inguinal adenopathy LUNGS:  Clear to auscultation bilaterally BACK:  No CVA tenderness CHEST:  Unremarkable HEART:  PMI not displaced or sustained,S1 and S2 within normal limits, no S3, no S4, no clicks, no rubs, no murmurs ABD:  Flat, positive bowel sounds normal in frequency in pitch, no bruits, no rebound, no guarding, no midline pulsatile mass, no hepatomegaly, no splenomegaly EXT:  2 plus pulses throughout, no edema, no cyanosis no clubbing SKIN:  No rashes no nodules NEURO:  Cranial nerves II through XII grossly intact, motor grossly intact throughout PSYCH:  Cognitively intact, oriented to person place and time    EKG:  EKG is ordered today. The ekg ordered today demonstrates sinus rhythm, rate 73, axis within normal limits, intervals within normal limits, no acute ST-T wave changes.   Recent Labs: 10/12/2019: TSH 2.62 11/15/2019: ALT 11; BUN 11; Creatinine, Ser 0.68; Hemoglobin 10.6; Platelets 237.0; Potassium 3.8; Sodium 138    Lipid Panel    Component Value Date/Time   CHOL 174 05/20/2019 1521   TRIG 180.0 (H) 05/20/2019 1521   HDL 43.80 05/20/2019 1521   CHOLHDL 4 05/20/2019 1521   VLDL 36.0 05/20/2019 1521    LDLCALC 94 05/20/2019 1521   LDLDIRECT 109.0 09/04/2018 1605      Wt Readings from Last 3 Encounters:  05/12/20 225 lb (102.1 kg)  11/17/19 218 lb 9.6 oz (99.2 kg)  03/19/19 217 lb (98.4 kg)      Other studies Reviewed: Additional studies/ records that were reviewed today include: Labs. Review of the above records demonstrates:  Please see elsewhere in the note.     ASSESSMENT AND PLAN:  DOE:   Given this and her significant cardiovascular risk factors she needs to be screened for  obstructive coronary disease.  However, she could not walk on a treadmill.  Therefore, she will have a [employer].  HTN: Her blood pressure is better controlled.  No change in therapy.  DM: A1c is 7.3.  Is followed closely by her primary provider.  SLEEP APNEA: She is due to have follow-up with a sleep doctor.  She is currently using CPAP.  FATIGUE: The etiology of this is not entirely clear though it could be related to sleep apnea.  She has had extensive work-up and I did see that thyroid studies wer normal late last year.  She does report anemia.  No further cardiac work-up at this point.  COVID EDUCATION: She has had her vaccines.  Current medicines are reviewed at length with the patient today.  The patient does not have concerns regarding medicines.  The following changes have been made:  no change  Labs/ tests ordered today include:   Orders Placed This Encounter  Procedures  . MYOCARDIAL PERFUSION IMAGING  . EKG 12-Lead     Disposition:   FU with me in about 18 months.      Signed, Minus Breeding, MD  05/12/2020 4:00 PM    [place]

## 2020-05-12 ENCOUNTER — Encounter: Payer: Self-pay | Admitting: Cardiology

## 2020-05-12 ENCOUNTER — Ambulatory Visit (INDEPENDENT_AMBULATORY_CARE_PROVIDER_SITE_OTHER): Payer: Medicare Other | Admitting: Cardiology

## 2020-05-12 ENCOUNTER — Other Ambulatory Visit: Payer: Self-pay

## 2020-05-12 VITALS — BP 127/72 | HR 76 | Temp 97.5°F | Resp 18 | Ht 65.0 in | Wt 225.0 lb

## 2020-05-12 DIAGNOSIS — R0600 Dyspnea, unspecified: Secondary | ICD-10-CM | POA: Diagnosis not present

## 2020-05-12 DIAGNOSIS — I10 Essential (primary) hypertension: Secondary | ICD-10-CM

## 2020-05-12 DIAGNOSIS — E119 Type 2 diabetes mellitus without complications: Secondary | ICD-10-CM

## 2020-05-12 DIAGNOSIS — Z7189 Other specified counseling: Secondary | ICD-10-CM

## 2020-05-12 DIAGNOSIS — R0609 Other forms of dyspnea: Secondary | ICD-10-CM

## 2020-05-12 NOTE — Patient Instructions (Signed)
Medication Instructions:  Your physician recommends that you continue on your current medications as directed. Please refer to the Current Medication list given to you today.  *If you need a refill on your cardiac medications before your next appointment, please call your pharmacy*  Lab Work: NONE ordered at this time of appointment   If you have labs (blood work) drawn today and your tests are completely normal, you will receive your results only by: Marland Kitchen MyChart Message (if you have MyChart) OR . A paper copy in the mail If you have any lab test that is abnormal or we need to change your treatment, we will call you to review the results.  Testing/Procedures: Your physician has requested that you have a lexiscan myoview. For further information please visit [email or web address]. Please follow instruction sheet, as given.   PLEASE SCHEDULE FOR 2-3 WEEKS   Follow-Up: At [facility], you and your health needs are our priority.  As part of our continuing mission to provide you with exceptional heart care, we have created designated Provider Care Teams.  These Care Teams include your primary Cardiologist (physician) and Advanced Practice Providers (APPs -  Physician Assistants and Nurse Practitioners) who all work together to provide you with the care you need, when you need it.  Your next appointment:   18 month(s)  The format for your next appointment:   In Person  Provider:   Minus Breeding, MD  Other Instructions

## 2020-05-24 ENCOUNTER — Other Ambulatory Visit: Payer: Self-pay | Admitting: Family

## 2020-05-24 DIAGNOSIS — I10 Essential (primary) hypertension: Secondary | ICD-10-CM

## 2020-05-30 ENCOUNTER — Telehealth (HOSPITAL_COMMUNITY): Payer: Self-pay

## 2020-05-30 NOTE — Telephone Encounter (Signed)
Encounter complete. 

## 2020-06-01 ENCOUNTER — Encounter: Payer: Self-pay | Admitting: *Deleted

## 2020-06-01 ENCOUNTER — Other Ambulatory Visit: Payer: Self-pay

## 2020-06-01 ENCOUNTER — Ambulatory Visit (HOSPITAL_COMMUNITY)
Admission: RE | Admit: 2020-06-01 | Discharge: 2020-06-01 | Disposition: A | Discharge status: Home / Self Care | Payer: Medicare Other | Source: Ambulatory Visit | Attending: Cardiology | Admitting: Cardiology

## 2020-06-01 DIAGNOSIS — R0609 Other forms of dyspnea: Secondary | ICD-10-CM

## 2020-06-02 ENCOUNTER — Other Ambulatory Visit: Payer: Self-pay | Admitting: Family

## 2020-06-02 ENCOUNTER — Ambulatory Visit (HOSPITAL_COMMUNITY): Payer: Medicare Other

## 2020-06-03 ENCOUNTER — Encounter: Payer: Self-pay | Admitting: Internal Medicine

## 2020-06-05 ENCOUNTER — Other Ambulatory Visit: Payer: Self-pay

## 2020-06-05 ENCOUNTER — Encounter: Payer: Self-pay | Admitting: Internal Medicine

## 2020-06-05 ENCOUNTER — Ambulatory Visit (INDEPENDENT_AMBULATORY_CARE_PROVIDER_SITE_OTHER): Payer: Medicare Other | Admitting: Internal Medicine

## 2020-06-05 VITALS — BP 120/70 | HR 77 | Temp 97.3°F | Ht 64.0 in | Wt 223.8 lb

## 2020-06-05 DIAGNOSIS — J438 Other emphysema: Secondary | ICD-10-CM

## 2020-06-05 DIAGNOSIS — I700 Atherosclerosis of aorta: Secondary | ICD-10-CM

## 2020-06-05 DIAGNOSIS — G4733 Obstructive sleep apnea (adult) (pediatric): Secondary | ICD-10-CM

## 2020-06-05 NOTE — Patient Instructions (Signed)
Order- DME [facility]-  Continue CPAP 12, mask of choice, humidifier supplies, Airview/ card  Walk as much as you can to help your stamina.   I hope your cardiac test goes well.  Please call if we can help

## 2020-06-05 NOTE — Progress Notes (Signed)
06/05/20- 69 yoF former smoker to establish for sleep evaluation. Had been followed for OSA by Dr Elsworth Soho, last seen in 2017. Medical problem list includes Osteoarthritis, Depression, DM2, Hyperlipidemia, Lung Nodule, Working as substance abuse counselor. Divorced, lives alone NPSG 03/29/04- AHI 51.4/ hr, 78%, body weight 207 lbs CPAP 12/ [facility]. Machine is 70 yrs old, nasal pillows Download compliance 93%, AHI 2.7/ hr Body weight today 223 lbs Epworth score 6 Meds include alprazolam 0.5,  Bedtime 12MN-1:30 AM, Up 7:30-8AM. CT low dose screening program. DOE stable. Denies cough or wheeze and doesn't feel she needs inhaler.  PFT in 2015 showed only minimal reduction of Diffusion.  CT chest-08/17/2019- IMPRESSION: 1. Lung-RADS 2, benign appearance or behavior. Continue annual screening with low-dose chest CT without contrast in 12 months. 2. Aortic atherosclerosis (ICD10-170.0). Coronary artery calcification. 3.  Emphysema (ICD10-J43.9).  Prior to Admission medications   Medication Sig Start Date End Date Taking? Authorizing Provider  ALPRAZolam (NIRAVAM) 0.5 MG dissolvable tablet Take 1 tablet (0.5 mg total) by mouth 2 (two) times daily as needed for anxiety. 07/13/19  Yes Marrian Salvage, FNP  amLODipine (NORVASC) 2.5 MG tablet TAKE 1 TABLET(2.5 MG) BY MOUTH DAILY 04/17/20  Yes Marrian Salvage, FNP  aspirin 81 MG tablet Take 81 mg by mouth daily.     Yes [provider]  Biotin 1000 MCG tablet  04/29/20  Yes [provider]  escitalopram (LEXAPRO) 20 MG tablet Take 1 tablet (20 mg total) by mouth daily. 01/24/20  Yes Marrian Salvage, FNP  folic acid (FOLVITE) 268 MCG tablet Take 400 mcg by mouth daily.   Yes [provider]  furosemide (LASIX) 20 MG tablet TAKE 1 TABLET BY MOUTH TWICE A DAY 05/24/20  Yes Marrian Salvage, FNP  irbesartan (AVAPRO) 300 MG tablet Take 1 tablet (300 mg total) by mouth daily. 03/13/20  Yes Marrian Salvage, FNP   metFORMIN (GLUCOPHAGE) 1000 MG tablet TAKE 1 TABLET BY MOUTH TWICE DAILY WITH A MEAL 03/09/20  Yes Marrian Salvage, FNP  rosuvastatin (CRESTOR) 10 MG tablet TAKE 1 TABLET(10 MG) BY MOUTH DAILY 03/13/20  Yes Marrian Salvage, FNP  TRADJENTA 5 MG TABS tablet TAKE 1 TABLET BY MOUTH EVERY DAY 06/02/20  Yes Marrian Salvage, FNP  Turmeric (QC TUMERIC COMPLEX) 500 MG CAPS  04/29/20  Yes [provider]   Past Medical History:  Diagnosis Date  . Anemia   . Anxiety state, unspecified   . Arthritis    KNEES  . B12 deficiency anemia 05/2014 dx  . CIN I (cervical intraepithelial neoplasia I)   . Depressive disorder, not elsewhere classified   . Diabetes mellitus without complication ([place])   . Emphysema of lung ([place])   . Hypertension   . Internal hemorrhoids   . Other and unspecified hyperlipidemia   . Unspecified sleep apnea    Past Surgical History:  Procedure Laterality Date  . CERVICAL CERCLAGE    . CESAREAN SECTION    . COLONOSCOPY    . HEMORRHOID SURGERY    . KNEE ARTHROSCOPY Left 12/2014  . UPPER GASTROINTESTINAL ENDOSCOPY     Family History  Problem Relation Age of Onset  . Heart attack Father 57  . Diabetes Father   . Cancer Father        lung  . Heart disease Father   . Multiple sclerosis Mother   . Breast cancer Neg Hx    Social History   Socioeconomic History  . Marital status:  Divorced    Spouse name: Not on file  . Number of children: 3  . Years of education: 76  . Highest education level: Not on file  Occupational History  . 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 Smoking status: Former Smoker    Packs/day: 1.00    Years: 30.00    Pack years: 30.00    Types: Cigarettes  . Smokeless tobacco: Never Used  Substance and Sexual Activity  . Alcohol use: Yes    Alcohol/week: 5.0 - 6.0 standard drinks    Types: 5 - 6 Standard drinks or equivalent per week  . Drug use: No  . Sexual  activity: Never    Birth control/protection: Post-menopausal  Other Topics Concern  . Not on file  Social History Narrative   2 year training [place] in substance abuse counseling. BA- psych from [employer]. Married - 14 years -divorced. 3 daughters - twins 19 - one dtr,Charlotte, died 12-Apr-2012 unknown causes, 8   Work: Biomedical engineer substance abuse Social worker. No h/o physical or sexual abuse   Social Determinants of Radio broadcast assistant Strain:   . Difficulty of Paying Living Expenses:   Food Insecurity:   . Worried About Charity fundraiser in the Last Year:   . Arboriculturist in the Last Year:   Transportation Needs:   . Film/video editor (Medical):   Marland Kitchen Lack of Transportation (Non-Medical):   Physical Activity:   . Days of Exercise per Week:   . Minutes of Exercise per Session:   Stress:   . Feeling of Stress :   Social Connections:   . Frequency of Communication with Friends and Family:   . Frequency of Social Gatherings with Friends and Family:   . Attends Religious Services:   . Active Member of Clubs or Organizations:   . Attends Archivist Meetings:   Marland Kitchen Marital Status:   Intimate Partner Violence:   . Fear of Current or Ex-Partner:   . Emotionally Abused:   Marland Kitchen Physically Abused:   . Sexually Abused:    ROS-see HPI  + = positive Constitutional:    weight loss, night sweats, fevers, chills, fatigue, lassitude. HEENT:    headaches, difficulty swallowing, tooth/dental problems, sore throat,       sneezing, itching, ear ache, nasal congestion, post nasal drip, snoring CV:    chest pain, orthopnea, PND, +swelling in lower extremities, anasarca,                                   dizziness, palpitations Resp:   +shortness of breath with exertion or at rest.                productive cough,   non-productive cough, coughing up of blood.              change in color of mucus.  wheezing.   Skin:    rash or lesions. GI:  No-   heartburn,  indigestion, abdominal pain, nausea, vomiting, diarrhea,                 change in bowel habits, loss of appetite GU: dysuria, change in color of urine, no urgency or frequency.   flank pain. MS:   +joint pain, stiffness, decreased range of motion, back pain. Neuro-     nothing unusual Psych:  change in mood or affect.  depression or +anxiety.   memory loss.  OBJ- Physical Exam General- Alert, Oriented, Affect-appropriate, Distress- none acute, + obese Skin- rash-none, lesions- none, excoriation- none Lymphadenopathy- none Head- atraumatic            Eyes- Gross vision intact, PERRLA, conjunctivae and secretions clear            Ears- Hearing, canals-normal            Nose- Clear, no-Septal dev, mucus, polyps, erosion, perforation             Throat- Mallampati IV , mucosa clear , drainage- none, tonsils- atrophic, + own teeth Neck- flexible , trachea midline, no stridor , thyroid nl, carotid no bruit Chest - symmetrical excursion , unlabored           Heart/CV- RRR , no murmur , no gallop  , no rub, nl s1 s2                           - JVD- none , edema- none, stasis changes- none, varices- none           Lung- clear to P&A, wheeze- none, cough- none , dullness-none, rub- none           Chest wall-  Abd-  Br/ Gen/ Rectal- Not done, not indicated Extrem- cyanosis- none, clubbing, none, atrophy- none, strength- nl Neuro- grossly intact to observation

## 2020-06-05 NOTE — Assessment & Plan Note (Signed)
Exam is clear. She is aware of DOE only with sustained walking. Appropriate to stay off inhalers for now. Encourage walking for weight and stamina. Note cardiac evaluation pending. She has aortic atherosclerosis on CT.

## 2020-06-05 NOTE — Assessment & Plan Note (Signed)
Cardiac workup pending.

## 2020-06-05 NOTE — Assessment & Plan Note (Signed)
Benefits from CPAP with good compliance and control Plan- continue CPAP 12 for now. When machine is due for replacement, consdier change to autopap 5-15. Discuss sleep habits.

## 2020-06-08 ENCOUNTER — Telehealth (HOSPITAL_COMMUNITY): Payer: Self-pay

## 2020-06-08 NOTE — Telephone Encounter (Signed)
Encounter complete. 

## 2020-06-13 ENCOUNTER — Ambulatory Visit (HOSPITAL_COMMUNITY)
Admission: RE | Admit: 2020-06-13 | Discharge: 2020-06-13 | Disposition: A | Discharge status: Home / Self Care | Payer: Medicare Other | Source: Ambulatory Visit | Attending: Cardiology | Admitting: Cardiology

## 2020-06-13 ENCOUNTER — Other Ambulatory Visit: Payer: Self-pay

## 2020-06-13 DIAGNOSIS — R0600 Dyspnea, unspecified: Secondary | ICD-10-CM | POA: Diagnosis not present

## 2020-06-13 MED ORDER — REGADENOSON 0.4 MG/5ML IV SOLN
0.4000 mg | Freq: Once | INTRAVENOUS | Status: AC
  Administered 2020-06-13: 0.4 mg via INTRAVENOUS

## 2020-06-13 MED ORDER — TECHNETIUM TC 99M TETROFOSMIN IV KIT
29.8000 | PACK | Freq: Once | INTRAVENOUS | Status: AC | PRN
  Administered 2020-06-13: 29.8 via INTRAVENOUS
  Filled 2020-06-13: qty 30

## 2020-06-14 ENCOUNTER — Ambulatory Visit (HOSPITAL_COMMUNITY)
Admission: RE | Admit: 2020-06-14 | Discharge: 2020-06-14 | Disposition: A | Discharge status: Home / Self Care | Payer: Medicare Other | Source: Ambulatory Visit | Attending: Cardiovascular Disease | Admitting: Cardiovascular Disease

## 2020-06-14 LAB — MYOCARDIAL PERFUSION IMAGING
LV dias vol: 114 mL (ref 46–106)
LV sys vol: 44 mL
Peak HR: 94 {beats}/min
Rest HR: 66 {beats}/min
SDS: 0
SRS: 0
SSS: 0
TID: 0.97

## 2020-06-14 MED ORDER — TECHNETIUM TC 99M TETROFOSMIN IV KIT
31.6000 | PACK | Freq: Once | INTRAVENOUS | Status: AC | PRN
  Administered 2020-06-14: 31.6 via INTRAVENOUS

## 2020-06-20 ENCOUNTER — Other Ambulatory Visit: Payer: Self-pay | Admitting: Family

## 2020-07-26 ENCOUNTER — Encounter: Payer: Self-pay | Admitting: Orthopaedic Surgery

## 2020-07-26 ENCOUNTER — Ambulatory Visit (INDEPENDENT_AMBULATORY_CARE_PROVIDER_SITE_OTHER): Payer: Medicare Other

## 2020-07-26 ENCOUNTER — Ambulatory Visit (INDEPENDENT_AMBULATORY_CARE_PROVIDER_SITE_OTHER): Payer: Medicare Other | Admitting: Orthopaedic Surgery

## 2020-07-26 DIAGNOSIS — G8929 Other chronic pain: Secondary | ICD-10-CM | POA: Diagnosis not present

## 2020-07-26 DIAGNOSIS — M25562 Pain in left knee: Secondary | ICD-10-CM

## 2020-07-26 NOTE — Progress Notes (Signed)
Office Visit Note   Patient: Meredith [name]           Date of Birth: Aug 08, 1951           MRN: [id number] Visit Date: 07/26/2020              Requested by: Marrian Salvage, [place],  [place] [id number] PCP: Marrian Salvage, FNP   Assessment & Plan: Visit Diagnoses:  1. Chronic pain of left knee     Plan: Patient might proceed with left total knee arthroplasty.  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  We discussed home physical therapy.  She has children who would be available to come stay with her after the surgery to avoid skilled facility placement.  We discussed using a walker doing some straight leg raising.  Outpatient therapy discussed in detail questions were elicited and answered.  Patient understands request to proceed.  Follow-Up Instructions: No follow-ups on file.   Orders:  Orders Placed This Encounter  Procedures  . XR Knee 1-2 Views Left   No orders of the defined types were placed in this encounter.     Procedures: No procedures performed   Clinical Data: No additional findings.   Subjective: Chief Complaint  Patient presents with  . Left Knee - Pain    HPI 69 year old female returns with ongoing left knee pain and inability to reach full extension.  Difficulty walking particularly at the end of the day and then in the evenings she has swelling she has had the fall 2018.  She uses icy hot plus Aleve has had previous cortisone injections and in the distant past knee arthroscopy with medial lateral meniscal tears.  Previous MRI 5 years ago 2016 left knee showed tricompartmental cartilage abnormality with medial patellofemoral bone-on-bone changes.  Plain radiographs show loss of joint space marginal osteophytes subchondral sclerosis flattening of the femoral condyles.  Patient asked about repeat arthroscopy we discussed and reviewed with her the  previous studies he understands that her arthritis is severe enough that total knee arthroplasty is indicated and arthroscopy is unlikely to give her significant relief.  Review of Systems positive for type 2 diabetes on oral medication.  Positive hypertension hyperlipidemia.   Objective: Vital Signs: LMP 12/30/1998 (Approximate)  Objective: Vital Signs: Ht 5\' 5"  (1.651 m)   Wt 217 lb (98.4 kg)   LMP 12/30/1998 (Approximate)   BMI 36.11 kg/m  Physical Exam Constitutional:      Appearance: She is well-developed.  HENT:     Head: Normocephalic.     Right Ear: External ear normal.     Left Ear: External ear normal.  Eyes:     Pupils: Pupils are equal, round, and reactive to light.  Neck:     Thyroid: No thyromegaly.     Trachea: No tracheal deviation.  Cardiovascular:     Rate and Rhythm: Normal rate.  Pulmonary:     Effort: Pulmonary effort is normal.  Abdominal:     Palpations: Abdomen is soft.  Skin:    General: Skin is warm and dry.  Neurological:     Mental Status: She is alert and oriented to person, place, and time.  Psychiatric:        Behavior: Behavior normal.     Ortho Exam patient has bilateral knee crepitus with flexion-extension.  She ambulates with bent knee gait does not reach  full extension right left knee lacks 10 degrees.  Medial lateral joint line tenderness prominent over the pes bursa which may be some subcutaneous adipose accumulation versus pes bursal swelling.  Hamstrings are normal palpation no Baker's cyst.  Negative logroll to the hips distal pulses are intact.  Specialty Comments:  No specialty comments available.  Imaging: No results found.   PMFS History: Patient Active Problem List   Diagnosis Date Noted  . Aortic atherosclerosis ([place]) 06/05/2020  . Chest pain of uncertain etiology 75/64/3329  . Educated about COVID-19 virus infection 01/10/2020  . Bilateral primary osteoarthritis of knee 03/19/2019  . B12 deficiency anemia   .  Other emphysema ([place]) 02/23/2014  . 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[place]) 12/31/2013  . Essential hypertension, benign 11/05/2013  . Routine health maintenance 09/27/2012  . Vitamin D deficiency 06/20/2011  . OBESITY, CLASS II 03/18/2011  . PULMONARY NODULE 07/12/2010  . Diabetes mellitus type 2, controlled, without complications ([place]) 51/88/4166  . Hyperlipidemia 10/01/2007  . ANXIETY 10/01/2007  . Depression 10/01/2007  . OSA (obstructive sleep apnea) 10/01/2007   Past Medical History:  Diagnosis Date  . Anemia   . Anxiety state, unspecified   . Arthritis    KNEES  . B12 deficiency anemia 05/2014 dx  . CIN I (cervical intraepithelial neoplasia I)   . Depressive disorder, not elsewhere classified   . Diabetes mellitus without complication ([place])   . Emphysema of lung ([place])   . Hypertension   . Internal hemorrhoids   . Other and unspecified hyperlipidemia   . Unspecified sleep apnea     Family History  Problem Relation Age of Onset  . Heart attack Father 40  . Diabetes Father   . Cancer Father        lung  . Heart disease Father   . Multiple sclerosis Mother   . Breast cancer Neg Hx     Past Surgical History:  Procedure Laterality Date  . CERVICAL CERCLAGE    . CESAREAN SECTION    . COLONOSCOPY    . HEMORRHOID SURGERY    . KNEE ARTHROSCOPY Left 12/2014  . UPPER GASTROINTESTINAL ENDOSCOPY     Social History   Occupational History  . 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 Smoking status: Former Smoker    Packs/day: 1.00    Years: 30.00    Pack years: 30.00    Types: Cigarettes  . Smokeless tobacco: Never Used  Vaping Use  . Vaping Use: Never used  Substance and Sexual Activity  . Alcohol use: Yes    Alcohol/week: 5.0 - 6.0 standard drinks    Types: 5 - 6 Standard drinks or equivalent per week  . Drug use: No  . Sexual activity: Never    Birth control/protection: Post-menopausal

## 2020-07-31 ENCOUNTER — Other Ambulatory Visit: Payer: Self-pay | Admitting: Family

## 2020-08-21 ENCOUNTER — Encounter: Payer: Self-pay | Admitting: Family

## 2020-08-23 ENCOUNTER — Other Ambulatory Visit: Payer: Self-pay | Admitting: Family

## 2020-08-23 DIAGNOSIS — Z0184 Encounter for antibody response examination: Secondary | ICD-10-CM

## 2020-08-28 ENCOUNTER — Other Ambulatory Visit: Payer: Self-pay | Admitting: Family

## 2020-09-12 ENCOUNTER — Telehealth: Payer: Self-pay | Admitting: *Deleted

## 2020-09-12 NOTE — Telephone Encounter (Signed)
° °  [facility] Pre-operative Risk Assessment      Request for surgical clearance:  1. What type of surgery is being performed? LEFT TOTAL KNEE  2. When is this surgery scheduled? TBD  3. What type of clearance is required (medical clearance vs. Pharmacy clearance to hold med vs. Both)? MEDICAL  4. Are there any medications that need to be held prior to surgery and how long?ASPIRIN  5. Practice name and name of physician performing surgery? [facility] ; DR MARK YATES  6. What is the office phone number?  [phone]   7.   What is the office fax number? [place]   Anesthesia type (None, local, MAC, general) ?  SPINAL   Raiford Simmonds 09/12/2020, 5:36 PM  _________________________________________________________________   (provider comments below)

## 2020-09-13 NOTE — Telephone Encounter (Signed)
   Primary Cardiologist: No primary care provider on file.  Chart reviewed as part of pre-operative protocol coverage. Patient was contacted 09/13/2020 in reference to pre-operative risk assessment for pending surgery as outlined below.  [name] reports she is unsure about when or if she would like to proceed with surgery at this time. I asked that she communicate with the ortho office to determine when she would consider having this done. Asked that the office reach out to our office once a more definitive plan is made.    I will route this recommendation to the requesting party via Epic fax function and remove from pre-op pool. Please call with questions.  Reino Bellis, NP 09/13/2020, 4:39 PM

## 2020-09-15 ENCOUNTER — Telehealth: Payer: Self-pay | Admitting: Family

## 2020-09-15 NOTE — Telephone Encounter (Signed)
Message left for patient to return call to clinic to schedule surgical clearance.

## 2020-09-15 NOTE — Telephone Encounter (Signed)
She will need an OV for her orthopedic clearance; hasn't been seen since November 2020;

## 2020-09-21 ENCOUNTER — Telehealth: Payer: Self-pay | Admitting: Orthopaedic Surgery

## 2020-09-21 NOTE — Telephone Encounter (Signed)
Received vm from patient, stating needs records faxed to [facility]. IC,lmvm advised we need signed authoriztion before we can send records. [phone]

## 2020-10-03 ENCOUNTER — Telehealth: Payer: Self-pay | Admitting: Orthopaedic Surgery

## 2020-10-03 NOTE — Telephone Encounter (Signed)
Received vm from pt stating still needs records sent to [facility]. IC,lmvm advised that I called 9/23 and left msg advising that we need signed authorization before we can send records and we haven't receved auth yet. Advised if she needs [place] to send the form to her to let us know, otherwise, come by office and complete this release form.

## 2020-10-10 DIAGNOSIS — Z23 Encounter for immunization: Secondary | ICD-10-CM | POA: Diagnosis not present

## 2020-10-11 ENCOUNTER — Telehealth: Payer: Self-pay | Admitting: Family

## 2020-10-11 NOTE — Telephone Encounter (Signed)
LVM for pt to rtn my call to R/S appt with [facility]

## 2020-10-16 ENCOUNTER — Encounter: Payer: Medicare Other | Admitting: Family

## 2020-10-17 ENCOUNTER — Other Ambulatory Visit: Payer: Self-pay | Admitting: Family

## 2020-10-18 ENCOUNTER — Telehealth: Payer: Self-pay | Admitting: Orthopaedic Surgery

## 2020-10-18 NOTE — Telephone Encounter (Signed)
Received vm from patient stating [facility] still has not received her records. She stated she faxed release. IC,lmvm advised we have not received anything. Advised that I have left her vm advising that we do need auth form before we can release records,also that if she needed [place] to send her form to let us know as we can fax or email to her.I reiterated this. And will certainly send records as soon as we received valid authorization.

## 2020-10-19 ENCOUNTER — Telehealth: Payer: Self-pay | Admitting: Orthopedic Surgery

## 2020-10-19 NOTE — Telephone Encounter (Signed)
Ms. Menard needs to pick up x-rays pertaining to her left knee.  Please call patient when they are ready to pick up.  Release is under the media tab in Ms. Laurich's chart.  There should be no charge for the disc.  Thank you!

## 2020-10-20 NOTE — Telephone Encounter (Signed)
Left voicemail that CD is ready for pick up

## 2020-10-26 DIAGNOSIS — Z23 Encounter for immunization: Secondary | ICD-10-CM | POA: Diagnosis not present

## 2020-10-31 ENCOUNTER — Encounter: Payer: Self-pay | Admitting: Family

## 2020-10-31 ENCOUNTER — Ambulatory Visit (INDEPENDENT_AMBULATORY_CARE_PROVIDER_SITE_OTHER): Payer: Medicare Other | Admitting: Family

## 2020-10-31 ENCOUNTER — Other Ambulatory Visit: Payer: Self-pay

## 2020-10-31 VITALS — BP 136/74 | HR 64 | Temp 98.5°F | Ht 65.0 in | Wt 221.4 lb

## 2020-10-31 DIAGNOSIS — E559 Vitamin D deficiency, unspecified: Secondary | ICD-10-CM

## 2020-10-31 DIAGNOSIS — E669 Obesity, unspecified: Secondary | ICD-10-CM

## 2020-10-31 DIAGNOSIS — E119 Type 2 diabetes mellitus without complications: Secondary | ICD-10-CM

## 2020-10-31 DIAGNOSIS — D649 Anemia, unspecified: Secondary | ICD-10-CM | POA: Diagnosis not present

## 2020-10-31 DIAGNOSIS — F419 Anxiety disorder, unspecified: Secondary | ICD-10-CM

## 2020-10-31 DIAGNOSIS — E1169 Type 2 diabetes mellitus with other specified complication: Secondary | ICD-10-CM

## 2020-10-31 DIAGNOSIS — R5383 Other fatigue: Secondary | ICD-10-CM

## 2020-10-31 DIAGNOSIS — I10 Essential (primary) hypertension: Secondary | ICD-10-CM | POA: Diagnosis not present

## 2020-10-31 LAB — COMPREHENSIVE METABOLIC PANEL
ALT: 11 U/L (ref 0–35)
AST: 16 U/L (ref 0–37)
Albumin: 3.9 g/dL (ref 3.5–5.2)
Alkaline Phosphatase: 71 U/L (ref 39–117)
BUN: 12 mg/dL (ref 6–23)
CO2: 26 mEq/L (ref 19–32)
Calcium: 9.2 mg/dL (ref 8.4–10.5)
Chloride: 103 mEq/L (ref 96–112)
Creatinine, Ser: 0.72 mg/dL (ref 0.40–1.20)
GFR: 84.48 mL/min (ref >60.00)
Glucose, Bld: 165 mg/dL — ABNORMAL HIGH (ref 70–99)
Potassium: 4.1 mEq/L (ref 3.5–5.1)
Sodium: 138 mEq/L (ref 135–145)
Total Bilirubin: 0.4 mg/dL (ref 0.2–1.2)
Total Protein: 7.1 g/dL (ref 6.0–8.3)

## 2020-10-31 LAB — LIPID PANEL
Cholesterol: 156 mg/dL (ref 0–200)
HDL: 48.3 mg/dL (ref >39.00)
LDL Cholesterol: 75 mg/dL (ref 0–99)
NonHDL: 107.87
Total CHOL/HDL Ratio: 3
Triglycerides: 162 mg/dL — ABNORMAL HIGH (ref 0.0–149.0)
VLDL: 32.4 mg/dL (ref 0.0–40.0)

## 2020-10-31 LAB — CBC
HCT: 31.8 % — ABNORMAL LOW (ref 36.0–46.0)
Hemoglobin: 10.3 g/dL — ABNORMAL LOW (ref 12.0–15.0)
MCHC: 32.5 g/dL (ref 30.0–36.0)
MCV: 84.6 fl (ref 78.0–100.0)
Platelets: 229 10*3/uL (ref 150.0–400.0)
RBC: 3.76 Mil/uL — ABNORMAL LOW (ref 3.87–5.11)
RDW: 15.5 % (ref 11.5–15.5)
WBC: 5.8 10*3/uL (ref 4.0–10.5)

## 2020-10-31 LAB — VITAMIN D 25 HYDROXY (VIT D DEFICIENCY, FRACTURES): VITD: 19.23 ng/mL — ABNORMAL LOW (ref 30.00–100.00)

## 2020-10-31 LAB — HEMOGLOBIN A1C: Hgb A1c MFr Bld: 7.9 % — ABNORMAL HIGH (ref 4.6–6.5)

## 2020-10-31 LAB — FERRITIN: Ferritin: 6.5 ng/mL — ABNORMAL LOW (ref 10.0–291.0)

## 2020-10-31 LAB — TSH: TSH: 2.95 u[IU]/mL (ref 0.35–4.50)

## 2020-10-31 MED ORDER — AMLODIPINE BESYLATE 2.5 MG PO TABS: ORAL_TABLET | ORAL | 3 refills | Status: DC

## 2020-10-31 MED ORDER — ESCITALOPRAM OXALATE 20 MG PO TABS: 20.0000 mg | ORAL_TABLET | Freq: Every day | ORAL | 3 refills | Status: DC

## 2020-10-31 MED ORDER — IRBESARTAN 300 MG PO TABS: 300.0000 mg | ORAL_TABLET | Freq: Every day | ORAL | 3 refills | Status: DC

## 2020-10-31 MED ORDER — METFORMIN HCL 1000 MG PO TABS: ORAL_TABLET | ORAL | 3 refills | Status: DC

## 2020-10-31 MED ORDER — ALPRAZOLAM 0.5 MG PO TABS: 0.5000 mg | ORAL_TABLET | Freq: Two times a day (BID) | ORAL | 0 refills | Status: DC | PRN

## 2020-10-31 MED ORDER — FUROSEMIDE 20 MG PO TABS: 20.0000 mg | ORAL_TABLET | Freq: Two times a day (BID) | ORAL | 3 refills | Status: DC

## 2020-10-31 MED ORDER — ROSUVASTATIN CALCIUM 10 MG PO TABS: ORAL_TABLET | ORAL | 3 refills | Status: DC

## 2020-10-31 MED ORDER — LINAGLIPTIN 5 MG PO TABS: 5.0000 mg | ORAL_TABLET | Freq: Every day | ORAL | 3 refills | Status: DC

## 2020-10-31 NOTE — Progress Notes (Signed)
Meredith [name] is a 69 y.o. female with the following history as recorded in EpicCare:  Patient Active Problem List   Diagnosis Date Noted  . Aortic atherosclerosis ([place]) 06/05/2020  . Chest pain of uncertain etiology 36/11/2448  . Educated about COVID-19 virus infection 01/10/2020  . Bilateral primary osteoarthritis of knee 03/19/2019  . B12 deficiency anemia   . Other emphysema ([place]) 02/23/2014  . 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[place]) 12/31/2013  . Essential hypertension, benign 11/05/2013  . Routine health maintenance 09/27/2012  . Vitamin D deficiency 06/20/2011  . OBESITY, CLASS II 03/18/2011  . PULMONARY NODULE 07/12/2010  . Diabetes mellitus type 2, controlled, without complications ([place]) 75/30/0511  . Hyperlipidemia 10/01/2007  . ANXIETY 10/01/2007  . Depression 10/01/2007  . OSA (obstructive sleep apnea) 10/01/2007    Current Outpatient Medications  Medication Sig Dispense Refill  . amLODipine (NORVASC) 2.5 MG tablet TAKE 1 TABLET(2.5 MG) BY MOUTH DAILY 90 tablet 3  . aspirin 81 MG tablet Take 81 mg by mouth daily.      . Biotin 1000 MCG tablet     . escitalopram (LEXAPRO) 20 MG tablet Take 1 tablet (20 mg total) by mouth daily. 90 tablet 3  . folic acid (FOLVITE) 021 MCG tablet Take 400 mcg by mouth daily.    . furosemide (LASIX) 20 MG tablet Take 1 tablet (20 mg total) by mouth 2 (two) times daily. 180 tablet 3  . irbesartan (AVAPRO) 300 MG tablet Take 1 tablet (300 mg total) by mouth daily. 90 tablet 3  . linagliptin (TRADJENTA) 5 MG TABS tablet Take 1 tablet (5 mg total) by mouth daily. 90 tablet 3  . metFORMIN (GLUCOPHAGE) 1000 MG tablet TAKE 1 TABLET BY MOUTH TWICE DAILY WITH A MEAL 180 tablet 3  . rosuvastatin (CRESTOR) 10 MG tablet TAKE 1 TABLET BY MOUTH EVERY DAY 90 tablet 3  . Turmeric (QC TUMERIC COMPLEX) 500 MG CAPS     . ALPRAZolam (XANAX) 0.5 MG tablet Take 1 tablet (0.5 mg total) by mouth 2 (two) times daily as needed for anxiety. 60 tablet 0   No current  facility-administered medications for this visit.    Allergies: Patient has no known allergies.  Past Medical History:  Diagnosis Date  . Anemia   . Anxiety state, unspecified   . Arthritis    KNEES  . B12 deficiency anemia 05/2014 dx  . CIN I (cervical intraepithelial neoplasia I)   . Depressive disorder, not elsewhere classified   . Diabetes mellitus without complication ([place])   . Emphysema of lung ([place])   . Hypertension   . Internal hemorrhoids   . Other and unspecified hyperlipidemia   . Unspecified sleep apnea     Past Surgical History:  Procedure Laterality Date  . CERVICAL CERCLAGE    . CESAREAN SECTION    . COLONOSCOPY    . HEMORRHOID SURGERY    . KNEE ARTHROSCOPY Left 12/2014  . UPPER GASTROINTESTINAL ENDOSCOPY      Family History  Problem Relation Age of Onset  . Heart attack Father 29  . Diabetes Father   . Cancer Father        lung  . Heart disease Father   . Multiple sclerosis Mother   . Breast cancer Neg Hx     Social History   Tobacco Use  . Smoking status: Former Smoker    Packs/day: 1.00    Years: 30.00    Pack years: 30.00    Types: Cigarettes  . Smokeless  tobacco: Never Used  Substance Use Topics  . Alcohol use: Yes    Alcohol/week: 5.0 - 6.0 standard drinks    Types: 5 - 6 Standard drinks or equivalent per week    Subjective:  Patient presents for follow up on chronic care needs; needs to get labs updated;  Planning to see orthopedist next week to discuss chronic knee pain;  Scheduled for AWV tomorrow; seeing dentist regularly/ overdue to see eye doctor;   Objective:  Vitals:   10/31/20 0923  BP: 136/74  Pulse: 64  Temp: 98.5 F (36.9 C)  TempSrc: Oral  SpO2: 97%  Weight: 221 lb 6.4 oz (100.4 kg)  Height: 5' 5"  (1.651 m)    General: Well developed, well nourished, in no acute distress  Skin : Warm and dry.  Head: Normocephalic and atraumatic  Eyes: Sclera and conjunctiva clear; pupils round and reactive to light; extraocular  movements intact  Ears: External normal; canals clear; tympanic membranes normal  Oropharynx: Pink, supple. No suspicious lesions  Neck: Supple without thyromegaly, adenopathy  Lungs: Respirations unlabored; clear to auscultation bilaterally without wheeze, rales, rhonchi  CVS exam: normal rate and regular rhythm.  Musculoskeletal: No deformities; no active joint inflammation; chronic knee issues Extremities: No edema, cyanosis, clubbing  Vessels: Symmetric bilaterally  Neurologic: Alert and oriented; speech intact; face symmetrical; moves all extremities well; CNII-XII intact without focal deficit   Assessment:  1. Controlled type 2 diabetes mellitus without complication, without long-term current use of insulin (HCC)   2. Other fatigue   3. Vitamin D deficiency   4. Anxiety   5. Benign hypertension   6. Diabetes mellitus type 2 in obese (HCC) Chronic  7. Anemia, unspecified type     Plan:  Labs and refills updated; see ophthalmologist; keep planned appointment for AWV tomorrow; follow-up in 6 months, sooner prn.   This visit occurred during the SARS-CoV-2 public health emergency.  Safety protocols were in place, including screening questions prior to the visit, additional usage of staff PPE, and extensive cleaning of exam room while observing appropriate contact time as indicated for disinfecting solutions.     No follow-ups on file.  Orders Placed This Encounter  Procedures  . CBC    Standing Status:   Future    Number of Occurrences:   1    Standing Expiration Date:   10/31/2021  . Comp Met (CMET)    Standing Status:   Future    Number of Occurrences:   1    Standing Expiration Date:   10/31/2021  . Lipid panel    Standing Status:   Future    Number of Occurrences:   1    Standing Expiration Date:   10/31/2021  . TSH    Standing Status:   Future    Number of Occurrences:   1    Standing Expiration Date:   10/31/2021  . Vitamin D (25 hydroxy)    Standing Status:   Future     Number of Occurrences:   1    Standing Expiration Date:   10/31/2021  . Hemoglobin A1c    Standing Status:   Future    Number of Occurrences:   1    Standing Expiration Date:   10/31/2021  . Ferritin    Standing Status:   Future    Number of Occurrences:   1    Standing Expiration Date:   10/31/2021    Requested Prescriptions   Signed Prescriptions Disp Refills  .  ALPRAZolam (XANAX) 0.5 MG tablet 60 tablet 0    Sig: Take 1 tablet (0.5 mg total) by mouth 2 (two) times daily as needed for anxiety.  Marland Kitchen amLODipine (NORVASC) 2.5 MG tablet 90 tablet 3    Sig: TAKE 1 TABLET(2.5 MG) BY MOUTH DAILY  . escitalopram (LEXAPRO) 20 MG tablet 90 tablet 3    Sig: Take 1 tablet (20 mg total) by mouth daily.  . furosemide (LASIX) 20 MG tablet 180 tablet 3    Sig: Take 1 tablet (20 mg total) by mouth 2 (two) times daily.  . irbesartan (AVAPRO) 300 MG tablet 90 tablet 3    Sig: Take 1 tablet (300 mg total) by mouth daily.  . metFORMIN (GLUCOPHAGE) 1000 MG tablet 180 tablet 3    Sig: TAKE 1 TABLET BY MOUTH TWICE DAILY WITH A MEAL  . rosuvastatin (CRESTOR) 10 MG tablet 90 tablet 3    Sig: TAKE 1 TABLET BY MOUTH EVERY DAY  . linagliptin (TRADJENTA) 5 MG TABS tablet 90 tablet 3    Sig: Take 1 tablet (5 mg total) by mouth daily.

## 2020-11-01 ENCOUNTER — Ambulatory Visit: Payer: Medicare Other

## 2020-11-01 ENCOUNTER — Other Ambulatory Visit: Payer: Self-pay | Admitting: Family

## 2020-11-01 DIAGNOSIS — R790 Abnormal level of blood mineral: Secondary | ICD-10-CM

## 2020-11-01 MED ORDER — VITAMIN D (ERGOCALCIFEROL) 1.25 MG (50000 UNIT) PO CAPS: 50000.0000 [IU] | ORAL_CAPSULE | ORAL | 0 refills | Status: DC

## 2020-11-02 ENCOUNTER — Telehealth: Payer: Self-pay | Admitting: Hematology and Oncology

## 2020-11-02 NOTE — Telephone Encounter (Signed)
Received a new hem referral from Jodi Mourning, NP for low ferritin. Meredith [name] has been cld and scheduled to see Dr. Alvy Bimler on 11/23 at 12pm. Letter mailed.

## 2020-11-06 ENCOUNTER — Encounter: Payer: Self-pay | Admitting: Family

## 2020-11-16 DIAGNOSIS — M1712 Unilateral primary osteoarthritis, left knee: Secondary | ICD-10-CM | POA: Diagnosis not present

## 2020-11-21 ENCOUNTER — Telehealth: Payer: Self-pay | Admitting: Oncology

## 2020-11-21 ENCOUNTER — Encounter: Payer: Self-pay | Admitting: Hematology and Oncology

## 2020-11-21 ENCOUNTER — Encounter: Payer: Self-pay | Admitting: Family

## 2020-11-21 ENCOUNTER — Inpatient Hospital Stay: Payer: Medicare Other | Attending: Hematology and Oncology | Admitting: Hematology and Oncology

## 2020-11-21 ENCOUNTER — Other Ambulatory Visit: Payer: Self-pay

## 2020-11-21 ENCOUNTER — Inpatient Hospital Stay: Payer: Medicare Other

## 2020-11-21 VITALS — BP 119/55 | HR 77 | Temp 97.4°F | Resp 18 | Ht 65.0 in | Wt 222.8 lb

## 2020-11-21 DIAGNOSIS — D509 Iron deficiency anemia, unspecified: Secondary | ICD-10-CM | POA: Diagnosis not present

## 2020-11-21 DIAGNOSIS — M170 Bilateral primary osteoarthritis of knee: Secondary | ICD-10-CM | POA: Insufficient documentation

## 2020-11-21 DIAGNOSIS — D519 Vitamin B12 deficiency anemia, unspecified: Secondary | ICD-10-CM | POA: Insufficient documentation

## 2020-11-21 LAB — CBC WITH DIFFERENTIAL/PLATELET
Abs Immature Granulocytes: 0.02 10*3/uL (ref 0.00–0.07)
Basophils Absolute: 0.1 10*3/uL (ref 0.0–0.1)
Basophils Relative: 1 %
Eosinophils Absolute: 0.2 10*3/uL (ref 0.0–0.5)
Eosinophils Relative: 3 %
HCT: 34 % — ABNORMAL LOW (ref 36.0–46.0)
Hemoglobin: 10.7 g/dL — ABNORMAL LOW (ref 12.0–15.0)
Immature Granulocytes: 0 %
Lymphocytes Relative: 27 %
Lymphs Abs: 1.8 10*3/uL (ref 0.7–4.0)
MCH: 27.4 pg (ref 26.0–34.0)
MCHC: 31.5 g/dL (ref 30.0–36.0)
MCV: 87.2 fL (ref 80.0–100.0)
Monocytes Absolute: 0.4 10*3/uL (ref 0.1–1.0)
Monocytes Relative: 6 %
Neutro Abs: 4.1 10*3/uL (ref 1.7–7.7)
Neutrophils Relative %: 63 %
Platelets: 271 10*3/uL (ref 150–400)
RBC: 3.9 MIL/uL (ref 3.87–5.11)
RDW: 14.8 % (ref 11.5–15.5)
WBC: 6.6 10*3/uL (ref 4.0–10.5)
nRBC: 0 % (ref 0.0–0.2)

## 2020-11-21 LAB — VITAMIN B12: Vitamin B-12: 442 pg/mL (ref 180–914)

## 2020-11-21 LAB — IRON AND TIBC
Iron: 46 ug/dL (ref 41–142)
Saturation Ratios: 10 % — ABNORMAL LOW (ref 21–57)
TIBC: 484 ug/dL — ABNORMAL HIGH (ref 236–444)
UIBC: 438 ug/dL — ABNORMAL HIGH (ref 120–384)

## 2020-11-21 NOTE — Telephone Encounter (Signed)
Scheduled appts per 11/23 los. Gave pt a print out of AVS.  

## 2020-11-21 NOTE — Progress Notes (Signed)
[facility] NOTE  Patient Care Team: Marrian Salvage, [place] as PCP - General (Internal Medicine) Elveria Rising, MD (Obstetrics and Gynecology) Lafayette Dragon, MD (Inactive) (Gastroenterology) Minus Breeding, MD as Consulting Physician (Cardiology) Rigoberto Noel, MD as Consulting Physician (Pulmonary Disease)  CHIEF COMPLAINTS/PURPOSE OF CONSULTATION:  Severe iron deficiency anemia, history of pernicious anemia with chronic B12 replacement therapy  HISTORY OF PRESENTING ILLNESS:  Meredith [name] 69 y.o. female is here because of multiple mineral deficiencies  She was found to have abnormal CBC from recent blood work I have the opportunity to review her CBC dated back to June 2015 Hemoglobin ranged from 10.3-11.9 Her most recent blood work show evidence of severe iron deficiency She had EGD and colonoscopy performed in 2015 which showed no evidence of GI bleed She denies recent chest pain on exertion, but does complain of shortness of breath on minimal exertion and excessive fatigue. She had not noticed any recent bleeding such as epistaxis but did states she has occasional hemorrhoidal bleeding once a month She had taken NSAID in the past for chronic joint pain but nothing recently.  She is on chronic aspirin therapy  She had history of abnormal PAP smear but no prior history of cancer. Her age appropriate screening programs are up-to-date. She eats a variety of diet She used to donate blood a lot in the past but nothing in the last 20 years She has pica and craves ice The patient was prescribed oral iron supplements and she takes in the past but that cause significant constipation  MEDICAL HISTORY:  Past Medical History:  Diagnosis Date  . Anemia   . Anxiety state, unspecified   . Arthritis    KNEES  . B12 deficiency anemia 05/2014 dx  . CIN I (cervical intraepithelial neoplasia I)   . Depressive disorder, not elsewhere classified   . Diabetes  mellitus without complication ([place])   . Emphysema of lung ([place])   . Hypertension   . Internal hemorrhoids   . Other and unspecified hyperlipidemia   . Unspecified sleep apnea     SURGICAL HISTORY: Past Surgical History:  Procedure Laterality Date  . CERVICAL CERCLAGE    . CESAREAN SECTION    . COLONOSCOPY    . HEMORRHOID SURGERY    . KNEE ARTHROSCOPY Left 12/2014  . UPPER GASTROINTESTINAL ENDOSCOPY      SOCIAL HISTORY: Social History   Socioeconomic History  . Marital status: Divorced    Spouse name: Not on file  . Number of children: 3  . Years of education: 50  . Highest education level: Not on file  Occupational History  . 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 Smoking status: Former Smoker    Packs/day: 1.00    Years: 30.00    Pack years: 30.00    Types: Cigarettes  . Smokeless tobacco: Never Used  Vaping Use  . Vaping Use: Never used  Substance and Sexual Activity  . Alcohol use: Yes    Alcohol/week: 5.0 - 6.0 standard drinks    Types: 5 - 6 Standard drinks or equivalent per week  . Drug use: No  . Sexual activity: Never    Birth control/protection: Post-menopausal  Other Topics Concern  . 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 BA- psych from [employer]. Married - 14 years -divorced. 3 daughters - twins 105 -  one dtr,[place], died 2013 unknown causes, 51   Work: Biomedical engineer substance abuse Social worker. No h/o physical or sexual abuse   Social Determinants of Health   Financial Resource Strain:   . Difficulty of Paying Living Expenses: Not on file  Food Insecurity:   . Worried About Charity fundraiser in the Last Year: Not on file  . Ran Out of Food in the Last Year: Not on file  Transportation Needs:   . Lack of Transportation (Medical): Not on file  . Lack of Transportation (Non-Medical): Not on file  Physical Activity:   .  Days of Exercise per Week: Not on file  . Minutes of Exercise per Session: Not on file  Stress:   . Feeling of Stress : Not on file  Social Connections:   . Frequency of Communication with Friends and Family: Not on file  . Frequency of Social Gatherings with Friends and Family: Not on file  . Attends Religious Services: Not on file  . Active Member of Clubs or Organizations: Not on file  . Attends Archivist Meetings: Not on file  . Marital Status: Not on file  Intimate Partner Violence:   . Fear of Current or Ex-Partner: Not on file  . Emotionally Abused: Not on file  . Physically Abused: Not on file  . Sexually Abused: Not on file    FAMILY HISTORY: Family History  Problem Relation Age of Onset  . Heart attack Father 21  . Diabetes Father   . Cancer Father        lung  . Heart disease Father   . Multiple sclerosis Mother   . Breast cancer Neg Hx     ALLERGIES:  has No Known Allergies.  MEDICATIONS:  Current Outpatient Medications  Medication Sig Dispense Refill  . ALPRAZolam (XANAX) 0.5 MG tablet Take 1 tablet (0.5 mg total) by mouth 2 (two) times daily as needed for anxiety. 60 tablet 0  . amLODipine (NORVASC) 2.5 MG tablet TAKE 1 TABLET(2.5 MG) BY MOUTH DAILY 90 tablet 3  . aspirin 81 MG tablet Take 81 mg by mouth daily.      . Biotin 1000 MCG tablet     . escitalopram (LEXAPRO) 20 MG tablet Take 1 tablet (20 mg total) by mouth daily. 90 tablet 3  . folic acid (FOLVITE) 536 MCG tablet Take 400 mcg by mouth daily.    . furosemide (LASIX) 20 MG tablet Take 1 tablet (20 mg total) by mouth 2 (two) times daily. 180 tablet 3  . irbesartan (AVAPRO) 300 MG tablet Take 1 tablet (300 mg total) by mouth daily. 90 tablet 3  . linagliptin (TRADJENTA) 5 MG TABS tablet Take 1 tablet (5 mg total) by mouth daily. 90 tablet 3  . metFORMIN (GLUCOPHAGE) 1000 MG tablet TAKE 1 TABLET BY MOUTH TWICE DAILY WITH A MEAL 180 tablet 3  . rosuvastatin (CRESTOR) 10 MG tablet TAKE 1  TABLET BY MOUTH EVERY DAY 90 tablet 3  . Turmeric (QC TUMERIC COMPLEX) 500 MG CAPS     . Vitamin D, Ergocalciferol, (DRISDOL) 1.25 MG (50000 UNIT) CAPS capsule Take 1 capsule (50,000 Units total) by mouth every 7 (seven) days for 12 doses. 12 capsule 0   No current facility-administered medications for this visit.    REVIEW OF SYSTEMS:   Constitutional: Denies fevers, chills or abnormal night sweats Eyes: Denies blurriness of vision, double vision or watery eyes Ears, nose, mouth, throat, and face: Denies mucositis or sore throat  Cardiovascular: Denies palpitation, chest discomfort or lower extremity swelling Gastrointestinal:  Denies nausea, heartburn or change in bowel habits Skin: Denies abnormal skin rashes Lymphatics: Denies new lymphadenopathy or easy bruising Neurological:Denies numbness, tingling or new weaknesses Behavioral/Psych: Mood is stable, no new changes  All other systems were reviewed with the patient and are negative.  PHYSICAL EXAMINATION: ECOG PERFORMANCE STATUS: 1 - Symptomatic but completely ambulatory  Vitals:   11/21/20 1216  BP: (!) 119/55  Pulse: 77  Resp: 18  Temp: (!) 97.4 F (36.3 C)  SpO2: 98%   Filed Weights   11/21/20 1216  Weight: 222 lb 12.8 oz (101.1 kg)    GENERAL:alert, no distress and comfortable SKIN: skin color, texture, turgor are normal, no rashes or significant lesions EYES: normal, conjunctiva are pink and non-injected, sclera clear OROPHARYNX:no exudate, no erythema and lips, buccal mucosa, and tongue normal  NECK: supple, thyroid normal size, non-tender, without nodularity LYMPH:  no palpable lymphadenopathy in the cervical, axillary or inguinal LUNGS: clear to auscultation and percussion with normal breathing effort HEART: regular rate & rhythm and no murmurs and no lower extremity edema ABDOMEN:abdomen soft, non-tender and normal bowel sounds Musculoskeletal:no cyanosis of digits and no clubbing  PSYCH: alert & oriented x  3 with fluent speech NEURO: no focal motor/sensory deficits  ASSESSMENT & PLAN:  Iron deficiency anemia The most likely cause of her anemia is due to chronic blood loss/malabsorption syndrome. We discussed some of the risks, benefits, and alternatives of intravenous iron infusions. The patient is symptomatic from anemia and the iron level is critically low. She tolerated oral iron supplement poorly and desires to achieved higher levels of iron faster for adequate hematopoesis. Some of the side-effects to be expected including risks of infusion reactions, phlebitis, headaches, nausea and fatigue.  The patient is willing to proceed. Patient education material was dispensed.  Goal is to keep ferritin level greater than 50 and resolution of anemia I recommend the patient to keep her appointment to see GI service for evaluation and to rule out ongoing GI bleed such as esophagitis or stomach ulcer  B12 deficiency anemia She has history of pernicious anemia She has not received vitamin B12 injection for a long time I recommend recheck vitamin B12 level today and we will also give her B12 injection if indicated  Bilateral primary osteoarthritis of knee She has chronic knee pain and desire knee replacement surgery She was told by her surgeon that she would not get her operation if she remains anemic With her current level of hemoglobin, there is no contraindication for her to proceed with knee surgery  Orders Placed This Encounter  Procedures  . Vitamin B12    Standing Status:   Standing    Number of Occurrences:   2    Standing Expiration Date:   11/21/2021  . CBC with Differential/Platelet    Standing Status:   Standing    Number of Occurrences:   22    Standing Expiration Date:   11/21/2021  . Iron and TIBC    Standing Status:   Standing    Number of Occurrences:   3    Standing Expiration Date:   11/21/2021    All questions were answered. The patient knows to call the clinic with any  problems, questions or concerns.  The total time spent in the appointment was 50 minutes encounter with patients including review of chart and various tests results, discussions about plan of care and coordination of care plan  Heath Lark, MD 11/23/20211:58 PM       Heath Lark, MD 11/21/20 1:58 PM

## 2020-11-21 NOTE — Assessment & Plan Note (Signed)
She has chronic knee pain and desire knee replacement surgery She was told by her surgeon that she would not get her operation if she remains anemic With her current level of hemoglobin, there is no contraindication for her to proceed with knee surgery

## 2020-11-21 NOTE — Assessment & Plan Note (Signed)
She has history of pernicious anemia She has not received vitamin B12 injection for a long time I recommend recheck vitamin B12 level today and we will also give her B12 injection if indicated

## 2020-11-21 NOTE — Assessment & Plan Note (Signed)
The most likely cause of her anemia is due to chronic blood loss/malabsorption syndrome. We discussed some of the risks, benefits, and alternatives of intravenous iron infusions. The patient is symptomatic from anemia and the iron level is critically low. She tolerated oral iron supplement poorly and desires to achieved higher levels of iron faster for adequate hematopoesis. Some of the side-effects to be expected including risks of infusion reactions, phlebitis, headaches, nausea and fatigue.  The patient is willing to proceed. Patient education material was dispensed.  Goal is to keep ferritin level greater than 50 and resolution of anemia I recommend the patient to keep her appointment to see GI service for evaluation and to rule out ongoing GI bleed such as esophagitis or stomach ulcer

## 2020-11-22 ENCOUNTER — Telehealth: Payer: Self-pay

## 2020-11-22 NOTE — Telephone Encounter (Signed)
-----   Message from Heath Lark, MD sent at 11/22/2020  1:57 PM EST ----- Regarding: B12 level is ok Pls call her and let her know B12 level is ok Iron is low as expected

## 2020-11-22 NOTE — Telephone Encounter (Signed)
Called and left below message. Ask her to call the office for questions. ?

## 2020-12-04 ENCOUNTER — Encounter: Payer: Self-pay | Admitting: Gastroenterology

## 2020-12-04 ENCOUNTER — Ambulatory Visit (INDEPENDENT_AMBULATORY_CARE_PROVIDER_SITE_OTHER): Payer: Medicare Other | Admitting: Gastroenterology

## 2020-12-04 VITALS — BP 122/64 | HR 70 | Ht 65.0 in | Wt 220.2 lb

## 2020-12-04 DIAGNOSIS — D509 Iron deficiency anemia, unspecified: Secondary | ICD-10-CM | POA: Diagnosis not present

## 2020-12-04 MED ORDER — SUPREP BOWEL PREP KIT 17.5-3.13-1.6 GM/177ML PO SOLN: 1.0000 | Freq: Once | ORAL | 0 refills | Status: AC

## 2020-12-04 NOTE — Patient Instructions (Signed)
You have been scheduled for an endoscopy and colonoscopy. Please follow the written instructions given to you at your visit today. Please pick up your prep supplies at the pharmacy within the next 1-3 days. If you use inhalers (even only as needed), please bring them with you on the day of your procedure.   If you are age 70 or older, your body mass index should be between 23-30. Your Body mass index is 36.65 kg/m. If this is out of the aforementioned range listed, please consider follow up with your Primary Care Provider.  If you are age 104 or younger, your body mass index should be between 19-25. Your Body mass index is 36.65 kg/m. If this is out of the aformentioned range listed, please consider follow up with your Primary Care Provider.    Due to recent changes in healthcare laws, you may see the results of your imaging and laboratory studies on MyChart before your provider has had a chance to review them.  We understand that in some cases there may be results that are confusing or concerning to you. Not all laboratory results come back in the same time frame and the provider may be waiting for multiple results in order to interpret others.  Please give [place] 48 hours in order for your provider to thoroughly review all the results before contacting the office for clarification of your results.   I appreciate the  opportunity to care for you  Thank You   Harl Bowie , MD

## 2020-12-04 NOTE — Progress Notes (Signed)
Meredith [name]    [id number]    [birth year]  Primary Care Physician:Murray, Marvis Repress, [place]  Referring Physician: Marrian Salvage, [place],  [place] [address]   Chief complaint: Anemia  HPI: 69 year old very pleasant female here for new patient visit for evaluation of worsening chronic anemia, iron and B12 deficiency  Denies any overt GI bleeding, no melena or hematochezia.  No abdominal pain, vomiting, dysphagia or unintentional weight loss  Occasional small-volume bright red blood per rectum likely from hemorrhoids per patient, usually after she has a bowel movement  She has extreme fatigue and has also noticed that she is getting more irritable  EGD and colonoscopy by Dr. Olevia Perches in 2015: No source for potential GI blood loss other than gastric erosions in the antrum.  10 mm hyperplastic polyp removed from sigmoid colon  Gastric biopsies showed mild chronic gastritis.  Duodenal biopsies were normal.  She takes aspirin 81 mg daily and also takes NSAIDs intermittently for arthritis  CBC    Component Value Date/Time   WBC 6.6 11/21/2020 1306   RBC 3.90 11/21/2020 1306   HGB 10.7 (L) 11/21/2020 1306   HCT 34.0 (L) 11/21/2020 1306   PLT 271 11/21/2020 1306   MCV 87.2 11/21/2020 1306   MCV 83.5 03/15/2016 1942   MCH 27.4 11/21/2020 1306   MCHC 31.5 11/21/2020 1306   RDW 14.8 11/21/2020 1306   LYMPHSABS 1.8 11/21/2020 1306   MONOABS 0.4 11/21/2020 1306   EOSABS 0.2 11/21/2020 1306   BASOSABS 0.1 11/21/2020 1306   Iron/TIBC/Ferritin/ %Sat    Component Value Date/Time   IRON 46 11/21/2020 1306   TIBC 484 (H) 11/21/2020 1306   FERRITIN 6.5 (L) 10/31/2020 1019   IRONPCTSAT 10 (L) 11/21/2020 1306   IRONPCTSAT 10 (L) 06/15/2014 1629    Outpatient Encounter Medications as of 12/04/2020  Medication Sig  . ALPRAZolam (XANAX) 0.5 MG tablet Take 1 tablet (0.5 mg total) by mouth 2 (two) times daily as needed for anxiety.  Marland Kitchen amLODipine  (NORVASC) 2.5 MG tablet TAKE 1 TABLET(2.5 MG) BY MOUTH DAILY  . aspirin 81 MG tablet Take 81 mg by mouth daily.    . Biotin 1000 MCG tablet   . escitalopram (LEXAPRO) 20 MG tablet Take 1 tablet (20 mg total) by mouth daily.  . folic acid (FOLVITE) 253 MCG tablet Take 400 mcg by mouth daily.  . furosemide (LASIX) 20 MG tablet Take 1 tablet (20 mg total) by mouth 2 (two) times daily.  . irbesartan (AVAPRO) 300 MG tablet Take 1 tablet (300 mg total) by mouth daily.  Marland Kitchen linagliptin (TRADJENTA) 5 MG TABS tablet Take 1 tablet (5 mg total) by mouth daily.  . metFORMIN (GLUCOPHAGE) 1000 MG tablet TAKE 1 TABLET BY MOUTH TWICE DAILY WITH A MEAL  . rosuvastatin (CRESTOR) 10 MG tablet TAKE 1 TABLET BY MOUTH EVERY DAY  . Turmeric (QC TUMERIC COMPLEX) 500 MG CAPS   . Vitamin D, Ergocalciferol, (DRISDOL) 1.25 MG (50000 UNIT) CAPS capsule Take 1 capsule (50,000 Units total) by mouth every 7 (seven) days for 12 doses.   No facility-administered encounter medications on file as of 12/04/2020.    Allergies as of 12/04/2020  . (No Known Allergies)    Past Medical History:  Diagnosis Date  . Anemia   . Anxiety state, unspecified   . Arthritis    KNEES  . B12 deficiency anemia 05/2014 dx  . CIN I (  cervical intraepithelial neoplasia I)   . Depressive disorder, not elsewhere classified   . Diabetes mellitus without complication ([facility])   . Emphysema of lung ([facility])   . Hypertension   . Internal hemorrhoids   . Other and unspecified hyperlipidemia   . Unspecified sleep apnea     Past Surgical History:  Procedure Laterality Date  . CERVICAL CERCLAGE    . CESAREAN SECTION    . COLONOSCOPY    . HEMORRHOID SURGERY    . KNEE ARTHROSCOPY Left 12/2014  . UPPER GASTROINTESTINAL ENDOSCOPY      Family History  Problem Relation Age of Onset  . Heart attack Father 51  . Diabetes Father   . Cancer Father        lung  . Heart disease Father   . Multiple sclerosis Mother   . Breast cancer Neg Hx     Social  History   Socioeconomic History  . Marital status: Divorced    Spouse name: Not on file  . Number of children: 3  . Years of education: 30  . Highest education level: Not on file  Occupational History  . 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 Smoking status: Former Smoker    Packs/day: 1.00    Years: 30.00    Pack years: 30.00    Types: Cigarettes  . Smokeless tobacco: Never Used  Vaping Use  . Vaping Use: Never used  Substance and Sexual Activity  . Alcohol use: Yes    Alcohol/week: 5.0 - 6.0 standard drinks    Types: 5 - 6 Standard drinks or equivalent per week  . Drug use: No  . Sexual activity: Never    Birth control/protection: Post-menopausal  Other Topics Concern  . 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 BA- psych from [employer]. Married - 14 years -divorced. 3 daughters - twins 65 - one dtr,[place], died 2012/04/05 unknown causes, 85   Work: Biomedical engineer substance abuse Social worker. No h/o physical or sexual abuse   Social Determinants of Health   Financial Resource Strain:   . Difficulty of Paying Living Expenses: Not on file  Food Insecurity:   . Worried About Charity fundraiser in the Last Year: Not on file  . Ran Out of Food in the Last Year: Not on file  Transportation Needs:   . Lack of Transportation (Medical): Not on file  . Lack of Transportation (Non-Medical): Not on file  Physical Activity:   . Days of Exercise per Week: Not on file  . Minutes of Exercise per Session: Not on file  Stress:   . Feeling of Stress : Not on file  Social Connections:   . Frequency of Communication with Friends and Family: Not on file  . Frequency of Social Gatherings with Friends and Family: Not on file  . Attends Religious Services: Not on file  . Active Member of Clubs or Organizations: Not on file  . Attends Archivist Meetings: Not on  file  . Marital Status: Not on file  Intimate Partner Violence:   . Fear of Current or Ex-Partner: Not on file  . Emotionally Abused: Not on file  . Physically Abused: Not on file  . Sexually Abused: Not on file      Review of systems: All other review of systems negative except as mentioned in the HPI.   Physical Exam:  Vitals:   12/04/20 1400  BP: 122/64  Pulse: 70  SpO2: 98%   Body mass index is 36.65 kg/m. Gen:      No acute distress HEENT:  sclera anicteric Abd:      soft, non-tender; no palpable masses, no distension Ext:    No edema Neuro: alert and oriented x 3 Psych: normal mood and affect  Data Reviewed:  Reviewed labs, radiology imaging, old records and pertinent past GI work up   Assessment and Plan/Recommendations:  69 year old very pleasant female with chronic iron deficiency and B12 deficiency anemia  Last EGD with findings of erosive gastritis and colonoscopy> 5 years ago, with no neoplastic lesions  Given persistent significant iron deficiency, will plan to proceed with EGD and colonoscopy for further evaluation, exclude any neoplastic lesion, AVMs or erosions If EGD and colonoscopy unrevealing for any potential source of GI blood loss, will consider small bowel video capsule for further evaluation  The risks and benefits as well as alternatives of endoscopic procedure(s) have been discussed and reviewed. All questions answered. The patient agrees to proceed.   The patient was provided an opportunity to ask questions and all were answered. The patient agreed with the plan and demonstrated an understanding of the instructions.  Damaris Hippo , MD    CC: Marrian Salvage,*

## 2020-12-05 ENCOUNTER — Inpatient Hospital Stay: Payer: Medicare Other | Attending: Hematology and Oncology

## 2020-12-05 ENCOUNTER — Other Ambulatory Visit: Payer: Self-pay | Admitting: Hematology and Oncology

## 2020-12-05 ENCOUNTER — Other Ambulatory Visit: Payer: Self-pay

## 2020-12-05 ENCOUNTER — Encounter: Payer: Self-pay | Admitting: Gastroenterology

## 2020-12-05 VITALS — BP 140/74 | HR 68 | Temp 98.6°F | Resp 18 | Ht 65.0 in | Wt 222.8 lb

## 2020-12-05 DIAGNOSIS — D509 Iron deficiency anemia, unspecified: Secondary | ICD-10-CM | POA: Insufficient documentation

## 2020-12-05 DIAGNOSIS — D519 Vitamin B12 deficiency anemia, unspecified: Secondary | ICD-10-CM

## 2020-12-05 MED ORDER — FAMOTIDINE IN NACL 20-0.9 MG/50ML-% IV SOLN
20.0000 mg | Freq: Once | INTRAVENOUS | Status: AC
  Administered 2020-12-05: 20 mg via INTRAVENOUS

## 2020-12-05 MED ORDER — LORATADINE 10 MG PO TABS
ORAL_TABLET | ORAL | Status: AC
  Filled 2020-12-05: qty 1

## 2020-12-05 MED ORDER — DIPHENHYDRAMINE HCL 50 MG/ML IJ SOLN
INTRAMUSCULAR | Status: AC
  Filled 2020-12-05: qty 1

## 2020-12-05 MED ORDER — SODIUM CHLORIDE 0.9 % IV SOLN
Freq: Once | INTRAVENOUS | Status: AC
  Filled 2020-12-05: qty 250

## 2020-12-05 MED ORDER — LORATADINE 10 MG PO TABS
10.0000 mg | ORAL_TABLET | Freq: Once | ORAL | Status: AC
  Administered 2020-12-05: 10 mg via ORAL

## 2020-12-05 MED ORDER — DIPHENHYDRAMINE HCL 50 MG/ML IJ SOLN
25.0000 mg | Freq: Once | INTRAMUSCULAR | Status: AC
  Administered 2020-12-05: 25 mg via INTRAVENOUS

## 2020-12-05 MED ORDER — SODIUM CHLORIDE 0.9 % IV SOLN
300.0000 mg | Freq: Once | INTRAVENOUS | Status: AC
  Administered 2020-12-05: 300 mg via INTRAVENOUS
  Filled 2020-12-05: qty 10

## 2020-12-05 MED ORDER — FAMOTIDINE IN NACL 20-0.9 MG/50ML-% IV SOLN
INTRAVENOUS | Status: AC
  Filled 2020-12-05: qty 50

## 2020-12-12 ENCOUNTER — Inpatient Hospital Stay: Payer: Medicare Other

## 2020-12-19 ENCOUNTER — Ambulatory Visit: Payer: Medicare Other

## 2020-12-19 ENCOUNTER — Inpatient Hospital Stay: Payer: Medicare Other

## 2020-12-26 ENCOUNTER — Inpatient Hospital Stay: Payer: Medicare Other

## 2020-12-26 ENCOUNTER — Ambulatory Visit: Payer: Medicare Other

## 2020-12-26 ENCOUNTER — Other Ambulatory Visit: Payer: Self-pay

## 2020-12-26 VITALS — BP 145/59 | HR 78 | Temp 98.4°F | Resp 18 | Ht 65.0 in | Wt 221.8 lb

## 2020-12-26 DIAGNOSIS — D519 Vitamin B12 deficiency anemia, unspecified: Secondary | ICD-10-CM

## 2020-12-26 DIAGNOSIS — D509 Iron deficiency anemia, unspecified: Secondary | ICD-10-CM | POA: Diagnosis not present

## 2020-12-26 MED ORDER — FAMOTIDINE IN NACL 20-0.9 MG/50ML-% IV SOLN
20.0000 mg | Freq: Once | INTRAVENOUS | Status: AC
  Administered 2020-12-26: 14:00:00 20 mg via INTRAVENOUS

## 2020-12-26 MED ORDER — SODIUM CHLORIDE 0.9 % IV SOLN
300.0000 mg | Freq: Once | INTRAVENOUS | Status: AC
  Administered 2020-12-26: 15:00:00 300 mg via INTRAVENOUS
  Filled 2020-12-26: qty 10

## 2020-12-26 MED ORDER — SODIUM CHLORIDE 0.9 % IV SOLN
Freq: Once | INTRAVENOUS | Status: AC
  Filled 2020-12-26: qty 250

## 2020-12-26 MED ORDER — LORATADINE 10 MG PO TABS
10.0000 mg | ORAL_TABLET | Freq: Once | ORAL | Status: AC
  Administered 2020-12-26: 14:00:00 10 mg via ORAL

## 2020-12-26 MED ORDER — DIPHENHYDRAMINE HCL 50 MG/ML IJ SOLN
25.0000 mg | Freq: Once | INTRAMUSCULAR | Status: AC
  Administered 2020-12-26: 14:00:00 25 mg via INTRAVENOUS

## 2020-12-26 NOTE — Patient Instructions (Signed)

## 2021-01-04 ENCOUNTER — Inpatient Hospital Stay: Payer: Medicare Other

## 2021-01-05 ENCOUNTER — Other Ambulatory Visit: Payer: Self-pay

## 2021-01-05 ENCOUNTER — Inpatient Hospital Stay: Payer: Medicare Other | Attending: Hematology and Oncology

## 2021-01-05 VITALS — BP 116/62 | HR 70 | Temp 97.9°F | Resp 18

## 2021-01-05 DIAGNOSIS — D509 Iron deficiency anemia, unspecified: Secondary | ICD-10-CM | POA: Diagnosis not present

## 2021-01-05 DIAGNOSIS — D519 Vitamin B12 deficiency anemia, unspecified: Secondary | ICD-10-CM

## 2021-01-05 MED ORDER — DIPHENHYDRAMINE HCL 50 MG/ML IJ SOLN
INTRAMUSCULAR | Status: AC
  Filled 2021-01-05: qty 1

## 2021-01-05 MED ORDER — FAMOTIDINE IN NACL 20-0.9 MG/50ML-% IV SOLN
20.0000 mg | Freq: Once | INTRAVENOUS | Status: AC
  Administered 2021-01-05: 20 mg via INTRAVENOUS

## 2021-01-05 MED ORDER — LORATADINE 10 MG PO TABS
10.0000 mg | ORAL_TABLET | Freq: Once | ORAL | Status: AC
  Administered 2021-01-05: 10 mg via ORAL

## 2021-01-05 MED ORDER — DIPHENHYDRAMINE HCL 50 MG/ML IJ SOLN
25.0000 mg | Freq: Once | INTRAMUSCULAR | Status: AC
  Administered 2021-01-05: 25 mg via INTRAVENOUS

## 2021-01-05 MED ORDER — FAMOTIDINE IN NACL 20-0.9 MG/50ML-% IV SOLN
INTRAVENOUS | Status: AC
  Filled 2021-01-05: qty 50

## 2021-01-05 MED ORDER — SODIUM CHLORIDE 0.9 % IV SOLN
Freq: Once | INTRAVENOUS | Status: AC
  Filled 2021-01-05: qty 250

## 2021-01-05 MED ORDER — LORATADINE 10 MG PO TABS
ORAL_TABLET | ORAL | Status: AC
  Filled 2021-01-05: qty 1

## 2021-01-05 MED ORDER — SODIUM CHLORIDE 0.9 % IV SOLN
300.0000 mg | Freq: Once | INTRAVENOUS | Status: AC
  Administered 2021-01-05: 300 mg via INTRAVENOUS
  Filled 2021-01-05: qty 5

## 2021-01-05 NOTE — Patient Instructions (Signed)
Iron Dextran injection What is this medicine? IRON DEXTRAN (AHY ern DEX tran) is an iron complex. Iron is used to make healthy red blood cells, which carry oxygen and nutrients through the body. This medicine is used to treat people who cannot take iron by mouth and have low levels of iron in the blood. This medicine may be used for other purposes; ask your health care provider or pharmacist if you have questions. COMMON BRAND NAME(S): Dexferrum, INFeD What should I tell my health care provider before I take this medicine? They need to know if you have any of these conditions:  anemia not caused by low iron levels  heart disease  high levels of iron in the blood  kidney disease  liver disease  an unusual or allergic reaction to iron, other medicines, foods, dyes, or preservatives  pregnant or trying to get pregnant  breast-feeding How should I use this medicine? This medicine is for injection into a vein or a muscle. It is given by a health care professional in a hospital or clinic setting. Talk to your pediatrician regarding the use of this medicine in children. While this drug may be prescribed for children as young as 4 months old for selected conditions, precautions do apply. Overdosage: If you think you have taken too much of this medicine contact a poison control center or emergency room at once. NOTE: This medicine is only for you. Do not share this medicine with others. What if I miss a dose? It is important not to miss your dose. Call your doctor or health care professional if you are unable to keep an appointment. What may interact with this medicine? Do not take this medicine with any of the following medications:  deferoxamine  dimercaprol  other iron products This medicine may also interact with the following medications:  chloramphenicol  deferasirox This list may not describe all possible interactions. Give your health care provider a list of all the  medicines, herbs, non-prescription drugs, or dietary supplements you use. Also tell them if you smoke, drink alcohol, or use illegal drugs. Some items may interact with your medicine. What should I watch for while using this medicine? Visit your doctor or health care professional regularly. Tell your doctor if your symptoms do not start to get better or if they get worse. You may need blood work done while you are taking this medicine. You may need to follow a special diet. Talk to your doctor. Foods that contain iron include: whole grains/cereals, dried fruits, beans, or peas, leafy green vegetables, and organ meats (liver, kidney). Long-term use of this medicine may increase your risk of some cancers. Talk to your doctor about how to limit your risk. What side effects may I notice from receiving this medicine? Side effects that you should report to your doctor or health care professional as soon as possible:  allergic reactions like skin rash, itching or hives, swelling of the face, lips, or tongue  blue lips, nails, or skin  breathing problems  changes in blood pressure  chest pain  confusion  fast, irregular heartbeat  feeling faint or lightheaded, falls  fever or chills  flushing, sweating, or hot feelings  joint or muscle aches or pains  pain, tingling, numbness in the hands or feet  seizures  unusually weak or tired Side effects that usually do not require medical attention (report to your doctor or health care professional if they continue or are bothersome):  change in taste (metallic taste)    diarrhea  headache  irritation at site where injected  nausea, vomiting  stomach upset This list may not describe all possible side effects. Call your doctor for medical advice about side effects. You may report side effects to FDA at 1-800-FDA-1088. Where should I keep my medicine? This drug is given in a hospital or clinic and will not be stored at home. NOTE: This  sheet is a summary. It may not cover all possible information. If you have questions about this medicine, talk to your doctor, pharmacist, or health care provider.  2020 Elsevier/Gold Standard (2008-05-03 16:59:50)  

## 2021-01-05 NOTE — Progress Notes (Signed)
Patient declined post observation. Verbal education provided. 

## 2021-01-11 ENCOUNTER — Inpatient Hospital Stay: Payer: Medicare Other

## 2021-01-11 ENCOUNTER — Other Ambulatory Visit: Payer: Self-pay

## 2021-01-11 VITALS — BP 135/69 | HR 65 | Temp 97.8°F | Resp 16

## 2021-01-11 DIAGNOSIS — D509 Iron deficiency anemia, unspecified: Secondary | ICD-10-CM

## 2021-01-11 DIAGNOSIS — D519 Vitamin B12 deficiency anemia, unspecified: Secondary | ICD-10-CM

## 2021-01-11 MED ORDER — DIPHENHYDRAMINE HCL 50 MG/ML IJ SOLN
INTRAMUSCULAR | Status: AC
  Filled 2021-01-11: qty 1

## 2021-01-11 MED ORDER — SODIUM CHLORIDE 0.9 % IV SOLN
Freq: Once | INTRAVENOUS | Status: AC
  Filled 2021-01-11: qty 250

## 2021-01-11 MED ORDER — LORATADINE 10 MG PO TABS
10.0000 mg | ORAL_TABLET | Freq: Once | ORAL | Status: AC
  Administered 2021-01-11: 10 mg via ORAL

## 2021-01-11 MED ORDER — SODIUM CHLORIDE 0.9 % IV SOLN
300.0000 mg | Freq: Once | INTRAVENOUS | Status: AC
  Administered 2021-01-11: 300 mg via INTRAVENOUS
  Filled 2021-01-11: qty 15

## 2021-01-11 MED ORDER — LORATADINE 10 MG PO TABS
ORAL_TABLET | ORAL | Status: AC
  Filled 2021-01-11: qty 1

## 2021-01-11 MED ORDER — DIPHENHYDRAMINE HCL 50 MG/ML IJ SOLN
25.0000 mg | Freq: Once | INTRAMUSCULAR | Status: AC
  Administered 2021-01-11: 25 mg via INTRAVENOUS

## 2021-01-11 MED ORDER — FAMOTIDINE IN NACL 20-0.9 MG/50ML-% IV SOLN
20.0000 mg | Freq: Once | INTRAVENOUS | Status: AC
  Administered 2021-01-11: 20 mg via INTRAVENOUS

## 2021-01-11 MED ORDER — FAMOTIDINE IN NACL 20-0.9 MG/50ML-% IV SOLN
INTRAVENOUS | Status: AC
  Filled 2021-01-11: qty 50

## 2021-01-11 NOTE — Patient Instructions (Addendum)

## 2021-01-17 ENCOUNTER — Other Ambulatory Visit: Payer: Self-pay | Admitting: Family

## 2021-01-23 ENCOUNTER — Ambulatory Visit (AMBULATORY_SURGERY_CENTER): Payer: Medicare Other | Admitting: Gastroenterology

## 2021-01-23 ENCOUNTER — Encounter: Payer: Self-pay | Admitting: Gastroenterology

## 2021-01-23 ENCOUNTER — Other Ambulatory Visit: Payer: Self-pay

## 2021-01-23 VITALS — BP 145/72 | HR 66 | Temp 97.3°F | Resp 16 | Ht 65.0 in | Wt 220.0 lb

## 2021-01-23 DIAGNOSIS — K2950 Unspecified chronic gastritis without bleeding: Secondary | ICD-10-CM | POA: Diagnosis not present

## 2021-01-23 DIAGNOSIS — K259 Gastric ulcer, unspecified as acute or chronic, without hemorrhage or perforation: Secondary | ICD-10-CM

## 2021-01-23 DIAGNOSIS — K648 Other hemorrhoids: Secondary | ICD-10-CM

## 2021-01-23 DIAGNOSIS — K621 Rectal polyp: Secondary | ICD-10-CM | POA: Diagnosis not present

## 2021-01-23 DIAGNOSIS — D508 Other iron deficiency anemias: Secondary | ICD-10-CM

## 2021-01-23 DIAGNOSIS — D509 Iron deficiency anemia, unspecified: Secondary | ICD-10-CM | POA: Diagnosis not present

## 2021-01-23 DIAGNOSIS — K5730 Diverticulosis of large intestine without perforation or abscess without bleeding: Secondary | ICD-10-CM | POA: Diagnosis not present

## 2021-01-23 DIAGNOSIS — K2990 Gastroduodenitis, unspecified, without bleeding: Secondary | ICD-10-CM

## 2021-01-23 DIAGNOSIS — K2970 Gastritis, unspecified, without bleeding: Secondary | ICD-10-CM

## 2021-01-23 DIAGNOSIS — D128 Benign neoplasm of rectum: Secondary | ICD-10-CM

## 2021-01-23 DIAGNOSIS — D500 Iron deficiency anemia secondary to blood loss (chronic): Secondary | ICD-10-CM

## 2021-01-23 MED ORDER — SODIUM CHLORIDE 0.9 % IV SOLN: 500.0000 mL | Freq: Once | INTRAVENOUS | Status: DC

## 2021-01-23 MED ORDER — PANTOPRAZOLE SODIUM 40 MG PO TBEC: 40.0000 mg | DELAYED_RELEASE_TABLET | Freq: Every day | ORAL | 2 refills | Status: DC

## 2021-01-23 NOTE — Progress Notes (Signed)
Pt's states no medical or surgical changes since previsit or office visit. 

## 2021-01-23 NOTE — Progress Notes (Signed)
S. B. LPN vital signs.

## 2021-01-23 NOTE — Op Note (Signed)
[facility] Patient Name: Meredith [name] Procedure Date: 01/23/2021 3:34 PM MRN: [id number] Endoscopist: Mauri Pole , MD Age: 70 Referring MD:  Date of Birth: [birth year] Gender: Female Account #: [id number] Procedure:                Colonoscopy Indications:              Unexplained iron deficiency anemia Medicines:                Monitored Anesthesia Care Procedure:                Pre-Anesthesia Assessment:                           - Prior to the procedure, a History and Physical                            was performed, and patient medications and                            allergies were reviewed. The patient's tolerance of                            previous anesthesia was also reviewed. The risks                            and benefits of the procedure and the sedation                            options and risks were discussed with the patient.                            All questions were answered, and informed consent                            was obtained. Prior Anticoagulants: The patient has                            taken no previous anticoagulant or antiplatelet                            agents. ASA Grade Assessment: III - A patient with                            severe systemic disease. After reviewing the risks                            and benefits, the patient was deemed in                            satisfactory condition to undergo the procedure.                           After obtaining informed consent, the colonoscope  was passed under direct vision. Throughout the                            procedure, the patient's blood pressure, pulse, and                            oxygen saturations were monitored continuously. The                            Olympus PCF-H190DL [id number]) Colonoscope was                            introduced through the anus and advanced to the the                            cecum, identified by  appendiceal orifice and                            ileocecal valve. The colonoscopy was performed                            without difficulty. The patient tolerated the                            procedure well. The quality of the bowel                            preparation was excellent. The ileocecal valve,                            appendiceal orifice, and rectum were photographed. Scope In: 3:45:05 PM Scope Out: 3:59:03 PM Scope Withdrawal Time: 0 hours 9 minutes 8 seconds  Total Procedure Duration: 0 hours 13 minutes 58 seconds  Findings:                 The perianal and digital rectal examinations were                            normal.                           Multiple small and large-mouthed diverticula were                            found in the sigmoid colon, descending colon and                            transverse colon.                           A 3 mm polyp was found in the rectum. The polyp was                            sessile. The polyp was removed with a cold snare.  Resection and retrieval were complete.                           Non-bleeding internal hemorrhoids were found during                            retroflexion. The hemorrhoids were small. Complications:            No immediate complications. Estimated Blood Loss:     Estimated blood loss was minimal. Impression:               - Diverticulosis in the sigmoid colon, in the                            descending colon and in the transverse colon.                           - One 3 mm polyp in the rectum, removed with a cold                            snare. Resected and retrieved.                           - Non-bleeding internal hemorrhoids. Recommendation:           - Patient has a contact number available for                            emergencies. The signs and symptoms of potential                            delayed complications were discussed with the                             patient. Return to normal activities tomorrow.                            Written discharge instructions were provided to the                            patient.                           - Resume previous diet.                           - Continue present medications.                           - Await pathology results.                           - Repeat colonoscopy in 5-10 years for surveillance                            based on pathology results. Mauri Pole, MD 01/23/2021 4:08:49 PM This report has been  signed electronically.

## 2021-01-23 NOTE — Patient Instructions (Signed)
Handouts Provided:  Gastritis and Polyps  START taking Protonix 40 mg by mouth once daily for 3 months  No aspirin, ibuprofen, naproxen, or other non-steroidal anti-inflammatory drugs  YOU HAD AN ENDOSCOPIC PROCEDURE TODAY AT [facility]:   Refer to the procedure report that was given to you for any specific questions about what was found during the examination.  If the procedure report does not answer your questions, please call your gastroenterologist to clarify.  If you requested that your care partner not be given the details of your procedure findings, then the procedure report has been included in a sealed envelope for you to review at your convenience later.  YOU SHOULD EXPECT: Some feelings of bloating in the abdomen. Passage of more gas than usual.  Walking can help get rid of the air that was put into your GI tract during the procedure and reduce the bloating. If you had a lower endoscopy (such as a colonoscopy or flexible sigmoidoscopy) you may notice spotting of blood in your stool or on the toilet paper. If you underwent a bowel prep for your procedure, you may not have a normal bowel movement for a few days.  Please Note:  You might notice some irritation and congestion in your nose or some drainage.  This is from the oxygen used during your procedure.  There is no need for concern and it should clear up in a day or so.  SYMPTOMS TO REPORT IMMEDIATELY:   Following lower endoscopy (colonoscopy or flexible sigmoidoscopy):  Excessive amounts of blood in the stool  Significant tenderness or worsening of abdominal pains  Swelling of the abdomen that is new, acute  Fever of 100F or higher   Following upper endoscopy (EGD)  Vomiting of blood or coffee ground material  New chest pain or pain under the shoulder blades  Painful or persistently difficult swallowing  New shortness of breath  Fever of 100F or higher  Black, tarry-looking stools  For urgent or  emergent issues, a gastroenterologist can be reached at any hour by calling [phone]. Do not use MyChart messaging for urgent concerns.    DIET:  We do recommend a small meal at first, but then you may proceed to your regular diet.  Drink plenty of fluids but you should avoid alcoholic beverages for 24 hours.  ACTIVITY:  You should plan to take it easy for the rest of today and you should NOT DRIVE or use heavy machinery until tomorrow (because of the sedation medicines used during the test).    FOLLOW UP: Our staff will call the number listed on your records 48-72 hours following your procedure to check on you and address any questions or concerns that you may have regarding the information given to you following your procedure. If we do not reach you, we will leave a message.  We will attempt to reach you two times.  During this call, we will ask if you have developed any symptoms of COVID 19. If you develop any symptoms (ie: fever, flu-like symptoms, shortness of breath, cough etc.) before then, please call [phone].  If you test positive for Covid 19 in the 2 weeks post procedure, please call and report this information to [place].    If any biopsies were taken you will be contacted by phone or by letter within the next 1-3 weeks.  Please call us at [phone] if you have not heard about the biopsies in 3 weeks.    SIGNATURES/CONFIDENTIALITY:  You and/or your care partner have signed paperwork which will be entered into your electronic medical record.  These signatures attest to the fact that that the information above on your After Visit Summary has been reviewed and is understood.  Full responsibility of the confidentiality of this discharge information lies with you and/or your care-partner.

## 2021-01-23 NOTE — Op Note (Addendum)
[facility] Patient Name: Meredith [name] Procedure Date: 01/23/2021 3:35 PM MRN: [id number] Endoscopist: Mauri Pole , MD Age: 70 Referring MD:  Date of Birth: 11-01-1951 Gender: Female Account #: [id number] Procedure:                Upper GI endoscopy Indications:              Suspected upper gastrointestinal bleeding in                            patient with unexplained iron deficiency anemia Medicines:                Monitored Anesthesia Care Procedure:                Pre-Anesthesia Assessment:                           - Prior to the procedure, a History and Physical                            was performed, and patient medications and                            allergies were reviewed. The patient's tolerance of                            previous anesthesia was also reviewed. The risks                            and benefits of the procedure and the sedation                            options and risks were discussed with the patient.                            All questions were answered, and informed consent                            was obtained. Prior Anticoagulants: The patient has                            taken no previous anticoagulant or antiplatelet                            agents. ASA Grade Assessment: III - A patient with                            severe systemic disease. After reviewing the risks                            and benefits, the patient was deemed in                            satisfactory condition to undergo the procedure.  After obtaining informed consent, the endoscope was                            passed under direct vision. Throughout the                            procedure, the patient's blood pressure, pulse, and                            oxygen saturations were monitored continuously. The                            Endoscope was introduced through the mouth, and                            advanced  to the second part of duodenum. The upper                            GI endoscopy was accomplished without difficulty.                            The patient tolerated the procedure well. Scope In: Scope Out: Findings:                 The Z-line was regular and was found 35 cm from the                            incisors.                           One non-bleeding cratered gastric ulcer with no                            stigmata of bleeding was found at the pylorus. The                            lesion was less than one mm in largest dimension.                           Patchy mild inflammation characterized by                            congestion (edema), erosions and erythema was found                            in the prepyloric region of the stomach. Biopsies                            were taken with a cold forceps for Helicobacter                            pylori testing.                           Patchy  mildly erythematous mucosa without active                            bleeding was found in the duodenal bulb. Complications:            No immediate complications. Estimated Blood Loss:     Estimated blood loss was minimal. Impression:               - Z-line regular, 35 cm from the incisors.                           - Non-bleeding gastric ulcer with no stigmata of                            bleeding.                           - Gastritis. Biopsied.                           - Erythematous duodenopathy. Recommendation:           - Resume previous diet.                           - Continue present medications.                           - Await pathology results.                           - No high dose aspirin, ibuprofen, naproxen, or                            other non-steroidal anti-inflammatory drugs.                           - Use Protonix (pantoprazole) 40 mg PO daily for 3                            months.                           - Follow up in office in 2-3 months,  please call to                            schedule appointment Mauri Pole, MD 01/23/2021 4:04:45 PM This report has been signed electronically.

## 2021-01-23 NOTE — Progress Notes (Signed)
Called to room to assist during endoscopic procedure.  Patient ID and intended procedure confirmed with present staff. Received instructions for my participation in the procedure from the performing physician.  

## 2021-01-23 NOTE — Progress Notes (Signed)
To PACU, VSS. Report to [email or web address] 

## 2021-01-25 ENCOUNTER — Telehealth: Payer: Self-pay | Admitting: *Deleted

## 2021-01-25 NOTE — Telephone Encounter (Signed)
  Follow up Call-  Call back number 01/23/2021  Post procedure Call Back phone  # [phone]  Permission to leave phone message Yes  Some recent data might be hidden     Patient questions:  Do you have a fever, pain , or abdominal swelling? No. Pain Score  0 *  Have you tolerated food without any problems? Yes.    Have you been able to return to your normal activities? Yes.    Do you have any questions about your discharge instructions: Diet   No. Medications  No. Follow up visit  No.  Do you have questions or concerns about your Care? No.  Actions: * If pain score is 4 or above: 1. No action needed, pain <4.Have you developed a fever since your procedure? no  2.   Have you had an respiratory symptoms (SOB or cough) since your procedure? no  3.   Have you tested positive for COVID 19 since your procedure no  4.   Have you had any family members/close contacts diagnosed with the COVID 19 since your procedure?  no   If yes to any of these questions please route to Joylene John, RN and Joella Prince, RN

## 2021-01-25 NOTE — Telephone Encounter (Signed)
Follow up call made. 

## 2021-02-13 ENCOUNTER — Encounter: Payer: Self-pay | Admitting: Gastroenterology

## 2021-02-26 ENCOUNTER — Encounter: Payer: Self-pay | Admitting: Hematology and Oncology

## 2021-02-26 ENCOUNTER — Inpatient Hospital Stay: Payer: Medicare Other | Attending: Hematology and Oncology

## 2021-02-26 ENCOUNTER — Inpatient Hospital Stay: Payer: Medicare Other

## 2021-02-26 ENCOUNTER — Inpatient Hospital Stay (HOSPITAL_BASED_OUTPATIENT_CLINIC_OR_DEPARTMENT_OTHER): Payer: Medicare Other | Admitting: Hematology and Oncology

## 2021-02-26 ENCOUNTER — Other Ambulatory Visit: Payer: Self-pay | Admitting: Hematology and Oncology

## 2021-02-26 ENCOUNTER — Other Ambulatory Visit: Payer: Self-pay

## 2021-02-26 VITALS — BP 141/74 | HR 72 | Temp 97.7°F | Resp 18

## 2021-02-26 DIAGNOSIS — D509 Iron deficiency anemia, unspecified: Secondary | ICD-10-CM

## 2021-02-26 DIAGNOSIS — D519 Vitamin B12 deficiency anemia, unspecified: Secondary | ICD-10-CM | POA: Insufficient documentation

## 2021-02-26 DIAGNOSIS — E559 Vitamin D deficiency, unspecified: Secondary | ICD-10-CM | POA: Insufficient documentation

## 2021-02-26 LAB — CBC WITH DIFFERENTIAL/PLATELET
Abs Immature Granulocytes: 0.01 10*3/uL (ref 0.00–0.07)
Basophils Absolute: 0.1 10*3/uL (ref 0.0–0.1)
Basophils Relative: 1 %
Eosinophils Absolute: 0.2 10*3/uL (ref 0.0–0.5)
Eosinophils Relative: 3 %
HCT: 35.5 % — ABNORMAL LOW (ref 36.0–46.0)
Hemoglobin: 11.7 g/dL — ABNORMAL LOW (ref 12.0–15.0)
Immature Granulocytes: 0 %
Lymphocytes Relative: 25 %
Lymphs Abs: 1.5 10*3/uL (ref 0.7–4.0)
MCH: 30.2 pg (ref 26.0–34.0)
MCHC: 33 g/dL (ref 30.0–36.0)
MCV: 91.5 fL (ref 80.0–100.0)
Monocytes Absolute: 0.3 10*3/uL (ref 0.1–1.0)
Monocytes Relative: 5 %
Neutro Abs: 4 10*3/uL (ref 1.7–7.7)
Neutrophils Relative %: 66 %
Platelets: 220 10*3/uL (ref 150–400)
RBC: 3.88 MIL/uL (ref 3.87–5.11)
RDW: 15.7 % — ABNORMAL HIGH (ref 11.5–15.5)
WBC: 6.1 10*3/uL (ref 4.0–10.5)
nRBC: 0 % (ref 0.0–0.2)

## 2021-02-26 LAB — IRON AND TIBC
Iron: 79 ug/dL (ref 41–142)
Saturation Ratios: 24 % (ref 21–57)
TIBC: 329 ug/dL (ref 236–444)
UIBC: 250 ug/dL (ref 120–384)

## 2021-02-26 LAB — VITAMIN B12: Vitamin B-12: 306 pg/mL (ref 180–914)

## 2021-02-26 LAB — VITAMIN D 25 HYDROXY (VIT D DEFICIENCY, FRACTURES): Vit D, 25-Hydroxy: 55.36 ng/mL (ref 30–100)

## 2021-02-26 MED ORDER — DIPHENHYDRAMINE HCL 50 MG/ML IJ SOLN
INTRAMUSCULAR | Status: AC
  Filled 2021-02-26: qty 1

## 2021-02-26 MED ORDER — FAMOTIDINE IN NACL 20-0.9 MG/50ML-% IV SOLN
INTRAVENOUS | Status: AC
  Filled 2021-02-26: qty 50

## 2021-02-26 MED ORDER — DIPHENHYDRAMINE HCL 50 MG/ML IJ SOLN
25.0000 mg | Freq: Once | INTRAMUSCULAR | Status: AC
  Administered 2021-02-26: 25 mg via INTRAVENOUS

## 2021-02-26 MED ORDER — SODIUM CHLORIDE 0.9 % IV SOLN
Freq: Once | INTRAVENOUS | Status: AC
  Filled 2021-02-26: qty 250

## 2021-02-26 MED ORDER — SODIUM CHLORIDE 0.9 % IV SOLN
300.0000 mg | Freq: Once | INTRAVENOUS | Status: AC
  Administered 2021-02-26: 300 mg via INTRAVENOUS
  Filled 2021-02-26: qty 10

## 2021-02-26 MED ORDER — LORATADINE 10 MG PO TABS
10.0000 mg | ORAL_TABLET | Freq: Once | ORAL | Status: AC
  Administered 2021-02-26: 10 mg via ORAL

## 2021-02-26 MED ORDER — LORATADINE 10 MG PO TABS
ORAL_TABLET | ORAL | Status: AC
  Filled 2021-02-26: qty 1

## 2021-02-26 MED ORDER — FAMOTIDINE IN NACL 20-0.9 MG/50ML-% IV SOLN
20.0000 mg | Freq: Once | INTRAVENOUS | Status: AC
  Administered 2021-02-26: 20 mg via INTRAVENOUS

## 2021-02-26 NOTE — Assessment & Plan Note (Signed)
She has history of pernicious anemia Recent B12 level is adequate She will continue B12 supplement

## 2021-02-26 NOTE — Assessment & Plan Note (Signed)
She is on high-dose vitamin D replacement therapy She requests a vitamin D level to be drawn I will call her with test results tomorrow

## 2021-02-26 NOTE — Patient Instructions (Signed)
Iron Dextran injection °What is this medicine? °IRON DEXTRAN (AHY ern DEX tran) is an iron complex. Iron is used to make healthy red blood cells, which carry oxygen and nutrients through the body. This medicine is used to treat people who cannot take iron by mouth and have low levels of iron in the blood. °This medicine may be used for other purposes; ask your health care provider or pharmacist if you have questions. °COMMON BRAND NAME(S): Dexferrum, INFeD °What should I tell my health care provider before I take this medicine? °They need to know if you have any of these conditions: °· anemia not caused by low iron levels °· heart disease °· high levels of iron in the blood °· kidney disease °· liver disease °· an unusual or allergic reaction to iron, other medicines, foods, dyes, or preservatives °· pregnant or trying to get pregnant °· breast-feeding °How should I use this medicine? °This medicine is for injection into a vein or a muscle. It is given by a health care professional in a hospital or clinic setting. °Talk to your pediatrician regarding the use of this medicine in children. While this drug may be prescribed for children as young as 4 months old for selected conditions, precautions do apply. °Overdosage: If you think you have taken too much of this medicine contact a poison control center or emergency room at once. °NOTE: This medicine is only for you. Do not share this medicine with others. °What if I miss a dose? °It is important not to miss your dose. Call your doctor or health care professional if you are unable to keep an appointment. °What may interact with this medicine? °Do not take this medicine with any of the following medications: °· deferoxamine °· dimercaprol °· other iron products °This medicine may also interact with the following medications: °· chloramphenicol °· deferasirox °This list may not describe all possible interactions. Give your health care provider a list of all the  medicines, herbs, non-prescription drugs, or dietary supplements you use. Also tell them if you smoke, drink alcohol, or use illegal drugs. Some items may interact with your medicine. °What should I watch for while using this medicine? °Visit your doctor or health care professional regularly. Tell your doctor if your symptoms do not start to get better or if they get worse. You may need blood work done while you are taking this medicine. °You may need to follow a special diet. Talk to your doctor. Foods that contain iron include: whole grains/cereals, dried fruits, beans, or peas, leafy green vegetables, and organ meats (liver, kidney). °Long-term use of this medicine may increase your risk of some cancers. Talk to your doctor about how to limit your risk. °What side effects may I notice from receiving this medicine? °Side effects that you should report to your doctor or health care professional as soon as possible: °· allergic reactions like skin rash, itching or hives, swelling of the face, lips, or tongue °· blue lips, nails, or skin °· breathing problems °· changes in blood pressure °· chest pain °· confusion °· fast, irregular heartbeat °· feeling faint or lightheaded, falls °· fever or chills °· flushing, sweating, or hot feelings °· joint or muscle aches or pains °· pain, tingling, numbness in the hands or feet °· seizures °· unusually weak or tired °Side effects that usually do not require medical attention (report to your doctor or health care professional if they continue or are bothersome): °· change in taste (metallic taste) °·   diarrhea °· headache °· irritation at site where injected °· nausea, vomiting °· stomach upset °This list may not describe all possible side effects. Call your doctor for medical advice about side effects. You may report side effects to FDA at 1-800-FDA-1088. °Where should I keep my medicine? °This drug is given in a hospital or clinic and will not be stored at home. °NOTE: This  sheet is a summary. It may not cover all possible information. If you have questions about this medicine, talk to your doctor, pharmacist, or health care provider. °© 2021 [employer]vier/Gold Standard (2008-05-03 16:59:50) ° °

## 2021-02-26 NOTE — Assessment & Plan Note (Signed)
The most likely cause of her anemia is due to chronic blood loss/malabsorption syndrome. We discussed some of the risks, benefits, and alternatives of intravenous iron infusions. The patient is symptomatic from anemia and the iron level is critically low. She tolerated oral iron supplement poorly and desires to achieved higher levels of iron faster for adequate hematopoesis. Some of the side-effects to be expected including risks of infusion reactions, phlebitis, headaches, nausea and fatigue.  The patient is willing to proceed. Patient education material was dispensed.  Goal is to keep ferritin level greater than 50 and resolution of anemia I plan to repeat iron studies again in 3 months

## 2021-02-26 NOTE — Progress Notes (Signed)
[facility] OFFICE PROGRESS NOTE  Meredith Salvage, FNP  ASSESSMENT & PLAN:  Iron deficiency anemia The most likely cause of her anemia is due to chronic blood loss/malabsorption syndrome. We discussed some of the risks, benefits, and alternatives of intravenous iron infusions. The patient is symptomatic from anemia and the iron level is critically low. She tolerated oral iron supplement poorly and desires to achieved higher levels of iron faster for adequate hematopoesis. Some of the side-effects to be expected including risks of infusion reactions, phlebitis, headaches, nausea and fatigue.  The patient is willing to proceed. Patient education material was dispensed.  Goal is to keep ferritin level greater than 50 and resolution of anemia I plan to repeat iron studies again in 3 months   B12 deficiency anemia She has history of pernicious anemia Recent B12 level is adequate She will continue B12 supplement  Vitamin D deficiency She is on high-dose vitamin D replacement therapy She requests a vitamin D level to be drawn I will call her with test results tomorrow   Orders Placed This Encounter  Procedures  . Ferritin    Standing Status:   Standing    Number of Occurrences:   2    Standing Expiration Date:   02/26/2022    The total time spent in the appointment was 20 minutes encounter with patients including review of chart and various tests results, discussions about plan of care and coordination of care plan   All questions were answered. The patient knows to call the clinic with any problems, questions or concerns. No barriers to learning was detected.    Heath Lark, MD 2/28/20223:08 PM  INTERVAL HISTORY: Meredith [name] 70 y.o. female returns for further follow-up for multiple mineral deficiencies She continues to have fatigue She tolerated recent IV iron well The patient denies any recent signs or symptoms of bleeding such as spontaneous epistaxis,  hematuria or hematochezia. Denies recent infusion reaction  SUMMARY OF HEMATOLOGIC HISTORY:  She was found to have abnormal CBC from recent blood work I have the opportunity to review her CBC dated back to June 2015 Hemoglobin ranged from 10.3-11.9 Her most recent blood work show evidence of severe iron deficiency She had EGD and colonoscopy performed in 2015 which showed no evidence of GI bleed She denies recent chest pain on exertion, but does complain of shortness of breath on minimal exertion and excessive fatigue. She had not noticed any recent bleeding such as epistaxis but did states she has occasional hemorrhoidal bleeding once a month She had taken NSAID in the past for chronic joint pain but nothing recently.  She is on chronic aspirin therapy  She had history of abnormal PAP smear but no prior history of cancer. Her age appropriate screening programs are up-to-date. She eats a variety of diet She used to donate blood a lot in the past but nothing in the last 20 years She has pica and craves ice The patient was prescribed oral iron supplements and she takes in the past but that cause significant constipation Between December 2021 to January 2022, she received multiple doses of IV iron  I have reviewed the past medical history, past surgical history, social history and family history with the patient and they are unchanged from previous note.  ALLERGIES:  has No Known Allergies.  MEDICATIONS:  Current Outpatient Medications  Medication Sig Dispense Refill  . Vitamin D, Ergocalciferol, (DRISDOL) 1.25 MG (50000 UNIT) CAPS capsule TAKE 1 CAPSULE (50,000 UNITS TOTAL)  BY MOUTH EVERY 7 (SEVEN) DAYS FOR 12 DOSES. 12 capsule 0  . ALPRAZolam (XANAX) 0.5 MG tablet Take 1 tablet (0.5 mg total) by mouth 2 (two) times daily as needed for anxiety. 60 tablet 0  . amLODipine (NORVASC) 2.5 MG tablet TAKE 1 TABLET(2.5 MG) BY MOUTH DAILY 90 tablet 3  . aspirin 81 MG tablet Take 81 mg by mouth  daily.    . Biotin 1000 MCG tablet     . escitalopram (LEXAPRO) 20 MG tablet Take 1 tablet (20 mg total) by mouth daily. 90 tablet 3  . folic acid (FOLVITE) 229 MCG tablet Take 400 mcg by mouth daily.    . furosemide (LASIX) 20 MG tablet Take 1 tablet (20 mg total) by mouth 2 (two) times daily. 180 tablet 3  . irbesartan (AVAPRO) 300 MG tablet Take 1 tablet (300 mg total) by mouth daily. 90 tablet 3  . linagliptin (TRADJENTA) 5 MG TABS tablet Take 1 tablet (5 mg total) by mouth daily. 90 tablet 3  . metFORMIN (GLUCOPHAGE) 1000 MG tablet TAKE 1 TABLET BY MOUTH TWICE DAILY WITH A MEAL 180 tablet 3  . pantoprazole (PROTONIX) 40 MG tablet Take 1 tablet (40 mg total) by mouth daily. 30 tablet 2  . rosuvastatin (CRESTOR) 10 MG tablet TAKE 1 TABLET BY MOUTH EVERY DAY 90 tablet 3  . Turmeric 500 MG CAPS      Current Facility-Administered Medications  Medication Dose Route Frequency Provider Last Rate Last Admin  . 0.9 %  sodium chloride infusion  500 mL Intravenous Once Nandigam, Venia Minks, MD       Facility-Administered Medications Ordered in Other Visits  Medication Dose Route Frequency Provider Last Rate Last Admin  . iron sucrose (VENOFER) 300 mg in sodium chloride 0.9 % 250 mL IVPB  300 mg Intravenous Once Alvy Bimler, [place] Gavitt, MD 176.7 mL/hr at 02/26/21 1411 300 mg at 02/26/21 1411     REVIEW OF SYSTEMS:   Constitutional: Denies fevers, chills or night sweats Eyes: Denies blurriness of vision Ears, nose, mouth, throat, and face: Denies mucositis or sore throat Respiratory: Denies cough, dyspnea or wheezes Cardiovascular: Denies palpitation, chest discomfort or lower extremity swelling Gastrointestinal:  Denies nausea, heartburn or change in bowel habits Skin: Denies abnormal skin rashes Lymphatics: Denies new lymphadenopathy or easy bruising Neurological:Denies numbness, tingling or new weaknesses Behavioral/Psych: Mood is stable, no new changes  All other systems were reviewed with the patient  and are negative.  PHYSICAL EXAMINATION: ECOG PERFORMANCE STATUS: 1 - Symptomatic but completely ambulatory  Vitals:   02/26/21 1232  BP: 135/85  Pulse: 72  Resp: 17  Temp: (!) 97.3 F (36.3 C)  SpO2: 99%   Filed Weights   02/26/21 1232  Weight: 213 lb (96.6 kg)    GENERAL:alert, no distress and comfortable NEURO: alert & oriented x 3 with fluent speech, no focal motor/sensory deficits  LABORATORY DATA:  I have reviewed the data as listed     Component Value Date/Time   NA 138 10/31/2020 1019   K 4.1 10/31/2020 1019   CL 103 10/31/2020 1019   CO2 26 10/31/2020 1019   GLUCOSE 165 (H) 10/31/2020 1019   BUN 12 10/31/2020 1019   CREATININE 0.72 10/31/2020 1019   CALCIUM 9.2 10/31/2020 1019   PROT 7.1 10/31/2020 1019   ALBUMIN 3.9 10/31/2020 1019   AST 16 10/31/2020 1019   ALT 11 10/31/2020 1019   ALKPHOS 71 10/31/2020 1019   BILITOT 0.4 10/31/2020 1019   GFRNONAA  79.93 07/12/2010 1707    No results found for: SPEP, UPEP  Lab Results  Component Value Date   WBC 6.1 02/26/2021   NEUTROABS 4.0 02/26/2021   HGB 11.7 (L) 02/26/2021   HCT 35.5 (L) 02/26/2021   MCV 91.5 02/26/2021   PLT 220 02/26/2021      Chemistry      Component Value Date/Time   NA 138 10/31/2020 1019   K 4.1 10/31/2020 1019   CL 103 10/31/2020 1019   CO2 26 10/31/2020 1019   BUN 12 10/31/2020 1019   CREATININE 0.72 10/31/2020 1019      Component Value Date/Time   CALCIUM 9.2 10/31/2020 1019   ALKPHOS 71 10/31/2020 1019   AST 16 10/31/2020 1019   ALT 11 10/31/2020 1019   BILITOT 0.4 10/31/2020 1019

## 2021-02-27 ENCOUNTER — Telehealth: Payer: Self-pay

## 2021-02-27 NOTE — Telephone Encounter (Signed)
Patient notified of MD recommendations.  Verbalized understanding and agreement.  Upcoming appointments confirmed.  No further needs at this time.

## 2021-02-27 NOTE — Telephone Encounter (Signed)
-----   Message from Heath Lark, MD sent at 02/27/2021  8:16 AM EST ----- Regarding: labs Let her know B12 and vitamin D are good She can stop vitamin D supplement She should continue B12 supplement over the counter See her in 3 months as schediled

## 2021-03-13 ENCOUNTER — Ambulatory Visit (HOSPITAL_COMMUNITY)
Admission: EM | Admit: 2021-03-13 | Discharge: 2021-03-13 | Disposition: A | Discharge status: Home / Self Care | Payer: Medicare Other | Attending: Urgent Care | Admitting: Urgent Care

## 2021-03-13 ENCOUNTER — Encounter (HOSPITAL_COMMUNITY): Payer: Self-pay

## 2021-03-13 ENCOUNTER — Other Ambulatory Visit: Payer: Self-pay

## 2021-03-13 DIAGNOSIS — J019 Acute sinusitis, unspecified: Secondary | ICD-10-CM | POA: Diagnosis not present

## 2021-03-13 DIAGNOSIS — R059 Cough, unspecified: Secondary | ICD-10-CM

## 2021-03-13 MED ORDER — AMOXICILLIN 875 MG PO TABS: 875.0000 mg | ORAL_TABLET | Freq: Two times a day (BID) | ORAL | 0 refills | Status: DC

## 2021-03-13 MED ORDER — PROMETHAZINE-DM 6.25-15 MG/5ML PO SYRP: 5.0000 mL | ORAL_SOLUTION | Freq: Every evening | ORAL | 0 refills | Status: DC | PRN

## 2021-03-13 MED ORDER — CETIRIZINE HCL 10 MG PO TABS: 10.0000 mg | ORAL_TABLET | Freq: Every day | ORAL | 0 refills | Status: DC

## 2021-03-13 NOTE — ED Provider Notes (Signed)
[place]   MRN: [id number] DOB: [birth year]  Subjective:   Meredith [name] is a 69 y.o. female presenting for 1 week history of persistent sore throat, sinus headache, sinus pain, cough, fatigue.  Patient states that her symptoms are worsening and has been waking with pain and pressure behind her eyes.  She has been blowing her nose and having a lot of mucus, now.  Denies fever, chest pain, shortness of breath, body aches.  She did get a COVID-19 test last week and was negative.  Does not want repeat testing.  She has been using over-the-counter cough medications with minimal relief.  Has a history of emphysema but does not need to use an inhaler regularly.  She does have diabetes, last A1c was less than 8% within the past 3 months.  Patient is a former smoker.   Current Facility-Administered Medications:  .  0.9 %  sodium chloride infusion, 500 mL, Intravenous, Once, Nandigam, Venia Minks, MD  Current Outpatient Medications:  Marland Kitchen  Vitamin D, Ergocalciferol, (DRISDOL) 1.25 MG (50000 UNIT) CAPS capsule, TAKE 1 CAPSULE (50,000 UNITS TOTAL) BY MOUTH EVERY 7 (SEVEN) DAYS FOR 12 DOSES., Disp: 12 capsule, Rfl: 0 .  ALPRAZolam (XANAX) 0.5 MG tablet, Take 1 tablet (0.5 mg total) by mouth 2 (two) times daily as needed for anxiety., Disp: 60 tablet, Rfl: 0 .  amLODipine (NORVASC) 2.5 MG tablet, TAKE 1 TABLET(2.5 MG) BY MOUTH DAILY, Disp: 90 tablet, Rfl: 3 .  aspirin 81 MG tablet, Take 81 mg by mouth daily., Disp: , Rfl:  .  Biotin 1000 MCG tablet, , Disp: , Rfl:  .  escitalopram (LEXAPRO) 20 MG tablet, Take 1 tablet (20 mg total) by mouth daily., Disp: 90 tablet, Rfl: 3 .  folic acid (FOLVITE) 353 MCG tablet, Take 400 mcg by mouth daily., Disp: , Rfl:  .  furosemide (LASIX) 20 MG tablet, Take 1 tablet (20 mg total) by mouth 2 (two) times daily., Disp: 180 tablet, Rfl: 3 .  irbesartan (AVAPRO) 300 MG tablet, Take 1 tablet (300 mg total) by mouth daily., Disp: 90 tablet, Rfl: 3 .   linagliptin (TRADJENTA) 5 MG TABS tablet, Take 1 tablet (5 mg total) by mouth daily., Disp: 90 tablet, Rfl: 3 .  metFORMIN (GLUCOPHAGE) 1000 MG tablet, TAKE 1 TABLET BY MOUTH TWICE DAILY WITH A MEAL, Disp: 180 tablet, Rfl: 3 .  pantoprazole (PROTONIX) 40 MG tablet, Take 1 tablet (40 mg total) by mouth daily., Disp: 30 tablet, Rfl: 2 .  rosuvastatin (CRESTOR) 10 MG tablet, TAKE 1 TABLET BY MOUTH EVERY DAY, Disp: 90 tablet, Rfl: 3 .  Turmeric 500 MG CAPS, , Disp: , Rfl:    No Known Allergies  Past Medical History:  Diagnosis Date  . Anemia   . Anxiety state, unspecified   . Arthritis    KNEES  . B12 deficiency anemia 05/2014 dx  . CIN I (cervical intraepithelial neoplasia I)   . Depressive disorder, not elsewhere classified   . Diabetes mellitus without complication ([place])   . Emphysema of lung ([place])   . Hypertension   . Internal hemorrhoids   . Other and unspecified hyperlipidemia   . Unspecified sleep apnea      Past Surgical History:  Procedure Laterality Date  . CERVICAL CERCLAGE    . CESAREAN SECTION    . COLONOSCOPY    . HEMORRHOID SURGERY    . KNEE ARTHROSCOPY Left 12/2014  . UPPER GASTROINTESTINAL ENDOSCOPY  Family History  Problem Relation Age of Onset  . Heart attack Father 56  . Diabetes Father   . Cancer Father        lung  . Heart disease Father   . Multiple sclerosis Mother   . Breast cancer Neg Hx   . Colon cancer Neg Hx   . Esophageal cancer Neg Hx   . Pancreatic cancer Neg Hx     Social History   Tobacco Use  . Smoking status: Former Smoker    Packs/day: 1.00    Years: 30.00    Pack years: 30.00    Types: Cigarettes  . Smokeless tobacco: Never Used  Vaping Use  . Vaping Use: Never used  Substance Use Topics  . Alcohol use: Yes    Alcohol/week: 6.0 standard drinks    Types: 6 Glasses of wine per week    Comment: 6 glasses or wine each week  . Drug use: No    ROS   Objective:   Vitals: BP 128/73 (BP Location: Right Arm)   Pulse  77   Temp (!) 97.5 F (36.4 C) (Oral)   Resp 17   LMP 12/30/1998 (Approximate)   SpO2 98%   Physical Exam Constitutional:      General: She is not in acute distress.    Appearance: Normal appearance. She is well-developed. She is not ill-appearing, toxic-appearing or diaphoretic.  HENT:     Head: Normocephalic and atraumatic.     Nose: Congestion present.     Mouth/Throat:     Mouth: Mucous membranes are moist.     Pharynx: No oropharyngeal exudate or posterior oropharyngeal erythema.  Eyes:     Extraocular Movements: Extraocular movements intact.     Pupils: Pupils are equal, round, and reactive to light.  Cardiovascular:     Rate and Rhythm: Normal rate and regular rhythm.     Pulses: Normal pulses.     Heart sounds: Normal heart sounds. No murmur heard. No friction rub. No gallop.   Pulmonary:     Effort: Pulmonary effort is normal. No respiratory distress.     Breath sounds: Normal breath sounds. No stridor. No wheezing, rhonchi or rales.  Skin:    General: Skin is warm and dry.     Findings: No rash.  Neurological:     Mental Status: She is alert and oriented to person, place, and time.  Psychiatric:        Mood and Affect: Mood normal.        Behavior: Behavior normal.        Thought Content: Thought content normal.        Judgment: Judgment normal.       Assessment and Plan :   PDMP not reviewed this encounter.  1. Acute sinusitis, recurrence not specified, unspecified location   2. Cough     Will start empiric treatment for sinusitis with amoxicillin.  Recommended supportive care otherwise including the use of oral antihistamine, decongestant.  Deferred imaging to clear cardiopulmonary exam.  Counseled patient on potential for adverse effects with medications prescribed/recommended today, ER and return-to-clinic precautions discussed, patient verbalized understanding.    Jaynee Eagles, [place] 03/13/21 1903

## 2021-03-13 NOTE — ED Triage Notes (Signed)
Pt c/o being since x 1 week. She states she has a sore throat, cough and states she is extremely tired. Pt states she has had some fever.

## 2021-04-09 ENCOUNTER — Other Ambulatory Visit: Payer: Self-pay | Admitting: Family

## 2021-04-20 ENCOUNTER — Other Ambulatory Visit: Payer: Self-pay | Admitting: Gastroenterology

## 2021-04-26 ENCOUNTER — Encounter: Payer: Self-pay | Admitting: Family

## 2021-04-28 ENCOUNTER — Other Ambulatory Visit: Payer: Self-pay | Admitting: Family

## 2021-05-02 DIAGNOSIS — M1711 Unilateral primary osteoarthritis, right knee: Secondary | ICD-10-CM | POA: Diagnosis not present

## 2021-05-02 DIAGNOSIS — M170 Bilateral primary osteoarthritis of knee: Secondary | ICD-10-CM | POA: Diagnosis not present

## 2021-05-29 ENCOUNTER — Inpatient Hospital Stay: Payer: Medicare Other | Attending: Hematology and Oncology

## 2021-05-29 ENCOUNTER — Other Ambulatory Visit: Payer: Self-pay

## 2021-05-29 ENCOUNTER — Inpatient Hospital Stay (HOSPITAL_BASED_OUTPATIENT_CLINIC_OR_DEPARTMENT_OTHER): Payer: Medicare Other | Admitting: Hematology and Oncology

## 2021-05-29 ENCOUNTER — Inpatient Hospital Stay: Payer: Medicare Other

## 2021-05-29 ENCOUNTER — Encounter: Payer: Self-pay | Admitting: Hematology and Oncology

## 2021-05-29 VITALS — BP 130/58 | HR 68 | Resp 18 | Ht 65.0 in | Wt 208.8 lb

## 2021-05-29 VITALS — BP 134/72 | HR 70 | Temp 98.1°F | Resp 18

## 2021-05-29 DIAGNOSIS — D519 Vitamin B12 deficiency anemia, unspecified: Secondary | ICD-10-CM | POA: Insufficient documentation

## 2021-05-29 DIAGNOSIS — E119 Type 2 diabetes mellitus without complications: Secondary | ICD-10-CM

## 2021-05-29 DIAGNOSIS — K5900 Constipation, unspecified: Secondary | ICD-10-CM | POA: Diagnosis not present

## 2021-05-29 DIAGNOSIS — Z7982 Long term (current) use of aspirin: Secondary | ICD-10-CM | POA: Insufficient documentation

## 2021-05-29 DIAGNOSIS — D509 Iron deficiency anemia, unspecified: Secondary | ICD-10-CM | POA: Insufficient documentation

## 2021-05-29 DIAGNOSIS — Z7984 Long term (current) use of oral hypoglycemic drugs: Secondary | ICD-10-CM | POA: Insufficient documentation

## 2021-05-29 LAB — CBC WITH DIFFERENTIAL/PLATELET
Abs Immature Granulocytes: 0.02 10*3/uL (ref 0.00–0.07)
Basophils Absolute: 0.1 10*3/uL (ref 0.0–0.1)
Basophils Relative: 1 %
Eosinophils Absolute: 0.3 10*3/uL (ref 0.0–0.5)
Eosinophils Relative: 5 %
HCT: 35 % — ABNORMAL LOW (ref 36.0–46.0)
Hemoglobin: 11.8 g/dL — ABNORMAL LOW (ref 12.0–15.0)
Immature Granulocytes: 0 %
Lymphocytes Relative: 31 %
Lymphs Abs: 1.8 10*3/uL (ref 0.7–4.0)
MCH: 31.9 pg (ref 26.0–34.0)
MCHC: 33.7 g/dL (ref 30.0–36.0)
MCV: 94.6 fL (ref 80.0–100.0)
Monocytes Absolute: 0.4 10*3/uL (ref 0.1–1.0)
Monocytes Relative: 7 %
Neutro Abs: 3.3 10*3/uL (ref 1.7–7.7)
Neutrophils Relative %: 56 %
Platelets: 217 10*3/uL (ref 150–400)
RBC: 3.7 MIL/uL — ABNORMAL LOW (ref 3.87–5.11)
RDW: 13.2 % (ref 11.5–15.5)
WBC: 5.9 10*3/uL (ref 4.0–10.5)
nRBC: 0 % (ref 0.0–0.2)

## 2021-05-29 LAB — IRON AND TIBC
Iron: 83 ug/dL (ref 41–142)
Saturation Ratios: 25 % (ref 21–57)
TIBC: 326 ug/dL (ref 236–444)
UIBC: 244 ug/dL (ref 120–384)

## 2021-05-29 LAB — FERRITIN: Ferritin: 152 ng/mL (ref 11–307)

## 2021-05-29 MED ORDER — DIPHENHYDRAMINE HCL 50 MG/ML IJ SOLN
25.0000 mg | Freq: Once | INTRAMUSCULAR | Status: AC
Start: 2021-05-29 — End: 2021-05-29
  Administered 2021-05-29: 25 mg via INTRAVENOUS

## 2021-05-29 MED ORDER — FAMOTIDINE 20 MG IN NS 100 ML IVPB
20.0000 mg | Freq: Once | INTRAVENOUS | Status: AC
  Administered 2021-05-29: 20 mg via INTRAVENOUS

## 2021-05-29 MED ORDER — DIPHENHYDRAMINE HCL 50 MG/ML IJ SOLN
INTRAMUSCULAR | Status: AC
  Filled 2021-05-29: qty 1

## 2021-05-29 MED ORDER — SODIUM CHLORIDE 0.9 % IV SOLN
300.0000 mg | Freq: Once | INTRAVENOUS | Status: AC
  Administered 2021-05-29: 300 mg via INTRAVENOUS
  Filled 2021-05-29: qty 300

## 2021-05-29 MED ORDER — SODIUM CHLORIDE 0.9 % IV SOLN
INTRAVENOUS | Status: DC
  Filled 2021-05-29: qty 250

## 2021-05-29 MED ORDER — LORATADINE 10 MG PO TABS
ORAL_TABLET | ORAL | Status: AC
  Filled 2021-05-29: qty 1

## 2021-05-29 MED ORDER — FAMOTIDINE 20 MG IN NS 100 ML IVPB
INTRAVENOUS | Status: AC
  Filled 2021-05-29: qty 100

## 2021-05-29 MED ORDER — LORATADINE 10 MG PO TABS
10.0000 mg | ORAL_TABLET | Freq: Once | ORAL | Status: AC
  Administered 2021-05-29: 10 mg via ORAL

## 2021-05-29 NOTE — Assessment & Plan Note (Signed)
She has borderline vitamin B12 deficiency She is taking oral B12 supplement I plan to recheck B12 in her next visit

## 2021-05-29 NOTE — Patient Instructions (Signed)

## 2021-05-29 NOTE — Progress Notes (Signed)
[place] OFFICE PROGRESS NOTE  Marrian Salvage, FNP  ASSESSMENT & PLAN:  Iron deficiency anemia The most likely cause of her anemia is due to chronic blood loss/malabsorption syndrome. We discussed some of the risks, benefits, and alternatives of intravenous iron infusions. The patient is symptomatic from anemia and the iron level is critically low. She tolerated oral iron supplement poorly and desires to achieved higher levels of iron faster for adequate hematopoesis. Some of the side-effects to be expected including risks of infusion reactions, phlebitis, headaches, nausea and fatigue.  The patient is willing to proceed. Patient education material was dispensed.  Goal is to keep ferritin level greater than 50 and resolution of anemia She will proceed with IV iron today due to persistent anemia I plan to repeat iron studies again in 3 months   B12 deficiency anemia She has borderline vitamin B12 deficiency She is taking oral B12 supplement I plan to recheck B12 in her next visit   Orders Placed This Encounter  Procedures  . Vitamin B12    Standing Status:   Standing    Number of Occurrences:   1    Standing Expiration Date:   05/29/2022  . Iron and TIBC    Standing Status:   Standing    Number of Occurrences:   1    Standing Expiration Date:   05/29/2022    The total time spent in the appointment was 20 minutes encounter with patients including review of chart and various tests results, discussions about plan of care and coordination of care plan   All questions were answered. The patient knows to call the clinic with any problems, questions or concerns. No barriers to learning was detected.    Heath Lark, MD 5/31/20223:25 PM  INTERVAL HISTORY: Meredith [name] 70 y.o. female returns for iron deficiency anemia She is doing well since her last IV iron treatment She started taking oral vitamin B12 The patient denies any recent signs or symptoms of bleeding  such as spontaneous epistaxis, hematuria or hematochezia.  SUMMARY OF HEMATOLOGIC HISTORY:  She was found to have abnormal CBC from recent blood work I have the opportunity to review her CBC dated back to June 2015 Hemoglobin ranged from 10.3-11.9 Her most recent blood work show evidence of severe iron deficiency She had EGD and colonoscopy performed in 2015 which showed no evidence of GI bleed She denies recent chest pain on exertion, but does complain of shortness of breath on minimal exertion and excessive fatigue. She had not noticed any recent bleeding such as epistaxis but did states she has occasional hemorrhoidal bleeding once a month She had taken NSAID in the past for chronic joint pain but nothing recently.  She is on chronic aspirin therapy  She had history of abnormal PAP smear but no prior history of cancer. Her age appropriate screening programs are up-to-date. She eats a variety of diet She used to donate blood a lot in the past but nothing in the last 20 years She has pica and craves ice The patient was prescribed oral iron supplements and she takes in the past but that cause significant constipation Between December 2021 to February 2022, she received multiple doses of IV iron  I have reviewed the past medical history, past surgical history, social history and family history with the patient and they are unchanged from previous note.  ALLERGIES:  has No Known Allergies.  MEDICATIONS:  Current Outpatient Medications  Medication Sig Dispense Refill  .  vitamin B-12 (CYANOCOBALAMIN) 1000 MCG tablet Take 1,000 mcg by mouth daily.    Marland Kitchen ALPRAZolam (XANAX) 0.5 MG tablet Take 1 tablet (0.5 mg total) by mouth 2 (two) times daily as needed for anxiety. 60 tablet 0  . amLODipine (NORVASC) 2.5 MG tablet TAKE 1 TABLET(2.5 MG) BY MOUTH DAILY 90 tablet 3  . aspirin 81 MG tablet Take 81 mg by mouth daily.    . Biotin 1000 MCG tablet     . cetirizine (ZYRTEC ALLERGY) 10 MG tablet Take  1 tablet (10 mg total) by mouth daily. 30 tablet 0  . escitalopram (LEXAPRO) 20 MG tablet Take 1 tablet (20 mg total) by mouth daily. 90 tablet 3  . folic acid (FOLVITE) 712 MCG tablet Take 400 mcg by mouth daily.    . furosemide (LASIX) 20 MG tablet Take 1 tablet (20 mg total) by mouth 2 (two) times daily. 180 tablet 3  . irbesartan (AVAPRO) 300 MG tablet Take 1 tablet (300 mg total) by mouth daily. 90 tablet 3  . linagliptin (TRADJENTA) 5 MG TABS tablet Take 1 tablet (5 mg total) by mouth daily. 90 tablet 3  . metFORMIN (GLUCOPHAGE) 1000 MG tablet TAKE 1 TABLET BY MOUTH TWICE DAILY WITH A MEAL 180 tablet 3  . pantoprazole (PROTONIX) 40 MG tablet TAKE 1 TABLET BY MOUTH EVERY DAY 90 tablet 0  . rosuvastatin (CRESTOR) 10 MG tablet TAKE 1 TABLET BY MOUTH EVERY DAY 90 tablet 3  . Turmeric 500 MG CAPS      Current Facility-Administered Medications  Medication Dose Route Frequency Provider Last Rate Last Admin  . 0.9 %  sodium chloride infusion  500 mL Intravenous Once Nandigam, Venia Minks, MD       Facility-Administered Medications Ordered in Other Visits  Medication Dose Route Frequency Provider Last Rate Last Admin  . 0.9 %  sodium chloride infusion   Intravenous Continuous Heath Lark, MD 20 mL/hr at 05/29/21 1319 New Bag at 05/29/21 1319  . iron sucrose (VENOFER) 300 mg in sodium chloride 0.9 % 250 mL IVPB  300 mg Intravenous Once Alvy Bimler, Victory Strollo, MD 176.7 mL/hr at 05/29/21 1432 300 mg at 05/29/21 1432     REVIEW OF SYSTEMS:   Constitutional: Denies fevers, chills or night sweats Eyes: Denies blurriness of vision Ears, nose, mouth, throat, and face: Denies mucositis or sore throat Respiratory: Denies cough, dyspnea or wheezes Cardiovascular: Denies palpitation, chest discomfort or lower extremity swelling Gastrointestinal:  Denies nausea, heartburn or change in bowel habits Skin: Denies abnormal skin rashes Lymphatics: Denies new lymphadenopathy or easy bruising Neurological:Denies numbness,  tingling or new weaknesses Behavioral/Psych: Mood is stable, no new changes  All other systems were reviewed with the patient and are negative.  PHYSICAL EXAMINATION: ECOG PERFORMANCE STATUS: 0 - Asymptomatic  Vitals:   05/29/21 1232  BP: (!) 130/58  Pulse: 68  Resp: 18  SpO2: 97%   Filed Weights   05/29/21 1232  Weight: 208 lb 12.8 oz (94.7 kg)    GENERAL:alert, no distress and comfortable NEURO: alert & oriented x 3 with fluent speech, no focal motor/sensory deficits  LABORATORY DATA:  I have reviewed the data as listed     Component Value Date/Time   NA 138 10/31/2020 1019   K 4.1 10/31/2020 1019   CL 103 10/31/2020 1019   CO2 26 10/31/2020 1019   GLUCOSE 165 (H) 10/31/2020 1019   BUN 12 10/31/2020 1019   CREATININE 0.72 10/31/2020 1019   CALCIUM 9.2 10/31/2020 1019  PROT 7.1 10/31/2020 1019   ALBUMIN 3.9 10/31/2020 1019   AST 16 10/31/2020 1019   ALT 11 10/31/2020 1019   ALKPHOS 71 10/31/2020 1019   BILITOT 0.4 10/31/2020 1019   GFRNONAA 79.93 07/12/2010 1707    No results found for: SPEP, UPEP  Lab Results  Component Value Date   WBC 5.9 05/29/2021   NEUTROABS 3.3 05/29/2021   HGB 11.8 (L) 05/29/2021   HCT 35.0 (L) 05/29/2021   MCV 94.6 05/29/2021   PLT 217 05/29/2021      Chemistry      Component Value Date/Time   NA 138 10/31/2020 1019   K 4.1 10/31/2020 1019   CL 103 10/31/2020 1019   CO2 26 10/31/2020 1019   BUN 12 10/31/2020 1019   CREATININE 0.72 10/31/2020 1019      Component Value Date/Time   CALCIUM 9.2 10/31/2020 1019   ALKPHOS 71 10/31/2020 1019   AST 16 10/31/2020 1019   ALT 11 10/31/2020 1019   BILITOT 0.4 10/31/2020 1019

## 2021-05-29 NOTE — Assessment & Plan Note (Signed)
The most likely cause of her anemia is due to chronic blood loss/malabsorption syndrome. We discussed some of the risks, benefits, and alternatives of intravenous iron infusions. The patient is symptomatic from anemia and the iron level is critically low. She tolerated oral iron supplement poorly and desires to achieved higher levels of iron faster for adequate hematopoesis. Some of the side-effects to be expected including risks of infusion reactions, phlebitis, headaches, nausea and fatigue.  The patient is willing to proceed. Patient education material was dispensed.  Goal is to keep ferritin level greater than 50 and resolution of anemia She will proceed with IV iron today due to persistent anemia I plan to repeat iron studies again in 3 months

## 2021-05-30 ENCOUNTER — Telehealth: Payer: Self-pay | Admitting: Hematology and Oncology

## 2021-05-30 LAB — HEMOGLOBIN A1C
Hgb A1c MFr Bld: 6.5 % — ABNORMAL HIGH (ref 4.8–5.6)
Mean Plasma Glucose: 140 mg/dL

## 2021-05-30 NOTE — Telephone Encounter (Signed)
Scheduled per 5/31 sch msg. Called and spoke with pt, confirmed 8/30 appts

## 2021-06-08 DIAGNOSIS — H6123 Impacted cerumen, bilateral: Secondary | ICD-10-CM | POA: Diagnosis not present

## 2021-06-08 DIAGNOSIS — Z23 Encounter for immunization: Secondary | ICD-10-CM | POA: Diagnosis not present

## 2021-06-20 DIAGNOSIS — M1711 Unilateral primary osteoarthritis, right knee: Secondary | ICD-10-CM | POA: Diagnosis not present

## 2021-06-27 DIAGNOSIS — M1711 Unilateral primary osteoarthritis, right knee: Secondary | ICD-10-CM | POA: Diagnosis not present

## 2021-07-04 DIAGNOSIS — M1711 Unilateral primary osteoarthritis, right knee: Secondary | ICD-10-CM | POA: Diagnosis not present

## 2021-07-08 DIAGNOSIS — Z20822 Contact with and (suspected) exposure to covid-19: Secondary | ICD-10-CM | POA: Diagnosis not present

## 2021-07-13 DIAGNOSIS — H2511 Age-related nuclear cataract, right eye: Secondary | ICD-10-CM | POA: Diagnosis not present

## 2021-07-13 DIAGNOSIS — H40013 Open angle with borderline findings, low risk, bilateral: Secondary | ICD-10-CM | POA: Diagnosis not present

## 2021-07-13 DIAGNOSIS — H35033 Hypertensive retinopathy, bilateral: Secondary | ICD-10-CM | POA: Diagnosis not present

## 2021-07-13 DIAGNOSIS — H25011 Cortical age-related cataract, right eye: Secondary | ICD-10-CM | POA: Diagnosis not present

## 2021-07-13 DIAGNOSIS — H35372 Puckering of macula, left eye: Secondary | ICD-10-CM | POA: Diagnosis not present

## 2021-07-13 DIAGNOSIS — H35361 Drusen (degenerative) of macula, right eye: Secondary | ICD-10-CM | POA: Diagnosis not present

## 2021-07-13 DIAGNOSIS — E119 Type 2 diabetes mellitus without complications: Secondary | ICD-10-CM | POA: Diagnosis not present

## 2021-08-06 ENCOUNTER — Other Ambulatory Visit: Payer: Self-pay | Admitting: Family

## 2021-08-06 DIAGNOSIS — Z1231 Encounter for screening mammogram for malignant neoplasm of breast: Secondary | ICD-10-CM

## 2021-08-13 ENCOUNTER — Encounter: Payer: Self-pay | Admitting: Gastroenterology

## 2021-08-16 DIAGNOSIS — M1712 Unilateral primary osteoarthritis, left knee: Secondary | ICD-10-CM | POA: Diagnosis not present

## 2021-08-16 DIAGNOSIS — M1711 Unilateral primary osteoarthritis, right knee: Secondary | ICD-10-CM | POA: Diagnosis not present

## 2021-08-23 DIAGNOSIS — M1712 Unilateral primary osteoarthritis, left knee: Secondary | ICD-10-CM | POA: Diagnosis not present

## 2021-08-28 ENCOUNTER — Other Ambulatory Visit: Payer: Medicare Other

## 2021-08-28 ENCOUNTER — Ambulatory Visit: Payer: Medicare Other | Admitting: Hematology and Oncology

## 2021-08-28 ENCOUNTER — Ambulatory Visit: Payer: Medicare Other

## 2021-08-30 DIAGNOSIS — M1712 Unilateral primary osteoarthritis, left knee: Secondary | ICD-10-CM | POA: Diagnosis not present

## 2021-09-04 ENCOUNTER — Ambulatory Visit: Payer: Medicare Other

## 2021-09-04 ENCOUNTER — Ambulatory Visit: Payer: Medicare Other | Admitting: Hematology and Oncology

## 2021-09-04 ENCOUNTER — Other Ambulatory Visit: Payer: Medicare Other

## 2021-09-04 DIAGNOSIS — H25811 Combined forms of age-related cataract, right eye: Secondary | ICD-10-CM | POA: Diagnosis not present

## 2021-09-04 DIAGNOSIS — H25011 Cortical age-related cataract, right eye: Secondary | ICD-10-CM | POA: Diagnosis not present

## 2021-09-04 DIAGNOSIS — H2511 Age-related nuclear cataract, right eye: Secondary | ICD-10-CM | POA: Diagnosis not present

## 2021-09-07 ENCOUNTER — Other Ambulatory Visit: Payer: Self-pay

## 2021-09-07 ENCOUNTER — Inpatient Hospital Stay: Payer: Medicare Other

## 2021-09-07 ENCOUNTER — Inpatient Hospital Stay (HOSPITAL_BASED_OUTPATIENT_CLINIC_OR_DEPARTMENT_OTHER): Payer: Medicare Other | Admitting: Hematology and Oncology

## 2021-09-07 ENCOUNTER — Telehealth: Payer: Self-pay

## 2021-09-07 ENCOUNTER — Inpatient Hospital Stay: Payer: Medicare Other | Attending: Hematology and Oncology

## 2021-09-07 ENCOUNTER — Encounter: Payer: Self-pay | Admitting: Hematology and Oncology

## 2021-09-07 VITALS — BP 125/86 | HR 66 | Temp 98.7°F | Resp 18 | Ht 65.0 in | Wt 202.2 lb

## 2021-09-07 DIAGNOSIS — D509 Iron deficiency anemia, unspecified: Secondary | ICD-10-CM | POA: Insufficient documentation

## 2021-09-07 DIAGNOSIS — D519 Vitamin B12 deficiency anemia, unspecified: Secondary | ICD-10-CM | POA: Insufficient documentation

## 2021-09-07 DIAGNOSIS — Z79899 Other long term (current) drug therapy: Secondary | ICD-10-CM | POA: Insufficient documentation

## 2021-09-07 DIAGNOSIS — Z7982 Long term (current) use of aspirin: Secondary | ICD-10-CM | POA: Diagnosis not present

## 2021-09-07 DIAGNOSIS — K5900 Constipation, unspecified: Secondary | ICD-10-CM | POA: Diagnosis not present

## 2021-09-07 LAB — IRON AND TIBC
Iron: 73 ug/dL (ref 41–142)
Saturation Ratios: 24 % (ref 21–57)
TIBC: 308 ug/dL (ref 236–444)
UIBC: 234 ug/dL (ref 120–384)

## 2021-09-07 LAB — CBC WITH DIFFERENTIAL/PLATELET
Abs Immature Granulocytes: 0.01 10*3/uL (ref 0.00–0.07)
Basophils Absolute: 0.1 10*3/uL (ref 0.0–0.1)
Basophils Relative: 1 %
Eosinophils Absolute: 0.2 10*3/uL (ref 0.0–0.5)
Eosinophils Relative: 4 %
HCT: 38 % (ref 36.0–46.0)
Hemoglobin: 12.7 g/dL (ref 12.0–15.0)
Immature Granulocytes: 0 %
Lymphocytes Relative: 26 %
Lymphs Abs: 1.6 10*3/uL (ref 0.7–4.0)
MCH: 31.5 pg (ref 26.0–34.0)
MCHC: 33.4 g/dL (ref 30.0–36.0)
MCV: 94.3 fL (ref 80.0–100.0)
Monocytes Absolute: 0.4 10*3/uL (ref 0.1–1.0)
Monocytes Relative: 6 %
Neutro Abs: 3.8 10*3/uL (ref 1.7–7.7)
Neutrophils Relative %: 63 %
Platelets: 242 10*3/uL (ref 150–400)
RBC: 4.03 MIL/uL (ref 3.87–5.11)
RDW: 12.4 % (ref 11.5–15.5)
WBC: 6.1 10*3/uL (ref 4.0–10.5)
nRBC: 0 % (ref 0.0–0.2)

## 2021-09-07 LAB — FERRITIN: Ferritin: 159 ng/mL (ref 11–307)

## 2021-09-07 LAB — VITAMIN B12: Vitamin B-12: 1348 pg/mL — ABNORMAL HIGH (ref 180–914)

## 2021-09-07 NOTE — Telephone Encounter (Signed)
Called and left below message. Ask her to call the office for questions. ?

## 2021-09-07 NOTE — Telephone Encounter (Signed)
-----   Message from Heath Lark, MD sent at 09/07/2021  1:59 PM EDT ----- Pls let he rknow B12 and iron studies look good

## 2021-09-07 NOTE — Assessment & Plan Note (Signed)
She is not symptomatic at all today Her iron studies and hemoglobin is stable She does not need IV iron Plan to repeat blood work again in a few months for further follow-up

## 2021-09-07 NOTE — Progress Notes (Signed)
[place] OFFICE PROGRESS NOTE  Meredith Salvage, FNP  ASSESSMENT & PLAN:  Iron deficiency anemia She is not symptomatic at all today Her iron studies and hemoglobin is stable She does not need IV iron Plan to repeat blood work again in a few months for further follow-up  B12 deficiency anemia Repeat B12 level is adequate I recommend she continue on oral vitamin B12 supplement  Orders Placed This Encounter  Procedures   Iron and TIBC    Standing Status:   Standing    Number of Occurrences:   2    Standing Expiration Date:   09/07/2022   Ferritin    Standing Status:   Standing    Number of Occurrences:   2    Standing Expiration Date:   09/07/2022    The total time spent in the appointment was 20 minutes encounter with patients including review of chart and various tests results, discussions about plan of care and coordination of care plan   All questions were answered. The patient knows to call the clinic with any problems, questions or concerns. No barriers to learning was detected.    Heath Lark, MD 9/9/20222:01 PM  INTERVAL HISTORY: Con Meredith [name] 70 y.o. female returns for further follow-up on iron deficiency anemia She is doing well Denies recent pica Her energy level is good The patient denies any recent signs or symptoms of bleeding such as spontaneous epistaxis, hematuria or hematochezia.  SUMMARY OF HEMATOLOGIC HISTORY:  She was found to have abnormal CBC from recent blood work I have the opportunity to review her CBC dated back to June 2015 Hemoglobin ranged from 10.3-11.9 Her most recent blood work show evidence of severe iron deficiency She had EGD and colonoscopy performed in 2015 which showed no evidence of GI bleed She denies recent chest pain on exertion, but does complain of shortness of breath on minimal exertion and excessive fatigue. She had not noticed any recent bleeding such as epistaxis but did states she has occasional  hemorrhoidal bleeding once a month She had taken NSAID in the past for chronic joint pain but nothing recently.  She is on chronic aspirin therapy  She had history of abnormal PAP smear but no prior history of cancer. Her age appropriate screening programs are up-to-date. She eats a variety of diet She used to donate blood a lot in the past but nothing in the last 20 years She has pica and craves ice The patient was prescribed oral iron supplements and she takes in the past but that cause significant constipation Between December 2021 to February 2022, she received multiple doses of IV iron; she received her final dose of IV iron in May 2022  I have reviewed the past medical history, past surgical history, social history and family history with the patient and they are unchanged from previous note.  ALLERGIES:  has No Known Allergies.  MEDICATIONS:  Current Outpatient Medications  Medication Sig Dispense Refill   ALPRAZolam (XANAX) 0.5 MG tablet Take 1 tablet (0.5 mg total) by mouth 2 (two) times daily as needed for anxiety. 60 tablet 0   amLODipine (NORVASC) 2.5 MG tablet TAKE 1 TABLET(2.5 MG) BY MOUTH DAILY 90 tablet 3   aspirin 81 MG tablet Take 81 mg by mouth daily.     Biotin 1000 MCG tablet      cetirizine (ZYRTEC ALLERGY) 10 MG tablet Take 1 tablet (10 mg total) by mouth daily. 30 tablet 0   escitalopram (LEXAPRO)  20 MG tablet Take 1 tablet (20 mg total) by mouth daily. 90 tablet 3   folic acid (FOLVITE) A999333 MCG tablet Take 400 mcg by mouth daily.     furosemide (LASIX) 20 MG tablet Take 1 tablet (20 mg total) by mouth 2 (two) times daily. 180 tablet 3   irbesartan (AVAPRO) 300 MG tablet Take 1 tablet (300 mg total) by mouth daily. 90 tablet 3   linagliptin (TRADJENTA) 5 MG TABS tablet Take 1 tablet (5 mg total) by mouth daily. 90 tablet 3   metFORMIN (GLUCOPHAGE) 1000 MG tablet TAKE 1 TABLET BY MOUTH TWICE DAILY WITH A MEAL 180 tablet 3   pantoprazole (PROTONIX) 40 MG tablet TAKE  1 TABLET BY MOUTH EVERY DAY 90 tablet 0   rosuvastatin (CRESTOR) 10 MG tablet TAKE 1 TABLET BY MOUTH EVERY DAY 90 tablet 3   Turmeric 500 MG CAPS      vitamin B-12 (CYANOCOBALAMIN) 1000 MCG tablet Take 1,000 mcg by mouth daily.     Current Facility-Administered Medications  Medication Dose Route Frequency Provider Last Rate Last Admin   0.9 %  sodium chloride infusion  500 mL Intravenous Once Nandigam, Venia Minks, MD         REVIEW OF SYSTEMS:   Constitutional: Denies fevers, chills or night sweats Eyes: Denies blurriness of vision Ears, nose, mouth, throat, and face: Denies mucositis or sore throat Respiratory: Denies cough, dyspnea or wheezes Cardiovascular: Denies palpitation, chest discomfort or lower extremity swelling Gastrointestinal:  Denies nausea, heartburn or change in bowel habits Skin: Denies abnormal skin rashes Lymphatics: Denies new lymphadenopathy or easy bruising Neurological:Denies numbness, tingling or new weaknesses Behavioral/Psych: Mood is stable, no new changes  All other systems were reviewed with the patient and are negative.  PHYSICAL EXAMINATION: ECOG PERFORMANCE STATUS: 0 - Asymptomatic  Vitals:   09/07/21 1145  BP: 125/86  Pulse: 66  Resp: 18  Temp: 98.7 F (37.1 C)  SpO2: 99%   Filed Weights   09/07/21 1145  Weight: 202 lb 3.2 oz (91.7 kg)    GENERAL:alert, no distress and comfortable NEURO: alert & oriented x 3 with fluent speech, no focal motor/sensory deficits  LABORATORY DATA:  I have reviewed the data as listed     Component Value Date/Time   NA 138 10/31/2020 1019   K 4.1 10/31/2020 1019   CL 103 10/31/2020 1019   CO2 26 10/31/2020 1019   GLUCOSE 165 (H) 10/31/2020 1019   BUN 12 10/31/2020 1019   CREATININE 0.72 10/31/2020 1019   CALCIUM 9.2 10/31/2020 1019   PROT 7.1 10/31/2020 1019   ALBUMIN 3.9 10/31/2020 1019   AST 16 10/31/2020 1019   ALT 11 10/31/2020 1019   ALKPHOS 71 10/31/2020 1019   BILITOT 0.4 10/31/2020 1019    GFRNONAA 79.93 07/12/2010 1707    No results found for: SPEP, UPEP  Lab Results  Component Value Date   WBC 6.1 09/07/2021   NEUTROABS 3.8 09/07/2021   HGB 12.7 09/07/2021   HCT 38.0 09/07/2021   MCV 94.3 09/07/2021   PLT 242 09/07/2021      Chemistry      Component Value Date/Time   NA 138 10/31/2020 1019   K 4.1 10/31/2020 1019   CL 103 10/31/2020 1019   CO2 26 10/31/2020 1019   BUN 12 10/31/2020 1019   CREATININE 0.72 10/31/2020 1019      Component Value Date/Time   CALCIUM 9.2 10/31/2020 1019   ALKPHOS 71 10/31/2020 1019  AST 16 10/31/2020 1019   ALT 11 10/31/2020 1019   BILITOT 0.4 10/31/2020 1019

## 2021-09-07 NOTE — Assessment & Plan Note (Signed)
Repeat B12 level is adequate I recommend she continue on oral vitamin B12 supplement 

## 2021-09-10 ENCOUNTER — Encounter: Payer: Self-pay | Admitting: Family

## 2021-09-18 NOTE — Progress Notes (Deleted)
GYNECOLOGY  VISIT   HPI: 70 y.o.   Divorced  Caucasian  female   [phone] with Patient's last menstrual period was 12/30/1998 (approximate).   here for breast and pelvic exam.   GYNECOLOGIC HISTORY: Patient's last menstrual period was 12/30/1998 (approximate). Contraception:  PMP Menopausal hormone therapy:  *** Last mammogram: 02-25-20 3D/Neg/Birads1 Last pap smear:  02-22-19 Neg:Neg HR HPV, 02-11-18 Neg:Neg HR HPV, 05-14-17 Neg:Neg HR HPV.  Hx poss.DES exposure.  Pap 08/28/15 showed normal cells and positive HR HPV. Status post colposcopy - LGSIL 09/20/15.  Colposcopy unsatisfactory.   Inflammation of vagina noted after procedure on 09/27/15.  Dr. Denman George consultation on 10/16/15 for cervix flush with vagina and unsatisfactory colpo. Pap and HR HPV recommended in one year due to concordance of pap and colpo bx.  Recommended hysterectomy only if needed more clear diagnosis or work up for HGSIL pap or biopsy.  LEEP and conization not recommended due to cervix flush with cervix. History of possible DES exposure noted at that visit.           OB History     Gravida  3   Para  3   Term  3   Preterm  0   AB  0   Living  4      SAB  0   IAB  0   Ectopic  0   Multiple  1   Live Births  0              Patient Active Problem List   Diagnosis Date Noted   Iron deficiency anemia 11/21/2020   Aortic atherosclerosis ([place]) 06/05/2020   Chest pain of uncertain etiology 17/61/6073   Educated about COVID-19 virus infection 01/10/2020   Bilateral primary osteoarthritis of knee 03/19/2019   B12 deficiency anemia    Other emphysema ([place]) 02/23/2014   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[place]) 12/31/2013   Essential hypertension, benign 11/05/2013   Routine health maintenance 09/27/2012   Vitamin D deficiency 06/20/2011   OBESITY, CLASS II 03/18/2011   PULMONARY NODULE 07/12/2010   Diabetes mellitus type 2, controlled, without complications ([place]) 71/05/2693   Hyperlipidemia  10/01/2007   ANXIETY 10/01/2007   Depression 10/01/2007   OSA (obstructive sleep apnea) 10/01/2007    Past Medical History:  Diagnosis Date   Anemia    Anxiety state, unspecified    Arthritis    KNEES   B12 deficiency anemia 05/2014 dx   CIN I (cervical intraepithelial neoplasia I)    Depressive disorder, not elsewhere classified    Diabetes mellitus without complication ([place])    Emphysema of lung ([place])    Hypertension    Internal hemorrhoids    Other and unspecified hyperlipidemia    Unspecified sleep apnea     Past Surgical History:  Procedure Laterality Date   CERVICAL CERCLAGE     CESAREAN SECTION     COLONOSCOPY     HEMORRHOID SURGERY     KNEE ARTHROSCOPY Left 12/2014   UPPER GASTROINTESTINAL ENDOSCOPY      Current Outpatient Medications  Medication Sig Dispense Refill   ALPRAZolam (XANAX) 0.5 MG tablet Take 1 tablet (0.5 mg total) by mouth 2 (two) times daily as needed for anxiety. 60 tablet 0   amLODipine (NORVASC) 2.5 MG tablet TAKE 1 TABLET(2.5 MG) BY MOUTH DAILY 90 tablet 3   aspirin 81 MG tablet Take 81 mg by mouth daily.     Biotin 1000 MCG tablet      cetirizine (ZYRTEC ALLERGY) 10  MG tablet Take 1 tablet (10 mg total) by mouth daily. 30 tablet 0   escitalopram (LEXAPRO) 20 MG tablet Take 1 tablet (20 mg total) by mouth daily. 90 tablet 3   folic acid (FOLVITE) 409 MCG tablet Take 400 mcg by mouth daily.     furosemide (LASIX) 20 MG tablet Take 1 tablet (20 mg total) by mouth 2 (two) times daily. 180 tablet 3   irbesartan (AVAPRO) 300 MG tablet Take 1 tablet (300 mg total) by mouth daily. 90 tablet 3   linagliptin (TRADJENTA) 5 MG TABS tablet Take 1 tablet (5 mg total) by mouth daily. 90 tablet 3   metFORMIN (GLUCOPHAGE) 1000 MG tablet TAKE 1 TABLET BY MOUTH TWICE DAILY WITH A MEAL 180 tablet 3   pantoprazole (PROTONIX) 40 MG tablet TAKE 1 TABLET BY MOUTH EVERY DAY 90 tablet 0   rosuvastatin (CRESTOR) 10 MG tablet TAKE 1 TABLET BY MOUTH EVERY DAY 90 tablet 3    Turmeric 500 MG CAPS      vitamin B-12 (CYANOCOBALAMIN) 1000 MCG tablet Take 1,000 mcg by mouth daily.     Current Facility-Administered Medications  Medication Dose Route Frequency Provider Last Rate Last Admin   0.9 %  sodium chloride infusion  500 mL Intravenous Once Nandigam, Venia Minks, MD         ALLERGIES: Patient has no known allergies.  Family History  Problem Relation Age of Onset   Heart attack Father 61   Diabetes Father    Cancer Father        lung   Heart disease Father    Multiple sclerosis Mother    Breast cancer Neg Hx    Colon cancer Neg Hx    Esophageal cancer Neg Hx    Pancreatic cancer Neg Hx     Social History   Socioeconomic History   Marital status: Divorced    Spouse name: Not on file   Number of children: 3   Years of education: 14   Highest education level: Not on file  Occupational History   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  Tobacco Use   Smoking status: Former    Packs/day: 1.00    Years: 30.00    Pack years: 30.00    Types: Cigarettes   Smokeless tobacco: Never  Vaping Use   Vaping Use: Never used  Substance and Sexual Activity   Alcohol use: Yes    Alcohol/week: 6.0 standard drinks    Types: 6 Glasses of wine per week    Comment: 6 glasses or wine each week   Drug use: No   Sexual activity: Never    Birth control/protection: Post-menopausal  Other Topics Concern   Not on file  Social History Narrative   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 BA- psych from [employer]. Married - 14 years -divorced. 3 daughters - twins 38 - one dtr,Charlotte, died 2012-03-29 unknown causes, 45   Work: Biomedical engineer substance abuse Social worker. No h/o physical or sexual abuse   Social Determinants of Radio broadcast assistant Strain: Not on file  Food Insecurity: Not on file  Transportation Needs: Not on file  Physical Activity: Not on file  Stress: Not on file  Social  Connections: Not on file  Intimate Partner Violence: Not on file    Review of Systems  PHYSICAL EXAMINATION:    LMP 12/30/1998 (Approximate)     General appearance: alert, cooperative and appears stated  age Head: Normocephalic, without obvious abnormality, atraumatic Neck: no adenopathy, supple, symmetrical, trachea midline and thyroid normal to inspection and palpation Lungs: clear to auscultation bilaterally Breasts: normal appearance, no masses or tenderness, No nipple retraction or dimpling, No nipple discharge or bleeding, No axillary or supraclavicular adenopathy Heart: regular rate and rhythm Abdomen: soft, non-tender, no masses,  no organomegaly Extremities: extremities normal, atraumatic, no cyanosis or edema Skin: Skin color, texture, turgor normal. No rashes or lesions Lymph nodes: Cervical, supraclavicular, and axillary nodes normal. No abnormal inguinal nodes palpated Neurologic: Grossly normal  Pelvic: External genitalia:  no lesions              Urethra:  normal appearing urethra with no masses, tenderness or lesions              Bartholins and Skenes: normal                 Vagina: normal appearing vagina with normal color and discharge, no lesions              Cervix: no lesions                Bimanual Exam:  Uterus:  normal size, contour, position, consistency, mobility, non-tender              Adnexa: no mass, fullness, tenderness              Rectal exam: {yes no:[id number]}.  Confirms.              Anus:  normal sphincter tone, no lesions  Chaperone was present for exam:  ***  ASSESSMENT     PLAN     An After Visit Summary was printed and given to the patient.  ______ minutes face to face time of which over 50% was spent in counseling.

## 2021-09-19 ENCOUNTER — Ambulatory Visit: Payer: Medicare Other | Admitting: Obstetrics and Gynecology

## 2021-09-20 NOTE — Progress Notes (Signed)
70 y.o. G49P3004 Divorced Caucasian female here for annual exam.    Has done iron infusions for anemia.   PCP:   Jodi Mourning, FNP.  Patient's last menstrual period was 12/30/1998 (approximate).           Sexually active: No.  The current method of family planning is none.    Exercising: Yes.    Home exercise.  Smoker:  Former  Health Maintenance: Pap:  02-22-19 normal, neg HRHPV History of abnormal Pap:  yes Colpo 2018 Granulation tissue MMG:  02-25-20 normal.  Has appointment.  Colonoscopy:  2022 Sessile polyp removed.  Endoscopy done:   BMD:   09-04-18  Result  Osteopenia TDaP:  2013 Gardasil:   no HIV: Hep C:2017 neg Screening Labs:  PCP   reports that she has quit smoking. Her smoking use included cigarettes. She has a 30.00 pack-year smoking history. She has never used smokeless tobacco. She reports current alcohol use of about 6.0 standard drinks per week. She reports that she does not use drugs.  Past Medical History:  Diagnosis Date   Anemia    Anxiety state, unspecified    Arthritis    KNEES   B12 deficiency anemia 05/2014 dx   CIN I (cervical intraepithelial neoplasia I)    Depressive disorder, not elsewhere classified    Diabetes mellitus without complication (HCC)    Emphysema of lung ([place])    Hypertension    Internal hemorrhoids    Other and unspecified hyperlipidemia    Stomach ulcer    Unspecified sleep apnea     Past Surgical History:  Procedure Laterality Date   cataract surg Right    CERVICAL CERCLAGE     CESAREAN SECTION     COLONOSCOPY     HEMORRHOID SURGERY     KNEE ARTHROSCOPY Left 12/2014   UPPER GASTROINTESTINAL ENDOSCOPY      Current Outpatient Medications  Medication Sig Dispense Refill   amLODipine (NORVASC) 2.5 MG tablet TAKE 1 TABLET(2.5 MG) BY MOUTH DAILY 90 tablet 0   aspirin 81 MG tablet Take 81 mg by mouth daily.     Biotin 1000 MCG tablet      escitalopram (LEXAPRO) 20 MG tablet Take 1 tablet (20 mg total) by mouth daily. 90  tablet 3   folic acid (FOLVITE) 229 MCG tablet Take 400 mcg by mouth daily.     furosemide (LASIX) 20 MG tablet Take 1 tablet (20 mg total) by mouth 2 (two) times daily. 180 tablet 3   irbesartan (AVAPRO) 300 MG tablet Take 1 tablet (300 mg total) by mouth daily. 90 tablet 3   linagliptin (TRADJENTA) 5 MG TABS tablet Take 1 tablet (5 mg total) by mouth daily. 90 tablet 3   metFORMIN (GLUCOPHAGE) 1000 MG tablet TAKE 1 TABLET BY MOUTH TWICE DAILY WITH A MEAL 180 tablet 3   rosuvastatin (CRESTOR) 10 MG tablet TAKE 1 TABLET BY MOUTH EVERY DAY 90 tablet 3   vitamin B-12 (CYANOCOBALAMIN) 1000 MCG tablet Take 1,000 mcg by mouth daily.     ALPRAZolam (XANAX) 0.5 MG tablet Take 1 tablet (0.5 mg total) by mouth 2 (two) times daily as needed for anxiety. (Patient not taking: Reported on 09/21/2021) 60 tablet 0   Current Facility-Administered Medications  Medication Dose Route Frequency Provider Last Rate Last Admin   0.9 %  sodium chloride infusion  500 mL Intravenous Once Nandigam, Venia Minks, MD        Family History  Problem Relation Age of Onset  Heart attack Father 80   Diabetes Father    Cancer Father        lung   Heart disease Father    Multiple sclerosis Mother    Breast cancer Neg Hx    Colon cancer Neg Hx    Esophageal cancer Neg Hx    Pancreatic cancer Neg Hx     Review of Systems  See HPI.   Exam:   BP 120/78 (Cuff Size: Large)   Pulse 78   Ht 5\' 4"  (1.626 m)   Wt 201 lb (91.2 kg)   LMP 12/30/1998 (Approximate)   SpO2 98%   BMI 34.50 kg/m     General appearance: alert, cooperative and appears stated age Head: normocephalic, without obvious abnormality, atraumatic Neck: no adenopathy, supple, symmetrical, trachea midline and thyroid normal to inspection and palpation Lungs: clear to auscultation bilaterally Breasts: normal appearance, no masses or tenderness, No nipple retraction or dimpling, No nipple discharge or bleeding, No axillary adenopathy Heart: regular rate and  rhythm Abdomen: soft, non-tender; no masses, no organomegaly Extremities: extremities normal, atraumatic, no cyanosis or edema Skin: skin color, texture, turgor normal. No rashes or lesions Lymph nodes: cervical, supraclavicular, and axillary nodes normal. Neurologic: grossly normal  Pelvic: External genitalia:  no lesions              No abnormal inguinal nodes palpated.              Urethra:  normal appearing urethra with no masses, tenderness or lesions              Bartholins and Skenes: normal                 Vagina: normal appearing vagina with normal color and discharge, no lesions              Cervix: no lesions.  Flush with cervix.  Atrophy noted of cervix and vagina.              Pap taken: yes. Bimanual Exam:  Uterus:  normal size, contour, position, consistency, mobility, non-tender              Adnexa: no mass, fullness, tenderness              Rectal exam: declined.  Chaperone was present for exam:  kim G., CMA  Assessment:   Well woman visit with gynecologic exam. Hx cervical lesion.  Atypia on colpo biopsy in [place]. 2018. Hx prior LGSIL on colposcopy 2016.  Had positive HR HPV then. Hx potential DES exposure.  Hx preterm deliveries.  Cervix flush with vagina. Osteopenia.   Plan: Mammogram screening discussed. Self breast awareness reviewed. Pap and HR HPV collected. Yearly pap recommended due to potential DES exposure.  Guidelines for Calcium, Vitamin D, regular exercise program including cardiovascular and weight bearing exercise. She will pursue a bone density at [facility] or [place].  Follow up annually and prn.    After visit summary provided.   37 min  total time was spent for this patient encounter, including preparation, face-to-face counseling with the patient, coordination of care, and documentation of the encounter.

## 2021-09-21 ENCOUNTER — Encounter: Payer: Self-pay | Admitting: Obstetrics and Gynecology

## 2021-09-21 ENCOUNTER — Ambulatory Visit (INDEPENDENT_AMBULATORY_CARE_PROVIDER_SITE_OTHER): Payer: Medicare Other | Admitting: Obstetrics and Gynecology

## 2021-09-21 ENCOUNTER — Other Ambulatory Visit: Payer: Self-pay

## 2021-09-21 ENCOUNTER — Other Ambulatory Visit: Payer: Self-pay | Admitting: Family

## 2021-09-21 ENCOUNTER — Other Ambulatory Visit (HOSPITAL_COMMUNITY)
Admission: RE | Admit: 2021-09-21 | Discharge: 2021-09-21 | Disposition: A | Discharge status: Home / Self Care | Payer: Medicare Other | Source: Ambulatory Visit | Attending: Obstetrics and Gynecology | Admitting: Obstetrics and Gynecology

## 2021-09-21 VITALS — BP 120/78 | HR 78 | Ht 64.0 in | Wt 201.0 lb

## 2021-09-21 DIAGNOSIS — Z124 Encounter for screening for malignant neoplasm of cervix: Secondary | ICD-10-CM | POA: Insufficient documentation

## 2021-09-21 DIAGNOSIS — B977 Papillomavirus as the cause of diseases classified elsewhere: Secondary | ICD-10-CM | POA: Diagnosis not present

## 2021-09-21 DIAGNOSIS — Z87410 Personal history of cervical dysplasia: Secondary | ICD-10-CM | POA: Diagnosis not present

## 2021-09-21 DIAGNOSIS — Z01419 Encounter for gynecological examination (general) (routine) without abnormal findings: Secondary | ICD-10-CM

## 2021-09-21 DIAGNOSIS — Z8619 Personal history of other infectious and parasitic diseases: Secondary | ICD-10-CM | POA: Diagnosis not present

## 2021-09-21 NOTE — Patient Instructions (Signed)

## 2021-09-24 LAB — CYTOLOGY - PAP
Comment: NEGATIVE
Diagnosis: NEGATIVE
High risk HPV: NEGATIVE

## 2021-09-25 ENCOUNTER — Other Ambulatory Visit: Payer: Self-pay

## 2021-09-25 ENCOUNTER — Ambulatory Visit
Admission: RE | Admit: 2021-09-25 | Discharge: 2021-09-25 | Disposition: A | Discharge status: Home / Self Care | Payer: Medicare Other | Source: Ambulatory Visit | Attending: Family | Admitting: Family

## 2021-09-25 DIAGNOSIS — Z1231 Encounter for screening mammogram for malignant neoplasm of breast: Secondary | ICD-10-CM | POA: Diagnosis not present

## 2021-10-15 ENCOUNTER — Other Ambulatory Visit: Payer: Self-pay | Admitting: Family

## 2021-10-15 DIAGNOSIS — I10 Essential (primary) hypertension: Secondary | ICD-10-CM

## 2021-10-17 ENCOUNTER — Other Ambulatory Visit: Payer: Self-pay | Admitting: Family

## 2021-10-30 ENCOUNTER — Other Ambulatory Visit: Payer: Self-pay | Admitting: Family

## 2021-11-02 ENCOUNTER — Encounter: Payer: Self-pay | Admitting: Family

## 2021-11-02 ENCOUNTER — Ambulatory Visit (INDEPENDENT_AMBULATORY_CARE_PROVIDER_SITE_OTHER): Payer: Medicare Other | Admitting: Family

## 2021-11-02 ENCOUNTER — Other Ambulatory Visit: Payer: Self-pay

## 2021-11-02 VITALS — BP 138/70 | HR 74 | Temp 97.8°F | Ht 64.0 in | Wt 207.4 lb

## 2021-11-02 DIAGNOSIS — D509 Iron deficiency anemia, unspecified: Secondary | ICD-10-CM

## 2021-11-02 DIAGNOSIS — I10 Essential (primary) hypertension: Secondary | ICD-10-CM

## 2021-11-02 DIAGNOSIS — E782 Mixed hyperlipidemia: Secondary | ICD-10-CM | POA: Diagnosis not present

## 2021-11-02 DIAGNOSIS — E119 Type 2 diabetes mellitus without complications: Secondary | ICD-10-CM | POA: Diagnosis not present

## 2021-11-02 DIAGNOSIS — J029 Acute pharyngitis, unspecified: Secondary | ICD-10-CM | POA: Diagnosis not present

## 2021-11-02 DIAGNOSIS — Z87891 Personal history of nicotine dependence: Secondary | ICD-10-CM | POA: Diagnosis not present

## 2021-11-02 LAB — POCT INFLUENZA A/B
Influenza A, POC: NEGATIVE
Influenza B, POC: NEGATIVE

## 2021-11-02 MED ORDER — OSELTAMIVIR PHOSPHATE 75 MG PO CAPS: 75.0000 mg | ORAL_CAPSULE | Freq: Every day | ORAL | 0 refills | Status: DC

## 2021-11-02 NOTE — Progress Notes (Signed)
Meredith [name] is a 70 y.o. female with the following history as recorded in EpicCare:  Patient Active Problem List   Diagnosis Date Noted   Iron deficiency anemia 11/21/2020   Aortic atherosclerosis ([place]) 06/05/2020   Chest pain of uncertain etiology 40/07/6760   Educated about COVID-19 virus infection 01/10/2020   Bilateral primary osteoarthritis of knee 03/19/2019   B12 deficiency anemia    Other emphysema ([place]) 02/23/2014   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[place]) 12/31/2013   Essential hypertension, benign 11/05/2013   Routine health maintenance 09/27/2012   Vitamin D deficiency 06/20/2011   OBESITY, CLASS II 03/18/2011   PULMONARY NODULE 07/12/2010   Diabetes mellitus type 2, controlled, without complications ([place]) 95/08/3266   Hyperlipidemia 10/01/2007   ANXIETY 10/01/2007   Depression 10/01/2007   OSA (obstructive sleep apnea) 10/01/2007    Current Outpatient Medications  Medication Sig Dispense Refill   ALPRAZolam (XANAX) 0.5 MG tablet Take 1 tablet (0.5 mg total) by mouth 2 (two) times daily as needed for anxiety. 60 tablet 0   amLODipine (NORVASC) 2.5 MG tablet TAKE 1 TABLET(2.5 MG) BY MOUTH DAILY 90 tablet 0   aspirin 81 MG tablet Take 81 mg by mouth daily.     escitalopram (LEXAPRO) 20 MG tablet Take 1 tablet (20 mg total) by mouth daily. 90 tablet 3   furosemide (LASIX) 20 MG tablet TAKE 1 TABLET BY MOUTH TWICE A DAY 180 tablet 0   irbesartan (AVAPRO) 300 MG tablet TAKE 1 TABLET BY MOUTH EVERY DAY 90 tablet 0   linagliptin (TRADJENTA) 5 MG TABS tablet Take 1 tablet (5 mg total) by mouth daily. 90 tablet 3   metFORMIN (GLUCOPHAGE) 1000 MG tablet TAKE 1 TABLET BY MOUTH TWICE DAILY WITH A MEAL 180 tablet 0   oseltamivir (TAMIFLU) 75 MG capsule Take 1 capsule (75 mg total) by mouth daily. 10 capsule 0   rosuvastatin (CRESTOR) 10 MG tablet TAKE 1 TABLET BY MOUTH EVERY DAY 90 tablet 3   vitamin B-12 (CYANOCOBALAMIN) 1000 MCG tablet Take 1,000 mcg by mouth daily.     No  current facility-administered medications for this visit.    Allergies: Patient has no known allergies.  Past Medical History:  Diagnosis Date   Anemia    Anxiety state, unspecified    Arthritis    KNEES   B12 deficiency anemia 05/2014 dx   CIN I (cervical intraepithelial neoplasia I)    Depressive disorder, not elsewhere classified    Diabetes mellitus without complication ([place])    Emphysema of lung ([place])    Hypertension    Internal hemorrhoids    Other and unspecified hyperlipidemia    Stomach ulcer    Unspecified sleep apnea     Past Surgical History:  Procedure Laterality Date   cataract surg Right    CERVICAL CERCLAGE     CESAREAN SECTION     COLONOSCOPY     HEMORRHOID SURGERY     KNEE ARTHROSCOPY Left 12/2014   UPPER GASTROINTESTINAL ENDOSCOPY      Family History  Problem Relation Age of Onset   Heart attack Father 58   Diabetes Father    Cancer Father        lung   Heart disease Father    Multiple sclerosis Mother    Breast cancer Neg Hx    Colon cancer Neg Hx    Esophageal cancer Neg Hx    Pancreatic cancer Neg Hx     Social History   Tobacco Use  Smoking status: Former    Packs/day: 1.00    Years: 30.00    Pack years: 30.00    Types: Cigarettes   Smokeless tobacco: Never  Substance Use Topics   Alcohol use: Yes    Alcohol/week: 6.0 standard drinks    Types: 6 Glasses of wine per week    Comment: 6 glasses or wine each week    Subjective:  Presents for Diabetes follow up; no acute concerns today; Has been evaluate and treated for anemia in the past year- feeling better;  Has seen GYN and mammogram/ colonoscopy up to date;   Daughter who lives with patient has had flu this week; patient has not yet had her flu shot; patient has started to feel scratchy throat/ congestion in the past 24 hours;  Also requesting to get her lung cancer CT; last one was done in 2020; had been followed by [facility];      Objective:  Vitals:   11/02/21  1508 11/02/21 1533  BP: (!) 160/70 138/70  Pulse: 74   Temp: 97.8 F (36.6 C)   TempSrc: Oral   SpO2: 98%   Weight: 207 lb 6.4 oz (94.1 kg)   Height: 5' 4"  (1.626 m)     General: Well developed, well nourished, in no acute distress  Skin : Warm and dry.  Head: Normocephalic and atraumatic  Eyes: Sclera and conjunctiva clear; pupils round and reactive to light; extraocular movements intact  Ears: External normal; canals clear; tympanic membranes normal  Oropharynx: Pink, supple. No suspicious lesions  Neck: Supple without thyromegaly, adenopathy  Lungs: Respirations unlabored; clear to auscultation bilaterally without wheeze, rales, rhonchi  CVS exam: normal rate and regular rhythm.  Abdomen: Soft; nontender; nondistended; normoactive bowel sounds; no masses or hepatosplenomegaly  Musculoskeletal: No deformities; no active joint inflammation  Extremities: No edema, cyanosis, clubbing  Vessels: Symmetric bilaterally  Neurologic: Alert and oriented; speech intact; face symmetrical; moves all extremities well; CNII-XII intact without focal deficit   Assessment:  1. Controlled type 2 diabetes mellitus without complication, without long-term current use of insulin ([place])   2. Sore throat   3. History of tobacco abuse   4. Essential hypertension, benign   5. Mixed hyperlipidemia   6. Iron deficiency anemia, unspecified iron deficiency anemia type     Plan:  Update labs today; Rapid flu is negative- Rx for Tamiflu 75 mg 1 po qd x 10 days for prevention; she will plan to get her flu shot in the next 1-2 weeks once she is feeling better; Referral for Lung Cancer screen; Stable; continue same medications; Stable; continue same medication; Continue with hematology as scheduled;   This visit occurred during the SARS-CoV-2 public health emergency.  Safety protocols were in place, including screening questions prior to the visit, additional usage of staff PPE, and extensive cleaning of exam  room while observing appropriate contact time as indicated for disinfecting solutions.    No follow-ups on file.  Orders Placed This Encounter  Procedures   CBC with Differential/Platelet   CBC with Differential/Platelet   Comp Met (CMET)   Hemoglobin A1c   Lipid panel   Ambulatory Referral Lung Cancer Screening [facility]    Referral Priority:   Routine    Referral Type:   Consultation    Referral Reason:   Specialty Services Required    Number of Visits Requested:   1   POCT Influenza A/B    Requested Prescriptions   Signed Prescriptions Disp Refills  oseltamivir (TAMIFLU) 75 MG capsule 10 capsule 0    Sig: Take 1 capsule (75 mg total) by mouth daily.

## 2021-11-03 ENCOUNTER — Other Ambulatory Visit: Payer: Self-pay | Admitting: Family

## 2021-11-03 LAB — HEMOGLOBIN A1C
Hgb A1c MFr Bld: 6.1 % of total Hgb — ABNORMAL HIGH (ref <5.7)
Mean Plasma Glucose: 128 mg/dL
eAG (mmol/L): 7.1 mmol/L

## 2021-11-03 LAB — CBC WITH DIFFERENTIAL/PLATELET
Absolute Monocytes: 403 cells/uL (ref 200–950)
Basophils Absolute: 72 cells/uL (ref 0–200)
Basophils Relative: 1 %
Eosinophils Absolute: 274 cells/uL (ref 15–500)
Eosinophils Relative: 3.8 %
HCT: 35.2 % (ref 35.0–45.0)
Hemoglobin: 11.8 g/dL (ref 11.7–15.5)
Lymphs Abs: 1793 cells/uL (ref 850–3900)
MCH: 31.1 pg (ref 27.0–33.0)
MCHC: 33.5 g/dL (ref 32.0–36.0)
MCV: 92.9 fL (ref 80.0–100.0)
MPV: 11.1 fL (ref 7.5–12.5)
Monocytes Relative: 5.6 %
Neutro Abs: 4658 cells/uL (ref 1500–7800)
Neutrophils Relative %: 64.7 %
Platelets: 229 10*3/uL (ref 140–400)
RBC: 3.79 10*6/uL — ABNORMAL LOW (ref 3.80–5.10)
RDW: 12.2 % (ref 11.0–15.0)
Total Lymphocyte: 24.9 %
WBC: 7.2 10*3/uL (ref 3.8–10.8)

## 2021-11-03 LAB — COMPREHENSIVE METABOLIC PANEL
AG Ratio: 1.4 (calc) (ref 1.0–2.5)
ALT: 17 U/L (ref 6–29)
AST: 19 U/L (ref 10–35)
Albumin: 4 g/dL (ref 3.6–5.1)
Alkaline phosphatase (APISO): 72 U/L (ref 37–153)
BUN: 10 mg/dL (ref 7–25)
CO2: 28 mmol/L (ref 20–32)
Calcium: 9.5 mg/dL (ref 8.6–10.4)
Chloride: 103 mmol/L (ref 98–110)
Creat: 0.78 mg/dL (ref 0.60–1.00)
Globulin: 2.9 g/dL (calc) (ref 1.9–3.7)
Glucose, Bld: 114 mg/dL — ABNORMAL HIGH (ref 65–99)
Potassium: 3.8 mmol/L (ref 3.5–5.3)
Sodium: 141 mmol/L (ref 135–146)
Total Bilirubin: 0.4 mg/dL (ref 0.2–1.2)
Total Protein: 6.9 g/dL (ref 6.1–8.1)

## 2021-11-03 LAB — LIPID PANEL
Cholesterol: 178 mg/dL (ref <200)
HDL: 46 mg/dL — ABNORMAL LOW (ref >=50)
LDL Cholesterol (Calc): 104 mg/dL (calc) — ABNORMAL HIGH
Non-HDL Cholesterol (Calc): 132 mg/dL (calc) — ABNORMAL HIGH (ref <130)
Total CHOL/HDL Ratio: 3.9 (calc) (ref <5.0)
Triglycerides: 163 mg/dL — ABNORMAL HIGH (ref <150)

## 2021-11-11 DIAGNOSIS — R0609 Other forms of dyspnea: Secondary | ICD-10-CM | POA: Insufficient documentation

## 2021-11-11 NOTE — Progress Notes (Signed)
Cardiology Office Note   Date:  11/12/2021   ID:  [name], [name] [birth year], MRN [id number]  PCP:  Marrian Salvage, FNP  Cardiologist:   None Referring:  Marrian Salvage, FNP  No chief complaint on file.     History of Present Illness: Meredith [name] is a 70 y.o. female who is referred by Marrian Salvage, [place] for evaluation of dyspnea and fatigue.   We saw her greater than 3 years ago for DOE and HTN.  She had a negative Lexiscan Myoview.  POET (Plain Old Exercise Treadmill) was inconclusive.  Nuclear stress test was negative in June 2021.   She says her shortness of breath has been better since she has been getting iron infusions.  She is also lost some weight.  She exercises routinely and does not bring on any new symptoms such as chest pressure or excessive dyspnea.   Past Medical History:  Diagnosis Date   Anemia    Anxiety state, unspecified    Arthritis    KNEES   B12 deficiency anemia 05/2014 dx   CIN I (cervical intraepithelial neoplasia I)    Depressive disorder, not elsewhere classified    Diabetes mellitus without complication (HCC)    Emphysema of lung (HCC)    Hypertension    Internal hemorrhoids    Other and unspecified hyperlipidemia    Stomach ulcer    Unspecified sleep apnea     Past Surgical History:  Procedure Laterality Date   cataract surg Right    CERVICAL CERCLAGE     CESAREAN SECTION     COLONOSCOPY     HEMORRHOID SURGERY     KNEE ARTHROSCOPY Left 12/2014   UPPER GASTROINTESTINAL ENDOSCOPY       Current Outpatient Medications  Medication Sig Dispense Refill   ALPRAZolam (XANAX) 0.5 MG tablet Take 1 tablet (0.5 mg total) by mouth 2 (two) times daily as needed for anxiety. 60 tablet 0   amLODipine (NORVASC) 2.5 MG tablet TAKE 1 TABLET(2.5 MG) BY MOUTH DAILY 90 tablet 0   aspirin 81 MG tablet Take 81 mg by mouth daily.     escitalopram (LEXAPRO) 20 MG tablet Take 1 tablet (20 mg total) by mouth daily. 90 tablet 3    furosemide (LASIX) 20 MG tablet TAKE 1 TABLET BY MOUTH TWICE A DAY 180 tablet 0   irbesartan (AVAPRO) 300 MG tablet TAKE 1 TABLET BY MOUTH EVERY DAY 90 tablet 0   metFORMIN (GLUCOPHAGE) 1000 MG tablet TAKE 1 TABLET BY MOUTH TWICE DAILY WITH A MEAL 180 tablet 0   rosuvastatin (CRESTOR) 10 MG tablet TAKE 1 TABLET BY MOUTH EVERY DAY 90 tablet 3   TRADJENTA 5 MG TABS tablet TAKE 1 TABLET BY MOUTH EVERY DAY 90 tablet 3   vitamin B-12 (CYANOCOBALAMIN) 1000 MCG tablet Take 1,000 mcg by mouth daily.     No current facility-administered medications for this visit.    Allergies:   Patient has no known allergies.    ROS:  Please see the history of present illness.   Otherwise, review of systems are positive for none.   All other systems are reviewed and negative.    PHYSICAL EXAM: VS:  BP 136/74   Pulse (!) 59   Ht 5\' 4"  (1.626 m)   Wt 207 lb 6.4 oz (94.1 kg)   LMP 12/30/1998 (Approximate)   SpO2 99%   BMI 35.60 kg/m  , BMI Body mass index is 35.6 kg/m. GENERAL:  Well appearing NECK:  No jugular venous distention, waveform within normal limits, carotid upstroke brisk and symmetric, no bruits, no thyromegaly LUNGS:  Clear to auscultation bilaterally CHEST:  Unremarkable HEART:  PMI not displaced or sustained,S1 and S2 within normal limits, no S3, no S4, no clicks, no rubs, no murmurs ABD:  Flat, positive bowel sounds normal in frequency in pitch, no bruits, no rebound, no guarding, no midline pulsatile mass, no hepatomegaly, no splenomegaly EXT:  2 plus pulses throughout, no edema, no cyanosis no clubbing   EKG:  EKG is  ordered today. The ekg ordered today demonstrates sinus rhythm, rate 59, axis within normal limits, intervals within normal limits, no acute ST-T wave changes.   Recent Labs: 11/02/2021: ALT 17; BUN 10; Creat 0.78; Hemoglobin 11.8; Platelets 229; Potassium 3.8; Sodium 141    Lipid Panel    Component Value Date/Time   CHOL 178 11/02/2021 1602   TRIG 163 (H)  11/02/2021 1602   HDL 46 (L) 11/02/2021 1602   CHOLHDL 3.9 11/02/2021 1602   VLDL 32.4 10/31/2020 1019   LDLCALC 104 (H) 11/02/2021 1602   LDLDIRECT 109.0 09/04/2018 1605      Wt Readings from Last 3 Encounters:  11/12/21 207 lb 6.4 oz (94.1 kg)  11/02/21 207 lb 6.4 oz (94.1 kg)  09/21/21 201 lb (91.2 kg)      Other studies Reviewed: Additional studies/ records that were reviewed today include: Labs. Review of the above records demonstrates:  Please see elsewhere in the note.     ASSESSMENT AND PLAN:  DOE: This seems to be improving possibly related to her anemia weight and deconditioning.  No change in therapy or further testing.   HTN: Her blood pressure is controlled.  No change in therapy.   DM: A1c is 6.1 and much improved from 7.3.  Is followed closely by her primary provider.  SLEEP APNEA:   She uses her CPAP.  FATIGUE:   This seems to be slightly better with correction of her anemia.  No further work-up.  Current medicines are reviewed at length with the patient today.  The patient does not have concerns regarding medicines.  The following changes have been made:  None  Labs/ tests ordered today include: None  No orders of the defined types were placed in this encounter.    Disposition:   FU with me in about 12  months.      Signed, Minus Breeding, MD  11/12/2021 10:08 AM    [facility]

## 2021-11-12 ENCOUNTER — Ambulatory Visit (INDEPENDENT_AMBULATORY_CARE_PROVIDER_SITE_OTHER): Payer: Medicare Other | Admitting: Cardiology

## 2021-11-12 ENCOUNTER — Encounter: Payer: Self-pay | Admitting: Cardiology

## 2021-11-12 ENCOUNTER — Other Ambulatory Visit: Payer: Self-pay

## 2021-11-12 VITALS — BP 136/74 | HR 59 | Ht 64.0 in | Wt 207.4 lb

## 2021-11-12 DIAGNOSIS — I10 Essential (primary) hypertension: Secondary | ICD-10-CM

## 2021-11-12 DIAGNOSIS — R0609 Other forms of dyspnea: Secondary | ICD-10-CM | POA: Diagnosis not present

## 2021-11-12 DIAGNOSIS — G4730 Sleep apnea, unspecified: Secondary | ICD-10-CM | POA: Diagnosis not present

## 2021-11-12 DIAGNOSIS — E118 Type 2 diabetes mellitus with unspecified complications: Secondary | ICD-10-CM | POA: Diagnosis not present

## 2021-11-14 DIAGNOSIS — M542 Cervicalgia: Secondary | ICD-10-CM | POA: Diagnosis not present

## 2021-11-16 ENCOUNTER — Encounter: Payer: Self-pay | Admitting: Family

## 2021-11-17 DIAGNOSIS — Z23 Encounter for immunization: Secondary | ICD-10-CM | POA: Diagnosis not present

## 2021-11-26 ENCOUNTER — Other Ambulatory Visit: Payer: Self-pay

## 2021-11-26 ENCOUNTER — Ambulatory Visit (HOSPITAL_COMMUNITY)
Admission: EM | Admit: 2021-11-26 | Discharge: 2021-11-26 | Disposition: A | Discharge status: Home / Self Care | Payer: Medicare Other | Attending: Student | Admitting: Student

## 2021-11-26 ENCOUNTER — Encounter (HOSPITAL_COMMUNITY): Payer: Self-pay

## 2021-11-26 DIAGNOSIS — J439 Emphysema, unspecified: Secondary | ICD-10-CM | POA: Diagnosis not present

## 2021-11-26 DIAGNOSIS — R0989 Other specified symptoms and signs involving the circulatory and respiratory systems: Secondary | ICD-10-CM | POA: Diagnosis not present

## 2021-11-26 DIAGNOSIS — R509 Fever, unspecified: Secondary | ICD-10-CM | POA: Diagnosis not present

## 2021-11-26 DIAGNOSIS — M791 Myalgia, unspecified site: Secondary | ICD-10-CM | POA: Diagnosis not present

## 2021-11-26 DIAGNOSIS — R059 Cough, unspecified: Secondary | ICD-10-CM | POA: Diagnosis not present

## 2021-11-26 DIAGNOSIS — R0981 Nasal congestion: Secondary | ICD-10-CM | POA: Diagnosis not present

## 2021-11-26 DIAGNOSIS — R051 Acute cough: Secondary | ICD-10-CM | POA: Diagnosis not present

## 2021-11-26 LAB — RESPIRATORY PANEL BY PCR

## 2021-11-26 MED ORDER — ALBUTEROL SULFATE HFA 108 (90 BASE) MCG/ACT IN AERS: 1.0000 | INHALATION_SPRAY | Freq: Four times a day (QID) | RESPIRATORY_TRACT | 0 refills | Status: DC | PRN

## 2021-11-26 MED ORDER — PROMETHAZINE-DM 6.25-15 MG/5ML PO SYRP: 2.5000 mL | ORAL_SOLUTION | Freq: Four times a day (QID) | ORAL | 0 refills | Status: DC | PRN

## 2021-11-26 NOTE — ED Triage Notes (Signed)
Pt reports body aches, fever, and sore throat X 2 days.  States Saturday she woke up with a cough.

## 2021-11-26 NOTE — Discharge Instructions (Addendum)
-  Tamiflu twice daily x5 days. This medication can cause nausea, so I also sent nausea medication. You can stop the Tamiflu if you don't like it or if it causes side effects.  -Albuterol inhaler as needed for cough, wheezing, shortness of breath, 1 to 2 puffs every 6 hours as needed. -Promethazine DM cough syrup for congestion/cough. This could make you drowsy, so take at night before bed. -For fevers/chills, bodyaches, headaches- You can take Tylenol up to 1000 mg 3 times daily, and ibuprofen up to 600 mg 3 times daily with food.  You can take these together, or alternate every 3-4 hours. -Drink plenty of water/gatorade and get plenty of rest -With a virus, you're typically contagious for 5-7 days, or as long as you're having fevers.  -Come back and see [place] if things are getting worse instead of better, like shortness of breath, chest pain, fevers and chills that are getting higher instead of lower and do not come down with Tylenol or ibuprofen, etc.

## 2021-11-26 NOTE — ED Provider Notes (Signed)
[facility]    CSN: [id number] Arrival date & time: 11/26/21  1545      History   Chief Complaint Chief Complaint  Patient presents with   Fever   Cough    HPI Meredith [name] is a 70 y.o. female presenting with flulike symptoms for 2 days following spending time with her grandchildren.  Medical history emphysema of lung, states that she has not had issues with this since her 48s.  Describes generalized body aches, fevers as high as 101.2, sore throat, nasal congestion, decreased appetite.  Denies nausea, vomiting, diarrhea.  States that she had her flu shot and her COVID booster about 3 weeks ago, was also tested for the flu about 3 weeks ago but not recently.  Does not have an inhaler at home.  HPI  Past Medical History:  Diagnosis Date   Anemia    Anxiety state, unspecified    Arthritis    KNEES   B12 deficiency anemia 05/2014 dx   CIN I (cervical intraepithelial neoplasia I)    Depressive disorder, not elsewhere classified    Diabetes mellitus without complication ([place])    Emphysema of lung ([place])    Hypertension    Internal hemorrhoids    Other and unspecified hyperlipidemia    Stomach ulcer    Unspecified sleep apnea     Patient Active Problem List   Diagnosis Date Noted   DOE (dyspnea on exertion) 11/11/2021   Iron deficiency anemia 11/21/2020   Aortic atherosclerosis ([place]) 06/05/2020   Chest pain of uncertain etiology 62/83/1517   Educated about COVID-19 virus infection 01/10/2020   Bilateral primary osteoarthritis of knee 03/19/2019   B12 deficiency anemia    Other emphysema ([place]) 02/23/2014   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[place]) 12/31/2013   Essential hypertension, benign 11/05/2013   Routine health maintenance 09/27/2012   Vitamin D deficiency 06/20/2011   OBESITY, CLASS II 03/18/2011   PULMONARY NODULE 07/12/2010   Diabetes mellitus type 2, controlled, without complications ([place]) 61/60/7371   Hyperlipidemia 10/01/2007   ANXIETY  10/01/2007   Depression 10/01/2007   OSA (obstructive sleep apnea) 10/01/2007    Past Surgical History:  Procedure Laterality Date   cataract surg Right    CERVICAL CERCLAGE     CESAREAN SECTION     COLONOSCOPY     HEMORRHOID SURGERY     KNEE ARTHROSCOPY Left 12/2014   UPPER GASTROINTESTINAL ENDOSCOPY      OB History     Gravida  3   Para  3   Term  3   Preterm  0   AB  0   Living  2      SAB  0   IAB  0   Ectopic  0   Multiple  1   Live Births  0            Home Medications    Prior to Admission medications   Medication Sig Start Date End Date Taking? Authorizing Provider  albuterol (VENTOLIN HFA) 108 (90 Base) MCG/ACT inhaler Inhale 1-2 puffs into the lungs every 6 (six) hours as needed for wheezing or shortness of breath. 11/26/21  Yes Hazel Sams, PA-C  promethazine-dextromethorphan (PROMETHAZINE-DM) 6.25-15 MG/5ML syrup Take 2.5 mLs by mouth 4 (four) times daily as needed for cough. 11/26/21  Yes Hazel Sams, PA-C  ALPRAZolam Duanne Moron) 0.5 MG tablet Take 1 tablet (0.5 mg total) by mouth 2 (two) times daily as needed for anxiety. 10/31/20   Jodi Mourning  Jasmine December, FNP  amLODipine (NORVASC) 2.5 MG tablet TAKE 1 TABLET(2.5 MG) BY MOUTH DAILY 09/21/21   Marrian Salvage, FNP  aspirin 81 MG tablet Take 81 mg by mouth daily.    [provider]  escitalopram (LEXAPRO) 20 MG tablet Take 1 tablet (20 mg total) by mouth daily. 10/31/20   Marrian Salvage, FNP  furosemide (LASIX) 20 MG tablet TAKE 1 TABLET BY MOUTH TWICE A DAY 10/16/21   Marrian Salvage, FNP  irbesartan (AVAPRO) 300 MG tablet TAKE 1 TABLET BY MOUTH EVERY DAY 10/30/21   Marrian Salvage, FNP  metFORMIN (GLUCOPHAGE) 1000 MG tablet TAKE 1 TABLET BY MOUTH TWICE DAILY WITH A MEAL 10/18/21   Marrian Salvage, FNP  rosuvastatin (CRESTOR) 10 MG tablet TAKE 1 TABLET BY MOUTH EVERY DAY 10/31/20   Marrian Salvage, FNP  TRADJENTA 5 MG TABS tablet TAKE 1  TABLET BY MOUTH EVERY DAY 11/05/21   Marrian Salvage, FNP  vitamin B-12 (CYANOCOBALAMIN) 1000 MCG tablet Take 1,000 mcg by mouth daily.    [provider]    Family History Family History  Problem Relation Age of Onset   Heart attack Father 47   Diabetes Father    Cancer Father        lung   Heart disease Father    Multiple sclerosis Mother    Breast cancer Neg Hx    Colon cancer Neg Hx    Esophageal cancer Neg Hx    Pancreatic cancer Neg Hx     Social History Social History   Tobacco Use   Smoking status: Former    Packs/day: 1.00    Years: 30.00    Pack years: 30.00    Types: Cigarettes   Smokeless tobacco: Never  Vaping Use   Vaping Use: Never used  Substance Use Topics   Alcohol use: Yes    Alcohol/week: 6.0 standard drinks    Types: 6 Glasses of wine per week    Comment: 6 glasses or wine each week   Drug use: No     Allergies   Patient has no known allergies.   Review of Systems Review of Systems  Constitutional:  Positive for chills and fever. Negative for appetite change.  HENT:  Positive for congestion. Negative for ear pain, rhinorrhea, sinus pressure, sinus pain and sore throat.   Eyes:  Negative for redness and visual disturbance.  Respiratory:  Positive for cough. Negative for chest tightness, shortness of breath and wheezing.   Cardiovascular:  Negative for chest pain and palpitations.  Gastrointestinal:  Negative for abdominal pain, constipation, diarrhea, nausea and vomiting.  Genitourinary:  Negative for dysuria, frequency and urgency.  Musculoskeletal:  Positive for myalgias.  Neurological:  Negative for dizziness, weakness and headaches.  Psychiatric/Behavioral:  Negative for confusion.   All other systems reviewed and are negative.   Physical Exam Triage Vital Signs ED Triage Vitals  Enc Vitals Group     BP 11/26/21 1730 (!) 107/96     Pulse Rate 11/26/21 1729 95     Resp 11/26/21 1729 17     Temp 11/26/21 1729 98.1  F (36.7 C)     Temp Source 11/26/21 1729 Oral     SpO2 11/26/21 1729 98 %     Weight --      Height --      Head Circumference --      Peak Flow --      Pain Score 11/26/21 1728 4  Pain Loc --      Pain Edu? --      Excl. in [place]? --    No data found.  Updated Vital Signs BP (!) 107/96 (BP Location: Right Arm)   Pulse 95   Temp 98.1 F (36.7 C) (Oral)   Resp 17   LMP 12/30/1998 (Approximate)   SpO2 98%   Visual Acuity Right Eye Distance:   Left Eye Distance:   Bilateral Distance:    Right Eye Near:   Left Eye Near:    Bilateral Near:     Physical Exam Vitals reviewed.  Constitutional:      General: She is not in acute distress.    Appearance: Normal appearance. She is not ill-appearing.  HENT:     Head: Normocephalic and atraumatic.     Right Ear: Tympanic membrane, ear canal and external ear normal. No tenderness. No middle ear effusion. There is no impacted cerumen. Tympanic membrane is not perforated, erythematous, retracted or bulging.     Left Ear: Tympanic membrane, ear canal and external ear normal. No tenderness.  No middle ear effusion. There is no impacted cerumen. Tympanic membrane is not perforated, erythematous, retracted or bulging.     Nose: Nose normal. No congestion.     Mouth/Throat:     Mouth: Mucous membranes are moist.     Pharynx: Uvula midline. No oropharyngeal exudate or posterior oropharyngeal erythema.  Eyes:     Extraocular Movements: Extraocular movements intact.     Pupils: Pupils are equal, round, and reactive to light.  Cardiovascular:     Rate and Rhythm: Normal rate and regular rhythm.     Heart sounds: Normal heart sounds.  Pulmonary:     Effort: Pulmonary effort is normal.     Breath sounds: Normal breath sounds. No decreased breath sounds, wheezing, rhonchi or rales.  Abdominal:     Palpations: Abdomen is soft.     Tenderness: There is no abdominal tenderness. There is no guarding or rebound.  Lymphadenopathy:      Cervical: No cervical adenopathy.     Right cervical: No superficial cervical adenopathy.    Left cervical: No superficial cervical adenopathy.  Neurological:     General: No focal deficit present.     Mental Status: She is alert and oriented to person, place, and time.  Psychiatric:        Mood and Affect: Mood normal.        Behavior: Behavior normal.        Thought Content: Thought content normal.        Judgment: Judgment normal.     UC Treatments / Results  Labs (all labs ordered are listed, but only abnormal results are displayed) Labs Reviewed  RESPIRATORY PANEL BY PCR    EKG   Radiology No results found.  Procedures Procedures (including critical care time)  Medications Ordered in UC Medications - No data to display  Initial Impression / Assessment and Plan / UC Course  I have reviewed the triage vital signs and the nursing notes.  Pertinent labs & imaging results that were available during my care of the patient were reviewed by me and considered in my medical decision making (see chart for details).     This patient is a very pleasant 70 y.o. year old female presenting with suspected influenza. Today this pt is afebrile nontachycardic nontachypneic, oxygenating well on room air, no wheezes rhonchi or rales.    Influenza PCR sent.  Symptoms for 2.5  days, discussed that she is slightly outside of the Tamiflu window, she has Tamiflu at home already and states that she does plan to start taking this tonight.  I sent albuterol inhaler given her history of asthma/emphysema.  Also sent Promethazine DM at patient request.  ED return precautions discussed. Patient verbalizes understanding and agreement.     Final Clinical Impressions(s) / UC Diagnoses   Final diagnoses:  Suspected novel influenza A virus infection     Discharge Instructions      -Tamiflu twice daily x5 days. This medication can cause nausea, so I also sent nausea medication. You can stop  the Tamiflu if you don't like it or if it causes side effects.  -Albuterol inhaler as needed for cough, wheezing, shortness of breath, 1 to 2 puffs every 6 hours as needed. -Promethazine DM cough syrup for congestion/cough. This could make you drowsy, so take at night before bed. -For fevers/chills, bodyaches, headaches- You can take Tylenol up to 1000 mg 3 times daily, and ibuprofen up to 600 mg 3 times daily with food.  You can take these together, or alternate every 3-4 hours. -Drink plenty of water/gatorade and get plenty of rest -With a virus, you're typically contagious for 5-7 days, or as long as you're having fevers.  -Come back and see [place] if things are getting worse instead of better, like shortness of breath, chest pain, fevers and chills that are getting higher instead of lower and do not come down with Tylenol or ibuprofen, etc.    ED Prescriptions     Medication Sig Dispense Auth. Provider   promethazine-dextromethorphan (PROMETHAZINE-DM) 6.25-15 MG/5ML syrup Take 2.5 mLs by mouth 4 (four) times daily as needed for cough. 50 mL Hazel Sams, PA-C   albuterol (VENTOLIN HFA) 108 (90 Base) MCG/ACT inhaler Inhale 1-2 puffs into the lungs every 6 (six) hours as needed for wheezing or shortness of breath. 1 each Hazel Sams, PA-C      PDMP not reviewed this encounter.   Hazel Sams, PA-C 11/26/21 [phone]

## 2021-12-05 ENCOUNTER — Other Ambulatory Visit: Payer: Self-pay

## 2021-12-05 ENCOUNTER — Encounter: Payer: Self-pay | Admitting: Family

## 2021-12-05 DIAGNOSIS — Z87891 Personal history of nicotine dependence: Secondary | ICD-10-CM

## 2021-12-06 ENCOUNTER — Other Ambulatory Visit: Payer: Self-pay | Admitting: Family

## 2021-12-06 ENCOUNTER — Telehealth: Payer: Self-pay | Admitting: Acute Care

## 2021-12-06 DIAGNOSIS — Z13820 Encounter for screening for osteoporosis: Secondary | ICD-10-CM

## 2021-12-06 DIAGNOSIS — E2839 Other primary ovarian failure: Secondary | ICD-10-CM

## 2021-12-06 NOTE — Telephone Encounter (Signed)
Left voicemail for patient to call to schedule LDCT due now.  Had previously scheduled for 12/11/21 at [facility] but will not accept her insurance.  Left VM 12/05/21 and 12/06/21 that CT on 12/11/21 has been cancelled and we need to reschedule at another location.

## 2021-12-07 ENCOUNTER — Other Ambulatory Visit: Payer: Self-pay

## 2021-12-07 DIAGNOSIS — Z87891 Personal history of nicotine dependence: Secondary | ICD-10-CM

## 2021-12-11 ENCOUNTER — Ambulatory Visit: Payer: Medicare Other

## 2021-12-12 ENCOUNTER — Ambulatory Visit
Admission: RE | Admit: 2021-12-12 | Discharge: 2021-12-12 | Disposition: A | Discharge status: Home / Self Care | Payer: Medicare Other | Source: Ambulatory Visit | Attending: Acute Care | Admitting: Acute Care

## 2021-12-12 DIAGNOSIS — I700 Atherosclerosis of aorta: Secondary | ICD-10-CM | POA: Diagnosis not present

## 2021-12-12 DIAGNOSIS — Z87891 Personal history of nicotine dependence: Secondary | ICD-10-CM

## 2021-12-12 DIAGNOSIS — I2510 Atherosclerotic heart disease of native coronary artery without angina pectoris: Secondary | ICD-10-CM | POA: Diagnosis not present

## 2021-12-15 ENCOUNTER — Other Ambulatory Visit: Payer: Self-pay | Admitting: Family

## 2021-12-17 ENCOUNTER — Other Ambulatory Visit: Payer: Self-pay | Admitting: Family

## 2021-12-19 ENCOUNTER — Other Ambulatory Visit: Payer: Self-pay | Admitting: Acute Care

## 2021-12-19 DIAGNOSIS — Z87891 Personal history of nicotine dependence: Secondary | ICD-10-CM

## 2022-01-01 DIAGNOSIS — Z20822 Contact with and (suspected) exposure to covid-19: Secondary | ICD-10-CM | POA: Diagnosis not present

## 2022-01-04 ENCOUNTER — Ambulatory Visit (INDEPENDENT_AMBULATORY_CARE_PROVIDER_SITE_OTHER)
Admission: RE | Admit: 2022-01-04 | Discharge: 2022-01-04 | Disposition: A | Discharge status: Home / Self Care | Payer: Medicare Other | Source: Ambulatory Visit | Attending: Family | Admitting: Family

## 2022-01-04 ENCOUNTER — Other Ambulatory Visit: Payer: Self-pay

## 2022-01-04 DIAGNOSIS — Z13820 Encounter for screening for osteoporosis: Secondary | ICD-10-CM

## 2022-01-04 DIAGNOSIS — E2839 Other primary ovarian failure: Secondary | ICD-10-CM | POA: Diagnosis not present

## 2022-01-07 ENCOUNTER — Encounter: Payer: Self-pay | Admitting: Family

## 2022-01-11 DIAGNOSIS — M545 Low back pain, unspecified: Secondary | ICD-10-CM | POA: Diagnosis not present

## 2022-01-19 ENCOUNTER — Other Ambulatory Visit: Payer: Self-pay | Admitting: Family

## 2022-01-19 DIAGNOSIS — I10 Essential (primary) hypertension: Secondary | ICD-10-CM

## 2022-01-23 ENCOUNTER — Other Ambulatory Visit: Payer: Self-pay | Admitting: Family

## 2022-02-13 DIAGNOSIS — R52 Pain, unspecified: Secondary | ICD-10-CM | POA: Diagnosis not present

## 2022-02-13 DIAGNOSIS — B349 Viral infection, unspecified: Secondary | ICD-10-CM | POA: Diagnosis not present

## 2022-02-13 DIAGNOSIS — R509 Fever, unspecified: Secondary | ICD-10-CM | POA: Diagnosis not present

## 2022-02-13 DIAGNOSIS — U071 COVID-19: Secondary | ICD-10-CM | POA: Diagnosis not present

## 2022-02-20 DIAGNOSIS — Z20822 Contact with and (suspected) exposure to covid-19: Secondary | ICD-10-CM | POA: Diagnosis not present

## 2022-03-01 DIAGNOSIS — J0100 Acute maxillary sinusitis, unspecified: Secondary | ICD-10-CM | POA: Diagnosis not present

## 2022-03-06 ENCOUNTER — Other Ambulatory Visit: Payer: Self-pay | Admitting: *Deleted

## 2022-03-06 ENCOUNTER — Other Ambulatory Visit: Payer: Self-pay | Admitting: Hematology and Oncology

## 2022-03-06 DIAGNOSIS — D509 Iron deficiency anemia, unspecified: Secondary | ICD-10-CM

## 2022-03-07 ENCOUNTER — Other Ambulatory Visit: Payer: Self-pay | Admitting: Hematology and Oncology

## 2022-03-07 ENCOUNTER — Other Ambulatory Visit: Payer: Self-pay

## 2022-03-07 ENCOUNTER — Inpatient Hospital Stay: Payer: Medicare Other | Attending: Hematology and Oncology

## 2022-03-07 ENCOUNTER — Inpatient Hospital Stay: Payer: Medicare Other

## 2022-03-07 DIAGNOSIS — E119 Type 2 diabetes mellitus without complications: Secondary | ICD-10-CM | POA: Diagnosis not present

## 2022-03-07 DIAGNOSIS — D519 Vitamin B12 deficiency anemia, unspecified: Secondary | ICD-10-CM

## 2022-03-07 DIAGNOSIS — D509 Iron deficiency anemia, unspecified: Secondary | ICD-10-CM | POA: Diagnosis not present

## 2022-03-07 LAB — FERRITIN: Ferritin: 165 ng/mL (ref 11–307)

## 2022-03-07 LAB — CBC WITH DIFFERENTIAL/PLATELET
Abs Immature Granulocytes: 0.02 10*3/uL (ref 0.00–0.07)
Basophils Absolute: 0.1 10*3/uL (ref 0.0–0.1)
Basophils Relative: 1 %
Eosinophils Absolute: 0.3 10*3/uL (ref 0.0–0.5)
Eosinophils Relative: 5 %
HCT: 36 % (ref 36.0–46.0)
Hemoglobin: 11.9 g/dL — ABNORMAL LOW (ref 12.0–15.0)
Immature Granulocytes: 0 %
Lymphocytes Relative: 22 %
Lymphs Abs: 1.4 10*3/uL (ref 0.7–4.0)
MCH: 30.4 pg (ref 26.0–34.0)
MCHC: 33.1 g/dL (ref 30.0–36.0)
MCV: 92.1 fL (ref 80.0–100.0)
Monocytes Absolute: 0.3 10*3/uL (ref 0.1–1.0)
Monocytes Relative: 5 %
Neutro Abs: 4.3 10*3/uL (ref 1.7–7.7)
Neutrophils Relative %: 67 %
Platelets: 281 10*3/uL (ref 150–400)
RBC: 3.91 MIL/uL (ref 3.87–5.11)
RDW: 13 % (ref 11.5–15.5)
WBC: 6.3 10*3/uL (ref 4.0–10.5)
nRBC: 0 % (ref 0.0–0.2)

## 2022-03-07 LAB — HEMOGLOBIN A1C
Hgb A1c MFr Bld: 5.8 % — ABNORMAL HIGH (ref 4.8–5.6)
Mean Plasma Glucose: 119.76 mg/dL

## 2022-03-07 LAB — IRON AND IRON BINDING CAPACITY (CC-WL,HP ONLY)
Iron: 54 ug/dL (ref 28–170)
Saturation Ratios: 17 % (ref 10.4–31.8)
TIBC: 319 ug/dL (ref 250–450)
UIBC: 265 ug/dL (ref 148–442)

## 2022-03-08 ENCOUNTER — Inpatient Hospital Stay (HOSPITAL_BASED_OUTPATIENT_CLINIC_OR_DEPARTMENT_OTHER): Payer: Medicare Other | Admitting: Hematology and Oncology

## 2022-03-08 ENCOUNTER — Encounter: Payer: Self-pay | Admitting: Hematology and Oncology

## 2022-03-08 DIAGNOSIS — E119 Type 2 diabetes mellitus without complications: Secondary | ICD-10-CM | POA: Diagnosis not present

## 2022-03-08 DIAGNOSIS — D509 Iron deficiency anemia, unspecified: Secondary | ICD-10-CM

## 2022-03-08 NOTE — Progress Notes (Signed)
? ?HEMATOLOGY-ONCOLOGY ELECTRONIC VISIT PROGRESS NOTE ? ?Patient Care Team: ?Marrian Salvage, FNP as PCP - General (Internal Medicine) ?Elveria Rising, MD (Obstetrics and Gynecology) ?Lafayette Dragon, MD (Inactive) (Gastroenterology) ?Minus Breeding, MD as Consulting Physician (Cardiology) ?Rigoberto Noel, MD as Consulting Physician (Pulmonary Disease) ? ?I connected with the patient via telephone conference and verified that I am speaking with the correct person using two identifiers. The patient's location is at home and I am providing care from the [facility] ?I discussed the limitations, risks, security and privacy concerns of performing an evaluation and management service by e-visits and the availability of in person appointments.  ?I also discussed with the patient that there may be a patient responsible charge related to this service. The patient expressed understanding and agreed to proceed.  ? ?ASSESSMENT & PLAN:  ?Iron deficiency anemia ?She is not symptomatic at all today ?Her iron studies and hemoglobin is stable ?She does not need IV iron ?Plan to repeat blood work again in a few months for further follow-up ? ?Diabetes mellitus type 2, controlled, without complications ([place]) ?Patient requested hemoglobin A1c to be drawn ?The results are favorable ?She will continue medical management as directed by her primary care doctor but requested hemoglobin A1c to be drawn again at the end of the year ? ?Orders Placed This Encounter  ?Procedures  ? Iron and Iron Binding Capacity ([facility] only)  ?  Standing Status:   Future  ?  Standing Expiration Date:   03/09/2023  ? CBC with Differential ([facility] Only)  ?  Standing Status:   Future  ?  Standing Expiration Date:   03/09/2023  ? Ferritin  ?  Standing Status:   Future  ?  Standing Expiration Date:   03/08/2023  ? Hemoglobin A1c  ?  Standing Status:   Future  ?  Standing Expiration Date:   03/08/2023  ? ? ?INTERVAL HISTORY: ?Please see below for  problem oriented charting. ?The purpose of today's discussion is to review recent test results ?She have history of iron deficiency and B12 deficiency anemia and had received intravenous iron infusion in May 2022 ?She felt fine ?Her energy level is fair ?Denies recent bleeding ? ?SUMMARY OF HEMATOLOGIC HISTORY: ? ?She was found to have abnormal CBC from recent blood work ?I have the opportunity to review her CBC dated back to June 2015 ?Hemoglobin ranged from 10.3-11.9 ?Her most recent blood work show evidence of severe iron deficiency ?She had EGD and colonoscopy performed in 2015 which showed no evidence of GI bleed ?She denies recent chest pain on exertion, but does complain of shortness of breath on minimal exertion and excessive fatigue. ?She had not noticed any recent bleeding such as epistaxis but did states she has occasional hemorrhoidal bleeding once a month ?She had taken NSAID in the past for chronic joint pain but nothing recently.  She is on chronic aspirin therapy ? ?She had history of abnormal PAP smear but no prior history of cancer. Her age appropriate screening programs are up-to-date. ?She eats a variety of diet ?She used to donate blood a lot in the past but nothing in the last 20 years ?She has pica and craves ice ?The patient was prescribed oral iron supplements and she takes in the past but that cause significant constipation ?Between December 2021 to February 2022, she received multiple doses of IV iron; she received her final dose of IV iron in May 2022 ? ?REVIEW OF SYSTEMS:   ?Constitutional: Denies  fevers, chills or abnormal weight loss ?Eyes: Denies blurriness of vision ?Ears, nose, mouth, throat, and face: Denies mucositis or sore throat ?Respiratory: Denies cough, dyspnea or wheezes ?Cardiovascular: Denies palpitation, chest discomfort ?Gastrointestinal:  Denies nausea, heartburn or change in bowel habits ?Skin: Denies abnormal skin rashes ?Lymphatics: Denies new lymphadenopathy or  easy bruising ?Neurological:Denies numbness, tingling or new weaknesses ?Behavioral/Psych: Mood is stable, no new changes  ?Extremities: No lower extremity edema ?All other systems were reviewed with the patient and are negative. ? ?I have reviewed the past medical history, past surgical history, social history and family history with the patient and they are unchanged from previous note. ? ?ALLERGIES:  has No Known Allergies. ? ?MEDICATIONS:  ?Current Outpatient Medications  ?Medication Sig Dispense Refill  ? ALPRAZolam (XANAX) 0.5 MG tablet Take 1 tablet (0.5 mg total) by mouth 2 (two) times daily as needed for anxiety. 60 tablet 0  ? amLODipine (NORVASC) 2.5 MG tablet TAKE 1 TABLET(2.5 MG) BY MOUTH DAILY 90 tablet 1  ? aspirin 81 MG tablet Take 81 mg by mouth daily.    ? escitalopram (LEXAPRO) 20 MG tablet TAKE 1 TABLET BY MOUTH EVERY DAY 90 tablet 3  ? furosemide (LASIX) 20 MG tablet TAKE 1 TABLET BY MOUTH TWICE A DAY 180 tablet 0  ? irbesartan (AVAPRO) 300 MG tablet TAKE 1 TABLET BY MOUTH EVERY DAY 90 tablet 1  ? metFORMIN (GLUCOPHAGE) 1000 MG tablet TAKE 1 TABLET BY MOUTH TWICE DAILY WITH A MEAL 180 tablet 0  ? rosuvastatin (CRESTOR) 10 MG tablet TAKE 1 TABLET BY MOUTH EVERY DAY 90 tablet 1  ? TRADJENTA 5 MG TABS tablet TAKE 1 TABLET BY MOUTH EVERY DAY 90 tablet 3  ? ?No current facility-administered medications for this visit.  ? ? ?PHYSICAL EXAMINATION: ?ECOG PERFORMANCE STATUS: 0 - Asymptomatic ? ?LABORATORY DATA:  ?I have reviewed the data as listed ?CMP Latest Ref Rng & Units 11/02/2021 10/31/2020 11/15/2019  ?Glucose 65 - 99 mg/dL 114(H) 165(H) 114(H)  ?BUN 7 - 25 mg/dL '10 12 11  '$ ?Creatinine 0.60 - 1.00 mg/dL 0.78 0.72 0.68  ?Sodium 135 - 146 mmol/L 141 138 138  ?Potassium 3.5 - 5.3 mmol/L 3.8 4.1 3.8  ?Chloride 98 - 110 mmol/L 103 103 103  ?CO2 20 - 32 mmol/L '28 26 27  '$ ?Calcium 8.6 - 10.4 mg/dL 9.5 9.2 9.1  ?Total Protein 6.1 - 8.1 g/dL 6.9 7.1 7.2  ?Total Bilirubin 0.2 - 1.2 mg/dL 0.4 0.4 0.3  ?Alkaline  Phos 39 - 117 U/L - 71 72  ?AST 10 - 35 U/L '19 16 14  '$ ?ALT 6 - 29 U/L '17 11 11  '$ ? ? ?Lab Results  ?Component Value Date  ? WBC 6.3 03/07/2022  ? HGB 11.9 (L) 03/07/2022  ? HCT 36.0 03/07/2022  ? MCV 92.1 03/07/2022  ? PLT 281 03/07/2022  ? NEUTROABS 4.3 03/07/2022  ? ? ? ?I discussed the assessment and treatment plan with the patient. The patient was provided an opportunity to ask questions and all were answered. The patient agreed with the plan and demonstrated an understanding of the instructions. The patient was advised to call back or seek an in-person evaluation if the symptoms worsen or if the condition fails to improve as anticipated.  ?  ?I spent 20 minutes for the appointment reviewing test results, discuss management and coordination of care. ? ?Heath Lark, MD ?03/08/2022 1:11 PM ? ?

## 2022-03-08 NOTE — Assessment & Plan Note (Signed)
She is not symptomatic at all today ?Her iron studies and hemoglobin is stable ?She does not need IV iron ?Plan to repeat blood work again in a few months for further follow-up ?

## 2022-03-08 NOTE — Assessment & Plan Note (Signed)
Patient requested hemoglobin A1c to be drawn ?The results are favorable ?She will continue medical management as directed by her primary care doctor but requested hemoglobin A1c to be drawn again at the end of the year ?

## 2022-03-19 DIAGNOSIS — Z20822 Contact with and (suspected) exposure to covid-19: Secondary | ICD-10-CM | POA: Diagnosis not present

## 2022-04-04 ENCOUNTER — Telehealth: Payer: Self-pay | Admitting: Family

## 2022-04-04 DIAGNOSIS — M1712 Unilateral primary osteoarthritis, left knee: Secondary | ICD-10-CM | POA: Diagnosis not present

## 2022-04-04 NOTE — Telephone Encounter (Signed)
Left message for patient to call back and schedule Medicare Annual Wellness Visit (AWV). Please offer to do virtually or by telephone.  Left office number and my jabber #[phone]. ? ?Due for AWVI ? ?Please schedule at anytime with Nurse Health Advisor. ?  ?

## 2022-04-11 DIAGNOSIS — M1712 Unilateral primary osteoarthritis, left knee: Secondary | ICD-10-CM | POA: Diagnosis not present

## 2022-04-18 ENCOUNTER — Other Ambulatory Visit: Payer: Self-pay | Admitting: Family

## 2022-04-18 DIAGNOSIS — I10 Essential (primary) hypertension: Secondary | ICD-10-CM

## 2022-05-01 DIAGNOSIS — Z20822 Contact with and (suspected) exposure to covid-19: Secondary | ICD-10-CM | POA: Diagnosis not present

## 2022-05-04 IMAGING — CT CT CHEST LUNG CANCER SCREENING LOW DOSE W/O CM
2 of 5 series · 14 of 40 positions shown, 17 images · non-contrast
Comparison: 08/17/2019

CLINICAL DATA: 70-year-old female with 48 pack-year history of
smoking. Lung cancer screening.

EXAM:
CT CHEST WITHOUT CONTRAST LOW-DOSE FOR LUNG CANCER SCREENING
TECHNIQUE: Multidetector CT imaging of the chest was performed following the
standard protocol without IV contrast.

[Series 4: lung 1.00 br44 cor · coronal · 0.53mm/px · 3 of 416 slices shown]
[im 84/416  lung]
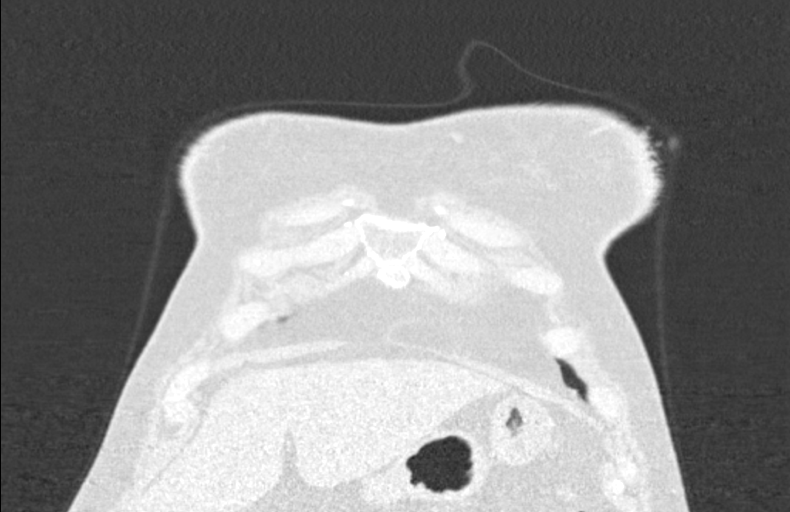
[im 167/416  lung]
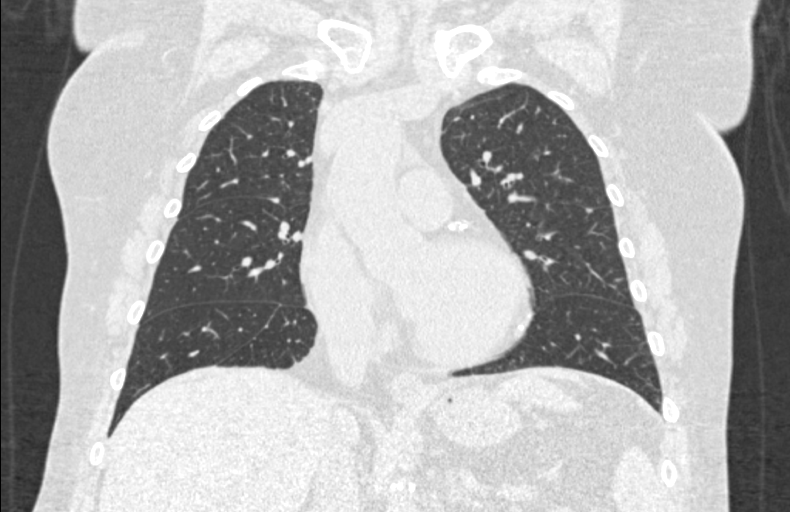
[im 250/416  lung]
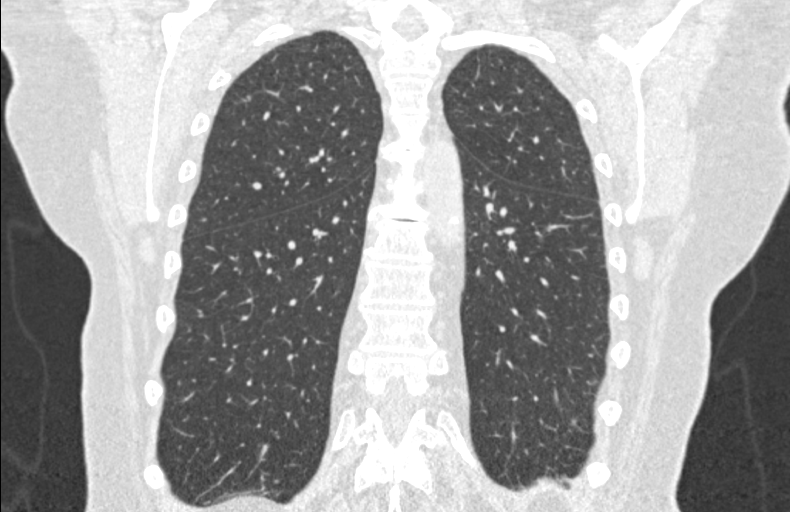

[Series 9: lung 1.00 br60 axial · axial · 0.82mm/px · z∈[-1190,-914]mm · 11 of 318 slices shown, 14 images]
[im 28/318  mediastinal]
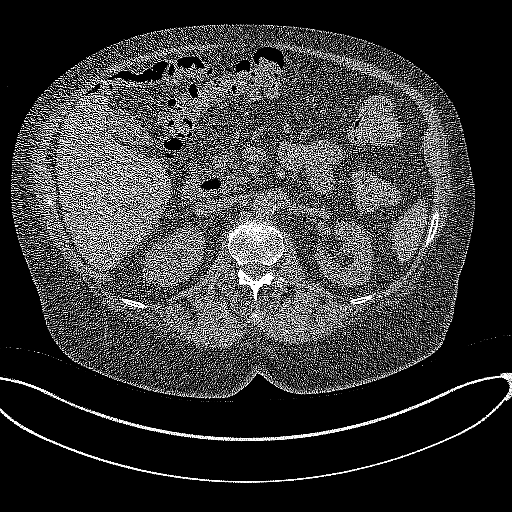
[im 28/318  lung]
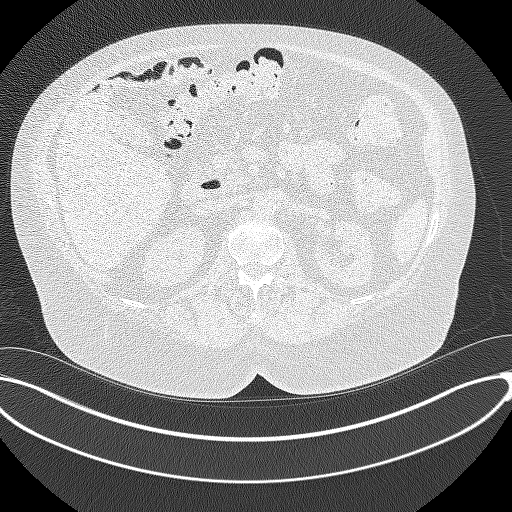
[im 56/318  lung]
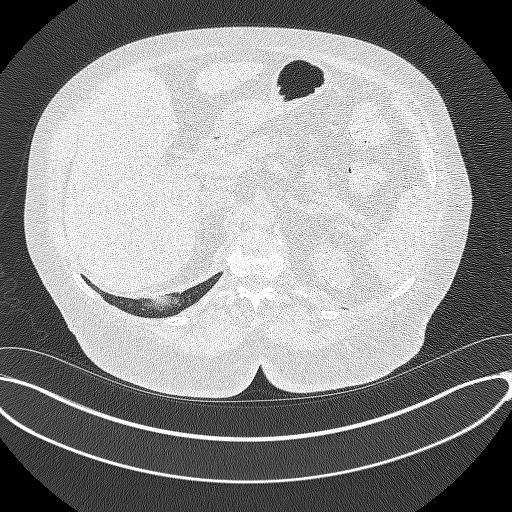
[im 83/318  lung]
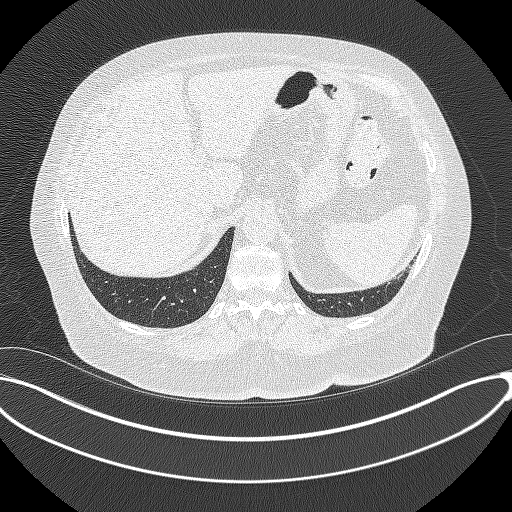
[im 111/318  lung]
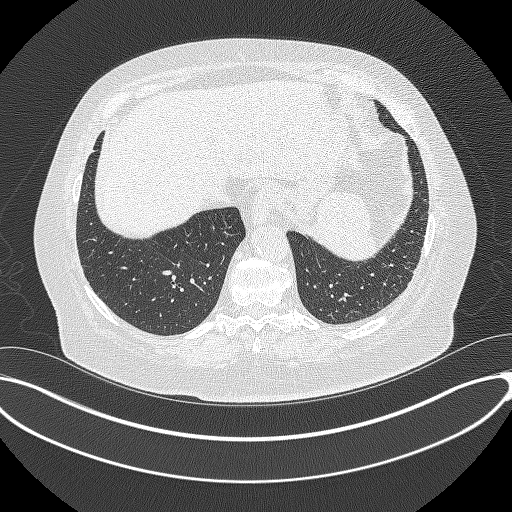
[im 138/318  mediastinal]
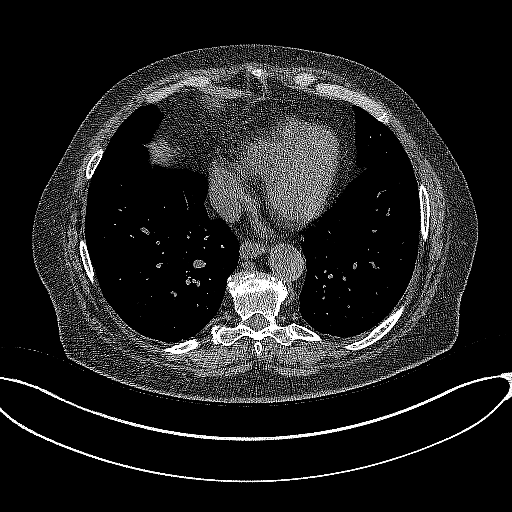
[im 138/318  lung]
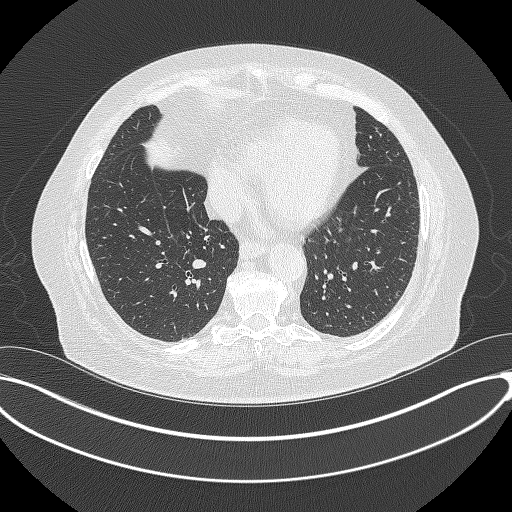
[im 166/318  lung]
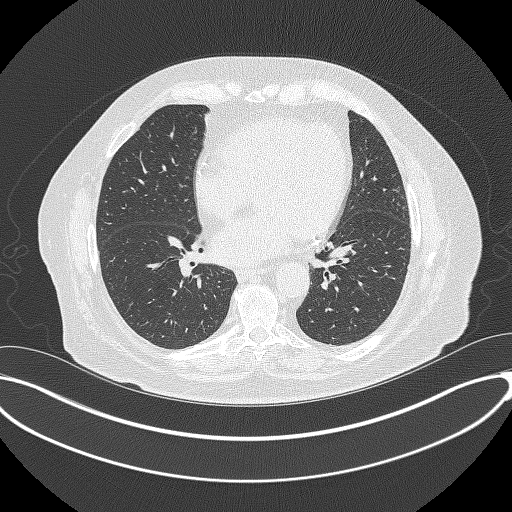
[im 193/318  lung]
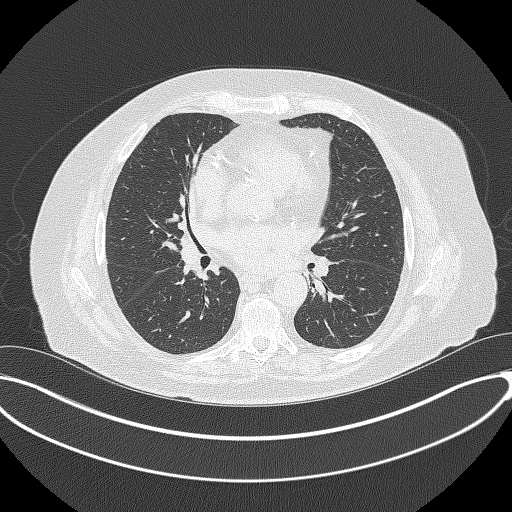
[im 221/318  lung]
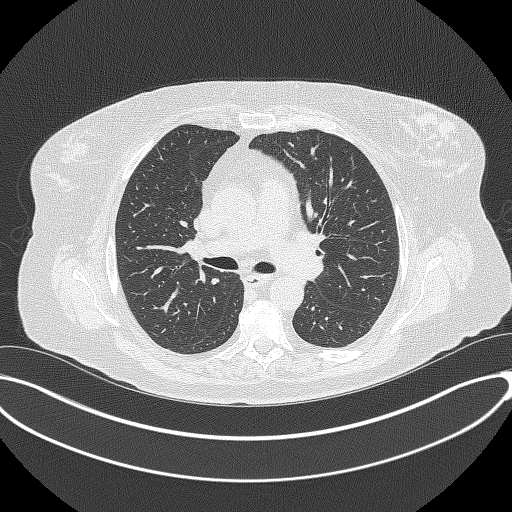
[im 249/318  mediastinal]
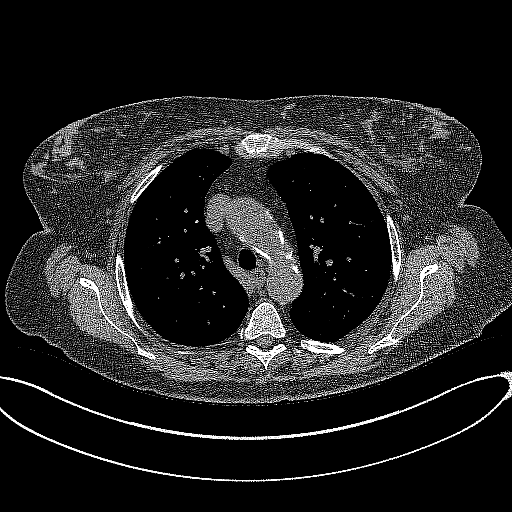
[im 249/318  lung]
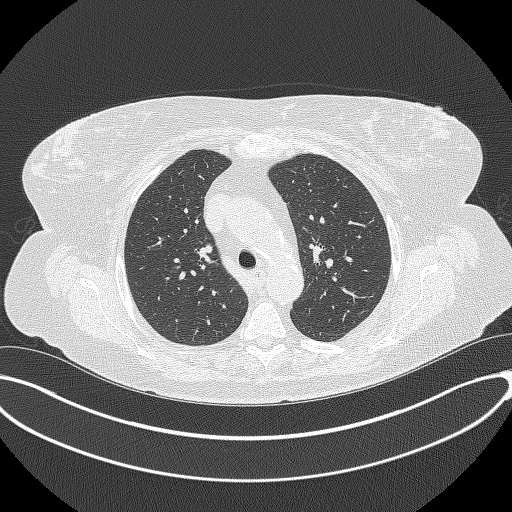
[im 276/318  lung]
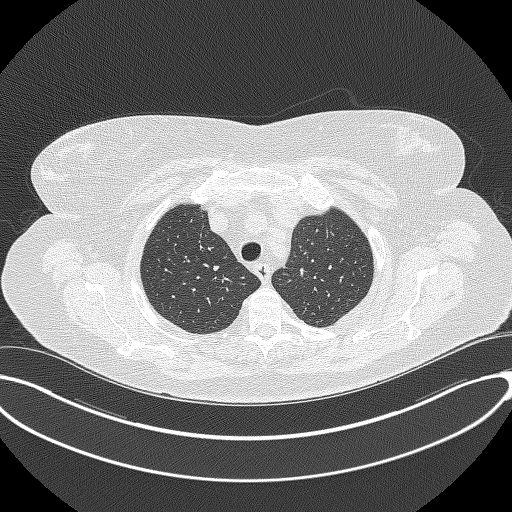
[im 304/318  lung]
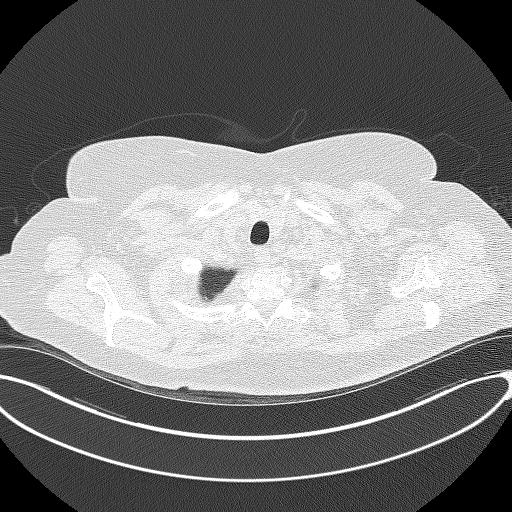

[14 of 40 positions shown; findings below may reference images not displayed]

FINDINGS: Cardiovascular: The heart size is normal. No substantial pericardial
effusion. Coronary artery calcification is evident. Mild
atherosclerotic calcification is noted in the wall of the thoracic
aorta.

Mediastinum/Nodes: No mediastinal lymphadenopathy. No evidence for
gross hilar lymphadenopathy although assessment is limited by the
lack of intravenous contrast on the current study. The esophagus has
normal imaging features. There is no axillary lymphadenopathy.

Lungs/Pleura: Scattered tiny bilateral pulmonary nodules identified
previously are stable in the interval. No new suspicious pulmonary
nodule or mass. No focal airspace consolidation. No pleural
effusion.

Upper Abdomen: Unremarkable.

Musculoskeletal: No worrisome lytic or sclerotic osseous
abnormality.
IMPRESSION: 1. Lung-RADS 2, benign appearance or behavior. Continue annual
screening with low-dose chest CT without contrast in 12 months.
2. Aortic Atherosclerosis ([id number]).

## 2022-05-13 DIAGNOSIS — L738 Other specified follicular disorders: Secondary | ICD-10-CM | POA: Diagnosis not present

## 2022-05-18 DIAGNOSIS — L509 Urticaria, unspecified: Secondary | ICD-10-CM | POA: Diagnosis not present

## 2022-05-22 DIAGNOSIS — L237 Allergic contact dermatitis due to plants, except food: Secondary | ICD-10-CM | POA: Diagnosis not present

## 2022-05-24 DIAGNOSIS — M1712 Unilateral primary osteoarthritis, left knee: Secondary | ICD-10-CM | POA: Diagnosis not present

## 2022-05-24 DIAGNOSIS — M1711 Unilateral primary osteoarthritis, right knee: Secondary | ICD-10-CM | POA: Diagnosis not present

## 2022-05-28 ENCOUNTER — Encounter: Payer: Self-pay | Admitting: Family

## 2022-06-02 ENCOUNTER — Emergency Department (HOSPITAL_COMMUNITY): Payer: Medicare Other

## 2022-06-02 ENCOUNTER — Encounter (HOSPITAL_COMMUNITY): Payer: Self-pay

## 2022-06-02 ENCOUNTER — Emergency Department (HOSPITAL_COMMUNITY)
Admission: EM | Admit: 2022-06-02 | Discharge: 2022-06-03 | Disposition: A | Discharge status: Home / Self Care | Payer: Medicare Other | Attending: Emergency Medicine | Admitting: Emergency Medicine

## 2022-06-02 DIAGNOSIS — Z7982 Long term (current) use of aspirin: Secondary | ICD-10-CM | POA: Insufficient documentation

## 2022-06-02 DIAGNOSIS — K8020 Calculus of gallbladder without cholecystitis without obstruction: Secondary | ICD-10-CM | POA: Insufficient documentation

## 2022-06-02 DIAGNOSIS — Z7984 Long term (current) use of oral hypoglycemic drugs: Secondary | ICD-10-CM | POA: Diagnosis not present

## 2022-06-02 DIAGNOSIS — D72829 Elevated white blood cell count, unspecified: Secondary | ICD-10-CM | POA: Insufficient documentation

## 2022-06-02 DIAGNOSIS — K8050 Calculus of bile duct without cholangitis or cholecystitis without obstruction: Secondary | ICD-10-CM | POA: Diagnosis not present

## 2022-06-02 DIAGNOSIS — I10 Essential (primary) hypertension: Secondary | ICD-10-CM | POA: Insufficient documentation

## 2022-06-02 DIAGNOSIS — Z79899 Other long term (current) drug therapy: Secondary | ICD-10-CM | POA: Diagnosis not present

## 2022-06-02 DIAGNOSIS — R0789 Other chest pain: Secondary | ICD-10-CM | POA: Diagnosis not present

## 2022-06-02 DIAGNOSIS — R079 Chest pain, unspecified: Secondary | ICD-10-CM | POA: Diagnosis not present

## 2022-06-02 DIAGNOSIS — E119 Type 2 diabetes mellitus without complications: Secondary | ICD-10-CM | POA: Insufficient documentation

## 2022-06-02 DIAGNOSIS — R0602 Shortness of breath: Secondary | ICD-10-CM | POA: Diagnosis not present

## 2022-06-02 DIAGNOSIS — R072 Precordial pain: Secondary | ICD-10-CM | POA: Diagnosis not present

## 2022-06-02 DIAGNOSIS — R1011 Right upper quadrant pain: Secondary | ICD-10-CM | POA: Diagnosis not present

## 2022-06-02 DIAGNOSIS — R531 Weakness: Secondary | ICD-10-CM | POA: Diagnosis not present

## 2022-06-02 LAB — BASIC METABOLIC PANEL
Anion gap: 12 (ref 5–15)
BUN: 19 mg/dL (ref 8–23)
CO2: 25 mmol/L (ref 22–32)
Calcium: 9.4 mg/dL (ref 8.9–10.3)
Chloride: 101 mmol/L (ref 98–111)
Creatinine, Ser: 0.9 mg/dL (ref 0.44–1.00)
GFR, Estimated: >60 mL/min (ref >60)
Glucose, Bld: 207 mg/dL — ABNORMAL HIGH (ref 70–99)
Potassium: 4.2 mmol/L (ref 3.5–5.1)
Sodium: 138 mmol/L (ref 135–145)

## 2022-06-02 LAB — CBC
HCT: 38.5 % (ref 36.0–46.0)
Hemoglobin: 12.8 g/dL (ref 12.0–15.0)
MCH: 30.7 pg (ref 26.0–34.0)
MCHC: 33.2 g/dL (ref 30.0–36.0)
MCV: 92.3 fL (ref 80.0–100.0)
Platelets: 255 10*3/uL (ref 150–400)
RBC: 4.17 MIL/uL (ref 3.87–5.11)
RDW: 13.4 % (ref 11.5–15.5)
WBC: 11.8 10*3/uL — ABNORMAL HIGH (ref 4.0–10.5)
nRBC: 0 % (ref 0.0–0.2)

## 2022-06-02 LAB — TROPONIN I (HIGH SENSITIVITY): Troponin I (High Sensitivity): 5 ng/L (ref <18)

## 2022-06-02 NOTE — ED Triage Notes (Signed)
Pt comes via [facility] for R sided CP and SOB that started a few hours ago, PTA took 324 ASA and is now pain free

## 2022-06-03 ENCOUNTER — Emergency Department (HOSPITAL_COMMUNITY): Payer: Medicare Other

## 2022-06-03 DIAGNOSIS — R1011 Right upper quadrant pain: Secondary | ICD-10-CM | POA: Diagnosis not present

## 2022-06-03 DIAGNOSIS — K8020 Calculus of gallbladder without cholecystitis without obstruction: Secondary | ICD-10-CM | POA: Diagnosis not present

## 2022-06-03 DIAGNOSIS — R072 Precordial pain: Secondary | ICD-10-CM | POA: Diagnosis not present

## 2022-06-03 DIAGNOSIS — M1711 Unilateral primary osteoarthritis, right knee: Secondary | ICD-10-CM | POA: Diagnosis not present

## 2022-06-03 LAB — HEPATIC FUNCTION PANEL
ALT: 24 U/L (ref 0–44)
AST: 20 U/L (ref 15–41)
Albumin: 3.3 g/dL — ABNORMAL LOW (ref 3.5–5.0)
Alkaline Phosphatase: 59 U/L (ref 38–126)
Bilirubin, Direct: <0.1 mg/dL (ref 0.0–0.2)
Total Bilirubin: 0.3 mg/dL (ref 0.3–1.2)
Total Protein: 6.4 g/dL — ABNORMAL LOW (ref 6.5–8.1)

## 2022-06-03 LAB — LIPASE, BLOOD: Lipase: 29 U/L (ref 11–51)

## 2022-06-03 LAB — TROPONIN I (HIGH SENSITIVITY): Troponin I (High Sensitivity): 5 ng/L (ref <18)

## 2022-06-03 NOTE — ED Provider Notes (Signed)
[name] Provider Note   CSN: [id number] Arrival date & time: 06/02/22  2048     History  Chief Complaint  Patient presents with   Chest Pain    [name] is a 71 y.o. female.  The history is provided by the patient and medical records.  Chest Pain Meredith [name] is a 71 y.o. female who presents to the Emergency Department complaining of chest pain.  She presents to the emergency department complaining of severe right-sided chest pain that started around 845 today.  She took 324 of aspirin prior to ED arrival.  Pain lasted for about 10 to 15 minutes and she had associated shortness of breath.  Pain is currently resolved at time of assessment.  No associated fever, cough, abdominal pain, nausea, vomiting.  She does have a history of hypertension.  She has also been treated for poison ivy with a prednisone course that was started on Wednesday of this week. No history of DVT/PE.  No history of heart disease.  No recent surgeries.  No hormone replacement.  No tobacco use.  Home Medications Prior to Admission medications   Medication Sig Start Date End Date Taking? Authorizing Provider  ALPRAZolam Duanne Moron) 0.5 MG tablet Take 1 tablet (0.5 mg total) by mouth 2 (two) times daily as needed for anxiety. 10/31/20   Marrian Salvage, FNP  amLODipine (NORVASC) 2.5 MG tablet TAKE 1 TABLET(2.5 MG) BY MOUTH DAILY 12/17/21   Marrian Salvage, FNP  aspirin 81 MG tablet Take 81 mg by mouth daily.    [provider]  escitalopram (LEXAPRO) 20 MG tablet TAKE 1 TABLET BY MOUTH EVERY DAY 12/17/21   Marrian Salvage, FNP  furosemide (LASIX) 20 MG tablet TAKE 1 TABLET BY MOUTH TWICE A DAY 04/18/22   Marrian Salvage, FNP  irbesartan (AVAPRO) 300 MG tablet TAKE 1 TABLET BY MOUTH EVERY DAY 01/23/22   Marrian Salvage, FNP  metFORMIN (GLUCOPHAGE) 1000 MG tablet TAKE 1 TABLET BY MOUTH TWICE DAILY WITH A MEAL 04/18/22   Marrian Salvage, FNP  rosuvastatin (CRESTOR) 10 MG tablet TAKE 1 TABLET BY MOUTH EVERY DAY 01/21/22   Marrian Salvage, FNP  TRADJENTA 5 MG TABS tablet TAKE 1 TABLET BY MOUTH EVERY DAY 11/05/21   Marrian Salvage, [place]      Allergies    Patient has no known allergies.    Review of Systems   Review of Systems  Cardiovascular:  Positive for chest pain.  All other systems reviewed and are negative.  Physical Exam Updated Vital Signs BP (!) 143/81   Pulse 70   Temp 99.2 F (37.3 C) (Oral)   Resp (!) 21   LMP 12/30/1998 (Approximate)   SpO2 97%  Physical Exam Vitals and nursing note reviewed.  Constitutional:      Appearance: She is well-developed.  HENT:     Head: Normocephalic and atraumatic.  Cardiovascular:     Rate and Rhythm: Normal rate and regular rhythm.     Heart sounds: No murmur heard. Pulmonary:     Effort: Pulmonary effort is normal. No respiratory distress.     Breath sounds: Normal breath sounds.  Abdominal:     Palpations: Abdomen is soft.     Tenderness: There is no guarding or rebound.     Comments: Very mild right upper quadrant tenderness  Musculoskeletal:        General: No swelling or tenderness.  Skin:  General: Skin is warm and dry.  Neurological:     Mental Status: She is alert and oriented to person, place, and time.  Psychiatric:        Behavior: Behavior normal.    ED Results / Procedures / Treatments   Labs (all labs ordered are listed, but only abnormal results are displayed) Labs Reviewed  BASIC METABOLIC PANEL - Abnormal; Notable for the following components:      Result Value   Glucose, Bld 207 (*)    All other components within normal limits  CBC - Abnormal; Notable for the following components:   WBC 11.8 (*)    All other components within normal limits  HEPATIC FUNCTION PANEL - Abnormal; Notable for the following components:   Total Protein 6.4 (*)    Albumin 3.3 (*)    All other components within normal limits   LIPASE, BLOOD  TROPONIN I (HIGH SENSITIVITY)  TROPONIN I (HIGH SENSITIVITY)    EKG EKG Interpretation  Date/Time:  Sunday June 02 2022 20:52:57 EDT Ventricular Rate:  82 PR Interval:  160 QRS Duration: 84 QT Interval:  368 QTC Calculation: 429 R Axis:   49 Text Interpretation: Normal sinus rhythm Normal ECG No previous ECGs available Confirmed by Quintella Reichert [phone]) on 06/03/2022 1:53:50 AM  Radiology DG Chest 2 View  Result Date: 06/02/2022 CLINICAL DATA:  CP EXAM: CHEST - 2 VIEW COMPARISON:  Chest x-ray 04/29/2018, CT chest 12/12/2021 FINDINGS: The heart and mediastinal contours are within normal limits. No focal consolidation. No pulmonary edema. No pleural effusion. No pneumothorax. No acute osseous abnormality. IMPRESSION: No active cardiopulmonary disease. Electronically Signed   By: Iven Finn M.D.   On: 06/02/2022 21:24   US Abdomen Limited RUQ (LIVER/GB)  Result Date: 06/03/2022 CLINICAL DATA:  Right upper quadrant pain. EXAM: ULTRASOUND ABDOMEN LIMITED RIGHT UPPER QUADRANT COMPARISON:  None Available. FINDINGS: Gallbladder: Small shadowing calculus is noted. No gallbladder wall thickening or pericholecystic fluid. Percell Miller sign could not be assessed due to medicated patient. Common bile duct: Diameter: 5 mm. Liver: No focal lesion identified. Within normal limits in parenchymal echogenicity. Portal vein is patent on color Doppler imaging with normal direction of blood flow towards the liver. Other: No ascites. IMPRESSION: Cholelithiasis without acute cholecystitis. Electronically Signed   By: Brett Fairy M.D.   On: 06/03/2022 03:41    Procedures Procedures    Medications Ordered in ED Medications - No data to display  ED Course/ Medical Decision Making/ A&P                           Medical Decision Making Amount and/or Complexity of Data Reviewed Labs: ordered. Radiology: ordered.   Patient with history of hypertension, diabetes here for evaluation of chest  pain.  She has recently been started on prednisone for poison ivy.  EKG is without acute ischemic changes and troponins are negative x2.  She has been pain-free during her ED stay.  She does have mild right upper quadrant tenderness on examination without peritoneal findings, negative Murphy's.  Right upper quadrant ultrasound does demonstrate cholelithiasis but no evidence of cholecystitis.  Patient is able to eat without difficulty.  LFTs are within normal limits.  CBC does have mild leukocytosis but patient is currently on prednisone therapy.  Current clinical picture is not consistent with cholecystitis.  Discussed with patient home care for chest pain as well as potential biliary colic.  Discussed importance of cardiology follow-up, PCP follow-up  as well as return precautions.        Final Clinical Impression(s) / ED Diagnoses Final diagnoses:  Precordial pain  Biliary colic    Rx / DC Orders ED Discharge Orders     None         Quintella Reichert, MD 06/03/22 [phone]

## 2022-06-03 NOTE — ED Notes (Signed)
Pt reports R sided chest pain radiating into back with sob. Took '324mg'$  asa. Reports taking prednisone for poison oak and has not been taking aspirin with prednisone., still has  2 weeks left of prednisone pack left

## 2022-06-05 DIAGNOSIS — H35372 Puckering of macula, left eye: Secondary | ICD-10-CM | POA: Diagnosis not present

## 2022-06-05 DIAGNOSIS — H40013 Open angle with borderline findings, low risk, bilateral: Secondary | ICD-10-CM | POA: Diagnosis not present

## 2022-06-05 DIAGNOSIS — E119 Type 2 diabetes mellitus without complications: Secondary | ICD-10-CM | POA: Diagnosis not present

## 2022-06-05 DIAGNOSIS — H35033 Hypertensive retinopathy, bilateral: Secondary | ICD-10-CM | POA: Diagnosis not present

## 2022-06-10 ENCOUNTER — Telehealth: Payer: Self-pay | Admitting: Gastroenterology

## 2022-06-11 NOTE — Telephone Encounter (Signed)
Spoke with the patient. She was seen in the ER for chest pain. R/o cardiac etiology. Patient has had another "attack" after eating ice cream. She did not go to the ER for this attack. She is also having issues with urgent loose bowel movements. She would like to be evaluated. Appointment scheduled.  Briefly discussed low fat diet.

## 2022-06-26 ENCOUNTER — Other Ambulatory Visit: Payer: Self-pay | Admitting: Family

## 2022-06-28 ENCOUNTER — Ambulatory Visit (INDEPENDENT_AMBULATORY_CARE_PROVIDER_SITE_OTHER): Payer: Medicare Other

## 2022-06-28 DIAGNOSIS — Z0000 Encounter for general adult medical examination without abnormal findings: Secondary | ICD-10-CM | POA: Diagnosis not present

## 2022-06-28 NOTE — Progress Notes (Addendum)
Subjective:   [name] is a 71 y.o. female who presents for an Initial Medicare Annual Wellness Visit.  I connected with  [name] on 06/28/22 by a audio enabled telemedicine application and verified that I am speaking with the correct person using two identifiers.  Patient Location: Home  Provider Location: Office/Clinic  I discussed the limitations of evaluation and management by telemedicine. The patient expressed understanding and agreed to proceed.   Review of Systems     Cardiac Risk Factors include: advanced age (>74mn, >>79women);diabetes mellitus;hypertension;dyslipidemia;obesity (BMI >30kg/m2)     Objective:    There were no vitals filed for this visit. There is no height or weight on file to calculate BMI.     06/28/2022    9:04 AM 06/02/2022    8:53 PM 02/26/2021    1:04 PM 01/05/2021    5:25 PM 12/26/2020    2:05 PM 12/05/2020    1:22 PM 11/20/2018   12:38 PM  Advanced Directives  Does Patient Have a Medical Advance Directive? Yes No Yes No No No Yes  Type of [place] of facility DNR (pink MOST or yellow form);Living will  [place] will      Does patient want to make changes to medical advance directive? No - Patient declined      No - Patient declined  Copy of HAgencyin Chart? No - copy requested  No - copy requested      Would patient like information on creating a medical advance directive?    No - Patient declined No - Patient declined No - Patient declined     Current Medications (verified) Outpatient Encounter Medications as of 06/28/2022  Medication Sig   ALPRAZolam (XANAX) 0.5 MG tablet Take 1 tablet (0.5 mg total) by mouth 2 (two) times daily as needed for anxiety.   amLODipine (NORVASC) 2.5 MG tablet TAKE 1 TABLET BY MOUTH EVERY DAY   aspirin 81 MG tablet Take 81 mg by mouth daily.   escitalopram (LEXAPRO) 20 MG tablet TAKE 1 TABLET BY MOUTH EVERY DAY    furosemide (LASIX) 20 MG tablet TAKE 1 TABLET BY MOUTH TWICE A DAY   irbesartan (AVAPRO) 300 MG tablet TAKE 1 TABLET BY MOUTH EVERY DAY   metFORMIN (GLUCOPHAGE) 1000 MG tablet TAKE 1 TABLET BY MOUTH TWICE DAILY WITH A MEAL   rosuvastatin (CRESTOR) 10 MG tablet TAKE 1 TABLET BY MOUTH EVERY DAY   TRADJENTA 5 MG TABS tablet TAKE 1 TABLET BY MOUTH EVERY DAY   No facility-administered encounter medications on file as of 06/28/2022.    Allergies (verified) Patient has no known allergies.   History: Past Medical History:  Diagnosis Date   Anemia    Anxiety state, unspecified    Arthritis    KNEES   B12 deficiency anemia 05/2014 dx   CIN I (cervical intraepithelial neoplasia I)    Depressive disorder, not elsewhere classified    Diabetes mellitus without complication ([place]    Emphysema of lung ([place]    Hypertension    Internal hemorrhoids    Other and unspecified hyperlipidemia    Stomach ulcer    Unspecified sleep apnea    Past Surgical History:  Procedure Laterality Date   cataract surg Right    CERVICAL CERCLAGE     CESAREAN SECTION     COLONOSCOPY     HEMORRHOID SURGERY     KNEE ARTHROSCOPY Left 12/2014  UPPER GASTROINTESTINAL ENDOSCOPY     Family History  Problem Relation Age of Onset   Heart attack Father 46   Diabetes Father    Cancer Father        lung   Heart disease Father    Multiple sclerosis Mother    Breast cancer Neg Hx    Colon cancer Neg Hx    Esophageal cancer Neg Hx    Pancreatic cancer Neg Hx    Social History   Socioeconomic History   Marital status: Divorced    Spouse name: Not on file   Number of children: 3   Years of education: 14   Highest education level: Not on file  Occupational History   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  Tobacco Use   Smoking status: Former    Packs/day: 1.00    Years: 30.00    Total pack years: 30.00    Types: Cigarettes   Smokeless tobacco: Never  Vaping Use    Vaping Use: Never used  Substance and Sexual Activity   Alcohol use: Yes    Alcohol/week: 6.0 standard drinks of alcohol    Types: 6 Glasses of wine per week    Comment: 6 glasses or wine each week   Drug use: No   Sexual activity: Not Currently    Birth control/protection: Post-menopausal    Comment: 1st intercourse 71 yo-Fewer than 5 partners  Other Topics Concern   Not on file  Social History Narrative   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 BA- psych from [employer]. Married - 14 years -divorced. 3 daughters - twins 67 - one dtr,Charlotte, died March 17, 2012 unknown causes, 67   Work: Biomedical engineer substance abuse Social worker. No h/o physical or sexual abuse   Social Determinants of Radio broadcast assistant Strain: Not on file  Food Insecurity: Not on file  Transportation Needs: Not on file  Physical Activity: Not on file  Stress: Not on file  Social Connections: Not on file    Tobacco Counseling Counseling given: Not Answered   Clinical Intake:  Pre-visit preparation completed: Yes  Pain : No/denies pain     Nutritional Risks: None Diabetes: Yes CBG done?: No CBG resulted in Enter/ Edit results?: No Did pt. bring in CBG monitor from home?: No  How often do you need to have someone help you when you read instructions, pamphlets, or other written materials from your doctor or pharmacy?: 1 - Never  Diabetic?yes Nutrition Risk Assessment:  Has the patient had any N/V/D within the last 2 months?  No  Does the patient have any non-healing wounds?  No  Has the patient had any unintentional weight loss or weight gain?  No   Diabetes:  Is the patient diabetic?  No  If diabetic, was a CBG obtained today?  No  Did the patient bring in their glucometer from home?  No  How often do you monitor your CBG's? N/A.   Financial Strains and Diabetes Management:  Are you having any financial strains with the device, your supplies or your  medication? No .  Does the patient want to be seen by Chronic Care Management for management of their diabetes?  No  Would the patient like to be referred to a Nutritionist or for Diabetic Management?  No   Diabetic Exams:  Diabetic Eye Exam: Overdue for diabetic eye exam. Pt has been advised about the importance in completing this exam. Patient  advised to call and schedule an eye exam. Diabetic Foot Exam: Overdue, Pt has been advised about the importance in completing this exam. Pt is scheduled for diabetic foot exam on N/A.   Interpreter Needed?: No  Information entered by :: [place] of Daily Living    06/28/2022    9:09 AM  In your present state of health, do you have any difficulty performing the following activities:  Hearing? 0  Vision? 0  Difficulty concentrating or making decisions? 0  Walking or climbing stairs? 1  Dressing or bathing? 0  Doing errands, shopping? 0  Preparing Food and eating ? N  Using the Toilet? N  In the past six months, have you accidently leaked urine? Y  Do you have problems with loss of bowel control? Y  Managing your Medications? N  Managing your Finances? N  Housekeeping or managing your Housekeeping? N    Patient Care Team: Marrian Salvage, FNP as PCP - General (Internal Medicine) Elveria Rising, MD (Obstetrics and Gynecology) Lafayette Dragon, MD (Inactive) (Gastroenterology) Minus Breeding, MD as Consulting Physician (Cardiology) Rigoberto Noel, MD as Consulting Physician (Pulmonary Disease)  Indicate any recent Medical Services you may have received from other than [facility] providers in the past year (date may be approximate).     Assessment:   This is a routine wellness examination for Meredith [name].  Hearing/Vision screen No results found.  Dietary issues and exercise activities discussed: Current Exercise Habits: Home exercise routine, Type of exercise: walking, Time (Minutes): 20, Frequency (Times/Week): 7,  Weekly Exercise (Minutes/Week): 140, Intensity: Mild, Exercise limited by: None identified   Goals Addressed             This Visit's Progress    increase my physical activity and eat healthier   On track    Do chair exercises at work and make healthier food choices.       Depression Screen    06/28/2022    9:06 AM 11/02/2021    3:12 PM 11/20/2018   12:38 PM 02/10/2018    5:40 PM 09/19/2017    3:46 PM 11/29/2016    8:53 AM 03/15/2016    6:06 PM  PHQ 2/9 Scores  PHQ - 2 Score 0 0 0 0 3 0 1  PHQ- 9 Score     5      Fall Risk    06/28/2022    9:05 AM 11/02/2021    3:12 PM 10/31/2020    9:22 AM 11/20/2018   12:38 PM 02/10/2018    5:40 PM  [place] in the past year? 1 0 1 0 No  Number falls in past yr: 1 0 0    Injury with Fall? 0 0 0    Risk for fall due to : History of fall(s);Impaired balance/gait No Fall Risks     Follow up Falls evaluation completed Falls evaluation completed       [place]:  Any stairs in or around the home? No  If so, are there any without handrails?  N/A Home free of loose throw rugs in walkways, pet beds, electrical cords, etc? Yes  Adequate lighting in your home to reduce risk of falls? Yes   ASSISTIVE DEVICES UTILIZED TO PREVENT FALLS:  Life alert? No  Use of a cane, walker or w/c? No  Grab bars in the bathroom? No  Shower chair or bench in shower? No  Elevated toilet seat  or a handicapped toilet? No   TIMED UP AND GO:  Was the test performed? No .    Cognitive Function:        06/28/2022    9:13 AM  6CIT Screen  What Year? 0 points  What month? 0 points  What time? 0 points  Count back from 20 0 points  Months in reverse 0 points  Repeat phrase 0 points  Total Score 0 points    Immunizations Immunization History  Administered Date(s) Administered   Fluad Quad(high Dose 65+) 10/18/2019   Influenza Split 11/19/2012   Influenza Whole 11/02/2007, 10/26/2009   Influenza, High  Dose Seasonal PF 11/16/2015, 08/30/2016, 09/19/2017, 09/04/2018   Influenza,inj,Quad PF,6+ Mos 10/09/2013, 10/13/2014, 11/03/2014   Influenza-Unspecified 09/30/2020   PFIZER(Purple Top)SARS-COV-2 Vaccination 02/27/2020, 03/28/2020, 10/26/2020   Pneumococcal Conjugate-13 11/02/2007, 12/19/2015   Pneumococcal Polysaccharide-23 11/02/2007, 12/18/2016   Tdap 09/23/2012   Zoster Recombinat (Shingrix) 09/12/2018, 11/15/2018   Zoster, Live 09/23/2012    TDAP status: Up to date  Flu Vaccine status: Due, Education has been provided regarding the importance of this vaccine. Advised may receive this vaccine at local pharmacy or Health Dept. Aware to provide a copy of the vaccination record if obtained from local pharmacy or Health Dept. Verbalized acceptance and understanding.  Pneumococcal vaccine status: Up to date  Covid-19 vaccine status: Information provided on how to obtain vaccines.   Qualifies for Shingles Vaccine? Yes   Zostavax completed No   Shingrix Completed?: Yes  Screening Tests Health Maintenance  Topic Date Due   OPHTHALMOLOGY EXAM  12/12/2019   FOOT EXAM  11/16/2020   COVID-19 Vaccine (4 - Pfizer series) 12/21/2020   INFLUENZA VACCINE  07/30/2022   HEMOGLOBIN A1C  09/07/2022   TETANUS/TDAP  09/23/2022   MAMMOGRAM  09/26/2023   COLONOSCOPY (Pts 45-70yr Insurance coverage will need to be confirmed)  01/23/2031   Pneumonia Vaccine 72 Years old  Completed   DEXA SCAN  Completed   Hepatitis C Screening  Completed   Zoster Vaccines- Shingrix  Completed   HPV VACCINES  Aged Out    Health Maintenance  Health Maintenance Due  Topic Date Due   OPHTHALMOLOGY EXAM  12/12/2019   FOOT EXAM  11/16/2020   COVID-19 Vaccine (4 - Pfizer series) 12/21/2020    Colorectal cancer screening: Type of screening: Colonoscopy. Completed 12/05/21. Repeat every 10 years  Mammogram status: Completed 09/25/21. Repeat every year  Bone Density status: Completed 01/04/22. Results reflect:  Bone density results: OSTEOPOROSIS. Repeat every 2 years.  Lung Cancer Screening: (Low Dose CT Chest recommended if Age 459-80years, 30 pack-year currently smoking OR have quit w/in 15years.) does not qualify.   Lung Cancer Screening Referral: N/A  Additional Screening:  Hepatitis C Screening: does qualify; Completed 02/27/16  Vision Screening: Recommended annual ophthalmology exams for early detection of glaucoma and other disorders of the eye. Is the patient up to date with their annual eye exam?  Yes  Who is the provider or what is the name of the office in which the patient attends annual eye exams? Dr. HHerbert DeanerIf pt is not established with a provider, would they like to be referred to a provider to establish care? No .   Dental Screening: Recommended annual dental exams for proper oral hygiene  Community Resource Referral / Chronic Care Management: CRR required this visit?  No   CCM required this visit?  No      Plan:     I have personally reviewed and  noted the following in the patient's chart:   Medical and social history Use of alcohol, tobacco or illicit drugs  Current medications and supplements including opioid prescriptions. Patient is not currently taking opioid prescriptions. Functional ability and status Nutritional status Physical activity Advanced directives List of other physicians Hospitalizations, surgeries, and ER visits in previous 12 months Vitals Screenings to include cognitive, depression, and falls Referrals and appointments  In addition, I have reviewed and discussed with patient certain preventive protocols, quality metrics, and best practice recommendations. A written personalized care plan for preventive services as well as general preventive health recommendations were provided to patient.   Due to this being a telephonic visit, the after visit summary with patients personalized plan was offered to patient via mail or my-chart. Patient would  like to access on my-chart.   Duard Brady Santiago Stenzel, [place]   06/28/2022   Nurse Notes: none    Medical screening examination/treatment/procedure(s) were performed by non-physician practitioner and as supervising provider I was immediately available for consultation/collaboration.  I agree with above. Marrian Salvage, FNP

## 2022-06-28 NOTE — Patient Instructions (Signed)
Meredith [name] , Thank you for taking time to come for your Medicare Wellness Visit. I appreciate your ongoing commitment to your health goals. Please review the following plan we discussed and let me know if I can assist you in the future.   Screening recommendations/referrals: Colonoscopy: 12/05/21 due 12/06/31 Mammogram: 09/25/21 due 09/25/22 Bone Density: 01/24/22 due 01/25/24 Recommended yearly ophthalmology/optometry visit for glaucoma screening and checkup Recommended yearly dental visit for hygiene and checkup  Vaccinations: Influenza vaccine: Due-May obtain vaccine at our office or your local pharmacy.  Pneumococcal vaccine: up to date Tdap vaccine: up to date Shingles vaccine: up to date   Covid-19:Due-May obtain vaccine at your local pharmacy.   Advanced directives: yes, not on file  Conditions/risks identified: see problem list   Next appointment: Follow up in one year for your annual wellness visit    Preventive Care 65 Years and Older, Female Preventive care refers to lifestyle choices and visits with your health care provider that can promote health and wellness. What does preventive care include? A yearly physical exam. This is also called an annual well check. Dental exams once or twice a year. Routine eye exams. Ask your health care provider how often you should have your eyes checked. Personal lifestyle choices, including: Daily care of your teeth and gums. Regular physical activity. Eating a healthy diet. Avoiding tobacco and drug use. Limiting alcohol use. Practicing safe sex. Taking low-dose aspirin every day. Taking vitamin and mineral supplements as recommended by your health care provider. What happens during an annual well check? The services and screenings done by your health care provider during your annual well check will depend on your age, overall health, lifestyle risk factors, and family history of disease. Counseling  Your health care provider may ask  you questions about your: Alcohol use. Tobacco use. Drug use. Emotional well-being. Home and relationship well-being. Sexual activity. Eating habits. History of falls. Memory and ability to understand (cognition). Work and work Statistician. Reproductive health. Screening  You may have the following tests or measurements: Height, weight, and BMI. Blood pressure. Lipid and cholesterol levels. These may be checked every 5 years, or more frequently if you are over 62 years old. Skin check. Lung cancer screening. You may have this screening every year starting at age 30 if you have a 30-pack-year history of smoking and currently smoke or have quit within the past 15 years. Fecal occult blood test (FOBT) of the stool. You may have this test every year starting at age 34. Flexible sigmoidoscopy or colonoscopy. You may have a sigmoidoscopy every 5 years or a colonoscopy every 10 years starting at age 32. Hepatitis C blood test. Hepatitis B blood test. Sexually transmitted disease (STD) testing. Diabetes screening. This is done by checking your blood sugar (glucose) after you have not eaten for a while (fasting). You may have this done every 1-3 years. Bone density scan. This is done to screen for osteoporosis. You may have this done starting at age 56. Mammogram. This may be done every 1-2 years. Talk to your health care provider about how often you should have regular mammograms. Talk with your health care provider about your test results, treatment options, and if necessary, the need for more tests. Vaccines  Your health care provider may recommend certain vaccines, such as: Influenza vaccine. This is recommended every year. Tetanus, diphtheria, and acellular pertussis (Tdap, Td) vaccine. You may need a Td booster every 10 years. Zoster vaccine. You may need this after age 49.  Pneumococcal 13-valent conjugate (PCV13) vaccine. One dose is recommended after age 64. Pneumococcal  polysaccharide (PPSV23) vaccine. One dose is recommended after age 49. Talk to your health care provider about which screenings and vaccines you need and how often you need them. This information is not intended to replace advice given to you by your health care provider. Make sure you discuss any questions you have with your health care provider. Document Released: 01/12/2016 Document Revised: 09/04/2016 Document Reviewed: 10/17/2015 Elsevier Interactive Patient Education  2017 [place] Prevention in the Home Falls can cause injuries. They can happen to people of all ages. There are many things you can do to make your home safe and to help prevent falls. What can I do on the outside of my home? Regularly fix the edges of walkways and driveways and fix any cracks. Remove anything that might make you trip as you walk through a door, such as a raised step or threshold. Trim any bushes or trees on the path to your home. Use bright outdoor lighting. Clear any walking paths of anything that might make someone trip, such as rocks or tools. Regularly check to see if handrails are loose or broken. Make sure that both sides of any steps have handrails. Any raised decks and porches should have guardrails on the edges. Have any leaves, snow, or ice cleared regularly. Use sand or salt on walking paths during winter. Clean up any spills in your garage right away. This includes oil or grease spills. What can I do in the bathroom? Use night lights. Install grab bars by the toilet and in the tub and shower. Do not use towel bars as grab bars. Use non-skid mats or decals in the tub or shower. If you need to sit down in the shower, use a plastic, non-slip stool. Keep the floor dry. Clean up any water that spills on the floor as soon as it happens. Remove soap buildup in the tub or shower regularly. Attach bath mats securely with double-sided non-slip rug tape. Do not have throw rugs and other  things on the floor that can make you trip. What can I do in the bedroom? Use night lights. Make sure that you have a light by your bed that is easy to reach. Do not use any sheets or blankets that are too big for your bed. They should not hang down onto the floor. Have a firm chair that has side arms. You can use this for support while you get dressed. Do not have throw rugs and other things on the floor that can make you trip. What can I do in the kitchen? Clean up any spills right away. Avoid walking on wet floors. Keep items that you use a lot in easy-to-reach places. If you need to reach something above you, use a strong step stool that has a grab bar. Keep electrical cords out of the way. Do not use floor polish or wax that makes floors slippery. If you must use wax, use non-skid floor wax. Do not have throw rugs and other things on the floor that can make you trip. What can I do with my stairs? Do not leave any items on the stairs. Make sure that there are handrails on both sides of the stairs and use them. Fix handrails that are broken or loose. Make sure that handrails are as long as the stairways. Check any carpeting to make sure that it is firmly attached to the stairs. Fix any  carpet that is loose or worn. Avoid having throw rugs at the top or bottom of the stairs. If you do have throw rugs, attach them to the floor with carpet tape. Make sure that you have a light switch at the top of the stairs and the bottom of the stairs. If you do not have them, ask someone to add them for you. What else can I do to help prevent falls? Wear shoes that: Do not have high heels. Have rubber bottoms. Are comfortable and fit you well. Are closed at the toe. Do not wear sandals. If you use a stepladder: Make sure that it is fully opened. Do not climb a closed stepladder. Make sure that both sides of the stepladder are locked into place. Ask someone to hold it for you, if possible. Clearly  mark and make sure that you can see: Any grab bars or handrails. First and last steps. Where the edge of each step is. Use tools that help you move around (mobility aids) if they are needed. These include: Canes. Walkers. Scooters. Crutches. Turn on the lights when you go into a dark area. Replace any light bulbs as soon as they burn out. Set up your furniture so you have a clear path. Avoid moving your furniture around. If any of your floors are uneven, fix them. If there are any pets around you, be aware of where they are. Review your medicines with your doctor. Some medicines can make you feel dizzy. This can increase your chance of falling. Ask your doctor what other things that you can do to help prevent falls. This information is not intended to replace advice given to you by your health care provider. Make sure you discuss any questions you have with your health care provider. Document Released: 10/12/2009 Document Revised: 05/23/2016 Document Reviewed: 01/20/2015 Elsevier Interactive Patient Education  2017 [employer].

## 2022-07-04 ENCOUNTER — Ambulatory Visit: Payer: Medicare Other | Admitting: Physician Assistant

## 2022-07-13 ENCOUNTER — Other Ambulatory Visit: Payer: Self-pay | Admitting: Family

## 2022-07-13 DIAGNOSIS — I10 Essential (primary) hypertension: Secondary | ICD-10-CM

## 2022-08-01 ENCOUNTER — Other Ambulatory Visit: Payer: Self-pay | Admitting: Family

## 2022-08-21 NOTE — Progress Notes (Unsigned)
  06/05/20- 80 yoF former smoker to establish for sleep evaluation. Had been followed for OSA by Dr Elsworth Soho, last seen in 2017. Medical problem list includes Osteoarthritis, Depression, DM2, Hyperlipidemia, Lung Nodule, Working as substance abuse counselor. Divorced, lives alone NPSG 03/29/04- AHI 51.4/ hr, 78%, body weight 207 lbs CPAP 12/ [facility]. Machine is 72 yrs old, nasal pillows Download compliance 93%, AHI 2.7/ hr Body weight today 223 lbs Epworth score 6 Meds include alprazolam 0.5,  Bedtime 12MN-1:30 AM, Up 7:30-8AM. CT low dose screening program. DOE stable. Denies cough or wheeze and doesn't feel she needs inhaler.  PFT in 2015 showed only minimal reduction of Diffusion.  CT chest-08/17/2019- IMPRESSION: 1. Lung-RADS 2, benign appearance or behavior. Continue annual screening with low-dose chest CT without contrast in 12 months. 2. Aortic atherosclerosis (ICD10-170.0). Coronary artery calcification. 3.  Emphysema (ICD10-J43.9).  08/22/22- 71 yoF former smoker followed for OSA, complicated by  Aortic Atherosclerosis, HTN, COPD, Pulmonary Nodule, DM2, Osteoarthritis, Hyperlipidemia, FEAnemia,   NPSG 03/29/04- AHI 51.4/ hr, 78%, body weight 207 lbs CPAP 12/ [facility]) Download compliance- Body weight today- Covid vax-   CXR 06/02/22- IMPRESSION: No active cardiopulmonary disease. CT chest lung cancer screening-12/13/21- IMPRESSION: 1. Lung-RADS 2, benign appearance or behavior. Continue annual screening with low-dose chest CT without contrast in 12 months. 2. Aortic Atherosclerosis (ICD10-I70.0).

## 2022-08-22 ENCOUNTER — Encounter: Payer: Self-pay | Admitting: Internal Medicine

## 2022-08-22 ENCOUNTER — Ambulatory Visit (INDEPENDENT_AMBULATORY_CARE_PROVIDER_SITE_OTHER): Payer: Medicare Other | Admitting: Internal Medicine

## 2022-08-22 VITALS — BP 128/60 | HR 67 | Ht 64.0 in | Wt 196.0 lb

## 2022-08-22 DIAGNOSIS — J438 Other emphysema: Secondary | ICD-10-CM | POA: Diagnosis not present

## 2022-08-22 DIAGNOSIS — R918 Other nonspecific abnormal finding of lung field: Secondary | ICD-10-CM

## 2022-08-22 DIAGNOSIS — G4733 Obstructive sleep apnea (adult) (pediatric): Secondary | ICD-10-CM

## 2022-08-22 NOTE — Assessment & Plan Note (Signed)
She continues low-dose screening chest CT program.

## 2022-08-22 NOTE — Assessment & Plan Note (Signed)
Stable currently.  Discussed status without medication. Plan-watchful oral possible long-term evolution and possibly development of more chronic bronchitis symptoms that could respond to medication if needed.

## 2022-08-22 NOTE — Patient Instructions (Signed)
Order- DME [facility])   please replace old CPAP machine, change to auto 5-15, mask of choice, humidifier, supplies, AirView/card  As discussed, if you find you are having more breathing problems we can help, just let us know.

## 2022-08-22 NOTE — Assessment & Plan Note (Signed)
Benefits from CPAP with improved sleep Plan-replace old machine, changing to auto 5-15

## 2022-08-23 ENCOUNTER — Other Ambulatory Visit: Payer: Self-pay | Admitting: Family

## 2022-08-23 DIAGNOSIS — Z1231 Encounter for screening mammogram for malignant neoplasm of breast: Secondary | ICD-10-CM

## 2022-09-05 ENCOUNTER — Inpatient Hospital Stay: Payer: Medicare Other | Attending: Hematology and Oncology

## 2022-09-05 ENCOUNTER — Other Ambulatory Visit: Payer: Self-pay

## 2022-09-05 DIAGNOSIS — E538 Deficiency of other specified B group vitamins: Secondary | ICD-10-CM | POA: Diagnosis not present

## 2022-09-05 DIAGNOSIS — D509 Iron deficiency anemia, unspecified: Secondary | ICD-10-CM

## 2022-09-05 DIAGNOSIS — E119 Type 2 diabetes mellitus without complications: Secondary | ICD-10-CM

## 2022-09-05 LAB — CBC WITH DIFFERENTIAL (CANCER CENTER ONLY)
Abs Immature Granulocytes: 0.02 10*3/uL (ref 0.00–0.07)
Basophils Absolute: 0.1 10*3/uL (ref 0.0–0.1)
Basophils Relative: 2 %
Eosinophils Absolute: 0.2 10*3/uL (ref 0.0–0.5)
Eosinophils Relative: 4 %
HCT: 35.5 % — ABNORMAL LOW (ref 36.0–46.0)
Hemoglobin: 11.8 g/dL — ABNORMAL LOW (ref 12.0–15.0)
Immature Granulocytes: 0 %
Lymphocytes Relative: 27 %
Lymphs Abs: 1.6 10*3/uL (ref 0.7–4.0)
MCH: 30.6 pg (ref 26.0–34.0)
MCHC: 33.2 g/dL (ref 30.0–36.0)
MCV: 92 fL (ref 80.0–100.0)
Monocytes Absolute: 0.4 10*3/uL (ref 0.1–1.0)
Monocytes Relative: 6 %
Neutro Abs: 3.8 10*3/uL (ref 1.7–7.7)
Neutrophils Relative %: 61 %
Platelet Count: 246 10*3/uL (ref 150–400)
RBC: 3.86 MIL/uL — ABNORMAL LOW (ref 3.87–5.11)
RDW: 13.3 % (ref 11.5–15.5)
WBC Count: 6.1 10*3/uL (ref 4.0–10.5)
nRBC: 0 % (ref 0.0–0.2)

## 2022-09-05 LAB — IRON AND IRON BINDING CAPACITY (CC-WL,HP ONLY)
Iron: 50 ug/dL (ref 28–170)
Saturation Ratios: 14 % (ref 10.4–31.8)
TIBC: 360 ug/dL (ref 250–450)
UIBC: 310 ug/dL (ref 148–442)

## 2022-09-05 LAB — HEMOGLOBIN A1C
Hgb A1c MFr Bld: 6.2 % — ABNORMAL HIGH (ref 4.8–5.6)
Mean Plasma Glucose: 131.24 mg/dL

## 2022-09-06 ENCOUNTER — Inpatient Hospital Stay (HOSPITAL_BASED_OUTPATIENT_CLINIC_OR_DEPARTMENT_OTHER): Payer: Medicare Other | Admitting: Hematology and Oncology

## 2022-09-06 ENCOUNTER — Encounter: Payer: Self-pay | Admitting: Hematology and Oncology

## 2022-09-06 DIAGNOSIS — D509 Iron deficiency anemia, unspecified: Secondary | ICD-10-CM

## 2022-09-06 DIAGNOSIS — E119 Type 2 diabetes mellitus without complications: Secondary | ICD-10-CM

## 2022-09-06 DIAGNOSIS — D519 Vitamin B12 deficiency anemia, unspecified: Secondary | ICD-10-CM | POA: Diagnosis not present

## 2022-09-06 DIAGNOSIS — E559 Vitamin D deficiency, unspecified: Secondary | ICD-10-CM

## 2022-09-06 LAB — FERRITIN: Ferritin: 64 ng/mL (ref 11–307)

## 2022-09-06 NOTE — Assessment & Plan Note (Signed)
Her hemoglobin A1c is slightly worse compared to before but still looking quite good We discussed importance of dietary modification and lifestyle changes

## 2022-09-06 NOTE — Progress Notes (Signed)
HEMATOLOGY-ONCOLOGY ELECTRONIC VISIT PROGRESS NOTE  Patient Care Team: Marrian Salvage, [place] as PCP - General (Internal Medicine) Elveria Rising, MD (Obstetrics and Gynecology) Lafayette Dragon, MD (Inactive) (Gastroenterology) Minus Breeding, MD as Consulting Physician (Cardiology) Rigoberto Noel, MD as Consulting Physician (Pulmonary Disease)  I connected with the patient via telephone conference and verified that I am speaking with the correct person using two identifiers. The patient's location is at home and I am providing care from the [facility] I discussed the limitations, risks, security and privacy concerns of performing an evaluation and management service by e-visits and the availability of in person appointments.  I also discussed with the patient that there may be a patient responsible charge related to this service. The patient expressed understanding and agreed to proceed.   ASSESSMENT & PLAN:  Iron deficiency anemia She is symptomatic from her anemia Her iron studies indicated that her ferritin level has dropped We discussed the risk and benefits of intravenous iron infusion and ultimately, the patient choose to proceed with IV iron  The most likely cause of her anemia is due to chronic blood loss/malabsorption syndrome. We discussed some of the risks, benefits, and alternatives of intravenous iron infusions. The patient is symptomatic from anemia and the iron level is critically low. She tolerated oral iron supplement poorly and desires to achieved higher levels of iron faster for adequate hematopoesis. Some of the side-effects to be expected including risks of infusion reactions, phlebitis, headaches, nausea and fatigue.  The patient is willing to proceed. Patient education material was dispensed.  Goal is to keep ferritin level greater than 50 and resolution of anemia I recommend 1 dose of intravenous iron sucrose 400 mg and plan to recheck in 6 months and she is  in agreement   B12 deficiency anemia Repeat B12 level is adequate I recommend she continue on oral vitamin B12 supplement  Diabetes mellitus type 2, controlled, without complications (HCC) Her hemoglobin A1c is slightly worse compared to before but still looking quite good We discussed importance of dietary modification and lifestyle changes  Orders Placed This Encounter  Procedures   CBC with Differential/Platelet    Standing Status:   Future    Standing Expiration Date:   09/07/2023   Iron and Iron Binding Capacity ([facility] only)    Standing Status:   Future    Standing Expiration Date:   09/07/2023   Ferritin    Standing Status:   Future    Standing Expiration Date:   09/06/2023   Reticulocytes    Standing Status:   Future    Standing Expiration Date:   09/07/2023   Vitamin B12    Standing Status:   Future    Standing Expiration Date:   09/07/2023    INTERVAL HISTORY: Please see below for problem oriented charting. The purpose of today's discussion is to review recent test results The patient had prior history of multiple mineral deficiencies She complained of excessive fatigue The patient denies any recent signs or symptoms of bleeding such as spontaneous epistaxis, hematuria or hematochezia.  SUMMARY OF HEMATOLOGIC HISTORY:  She was found to have abnormal CBC from recent blood work I have the opportunity to review her CBC dated back to June 2015 Hemoglobin ranged from 10.3-11.9 Her most recent blood work show evidence of severe iron deficiency She had EGD and colonoscopy performed in 2015 which showed no evidence of GI bleed She denies recent chest pain on exertion, but does complain of shortness  of breath on minimal exertion and excessive fatigue. She had not noticed any recent bleeding such as epistaxis but did states she has occasional hemorrhoidal bleeding once a month She had taken NSAID in the past for chronic joint pain but nothing recently.  She is on chronic aspirin  therapy  She had history of abnormal PAP smear but no prior history of cancer. Her age appropriate screening programs are up-to-date. She eats a variety of diet She used to donate blood a lot in the past but nothing in the last 20 years She has pica and craves ice The patient was prescribed oral iron supplements and she takes in the past but that cause significant constipation Between December 2021 to February 2022, she received multiple doses of IV iron; she received her final dose of IV iron in May 2022  REVIEW OF SYSTEMS:   Constitutional: Denies fevers, chills or abnormal weight loss Eyes: Denies blurriness of vision Ears, nose, mouth, throat, and face: Denies mucositis or sore throat Respiratory: Denies cough, dyspnea or wheezes Cardiovascular: Denies palpitation, chest discomfort Gastrointestinal:  Denies nausea, heartburn or change in bowel habits Skin: Denies abnormal skin rashes Lymphatics: Denies new lymphadenopathy or easy bruising Neurological:Denies numbness, tingling or new weaknesses Behavioral/Psych: Mood is stable, no new changes  Extremities: No lower extremity edema All other systems were reviewed with the patient and are negative.  I have reviewed the past medical history, past surgical history, social history and family history with the patient and they are unchanged from previous note.  ALLERGIES:  has No Known Allergies.  MEDICATIONS:  Current Outpatient Medications  Medication Sig Dispense Refill   ALPRAZolam (XANAX) 0.5 MG tablet Take 1 tablet (0.5 mg total) by mouth 2 (two) times daily as needed for anxiety. 60 tablet 0   amLODipine (NORVASC) 2.5 MG tablet TAKE 1 TABLET BY MOUTH EVERY DAY 90 tablet 1   aspirin 81 MG tablet Take 81 mg by mouth daily.     escitalopram (LEXAPRO) 20 MG tablet TAKE 1 TABLET BY MOUTH EVERY DAY 90 tablet 3   furosemide (LASIX) 20 MG tablet TAKE 1 TABLET BY MOUTH TWICE A DAY 180 tablet 0   irbesartan (AVAPRO) 300 MG tablet TAKE 1  TABLET BY MOUTH EVERY DAY 90 tablet 1   metFORMIN (GLUCOPHAGE) 1000 MG tablet TAKE 1 TABLET BY MOUTH TWICE DAILY WITH A MEAL 180 tablet 0   rosuvastatin (CRESTOR) 10 MG tablet TAKE 1 TABLET BY MOUTH EVERY DAY 90 tablet 1   TRADJENTA 5 MG TABS tablet TAKE 1 TABLET BY MOUTH EVERY DAY 90 tablet 3   No current facility-administered medications for this visit.    PHYSICAL EXAMINATION: ECOG PERFORMANCE STATUS: 1 - Symptomatic but completely ambulatory  LABORATORY DATA:  I have reviewed the data as listed    Latest Ref Rng & Units 06/03/2022    2:45 AM 06/02/2022    9:07 PM 11/02/2021    4:02 PM  CMP  Glucose 70 - 99 mg/dL  207  114   BUN 8 - 23 mg/dL  19  10   Creatinine 0.44 - 1.00 mg/dL  0.90  0.78   Sodium 135 - 145 mmol/L  138  141   Potassium 3.5 - 5.1 mmol/L  4.2  3.8   Chloride 98 - 111 mmol/L  101  103   CO2 22 - 32 mmol/L  25  28   Calcium 8.9 - 10.3 mg/dL  9.4  9.5   Total Protein 6.5 - 8.1  g/dL 6.4   6.9   Total Bilirubin 0.3 - 1.2 mg/dL 0.3   0.4   Alkaline Phos 38 - 126 U/L 59     AST 15 - 41 U/L 20   19   ALT 0 - 44 U/L 24   17     Lab Results  Component Value Date   WBC 6.1 09/05/2022   HGB 11.8 (L) 09/05/2022   HCT 35.5 (L) 09/05/2022   MCV 92.0 09/05/2022   PLT 246 09/05/2022   NEUTROABS 3.8 09/05/2022     I discussed the assessment and treatment plan with the patient. The patient was provided an opportunity to ask questions and all were answered. The patient agreed with the plan and demonstrated an understanding of the instructions. The patient was advised to call back or seek an in-person evaluation if the symptoms worsen or if the condition fails to improve as anticipated.    I spent 30 minutes for the appointment reviewing test results, discuss management and coordination of care.  Heath Lark, MD 09/06/2022 2:39 PM

## 2022-09-06 NOTE — Assessment & Plan Note (Signed)
Repeat B12 level is adequate I recommend she continue on oral vitamin B12 supplement

## 2022-09-06 NOTE — Assessment & Plan Note (Deleted)
She is on high-dose vitamin D replacement therapy

## 2022-09-06 NOTE — Assessment & Plan Note (Signed)
She is symptomatic from her anemia Her iron studies indicated that her ferritin level has dropped We discussed the risk and benefits of intravenous iron infusion and ultimately, the patient choose to proceed with IV iron  The most likely cause of her anemia is due to chronic blood loss/malabsorption syndrome. We discussed some of the risks, benefits, and alternatives of intravenous iron infusions. The patient is symptomatic from anemia and the iron level is critically low. She tolerated oral iron supplement poorly and desires to achieved higher levels of iron faster for adequate hematopoesis. Some of the side-effects to be expected including risks of infusion reactions, phlebitis, headaches, nausea and fatigue.  The patient is willing to proceed. Patient education material was dispensed.  Goal is to keep ferritin level greater than 50 and resolution of anemia I recommend 1 dose of intravenous iron sucrose 400 mg and plan to recheck in 6 months and she is in agreement

## 2022-09-09 ENCOUNTER — Telehealth: Payer: Self-pay | Admitting: Hematology and Oncology

## 2022-09-09 NOTE — Telephone Encounter (Signed)
Scheduled per 9/8 in basket, message has been left

## 2022-09-19 ENCOUNTER — Inpatient Hospital Stay: Payer: Medicare Other

## 2022-09-19 ENCOUNTER — Other Ambulatory Visit: Payer: Self-pay | Admitting: Hematology and Oncology

## 2022-09-19 ENCOUNTER — Other Ambulatory Visit: Payer: Self-pay

## 2022-09-19 VITALS — BP 127/63 | HR 65 | Temp 98.4°F | Resp 17

## 2022-09-19 DIAGNOSIS — D509 Iron deficiency anemia, unspecified: Secondary | ICD-10-CM | POA: Diagnosis not present

## 2022-09-19 DIAGNOSIS — E119 Type 2 diabetes mellitus without complications: Secondary | ICD-10-CM | POA: Diagnosis not present

## 2022-09-19 DIAGNOSIS — E538 Deficiency of other specified B group vitamins: Secondary | ICD-10-CM | POA: Diagnosis not present

## 2022-09-19 DIAGNOSIS — D519 Vitamin B12 deficiency anemia, unspecified: Secondary | ICD-10-CM

## 2022-09-19 MED ORDER — LORATADINE 10 MG PO TABS
10.0000 mg | ORAL_TABLET | Freq: Every day | ORAL | Status: DC
  Administered 2022-09-19: 10 mg via ORAL
  Filled 2022-09-19: qty 1

## 2022-09-19 MED ORDER — SODIUM CHLORIDE 0.9 % IV SOLN: Freq: Once | INTRAVENOUS | Status: AC

## 2022-09-19 MED ORDER — DIPHENHYDRAMINE HCL 50 MG/ML IJ SOLN
25.0000 mg | Freq: Once | INTRAMUSCULAR | Status: AC
  Administered 2022-09-19: 25 mg via INTRAVENOUS
  Filled 2022-09-19: qty 1

## 2022-09-19 MED ORDER — FAMOTIDINE IN NACL 20-0.9 MG/50ML-% IV SOLN
20.0000 mg | Freq: Once | INTRAVENOUS | Status: AC
  Administered 2022-09-19: 20 mg via INTRAVENOUS
  Filled 2022-09-19: qty 50

## 2022-09-19 MED ORDER — SODIUM CHLORIDE 0.9 % IV SOLN
400.0000 mg | Freq: Once | INTRAVENOUS | Status: AC
  Administered 2022-09-19: 400 mg via INTRAVENOUS
  Filled 2022-09-19: qty 20

## 2022-09-19 NOTE — Patient Instructions (Signed)

## 2022-09-25 NOTE — Progress Notes (Signed)
71 y.o. [id number] Divorced Caucasian female here for breast & pelvic.    Dx with gall stones. She will see her gastroenterologist.   Taking Lexapro for a long time. Originally prescribed after her child passed away.  She does not have panic.  She has tried to wean off by taking every other day. Has not bee successful.  Did a recent iron infusion.   PCP:   Jodi Mourning, FNP  Patient's last menstrual period was 12/30/1998 (approximate).           Sexually active: No.  The current method of family planning is post menopausal status.    Exercising: No.   exercise Smoker:  no  Health Maintenance: Pap:  09-21-21 neg HPV HR neg, 02-22-19 neg HPV HR neg History of abnormal Pap:  yes colpo 2018 granulation tissue MMG:  09-26-22 category c density birads 1:neg Colonoscopy:  2022 sessile polyp removed. Endoscopy done BMD:  01-04-22 Result osteopenia of both hips.  PC following at [facility].  TDaP:  09-23-2012 Gardasil: no HIV: neg Hep C: neg 2017 Screening Labs:  PCP   reports that she has quit smoking. Her smoking use included cigarettes. She has a 30.00 pack-year smoking history. She has never used smokeless tobacco. She reports current alcohol use of about 6.0 standard drinks of alcohol per week. She reports that she does not use drugs.  Past Medical History:  Diagnosis Date   Anemia    Anxiety state, unspecified    Arthritis    KNEES   B12 deficiency anemia 05/2014 dx   CIN I (cervical intraepithelial neoplasia I)    Depressive disorder, not elsewhere classified    Diabetes mellitus without complication (HCC)    Emphysema of lung (HCC)    Gallstones    Hypertension    Internal hemorrhoids    Other and unspecified hyperlipidemia    Stomach ulcer    Unspecified sleep apnea     Past Surgical History:  Procedure Laterality Date   cataract surg Right    CERVICAL CERCLAGE     CESAREAN SECTION     COLONOSCOPY     HEMORRHOID SURGERY     KNEE ARTHROSCOPY Left 12/2014   UPPER  GASTROINTESTINAL ENDOSCOPY      Current Outpatient Medications  Medication Sig Dispense Refill   ALPRAZolam (XANAX) 0.5 MG tablet Take 1 tablet (0.5 mg total) by mouth 2 (two) times daily as needed for anxiety. 60 tablet 0   amLODipine (NORVASC) 2.5 MG tablet TAKE 1 TABLET BY MOUTH EVERY DAY 90 tablet 1   aspirin 81 MG tablet Take 81 mg by mouth daily.     escitalopram (LEXAPRO) 20 MG tablet TAKE 1 TABLET BY MOUTH EVERY DAY 90 tablet 3   furosemide (LASIX) 20 MG tablet TAKE 1 TABLET BY MOUTH TWICE A DAY 180 tablet 0   irbesartan (AVAPRO) 300 MG tablet TAKE 1 TABLET BY MOUTH EVERY DAY 90 tablet 1   metFORMIN (GLUCOPHAGE) 1000 MG tablet TAKE 1 TABLET BY MOUTH TWICE DAILY WITH A MEAL 180 tablet 0   rosuvastatin (CRESTOR) 10 MG tablet TAKE 1 TABLET BY MOUTH EVERY DAY 90 tablet 1   TRADJENTA 5 MG TABS tablet TAKE 1 TABLET BY MOUTH EVERY DAY 90 tablet 3   No current facility-administered medications for this visit.    Family History  Problem Relation Age of Onset   Multiple sclerosis Mother    Heart attack Father 34   Diabetes Father    Cancer Father  lung   Heart disease Father     Review of Systems  Constitutional: Negative.   HENT: Negative.    Eyes: Negative.   Respiratory: Negative.    Cardiovascular: Negative.   Gastrointestinal: Negative.   Endocrine: Negative.   Genitourinary: Negative.   Musculoskeletal: Negative.   Skin: Negative.   Allergic/Immunologic: Negative.   Neurological: Negative.   Hematological: Negative.   Psychiatric/Behavioral: Negative.      Exam:   BP 124/76   Pulse 71   Ht 5' 3.75" (1.619 m)   Wt 195 lb (88.5 kg)   LMP 12/30/1998 (Approximate)   SpO2 98%   BMI 33.73 kg/m     General appearance: alert, cooperative and appears stated age Head: normocephalic, without obvious abnormality, atraumatic Neck: no adenopathy, supple, symmetrical, trachea midline and thyroid normal to inspection and palpation Lungs: clear to auscultation  bilaterally Breasts: normal appearance, no masses or tenderness, No nipple retraction or dimpling, No nipple discharge or bleeding, No axillary adenopathy Heart: regular rate and rhythm Abdomen: soft, non-tender; no masses, no organomegaly Extremities: extremities normal, atraumatic, no cyanosis or edema Skin: skin color, texture, turgor normal. No rashes or lesions Lymph nodes: cervical, supraclavicular, and axillary nodes normal. Neurologic: grossly normal  Pelvic: External genitalia:  no lesions              No abnormal inguinal nodes palpated.              Urethra:  normal appearing urethra with no masses, tenderness or lesions              Bartholins and Skenes: normal                 Vagina: normal appearing vagina with normal color and discharge, no lesions              Cervix: no lesions.  Flush with vaginal cuff.               Pap taken: yes Bimanual Exam:  Uterus:  normal size, contour, position, consistency, mobility, non-tender              Adnexa: no mass, fullness, tenderness              Rectal exam: declined.   Chaperone was present for exam:  yes.  Assessment:   Well woman visit with gynecologic exam. High risk Medicare GYN exam. Hx cervical lesion.  Atypia on colpo biopsy in November. 2018. Hx prior LGSIL on colposcopy 2016.  Had positive HR HPV then. Hx potential DES exposure.  Hx preterm deliveries.  Cervix flush with vagina. Osteopenia.  Depression and anxiety.  On Lexapro long term.  Plan: Mammogram screening discussed. Self breast awareness reviewed. Pap and HR HPV collected. Guidelines for Calcium, Vitamin D, regular exercise program including cardiovascular and weight bearing exercise. We talked about weaning off of Lexapro slowly but taking it daily and potentially reducing by just 5 mg at a time for months.  I did recommend that she discuss weaning off this medication with her prescriber. BMD in [place]. 2025. Marland Kitchen  Follow up annually and prn.   After visit  summary provided.   27 min  total time was spent for this patient encounter, including preparation, face-to-face counseling with the patient, coordination of care, and documentation of the encounter.

## 2022-09-26 ENCOUNTER — Ambulatory Visit
Admission: RE | Admit: 2022-09-26 | Discharge: 2022-09-26 | Disposition: A | Discharge status: Home / Self Care | Payer: Medicare Other | Source: Ambulatory Visit | Attending: Family | Admitting: Family

## 2022-09-26 DIAGNOSIS — Z1231 Encounter for screening mammogram for malignant neoplasm of breast: Secondary | ICD-10-CM

## 2022-10-01 ENCOUNTER — Encounter: Payer: Self-pay | Admitting: Obstetrics and Gynecology

## 2022-10-01 ENCOUNTER — Other Ambulatory Visit (HOSPITAL_COMMUNITY)
Admission: RE | Admit: 2022-10-01 | Discharge: 2022-10-01 | Disposition: A | Discharge status: Home / Self Care | Payer: Medicare Other | Source: Ambulatory Visit | Attending: Obstetrics and Gynecology | Admitting: Obstetrics and Gynecology

## 2022-10-01 ENCOUNTER — Ambulatory Visit (INDEPENDENT_AMBULATORY_CARE_PROVIDER_SITE_OTHER): Payer: Medicare Other | Admitting: Obstetrics and Gynecology

## 2022-10-01 ENCOUNTER — Telehealth: Payer: Self-pay

## 2022-10-01 VITALS — BP 124/76 | HR 71 | Ht 63.75 in | Wt 195.0 lb

## 2022-10-01 DIAGNOSIS — F32A Depression, unspecified: Secondary | ICD-10-CM

## 2022-10-01 DIAGNOSIS — Z9189 Other specified personal risk factors, not elsewhere classified: Secondary | ICD-10-CM

## 2022-10-01 DIAGNOSIS — Z8742 Personal history of other diseases of the female genital tract: Secondary | ICD-10-CM

## 2022-10-01 DIAGNOSIS — Z9289 Personal history of other medical treatment: Secondary | ICD-10-CM | POA: Diagnosis not present

## 2022-10-01 DIAGNOSIS — Z124 Encounter for screening for malignant neoplasm of cervix: Secondary | ICD-10-CM

## 2022-10-01 DIAGNOSIS — F419 Anxiety disorder, unspecified: Secondary | ICD-10-CM | POA: Diagnosis not present

## 2022-10-01 DIAGNOSIS — Z01419 Encounter for gynecological examination (general) (routine) without abnormal findings: Secondary | ICD-10-CM

## 2022-10-01 DIAGNOSIS — Z1151 Encounter for screening for human papillomavirus (HPV): Secondary | ICD-10-CM | POA: Insufficient documentation

## 2022-10-01 NOTE — Patient Instructions (Addendum)
EXERCISE AND DIET:  We recommended that you start or continue a regular exercise program for good health. Regular exercise means any activity that makes your heart beat faster and makes you sweat.  We recommend exercising at least 30 minutes per day at least 3 days a week, preferably 4 or 5.  We also recommend a diet low in fat and sugar.  Inactivity, poor dietary choices and obesity can cause diabetes, heart attack, stroke, and kidney damage, among others.    ALCOHOL AND SMOKING:  Women should limit their alcohol intake to no more than 7 drinks/beers/glasses of wine (combined, not each!) per week. Moderation of alcohol intake to this level decreases your risk of breast cancer and liver damage. And of course, no recreational drugs are part of a healthy lifestyle.  And absolutely no smoking or even second hand smoke. Most people know smoking can cause heart and lung diseases, but did you know it also contributes to weakening of your bones? Aging of your skin?  Yellowing of your teeth and nails?  CALCIUM AND VITAMIN D:  Adequate intake of calcium and Vitamin D are recommended.  The recommendations for exact amounts of these supplements seem to change often, but generally speaking 600 mg of calcium (either carbonate or citrate) and 800 units of Vitamin D per day seems prudent. Certain women may benefit from higher intake of Vitamin D.  If you are among these women, your doctor will have told you during your visit.    PAP SMEARS:  Pap smears, to check for cervical cancer or precancers,  have traditionally been done yearly, although recent scientific advances have shown that most women can have pap smears less often.  However, every woman still should have a physical exam from her gynecologist every year. It will include a breast check, inspection of the vulva and vagina to check for abnormal growths or skin changes, a visual exam of the cervix, and then an exam to evaluate the size and shape of the uterus and  ovaries.  And after 72 years of age, a rectal exam is indicated to check for rectal cancers. We will also provide age appropriate advice regarding health maintenance, like when you should have certain vaccines, screening for sexually transmitted diseases, bone density testing, colonoscopy, mammograms, etc.   MAMMOGRAMS:  All women over 40 years old should have a yearly mammogram. Many facilities now offer a "3D" mammogram, which may cost around $50 extra out of pocket. If possible,  we recommend you accept the option to have the 3D mammogram performed.  It both reduces the number of women who will be called back for extra views which then turn out to be normal, and it is better than the routine mammogram at detecting truly abnormal areas.    COLONOSCOPY:  Colonoscopy to screen for colon cancer is recommended for all women at age 50.  We know, you hate the idea of the prep.  We agree, BUT, having colon cancer and not knowing it is worse!!  Colon cancer so often starts as a polyp that can be seen and removed at colonscopy, which can quite literally save your life!  And if your first colonoscopy is normal and you have no family history of colon cancer, most women don't have to have it again for 10 years.  Once every ten years, you can do something that may end up saving your life, right?  We will be happy to help you get it scheduled when you are ready.    Be sure to check your insurance coverage so you understand how much it will cost.  It may be covered as a preventative service at no cost, but you should check your particular policy.    Calcium Content in Foods Calcium is the most abundant mineral in the body. Most of the body's calcium supply is stored in bones and teeth. Calcium helps many parts of the body function normally, including: Blood and blood vessels. Nerves. Hormones. Muscles. Bones and teeth. When your calcium stores are low, you may be at risk for low bone mass, bone loss, and broken bones  (fractures). When you get enough calcium, it helps to support strong bones and teeth throughout your life. Calcium is especially important for: Children during growth spurts. Girls during adolescence. Women who are pregnant or breastfeeding. Women after their menstrual cycle stops (postmenopause). Women whose menstrual cycle has stopped due to anorexia nervosa or regular intense exercise. People who cannot eat or digest dairy products. Vegans. Recommended daily amounts of calcium: Women (ages 19 to 50): 1,000 mg per day. Women (ages 51 and older): 1,200 mg per day. Men (ages 19 to 70): 1,000 mg per day. Men (ages 71 and older): 1,200 mg per day. Women (ages 9 to 18): 1,300 mg per day. Men (ages 9 to 18): 1,300 mg per day. General information Eat foods that are high in calcium. Try to get most of your calcium from food. Some people may benefit from taking calcium supplements. Check with your health care provider or diet and nutrition specialist (dietitian) before starting any calcium supplements. Calcium supplements may interact with certain medicines. Too much calcium may cause other health problems, such as constipation and kidney stones. For the body to absorb calcium, it needs vitamin D. Sources of vitamin D include: Skin exposure to direct sunlight. Foods, such as egg yolks, liver, mushrooms, saltwater fish, and fortified milk. Vitamin D supplements. Check with your health care provider or dietitian before starting any vitamin D supplements. What foods are high in calcium?  Foods that are high in calcium contain more than 100 milligrams per serving. Fruits Fortified orange juice or other fruit juice, 300 mg per 8 oz serving. Vegetables Collard greens, 360 mg per 8 oz serving. Kale, 100 mg per 8 oz serving. Bok choy, 160 mg per 8 oz serving. Grains Fortified ready-to-eat cereals, 100 to 1,000 mg per 8 oz serving. Fortified frozen waffles, 200 mg in 2 waffles. Oatmeal, 140 mg in  1 cup. Meats and other proteins Sardines, canned with bones, 325 mg per 3 oz serving. Salmon, canned with bones, 180 mg per 3 oz serving. Canned shrimp, 125 mg per 3 oz serving. Baked beans, 160 mg per 4 oz serving. Tofu, firm, made with calcium sulfate, 253 mg per 4 oz serving. Dairy Yogurt, plain, low-fat, 310 mg per 6 oz serving. Nonfat milk, 300 mg per 8 oz serving. American cheese, 195 mg per 1 oz serving. Cheddar cheese, 205 mg per 1 oz serving. Cottage cheese 2%, 105 mg per 4 oz serving. Fortified soy, rice, or almond milk, 300 mg per 8 oz serving. Mozzarella, part skim, 210 mg per 1 oz serving. The items listed above may not be a complete list of foods high in calcium. Actual amounts of calcium may be different depending on processing. Contact a dietitian for more information. What foods are lower in calcium? Foods that are lower in calcium contain 50 mg or less per serving. Fruits Apple, about 6 mg. Banana, about 12 mg.   Vegetables Lettuce, 19 mg per 2 oz serving. Tomato, about 11 mg. Grains Rice, 4 mg per 6 oz serving. Boiled potatoes, 14 mg per 8 oz serving. White bread, 6 mg per slice. Meats and other proteins Egg, 27 mg per 2 oz serving. Red meat, 7 mg per 4 oz serving. Chicken, 17 mg per 4 oz serving. Fish, cod, or trout, 20 mg per 4 oz serving. Dairy Cream cheese, regular, 14 mg per 1 Tbsp serving. Brie cheese, 50 mg per 1 oz serving. Parmesan cheese, 70 mg per 1 Tbsp serving. The items listed above may not be a complete list of foods lower in calcium. Actual amounts of calcium may be different depending on processing. Contact a dietitian for more information. Summary Calcium is an important mineral in the body because it affects many functions. Getting enough calcium helps support strong bones and teeth throughout your life. Try to get most of your calcium from food. Calcium supplements may interact with certain medicines. Check with your health care provider  or dietitian before starting any calcium supplements. This information is not intended to replace advice given to you by your health care provider. Make sure you discuss any questions you have with your health care provider. Document Revised: 04/12/2020 Document Reviewed: 04/12/2020 Elsevier Patient Education  2023 [employer]  

## 2022-10-02 ENCOUNTER — Ambulatory Visit (INDEPENDENT_AMBULATORY_CARE_PROVIDER_SITE_OTHER): Payer: Medicare Other | Admitting: Physician Assistant

## 2022-10-02 ENCOUNTER — Encounter: Payer: Self-pay | Admitting: Physician Assistant

## 2022-10-02 ENCOUNTER — Other Ambulatory Visit (INDEPENDENT_AMBULATORY_CARE_PROVIDER_SITE_OTHER): Payer: Medicare Other

## 2022-10-02 VITALS — BP 113/62 | HR 65 | Ht 63.75 in | Wt 199.5 lb

## 2022-10-02 DIAGNOSIS — R140 Abdominal distension (gaseous): Secondary | ICD-10-CM

## 2022-10-02 DIAGNOSIS — R141 Gas pain: Secondary | ICD-10-CM

## 2022-10-02 DIAGNOSIS — R079 Chest pain, unspecified: Secondary | ICD-10-CM

## 2022-10-02 DIAGNOSIS — K5900 Constipation, unspecified: Secondary | ICD-10-CM

## 2022-10-02 DIAGNOSIS — K8020 Calculus of gallbladder without cholecystitis without obstruction: Secondary | ICD-10-CM

## 2022-10-02 LAB — COMPREHENSIVE METABOLIC PANEL
ALT: 10 U/L (ref 0–35)
AST: 15 U/L (ref 0–37)
Albumin: 4.2 g/dL (ref 3.5–5.2)
Alkaline Phosphatase: 64 U/L (ref 39–117)
BUN: 16 mg/dL (ref 6–23)
CO2: 31 mEq/L (ref 19–32)
Calcium: 9.8 mg/dL (ref 8.4–10.5)
Chloride: 103 mEq/L (ref 96–112)
Creatinine, Ser: 0.74 mg/dL (ref 0.40–1.20)
GFR: 80.65 mL/min (ref >60.00)
Glucose, Bld: 102 mg/dL — ABNORMAL HIGH (ref 70–99)
Potassium: 4.1 mEq/L (ref 3.5–5.1)
Sodium: 141 mEq/L (ref 135–145)
Total Bilirubin: 0.4 mg/dL (ref 0.2–1.2)
Total Protein: 7.3 g/dL (ref 6.0–8.3)

## 2022-10-02 LAB — CBC WITH DIFFERENTIAL/PLATELET
Basophils Absolute: 0.1 10*3/uL (ref 0.0–0.1)
Basophils Relative: 1.3 % (ref 0.0–3.0)
Eosinophils Absolute: 0.3 10*3/uL (ref 0.0–0.7)
Eosinophils Relative: 3.4 % (ref 0.0–5.0)
HCT: 35 % — ABNORMAL LOW (ref 36.0–46.0)
Hemoglobin: 11.8 g/dL — ABNORMAL LOW (ref 12.0–15.0)
Lymphocytes Relative: 22.5 % (ref 12.0–46.0)
Lymphs Abs: 1.7 10*3/uL (ref 0.7–4.0)
MCHC: 33.7 g/dL (ref 30.0–36.0)
MCV: 91.9 fl (ref 78.0–100.0)
Monocytes Absolute: 0.4 10*3/uL (ref 0.1–1.0)
Monocytes Relative: 5.6 % (ref 3.0–12.0)
Neutro Abs: 5.2 10*3/uL (ref 1.4–7.7)
Neutrophils Relative %: 67.2 % (ref 43.0–77.0)
Platelets: 228 10*3/uL (ref 150.0–400.0)
RBC: 3.81 Mil/uL — ABNORMAL LOW (ref 3.87–5.11)
RDW: 13.8 % (ref 11.5–15.5)
WBC: 7.7 10*3/uL (ref 4.0–10.5)

## 2022-10-02 NOTE — Patient Instructions (Addendum)
_______________________________________________________  If you are age 72 or older, your body mass index should be between 23-30. Your Body mass index is 34.51 kg/m. If this is out of the aforementioned range listed, please consider follow up with your Primary Care Provider.  If you are age 50 or younger, your body mass index should be between 19-25. Your Body mass index is 34.51 kg/m. If this is out of the aformentioned range listed, please consider follow up with your Primary Care Provider.   ________________________________________________________  The [facility] providers would like to encourage you to use [facility] to communicate with providers for non-urgent requests or questions.  Due to long hold times on the telephone, sending your provider a message by [facility] may be a faster and more efficient way to get a response.  Please allow 48 business hours for a response.  Please remember that this is for non-urgent requests.  _______________________________________________________  Your provider has requested that you go to the basement level for lab work before leaving today. Press "B" on the elevator. The lab is located at the first door on the left as you exit the elevator.  You have been given a testing kit to check for small intestine bacterial overgrowth (SIBO) which is completed by a company named [facility]. Make sure to return your test in the mail using the return mailing label given to you along with the kit. Your demographic and insurance information have already been sent to the company and they should be in contact with you over the next 1-2 weeks regarding this test. [facility] will collect an upfront charge of $99.74 for commercial insurance plans and $209.74 is you are paying cash. Make sure to discuss with [facility] PRIOR to having the test to see if they have gotten information from your insurance company as to how much your testing will cost out of pocket, if any.  Please keep in mind that you will be getting a call from phone number [phone] or a similar number. If you do not hear from them within this time frame, please call our office at [phone] or call [facility] directly at [phone].   Take Miralax - 1 dose daily  Take 2 Gas X with meals

## 2022-10-02 NOTE — Progress Notes (Signed)
 Subjective:    Patient ID: Meredith [name], female    DOB: 08/06/1951, 71 y.o.   MRN: [id number]  HPI Jerome is a pleasant 71-year-old white female, established with Dr. Nandigam, who was last seen here in January 2022 when she underwent EGD and colonoscopy. She comes back in today with complaints of abdominal bloating and excessive gassiness which has been present over the past 4 to 6 weeks with a lot of flatulence.  She has had some constipation, not having bowel movements on a daily basis and frequently not passing much stool at a time.  She is not using any regular laxatives, does use stool softeners as needed. She denies any abdominal pain just feels bloated, and this is worse after eating.  She has not had any recent changes in her diet, no new medications or change in medication dosages. She also had an ER visit in August 2023 for an episode of right-sided chest pain which came on acutely and was nonradiating.  This was not associated with any weakness , diaphoresis or shortness of breath, no radiation to the back.  She says it lasted about 30 minutes and then resolved.  She reports that her blood pressure was significantly elevated initially with systolic over 200.  Work-up in the ER with negative initial cardiac eval.  She did have upper abdominal ultrasound done that showed 1 gallstone, no gallbladder wall thickening, no ductal dilation/CBD 5 mm She would was advised to have cardiac follow-up which she says she did do, and was told she did not need any further cardiac evaluation. She had 1 other episode of the same right-sided chest pain a few weeks later which was not as intense and again lasted about 30 minutes, no associated nausea diaphoresis weakness or shortness of breath.  She has not had any recurrence since.  Her medical problems include hypertension, diabetes, sleep apnea, COPD, B12 and iron deficiency for which she is followed by hematology and says that she just had an iron infusion  within the past couple of weeks. Colonoscopy January 2022 with multiple large mouth diverticuli in the sigmoid and descending colon and one 3 mm polyp was removed from the rectum which was hyperplastic, nonbleeding internal hemorrhoids. EGD at that same time with 1 nonbleeding cratered gastric ulcer at the pylorus less than 1 cm, patchy inflammatory changes in the prepylorus as well.  Biopsies showed slight chronic inflammation of the stomach no H. pylori.  She was treated with a 3-month course of twice daily PPI.  Regarding the gas and bloating, she is on Glucophage but says she has been on stable dosages for a long time.  Review of Systems. Pertinent positive and negative review of systems were noted in the above HPI section.  All other review of systems was otherwise negative.   Outpatient Encounter Medications as of 10/02/2022  Medication Sig   ALPRAZolam (XANAX) 0.5 MG tablet Take 1 tablet (0.5 mg total) by mouth 2 (two) times daily as needed for anxiety.   amLODipine (NORVASC) 2.5 MG tablet TAKE 1 TABLET BY MOUTH EVERY DAY   aspirin 81 MG tablet Take 81 mg by mouth daily.   escitalopram (LEXAPRO) 20 MG tablet TAKE 1 TABLET BY MOUTH EVERY DAY   furosemide (LASIX) 20 MG tablet TAKE 1 TABLET BY MOUTH TWICE A DAY   irbesartan (AVAPRO) 300 MG tablet TAKE 1 TABLET BY MOUTH EVERY DAY   metFORMIN (GLUCOPHAGE) 1000 MG tablet TAKE 1 TABLET BY MOUTH TWICE DAILY WITH A   MEAL   rosuvastatin (CRESTOR) 10 MG tablet TAKE 1 TABLET BY MOUTH EVERY DAY   TRADJENTA 5 MG TABS tablet TAKE 1 TABLET BY MOUTH EVERY DAY   No facility-administered encounter medications on file as of 10/02/2022.   No Known Allergies Patient Active Problem List   Diagnosis Date Noted   DOE (dyspnea on exertion) 11/11/2021   Iron deficiency anemia 11/21/2020   Aortic atherosclerosis (HCC) 06/05/2020   Chest pain of uncertain etiology 01/10/2020   Educated about COVID-19 virus infection 01/10/2020   Bilateral primary osteoarthritis  of knee 03/19/2019   B12 deficiency anemia    Other emphysema (HCC) 02/23/2014   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HCC) 12/31/2013   Essential hypertension, benign 11/05/2013   Routine health maintenance 09/27/2012   Vitamin D deficiency 06/20/2011   OBESITY, CLASS II 03/18/2011   Lung nodules 07/12/2010   Diabetes mellitus type 2, controlled, without complications (HCC) 11/02/2007   Hyperlipidemia 10/01/2007   ANXIETY 10/01/2007   Depression 10/01/2007   OSA (obstructive sleep apnea) 10/01/2007   Social History   Socioeconomic History   Marital status: Divorced    Spouse name: Not on file   Number of children: 3   Years of education: 14   Highest education level: Not on file  Occupational History   Occupation: Counselor/therapist    Employer: [employer]  Tobacco Use   Smoking status: Former    Packs/day: 1.00    Years: 30.00    Total pack years: 30.00    Types: Cigarettes   Smokeless tobacco: Never  Vaping Use   Vaping Use: Never used  Substance and Sexual Activity   Alcohol use: Yes    Alcohol/week: 6.0 standard drinks of alcohol    Types: 6 Glasses of wine per week    Comment: 6 glasses or wine each week   Drug use: No   Sexual activity: Not Currently    Birth control/protection: Post-menopausal    Comment: 1st intercourse 71 yo-Fewer than 5 partners  Other Topics Concern   Not on file  Social History Narrative   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 BA- psych from [employer]. Married - 14 years -divorced. 3 daughters - twins 1985 - one dtr,Charlotte, died 2013 unknown causes, 1980   Work: private practice substance abuse counselor. No h/o physical or sexual abuse   Social Determinants of Health   Financial Resource Strain: Low Risk  (06/28/2022)   Overall Financial Resource Strain (CARDIA)    Difficulty of Paying Living Expenses: Not hard at all  Food Insecurity: No Food Insecurity (06/28/2022)   Hunger Vital Sign     Worried About Running Out of Food in the Last Year: Never true    Ran Out of Food in the Last Year: Never true  Transportation Needs: No Transportation Needs (06/28/2022)   PRAPARE - Transportation    Lack of Transportation (Medical): No    Lack of Transportation (Non-Medical): No  Physical Activity: Insufficiently Active (06/28/2022)   Exercise Vital Sign    Days of Exercise per Week: 7 days    Minutes of Exercise per Session: 20 min  Stress: No Stress Concern Present (06/28/2022)   [employer] - Occupational Stress Questionnaire    Feeling of Stress : Not at all  Social Connections: Socially Isolated (06/28/2022)   Social Connection and Isolation Panel [NHANES]    Frequency of Communication with Friends and Family: More than three times a week      Frequency of Social Gatherings with Friends and Family: Twice a week    Attends Religious Services: Never    Marine scientist or Organizations: No    Attends Archivist Meetings: Never    Marital Status: Divorced  Human resources officer Violence: Not At Risk (06/28/2022)   Humiliation, Afraid, Rape, and Kick questionnaire    Fear of Current or Ex-Partner: No    Emotionally Abused: No    Physically Abused: No    Sexually Abused: No    Ms. Beachem's family history includes Cancer in her father; Diabetes in her father; Heart attack (age of onset: 42) in her father; Heart disease in her father; Multiple sclerosis in her mother.      Objective:    Vitals:   10/02/22 1507  BP: 113/62  Pulse: 65    Physical Exam Well-developed well-nourished older WF in no acute distress.Pleasant   Weight,199  BMI 34.5  HEENT; nontraumatic normocephalic, EOMI, PE R LA, sclera anicteric. Oropharynx;not examined  Neck; supple, no JVD Cardiovascular; regular rate and rhythm with S1-S2, no murmur rub or gallop Pulmonary; Clear bilaterally Abdomen; soft, there is some mild tenderness in the epig/RUQ, no guarding non  distended, no palpable mass or hepatosplenomegaly, bowel sounds are active Rectal; not done Skin; benign exam, no jaundice rash or appreciable lesions Extremities; no clubbing cyanosis or edema skin warm and dry Neuro/Psych; alert and oriented x4, grossly nonfocal mood and affect appropriate        Assessment & Plan:   #40 71 year old white female with 2 recent episodes of right-sided chest pain, each lasting about 30 minutes.  The last episode was about a month ago which she has not had recurrence since.  ER visit in August with negative EKG troponins etc. and she did follow-up with cardiology and no further work-up was suggested. Upper abdominal ultrasound during that ER visit did show one gallstone, no gallbladder wall thickening and no ductal dilation.  It is unclear whether these episodes of right-sided chest pain represent biliary colic or not.  #2  4 to 5-week history of persistent bloating and abdominal gas, increased flatulence and mild constipation Rule out SIBO Rule out medication induced She does use artificial sweeteners but has been doing so for years with no recent changes  #3 diabetes mellitus #4.  Hypertension #5.  Sleep apnea #6.  COPD #7.  History of gastric ulcer January 2022 H. pylori negative #8 diverticulosis #9 colon cancer screening-up-to-date with colonoscopy last done January 2022 1 hyperplastic polyp  Plan; start low gas diet Start MiraLAX 17 g in 8 ounces of water daily, hopefully with better bowel movements she will have a decrease in gas. Breath testing for SIBO CBC and c-Met today Start Gas-X 1-2 with each meal We discussed surgical consultation for laparoscopic cholecystectomy, she is not anxious to pursue that as yet.  I have asked her to call if she has any recurrent episodes of the right-sided chest pain.  If she continues to have recurrent episodes she will need to be referred to surgery at least for consultation.  Follow-up with Dr. Silverio Decamp  or myself in about 6 weeks   Benancio Osmundson S Weston PA-C 10/02/2022   Cc: Marrian Salvage,*

## 2022-10-03 LAB — CYTOLOGY - PAP
Comment: NEGATIVE
Diagnosis: NEGATIVE
High risk HPV: NEGATIVE

## 2022-10-10 ENCOUNTER — Other Ambulatory Visit: Payer: Self-pay | Admitting: Family

## 2022-11-05 ENCOUNTER — Ambulatory Visit (INDEPENDENT_AMBULATORY_CARE_PROVIDER_SITE_OTHER): Payer: Medicare Other | Admitting: Family

## 2022-11-05 ENCOUNTER — Encounter: Payer: Self-pay | Admitting: Family

## 2022-11-05 VITALS — BP 138/68 | HR 74 | Temp 98.0°F | Ht 64.0 in | Wt 199.0 lb

## 2022-11-05 DIAGNOSIS — E782 Mixed hyperlipidemia: Secondary | ICD-10-CM | POA: Diagnosis not present

## 2022-11-05 DIAGNOSIS — F32A Depression, unspecified: Secondary | ICD-10-CM | POA: Diagnosis not present

## 2022-11-05 DIAGNOSIS — Z23 Encounter for immunization: Secondary | ICD-10-CM

## 2022-11-05 DIAGNOSIS — F419 Anxiety disorder, unspecified: Secondary | ICD-10-CM | POA: Diagnosis not present

## 2022-11-05 DIAGNOSIS — E119 Type 2 diabetes mellitus without complications: Secondary | ICD-10-CM | POA: Diagnosis not present

## 2022-11-05 DIAGNOSIS — I10 Essential (primary) hypertension: Secondary | ICD-10-CM | POA: Diagnosis not present

## 2022-11-05 MED ORDER — LINAGLIPTIN 5 MG PO TABS: 5.0000 mg | ORAL_TABLET | Freq: Every day | ORAL | 3 refills | Status: DC

## 2022-11-05 MED ORDER — FUROSEMIDE 20 MG PO TABS: 20.0000 mg | ORAL_TABLET | Freq: Two times a day (BID) | ORAL | 3 refills | Status: DC

## 2022-11-05 MED ORDER — ALPRAZOLAM 0.5 MG PO TABS: 0.5000 mg | ORAL_TABLET | Freq: Two times a day (BID) | ORAL | 0 refills | Status: DC | PRN

## 2022-11-05 MED ORDER — AMLODIPINE BESYLATE 2.5 MG PO TABS: ORAL_TABLET | ORAL | 3 refills | Status: DC

## 2022-11-05 MED ORDER — METFORMIN HCL 1000 MG PO TABS: ORAL_TABLET | ORAL | 3 refills | Status: DC

## 2022-11-05 MED ORDER — IRBESARTAN 300 MG PO TABS: 300.0000 mg | ORAL_TABLET | Freq: Every day | ORAL | 3 refills | Status: DC

## 2022-11-05 MED ORDER — ROSUVASTATIN CALCIUM 10 MG PO TABS: 10.0000 mg | ORAL_TABLET | Freq: Every day | ORAL | 3 refills | Status: DC

## 2022-11-05 MED ORDER — ESCITALOPRAM OXALATE 20 MG PO TABS: 20.0000 mg | ORAL_TABLET | Freq: Every day | ORAL | 3 refills | Status: DC

## 2022-11-05 NOTE — Progress Notes (Signed)
Meredith [name] is a 71 y.o. female with the following history as recorded in EpicCare:  Patient Active Problem List   Diagnosis Date Noted   DOE (dyspnea on exertion) 11/11/2021   Iron deficiency anemia 11/21/2020   Aortic atherosclerosis ([place]) 06/05/2020   Chest pain of uncertain etiology 16/09/9603   Educated about COVID-19 virus infection 01/10/2020   Bilateral primary osteoarthritis of knee 03/19/2019   B12 deficiency anemia    Other emphysema ([place]) 02/23/2014   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[place]) 12/31/2013   Essential hypertension, benign 11/05/2013   Routine health maintenance 09/27/2012   Vitamin D deficiency 06/20/2011   OBESITY, CLASS II 03/18/2011   Lung nodules 07/12/2010   Diabetes mellitus type 2, controlled, without complications ([place]) 54/08/8118   Hyperlipidemia 10/01/2007   Anxiety 10/01/2007   Depression 10/01/2007   OSA (obstructive sleep apnea) 10/01/2007    Current Outpatient Medications  Medication Sig Dispense Refill   aspirin 81 MG tablet Take 81 mg by mouth daily.     ALPRAZolam (XANAX) 0.5 MG tablet Take 1 tablet (0.5 mg total) by mouth 2 (two) times daily as needed for anxiety. 60 tablet 0   amLODipine (NORVASC) 2.5 MG tablet TAKE 1 TABLET BY MOUTH EVERY DAY 90 tablet 3   escitalopram (LEXAPRO) 20 MG tablet Take 1 tablet (20 mg total) by mouth daily. 90 tablet 3   furosemide (LASIX) 20 MG tablet Take 1 tablet (20 mg total) by mouth 2 (two) times daily. 180 tablet 3   irbesartan (AVAPRO) 300 MG tablet Take 1 tablet (300 mg total) by mouth daily. 90 tablet 3   linagliptin (TRADJENTA) 5 MG TABS tablet Take 1 tablet (5 mg total) by mouth daily. 90 tablet 3   metFORMIN (GLUCOPHAGE) 1000 MG tablet TAKE 1 TABLET BY MOUTH TWICE A DAY WITH FOOD 180 tablet 3   rosuvastatin (CRESTOR) 10 MG tablet Take 1 tablet (10 mg total) by mouth daily. 90 tablet 3   No current facility-administered medications for this visit.    Allergies: Patient has no known allergies.   Past Medical History:  Diagnosis Date   Anemia    Anxiety state, unspecified    Arthritis    KNEES   B12 deficiency anemia 05/2014 dx   CIN I (cervical intraepithelial neoplasia I)    Depressive disorder, not elsewhere classified    Diabetes mellitus without complication (HCC)    Emphysema of lung (HCC)    Gallstones    Hypertension    Internal hemorrhoids    Other and unspecified hyperlipidemia    Stomach ulcer    Unspecified sleep apnea     Past Surgical History:  Procedure Laterality Date   cataract surg Right    CERVICAL CERCLAGE     CESAREAN SECTION     COLONOSCOPY     HEMORRHOID SURGERY     KNEE ARTHROSCOPY Left 12/2014   UPPER GASTROINTESTINAL ENDOSCOPY      Family History  Problem Relation Age of Onset   Multiple sclerosis Mother    Heart attack Father 83   Diabetes Father    Cancer Father        lung   Heart disease Father     Social History   Tobacco Use   Smoking status: Former    Packs/day: 1.00    Years: 30.00    Total pack years: 30.00    Types: Cigarettes   Smokeless tobacco: Never  Substance Use Topics   Alcohol use: Yes    Alcohol/week: 6.0  standard drinks of alcohol    Types: 6 Glasses of wine per week    Comment: 6 glasses or wine each week    Subjective:   Presents for yearly CPE; sees specialist for majority of healthcare needs including GI, hematology, GYN; needs updated refills today;  Needs medication refills updated; would like to get flu shot;     Objective:  Vitals:   11/05/22 1506  BP: 138/68  Pulse: 74  Temp: 98 F (36.7 C)  TempSrc: Oral  SpO2: 98%  Weight: 199 lb (90.3 kg)  Height: '5\' 4"'$  (1.626 m)    General: Well developed, well nourished, in no acute distress  Skin : Warm and dry.  Head: Normocephalic and atraumatic  Eyes: Sclera and conjunctiva clear; pupils round and reactive to light; extraocular movements intact  Ears: External normal; canals clear; tympanic membranes normal  Oropharynx: Pink, supple. No  suspicious lesions  Neck: Supple without thyromegaly, adenopathy  Lungs: Respirations unlabored; clear to auscultation bilaterally without wheeze, rales, rhonchi  CVS exam: normal rate and regular rhythm.  Abdomen: Soft; nontender; nondistended; normoactive bowel sounds; no masses or hepatosplenomegaly  Musculoskeletal: No deformities; no active joint inflammation  Extremities: No edema, cyanosis, clubbing  Vessels: Symmetric bilaterally  Neurologic: Alert and oriented; speech intact; face symmetrical; moves all extremities well; CNII-XII intact without focal deficit   Assessment:  1. Controlled type 2 diabetes mellitus without complication, without long-term current use of insulin ([place])   2. Benign hypertension   3. Need for immunization against influenza   4. Anxiety and depression   5. Mixed hyperlipidemia     Plan:  Age appropriate preventive healthcare needs addressed; encouraged regular eye doctor and dental exams; encouraged regular exercise; will update labs and refills as needed today; follow-up in 1 year, sooner prn.  Continue with GI and hematology as regularly scheduled;    No follow-ups on file.  Orders Placed This Encounter  Procedures   Flu Vaccine QUAD High Dose(Fluad)    Requested Prescriptions   Signed Prescriptions Disp Refills   ALPRAZolam (XANAX) 0.5 MG tablet 60 tablet 0    Sig: Take 1 tablet (0.5 mg total) by mouth 2 (two) times daily as needed for anxiety.   amLODipine (NORVASC) 2.5 MG tablet 90 tablet 3    Sig: TAKE 1 TABLET BY MOUTH EVERY DAY   escitalopram (LEXAPRO) 20 MG tablet 90 tablet 3    Sig: Take 1 tablet (20 mg total) by mouth daily.   furosemide (LASIX) 20 MG tablet 180 tablet 3    Sig: Take 1 tablet (20 mg total) by mouth 2 (two) times daily.   irbesartan (AVAPRO) 300 MG tablet 90 tablet 3    Sig: Take 1 tablet (300 mg total) by mouth daily.   metFORMIN (GLUCOPHAGE) 1000 MG tablet 180 tablet 3    Sig: TAKE 1 TABLET BY MOUTH TWICE A DAY  WITH FOOD   rosuvastatin (CRESTOR) 10 MG tablet 90 tablet 3    Sig: Take 1 tablet (10 mg total) by mouth daily.   linagliptin (TRADJENTA) 5 MG TABS tablet 90 tablet 3    Sig: Take 1 tablet (5 mg total) by mouth daily.

## 2022-11-11 ENCOUNTER — Encounter: Payer: Self-pay | Admitting: Family

## 2022-11-12 ENCOUNTER — Ambulatory Visit: Payer: Medicare Other | Admitting: Gastroenterology

## 2022-12-12 ENCOUNTER — Ambulatory Visit
Admission: RE | Admit: 2022-12-12 | Discharge: 2022-12-12 | Disposition: A | Discharge status: Home / Self Care | Payer: Medicare Other | Source: Ambulatory Visit | Attending: Family | Admitting: Family

## 2022-12-12 DIAGNOSIS — Z87891 Personal history of nicotine dependence: Secondary | ICD-10-CM | POA: Diagnosis not present

## 2022-12-12 DIAGNOSIS — J432 Centrilobular emphysema: Secondary | ICD-10-CM | POA: Diagnosis not present

## 2022-12-12 DIAGNOSIS — I700 Atherosclerosis of aorta: Secondary | ICD-10-CM | POA: Diagnosis not present

## 2022-12-12 DIAGNOSIS — I2510 Atherosclerotic heart disease of native coronary artery without angina pectoris: Secondary | ICD-10-CM | POA: Diagnosis not present

## 2022-12-16 ENCOUNTER — Other Ambulatory Visit: Payer: Self-pay

## 2022-12-16 DIAGNOSIS — Z122 Encounter for screening for malignant neoplasm of respiratory organs: Secondary | ICD-10-CM

## 2022-12-16 DIAGNOSIS — Z87891 Personal history of nicotine dependence: Secondary | ICD-10-CM

## 2022-12-19 ENCOUNTER — Ambulatory Visit: Payer: Medicare Other | Admitting: Physician Assistant

## 2023-01-16 DIAGNOSIS — Z23 Encounter for immunization: Secondary | ICD-10-CM | POA: Diagnosis not present

## 2023-03-04 ENCOUNTER — Encounter: Payer: Self-pay | Admitting: Hematology and Oncology

## 2023-03-11 ENCOUNTER — Inpatient Hospital Stay: Payer: Medicare Other | Attending: Nurse Practitioner

## 2023-03-11 DIAGNOSIS — E538 Deficiency of other specified B group vitamins: Secondary | ICD-10-CM | POA: Diagnosis not present

## 2023-03-11 DIAGNOSIS — E119 Type 2 diabetes mellitus without complications: Secondary | ICD-10-CM | POA: Diagnosis not present

## 2023-03-11 DIAGNOSIS — D509 Iron deficiency anemia, unspecified: Secondary | ICD-10-CM | POA: Diagnosis not present

## 2023-03-11 DIAGNOSIS — D519 Vitamin B12 deficiency anemia, unspecified: Secondary | ICD-10-CM

## 2023-03-11 LAB — CBC WITH DIFFERENTIAL/PLATELET
Abs Immature Granulocytes: 0.02 10*3/uL (ref 0.00–0.07)
Basophils Absolute: 0.1 10*3/uL (ref 0.0–0.1)
Basophils Relative: 1 %
Eosinophils Absolute: 0.2 10*3/uL (ref 0.0–0.5)
Eosinophils Relative: 4 %
HCT: 35.2 % — ABNORMAL LOW (ref 36.0–46.0)
Hemoglobin: 11.9 g/dL — ABNORMAL LOW (ref 12.0–15.0)
Immature Granulocytes: 0 %
Lymphocytes Relative: 23 %
Lymphs Abs: 1.4 10*3/uL (ref 0.7–4.0)
MCH: 31 pg (ref 26.0–34.0)
MCHC: 33.8 g/dL (ref 30.0–36.0)
MCV: 91.7 fL (ref 80.0–100.0)
Monocytes Absolute: 0.4 10*3/uL (ref 0.1–1.0)
Monocytes Relative: 6 %
Neutro Abs: 4.1 10*3/uL (ref 1.7–7.7)
Neutrophils Relative %: 66 %
Platelets: 236 10*3/uL (ref 150–400)
RBC: 3.84 MIL/uL — ABNORMAL LOW (ref 3.87–5.11)
RDW: 12.8 % (ref 11.5–15.5)
WBC: 6.3 10*3/uL (ref 4.0–10.5)
nRBC: 0 % (ref 0.0–0.2)

## 2023-03-11 LAB — IRON AND IRON BINDING CAPACITY (CC-WL,HP ONLY)
Iron: 85 ug/dL (ref 28–170)
Saturation Ratios: 25 % (ref 10.4–31.8)
TIBC: 346 ug/dL (ref 250–450)
UIBC: 261 ug/dL (ref 148–442)

## 2023-03-11 LAB — RETICULOCYTES
Immature Retic Fract: 10.3 % (ref 2.3–15.9)
RBC.: 3.88 MIL/uL (ref 3.87–5.11)
Retic Count, Absolute: 55.5 10*3/uL (ref 19.0–186.0)
Retic Ct Pct: 1.4 % (ref 0.4–3.1)

## 2023-03-11 LAB — FERRITIN: Ferritin: 82 ng/mL (ref 11–307)

## 2023-03-11 LAB — VITAMIN B12: Vitamin B-12: 231 pg/mL (ref 180–914)

## 2023-03-13 ENCOUNTER — Inpatient Hospital Stay (HOSPITAL_BASED_OUTPATIENT_CLINIC_OR_DEPARTMENT_OTHER): Payer: Medicare Other | Admitting: Hematology and Oncology

## 2023-03-13 ENCOUNTER — Inpatient Hospital Stay: Payer: Medicare Other

## 2023-03-13 VITALS — BP 132/58 | HR 67 | Temp 97.4°F | Resp 18 | Ht 64.0 in | Wt 204.0 lb

## 2023-03-13 DIAGNOSIS — D519 Vitamin B12 deficiency anemia, unspecified: Secondary | ICD-10-CM | POA: Diagnosis not present

## 2023-03-13 DIAGNOSIS — D509 Iron deficiency anemia, unspecified: Secondary | ICD-10-CM

## 2023-03-13 DIAGNOSIS — E119 Type 2 diabetes mellitus without complications: Secondary | ICD-10-CM | POA: Diagnosis not present

## 2023-03-13 DIAGNOSIS — E538 Deficiency of other specified B group vitamins: Secondary | ICD-10-CM | POA: Diagnosis not present

## 2023-03-14 ENCOUNTER — Encounter: Payer: Self-pay | Admitting: Hematology and Oncology

## 2023-03-14 ENCOUNTER — Telehealth: Payer: Self-pay

## 2023-03-14 LAB — HEMOGLOBIN A1C
Hgb A1c MFr Bld: 6.3 % — ABNORMAL HIGH (ref 4.8–5.6)
Mean Plasma Glucose: 134 mg/dL

## 2023-03-14 NOTE — Progress Notes (Signed)
[facility] OFFICE PROGRESS NOTE  Meredith Salvage, FNP  ASSESSMENT & PLAN:  Iron deficiency anemia We reviewed test results. Her iron studies are adequate even though she is mildly anemic.  Her hemoglobin is near normal.  She does not need iron infusion  B12 deficiency anemia Her vitamin B12 level is borderline low at the lower limit of normal I recommend oral vitamin B12 supplement  Diabetes mellitus type 2, controlled, without complications ([place]) Per patient request, we will repeat hemoglobin A1c  Orders Placed This Encounter  Procedures   Hemoglobin A1c    Standing Status:   Standing    Number of Occurrences:   2    Standing Expiration Date:   03/12/2024   Iron and Iron Binding Capacity ([facility] only)    Standing Status:   Future    Standing Expiration Date:   03/12/2024   Vitamin B12    Standing Status:   Future    Standing Expiration Date:   03/12/2024   CBC with Differential (Cancer Center Only)    Standing Status:   Future    Standing Expiration Date:   03/12/2024   Ferritin    Standing Status:   Future    Standing Expiration Date:   03/12/2024    The total time spent in the appointment was 20 minutes encounter with patients including review of chart and various tests results, discussions about plan of care and coordination of care plan   All questions were answered. The patient knows to call the clinic with any problems, questions or concerns. No barriers to learning was detected.    Heath Lark, MD 3/15/202411:06 AM  INTERVAL HISTORY: Meredith [name] 72 y.o. female returns for review of test results Her energy level is fair The patient denies any recent signs or symptoms of bleeding such as spontaneous epistaxis, hematuria or hematochezia.  SUMMARY OF HEMATOLOGIC HISTORY:  She was found to have abnormal CBC from recent blood work I have the opportunity to review her CBC dated back to June 2015 Hemoglobin ranged from 10.3-11.9 Her most  recent blood work show evidence of severe iron deficiency She had EGD and colonoscopy performed in 2015 which showed no evidence of GI bleed She denies recent chest pain on exertion, but does complain of shortness of breath on minimal exertion and excessive fatigue. She had not noticed any recent bleeding such as epistaxis but did states she has occasional hemorrhoidal bleeding once a month She had taken NSAID in the past for chronic joint pain but nothing recently.  She is on chronic aspirin therapy  She had history of abnormal PAP smear but no prior history of cancer. Her age appropriate screening programs are up-to-date. She eats a variety of diet She used to donate blood a lot in the past but nothing in the last 20 years She has pica and craves ice The patient was prescribed oral iron supplements and she takes in the past but that cause significant constipation Between December 2021 to 2023 she received multiple doses of IV iron; she received her final dose of IV iron in September 2023  I have reviewed the past medical history, past surgical history, social history and family history with the patient and they are unchanged from previous note.  ALLERGIES:  has No Known Allergies.  MEDICATIONS:  Current Outpatient Medications  Medication Sig Dispense Refill   ALPRAZolam (XANAX) 0.5 MG tablet Take 1 tablet (0.5 mg total) by mouth 2 (two) times daily as  needed for anxiety. 60 tablet 0   amLODipine (NORVASC) 2.5 MG tablet TAKE 1 TABLET BY MOUTH EVERY DAY 90 tablet 3   aspirin 81 MG tablet Take 81 mg by mouth daily.     escitalopram (LEXAPRO) 20 MG tablet Take 1 tablet (20 mg total) by mouth daily. 90 tablet 3   furosemide (LASIX) 20 MG tablet Take 1 tablet (20 mg total) by mouth 2 (two) times daily. 180 tablet 3   irbesartan (AVAPRO) 300 MG tablet Take 1 tablet (300 mg total) by mouth daily. 90 tablet 3   linagliptin (TRADJENTA) 5 MG TABS tablet Take 1 tablet (5 mg total) by mouth daily. 90  tablet 3   metFORMIN (GLUCOPHAGE) 1000 MG tablet TAKE 1 TABLET BY MOUTH TWICE A DAY WITH FOOD 180 tablet 3   rosuvastatin (CRESTOR) 10 MG tablet Take 1 tablet (10 mg total) by mouth daily. 90 tablet 3   No current facility-administered medications for this visit.     REVIEW OF SYSTEMS:   Constitutional: Denies fevers, chills or night sweats Eyes: Denies blurriness of vision Ears, nose, mouth, throat, and face: Denies mucositis or sore throat Respiratory: Denies cough, dyspnea or wheezes Cardiovascular: Denies palpitation, chest discomfort or lower extremity swelling Gastrointestinal:  Denies nausea, heartburn or change in bowel habits Skin: Denies abnormal skin rashes Lymphatics: Denies new lymphadenopathy or easy bruising Neurological:Denies numbness, tingling or new weaknesses Behavioral/Psych: Mood is stable, no new changes  All other systems were reviewed with the patient and are negative.  PHYSICAL EXAMINATION: ECOG PERFORMANCE STATUS: 0 - Asymptomatic  Vitals:   03/13/23 1221  BP: (!) 132/58  Pulse: 67  Resp: 18  Temp: (!) 97.4 F (36.3 C)  SpO2: 97%   Filed Weights   03/13/23 1221  Weight: 204 lb (92.5 kg)    GENERAL:alert, no distress and comfortable  NEURO: alert & oriented x 3 with fluent speech, no focal motor/sensory deficits  LABORATORY DATA:  I have reviewed the data as listed     Component Value Date/Time   NA 141 10/02/2022 1600   K 4.1 10/02/2022 1600   CL 103 10/02/2022 1600   CO2 31 10/02/2022 1600   GLUCOSE 102 (H) 10/02/2022 1600   BUN 16 10/02/2022 1600   CREATININE 0.74 10/02/2022 1600   CREATININE 0.78 11/02/2021 1602   CALCIUM 9.8 10/02/2022 1600   PROT 7.3 10/02/2022 1600   ALBUMIN 4.2 10/02/2022 1600   AST 15 10/02/2022 1600   ALT 10 10/02/2022 1600   ALKPHOS 64 10/02/2022 1600   BILITOT 0.4 10/02/2022 1600   GFRNONAA >60 06/02/2022 2107    No results found for: "SPEP", "UPEP"  Lab Results  Component Value Date   WBC 6.3  03/11/2023   NEUTROABS 4.1 03/11/2023   HGB 11.9 (L) 03/11/2023   HCT 35.2 (L) 03/11/2023   MCV 91.7 03/11/2023   PLT 236 03/11/2023      Chemistry      Component Value Date/Time   NA 141 10/02/2022 1600   K 4.1 10/02/2022 1600   CL 103 10/02/2022 1600   CO2 31 10/02/2022 1600   BUN 16 10/02/2022 1600   CREATININE 0.74 10/02/2022 1600   CREATININE 0.78 11/02/2021 1602      Component Value Date/Time   CALCIUM 9.8 10/02/2022 1600   ALKPHOS 64 10/02/2022 1600   AST 15 10/02/2022 1600   ALT 10 10/02/2022 1600   BILITOT 0.4 10/02/2022 1600

## 2023-03-14 NOTE — Assessment & Plan Note (Signed)
We reviewed test results. Her iron studies are adequate even though she is mildly anemic.  Her hemoglobin is near normal.  She does not need iron infusion

## 2023-03-14 NOTE — Telephone Encounter (Signed)
Called and given below message. She verbalized understanding. 

## 2023-03-14 NOTE — Assessment & Plan Note (Signed)
Her vitamin B12 level is borderline low at the lower limit of normal I recommend oral vitamin B12 supplement

## 2023-03-14 NOTE — Telephone Encounter (Signed)
-----   Message from Heath Lark, MD sent at 03/14/2023  8:10 AM EDT ----- Regarding: hemoglobinA1c Pls call her with results

## 2023-03-14 NOTE — Assessment & Plan Note (Signed)
Per patient request, we will repeat hemoglobin A1c

## 2023-04-15 DIAGNOSIS — M1712 Unilateral primary osteoarthritis, left knee: Secondary | ICD-10-CM | POA: Diagnosis not present

## 2023-04-22 DIAGNOSIS — M1712 Unilateral primary osteoarthritis, left knee: Secondary | ICD-10-CM | POA: Diagnosis not present

## 2023-04-29 DIAGNOSIS — M1712 Unilateral primary osteoarthritis, left knee: Secondary | ICD-10-CM | POA: Diagnosis not present

## 2023-04-30 ENCOUNTER — Encounter: Payer: Self-pay | Admitting: Gastroenterology

## 2023-04-30 ENCOUNTER — Ambulatory Visit (INDEPENDENT_AMBULATORY_CARE_PROVIDER_SITE_OTHER): Payer: Medicare Other | Admitting: Gastroenterology

## 2023-04-30 ENCOUNTER — Other Ambulatory Visit (INDEPENDENT_AMBULATORY_CARE_PROVIDER_SITE_OTHER): Payer: Medicare Other

## 2023-04-30 VITALS — BP 124/62 | Ht 65.0 in | Wt 200.0 lb

## 2023-04-30 DIAGNOSIS — R1011 Right upper quadrant pain: Secondary | ICD-10-CM | POA: Diagnosis not present

## 2023-04-30 DIAGNOSIS — K8080 Other cholelithiasis without obstruction: Secondary | ICD-10-CM

## 2023-04-30 DIAGNOSIS — K5904 Chronic idiopathic constipation: Secondary | ICD-10-CM

## 2023-04-30 LAB — CBC WITH DIFFERENTIAL/PLATELET
Basophils Absolute: 0.1 10*3/uL (ref 0.0–0.1)
Basophils Relative: 1 % (ref 0.0–3.0)
Eosinophils Absolute: 0.3 10*3/uL (ref 0.0–0.7)
Eosinophils Relative: 3.4 % (ref 0.0–5.0)
HCT: 35.3 % — ABNORMAL LOW (ref 36.0–46.0)
Hemoglobin: 11.7 g/dL — ABNORMAL LOW (ref 12.0–15.0)
Lymphocytes Relative: 12.3 % (ref 12.0–46.0)
Lymphs Abs: 0.9 10*3/uL (ref 0.7–4.0)
MCHC: 33 g/dL (ref 30.0–36.0)
MCV: 91 fl (ref 78.0–100.0)
Monocytes Absolute: 0.5 10*3/uL (ref 0.1–1.0)
Monocytes Relative: 6.1 % (ref 3.0–12.0)
Neutro Abs: 5.8 10*3/uL (ref 1.4–7.7)
Neutrophils Relative %: 77.2 % — ABNORMAL HIGH (ref 43.0–77.0)
Platelets: 225 10*3/uL (ref 150.0–400.0)
RBC: 3.88 Mil/uL (ref 3.87–5.11)
RDW: 13.4 % (ref 11.5–15.5)
WBC: 7.6 10*3/uL (ref 4.0–10.5)

## 2023-04-30 LAB — COMPREHENSIVE METABOLIC PANEL
ALT: 12 U/L (ref 0–35)
AST: 17 U/L (ref 0–37)
Albumin: 4 g/dL (ref 3.5–5.2)
Alkaline Phosphatase: 63 U/L (ref 39–117)
BUN: 16 mg/dL (ref 6–23)
CO2: 28 mEq/L (ref 19–32)
Calcium: 9.3 mg/dL (ref 8.4–10.5)
Chloride: 103 mEq/L (ref 96–112)
Creatinine, Ser: 0.75 mg/dL (ref 0.40–1.20)
GFR: 79.05 mL/min (ref >60.00)
Glucose, Bld: 156 mg/dL — ABNORMAL HIGH (ref 70–99)
Potassium: 3.7 mEq/L (ref 3.5–5.1)
Sodium: 139 mEq/L (ref 135–145)
Total Bilirubin: 0.4 mg/dL (ref 0.2–1.2)
Total Protein: 7 g/dL (ref 6.0–8.3)

## 2023-04-30 NOTE — Patient Instructions (Signed)
You have been scheduled for an abdominal ultrasound at [facility]iology (1st floor of hospital) on 05/01/2023 at 10:45am. Please arrive 30 minutes prior to your appointment for registration. Make certain not to have anything to eat or drink after midnight to your appointment. Should you need to reschedule your appointment, please contact radiology at [phone]. This test typically takes about 30 minutes to perform.   Your provider has requested that you go to the basement level for lab work before leaving today. Press "B" on the elevator. The lab is located at the first door on the left as you exit the elevator.   Due to recent changes in healthcare laws, you may see the results of your imaging and laboratory studies on MyChart before your provider has had a chance to review them.  We understand that in some cases there may be results that are confusing or concerning to you. Not all laboratory results come back in the same time frame and the provider may be waiting for multiple results in order to interpret others.  Please give [place] 48 hours in order for your provider to thoroughly review all the results before contacting the office for clarification of your results.    I appreciate the  opportunity to care for you  Thank You   Marsa Aris , MD

## 2023-04-30 NOTE — Progress Notes (Signed)
Meredith [name]    [id number]    May 16, 1951  Primary Care Physician:Murray, Allyne Gee, FNP  Referring Physician: Olive Bass, FNP [address] [place],  [place] [address]   Chief complaint: Anemia Chief Complaint  Patient presents with   Abdominal Pain    Severe intermittent episodes of RUQ abd pain that radiates upward. Patient states at her most recent episode she took a baby aspirin and eased off and lasted 15 minutes then it came back the next day.    HPI: 72 year old very pleasant female here with complaints of worsening right upper quadrant abdominal pain I last saw her on 12-04-20. At that time, she had occasional blood in stool after having a BM, most likely due to hemorrhoids.   Today, she complains of RUQ pain that has waxes and wanes for the past year and would occasionally wake her up. She had an episode of sever RUQ pain this past weekend; her episode was so sever that she had to call 911 (she canceled it once the pain eased). She's unsure if food is triggering her episodes but states that she didn't have any out of her usual/normal foods.   She reports an episodes of vomiting few months ago where she vomited a large amount of food. She also complains of abdominal bloating and constipation; she denies taking any stool softener. She reports typically has a BM 1-2x a week.  GI Hx: Colonoscopy 01-23-21 - Diverticulosis in the sigmoid colon, in the descending colon and in the transverse colon.  - One 3 mm polyp in the rectum, removed with a cold snare. Resected and retrieved.  - Non-bleeding internal hemorrhoids. Surgical [P], colon, rectum, polyp - HYPERPLASTIC POLYP. - NO ADENOMATOUS CHANGE OR CARCINOMA.  EGD 01-23-21 - Z-line regular, 35 cm from the incisors.  - Non-bleeding gastric ulcer with no stigmata of bleeding.  - Gastritis. Biopsied.  - Erythematous duodenopathy. 1. Surgical [P], gastric - ANTRAL AND OXYNTIC MUCOSA  WITH SLIGHT CHRONIC INFLAMMATION. Ninetta Lights NEGATIVE FOR HELICOBACTER PYLORI. - NO INTESTINAL METAPLASIA, DYSPLASIA OR CARCINOMA.  EGD and colonoscopy by Dr. Juanda Chance in 2015: No source for potential GI blood loss other than gastric erosions in the antrum.  10 mm hyperplastic polyp removed from sigmoid colon  Gastric biopsies showed mild chronic gastritis.  Duodenal biopsies were normal.  She takes aspirin 81 mg daily and also takes NSAIDs intermittently for arthritis  CBC    Component Value Date/Time   WBC 6.3 03/11/2023 1252   RBC 3.88 03/11/2023 1252   RBC 3.84 (L) 03/11/2023 1252   HGB 11.9 (L) 03/11/2023 1252   HGB 11.8 (L) 09/05/2022 1418   HCT 35.2 (L) 03/11/2023 1252   PLT 236 03/11/2023 1252   PLT 246 09/05/2022 1418   MCV 91.7 03/11/2023 1252   MCV 83.5 03/15/2016 1942   MCH 31.0 03/11/2023 1252   MCHC 33.8 03/11/2023 1252   RDW 12.8 03/11/2023 1252   LYMPHSABS 1.4 03/11/2023 1252   MONOABS 0.4 03/11/2023 1252   EOSABS 0.2 03/11/2023 1252   BASOSABS 0.1 03/11/2023 1252   Iron/TIBC/Ferritin/ %Sat    Component Value Date/Time   IRON 85 03/11/2023 1252   TIBC 346 03/11/2023 1252   FERRITIN 82 03/11/2023 1252   IRONPCTSAT 25 03/11/2023 1252   IRONPCTSAT 10 (L) 06/15/2014 1629    Current Outpatient Medications:    ALPRAZolam (XANAX) 0.5 MG tablet, Take 1 tablet (0.5 mg total)  by mouth 2 (two) times daily as needed for anxiety., Disp: 60 tablet, Rfl: 0   amLODipine (NORVASC) 2.5 MG tablet, TAKE 1 TABLET BY MOUTH EVERY DAY, Disp: 90 tablet, Rfl: 3   aspirin 81 MG tablet, Take 81 mg by mouth daily., Disp: , Rfl:    escitalopram (LEXAPRO) 20 MG tablet, Take 1 tablet (20 mg total) by mouth daily., Disp: 90 tablet, Rfl: 3   furosemide (LASIX) 20 MG tablet, Take 1 tablet (20 mg total) by mouth 2 (two) times daily., Disp: 180 tablet, Rfl: 3   irbesartan (AVAPRO) 300 MG tablet, Take 1 tablet (300 mg total) by mouth daily., Disp: 90 tablet, Rfl: 3   linagliptin  (TRADJENTA) 5 MG TABS tablet, Take 1 tablet (5 mg total) by mouth daily., Disp: 90 tablet, Rfl: 3   metFORMIN (GLUCOPHAGE) 1000 MG tablet, TAKE 1 TABLET BY MOUTH TWICE A DAY WITH FOOD, Disp: 180 tablet, Rfl: 3   rosuvastatin (CRESTOR) 10 MG tablet, Take 1 tablet (10 mg total) by mouth daily., Disp: 90 tablet, Rfl: 3    Allergies as of 04/30/2023   (No Known Allergies)    Past Medical History:  Diagnosis Date   Anemia    Anxiety state, unspecified    Arthritis    KNEES   B12 deficiency anemia 05/2014 dx   CIN I (cervical intraepithelial neoplasia I)    Depressive disorder, not elsewhere classified    Diabetes mellitus without complication (HCC)    Emphysema of lung (HCC)    Gallstones    Hypertension    Internal hemorrhoids    Other and unspecified hyperlipidemia    Stomach ulcer    Unspecified sleep apnea     Past Surgical History:  Procedure Laterality Date   cataract surg Right    CERVICAL CERCLAGE     CESAREAN SECTION     COLONOSCOPY     HEMORRHOID SURGERY     KNEE ARTHROSCOPY Left 12/2014   UPPER GASTROINTESTINAL ENDOSCOPY      Family History  Problem Relation Age of Onset   Multiple sclerosis Mother    Heart attack Father 25   Diabetes Father    Cancer Father        lung   Heart disease Father     Social History   Socioeconomic History   Marital status: Divorced    Spouse name: Not on file   Number of children: 3   Years of education: 14   Highest education level: Not on file  Occupational History   Occupation: Games developer: A [employer]  Tobacco Use   Smoking status: Former    Packs/day: 1.00    Years: 30.00    Additional pack years: 0.00    Total pack years: 30.00    Types: Cigarettes   Smokeless tobacco: Never  Vaping Use   Vaping Use: Never used  Substance and Sexual Activity   Alcohol use: Yes    Alcohol/week: 6.0 standard drinks of alcohol    Types: 6 Glasses of wine per week    Comment: 6 glasses  or wine each week   Drug use: No   Sexual activity: Not Currently    Birth control/protection: Post-menopausal    Comment: 1st intercourse 72 yo-Fewer than 5 partners  Other Topics Concern   Not on file  Social History Narrative   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 BA- psych from [employer]. Married - 14 years -divorced. 3  daughters - twins 5 - one dtr,Charlotte, died 2012/06/15 unknown causes, 40   Work: Artist substance abuse Veterinary surgeon. No h/o physical or sexual abuse   Social Determinants of Health   Financial Resource Strain: Low Risk  (06/28/2022)   Overall Financial Resource Strain (CARDIA)    Difficulty of Paying Living Expenses: Not hard at all  Food Insecurity: No Food Insecurity (06/28/2022)   Hunger Vital Sign    Worried About Running Out of Food in the Last Year: Never true    Ran Out of Food in the Last Year: Never true  Transportation Needs: No Transportation Needs (06/28/2022)   PRAPARE - Administrator, Civil Service (Medical): No    Lack of Transportation (Non-Medical): No  Physical Activity: Insufficiently Active (06/28/2022)   Exercise Vital Sign    Days of Exercise per Week: 7 days    Minutes of Exercise per Session: 20 min  Stress: No Stress Concern Present (06/28/2022)   [employer] of Occupational Health - Occupational Stress Questionnaire    Feeling of Stress : Not at all  Social Connections: Socially Isolated (06/28/2022)   Social Connection and Isolation Panel [NHANES]    Frequency of Communication with Friends and Family: More than three times a week    Frequency of Social Gatherings with Friends and Family: Twice a week    Attends Religious Services: Never    Database administrator or Organizations: No    Attends Banker Meetings: Never    Marital Status: Divorced  Catering manager Violence: Not At Risk (06/28/2022)   Humiliation, Afraid, Rape, and Kick questionnaire    Fear of Current or  Ex-Partner: No    Emotionally Abused: No    Physically Abused: No    Sexually Abused: No      Review of systems: Review of Systems  Constitutional:  Negative for unexpected weight change.  HENT:  Negative for trouble swallowing.   Gastrointestinal:  Positive for abdominal distention (bloating), abdominal pain, constipation and vomiting. Negative for anal bleeding, blood in stool, diarrhea, nausea and rectal pain.      Physical Exam: Vitals:   04/30/23 1001  BP: 124/62   Body mass index is 33.28 kg/m. General: well-appearing   Eyes: sclera anicteric, no redness ENT: oral mucosa moist without lesions, no cervical or supraclavicular lymphadenopathy CV: RRR, no JVD, minimal peripheral edema Resp: clear to auscultation bilaterally, normal RR and effort noted GI: soft, RUQ tenderness, with active bowel sounds. No guarding or palpable organomegaly noted. Skin; warm and dry, no rash or jaundice noted Neuro: awake, alert and oriented x 3. Normal gross motor function and fluent speech   Data Reviewed:  Reviewed labs, radiology imaging, old records and pertinent past GI work up   Assessment and Plan/Recommendations:  73 year old very pleasant female with chronic iron deficiency and B12 deficiency anemia with complaints of worsening right upper quadrant abdominal pain  Reviewed recent EGD and colonoscopy from 06-15-2021, no high risk lesions Obtain abdominal ultrasound and HIDA scan to exclude gallbladder dysfunction Follow-up CBC and CMP Chronic idiopathic constipation: Use daily stool softener Increase dietary water and fiber intake    The patient was provided an opportunity to ask questions and all were answered. The patient agreed with the plan and demonstrated an understanding of the instructions.   I,Safa M Kadhim,acting as a scribe for Marsa Aris, MD.,have documented all relevant documentation on the behalf of Marsa Aris, MD,as directed by  Marsa Aris, MD  while in  the presence of Marsa Aris, MD.   I, Marsa Aris, MD, have reviewed all documentation for this visit. The documentation on 04/30/23 for the exam, diagnosis, procedures, and orders are all accurate and complete.   Iona Beard , MD    CC: Olive Bass,*

## 2023-05-01 ENCOUNTER — Ambulatory Visit (HOSPITAL_COMMUNITY)
Admission: RE | Admit: 2023-05-01 | Discharge: 2023-05-01 | Disposition: A | Discharge status: Home / Self Care | Payer: Medicare Other | Source: Ambulatory Visit | Attending: Gastroenterology | Admitting: Gastroenterology

## 2023-05-01 ENCOUNTER — Encounter (HOSPITAL_COMMUNITY): Payer: Self-pay

## 2023-05-01 DIAGNOSIS — K5904 Chronic idiopathic constipation: Secondary | ICD-10-CM

## 2023-05-01 DIAGNOSIS — R1011 Right upper quadrant pain: Secondary | ICD-10-CM

## 2023-05-01 DIAGNOSIS — K8080 Other cholelithiasis without obstruction: Secondary | ICD-10-CM

## 2023-05-03 ENCOUNTER — Ambulatory Visit (HOSPITAL_COMMUNITY)
Admission: RE | Admit: 2023-05-03 | Discharge: 2023-05-03 | Disposition: A | Discharge status: Home / Self Care | Payer: Medicare Other | Source: Ambulatory Visit | Attending: Gastroenterology | Admitting: Gastroenterology

## 2023-05-03 DIAGNOSIS — K8080 Other cholelithiasis without obstruction: Secondary | ICD-10-CM | POA: Diagnosis not present

## 2023-05-03 DIAGNOSIS — K5904 Chronic idiopathic constipation: Secondary | ICD-10-CM | POA: Diagnosis not present

## 2023-05-03 DIAGNOSIS — R1011 Right upper quadrant pain: Secondary | ICD-10-CM | POA: Insufficient documentation

## 2023-05-03 DIAGNOSIS — Z0389 Encounter for observation for other suspected diseases and conditions ruled out: Secondary | ICD-10-CM | POA: Diagnosis not present

## 2023-05-06 ENCOUNTER — Telehealth: Payer: Self-pay | Admitting: Gastroenterology

## 2023-05-06 NOTE — Telephone Encounter (Signed)
Patient was seen 5/1. She has had labs and an abdominal u/s. The provider has not made any recommendations. Called the patient to discuss her concerns/questions. No answer. Left her a message of my call.

## 2023-05-06 NOTE — Telephone Encounter (Signed)
Patient is calling has questions regarding if Dr Lavon Paganini is going to wait until her appointment in August to schedule another test for her. Seeking clarity from a nurse. Please advise

## 2023-05-07 ENCOUNTER — Encounter: Payer: Self-pay | Admitting: Gastroenterology

## 2023-05-07 NOTE — Telephone Encounter (Signed)
Please advise patient to do bowel purge, she has chronic constipation, likely etiology for worsening abdominal bloating and discomfort.  Thank you

## 2023-05-07 NOTE — Telephone Encounter (Signed)
Advised of her normal abdominal u/s. Patient is pleased with this. She reports she is having bloating and the abdominal discomfort is now on her left side. Describes her discomfort as a "pressing ache." Patient is scheduled for her follow up in August. She asks if she will be able to have this investigated further before August.

## 2023-05-08 NOTE — Telephone Encounter (Signed)
Spoke with the patient and instructed. Written instructions sent through My Chart.

## 2023-06-11 DIAGNOSIS — H35363 Drusen (degenerative) of macula, bilateral: Secondary | ICD-10-CM | POA: Diagnosis not present

## 2023-06-11 DIAGNOSIS — H26493 Other secondary cataract, bilateral: Secondary | ICD-10-CM | POA: Diagnosis not present

## 2023-06-11 DIAGNOSIS — H40013 Open angle with borderline findings, low risk, bilateral: Secondary | ICD-10-CM | POA: Diagnosis not present

## 2023-06-11 DIAGNOSIS — E119 Type 2 diabetes mellitus without complications: Secondary | ICD-10-CM | POA: Diagnosis not present

## 2023-07-17 DIAGNOSIS — M7711 Lateral epicondylitis, right elbow: Secondary | ICD-10-CM | POA: Diagnosis not present

## 2023-08-12 DIAGNOSIS — M25511 Pain in right shoulder: Secondary | ICD-10-CM | POA: Diagnosis not present

## 2023-08-12 DIAGNOSIS — M542 Cervicalgia: Secondary | ICD-10-CM | POA: Diagnosis not present

## 2023-08-15 NOTE — Progress Notes (Incomplete)
Meredith [name]    [id number]    08/10/1951  Primary Care Physician:Murray, Allyne Gee, FNP  Referring Physician: Olive Bass, FNP [address] [place],  [place] [address]   Chief complaint: Anemia No chief complaint on file.  HPI: 72 year old very pleasant female here with complaints of right upper quadrant abdominal pain  Last seen on 04-30-23.  Today,    GI Hx: Colonoscopy 01-23-21 - Diverticulosis in the sigmoid colon, in the descending colon and in the transverse colon.  - One 3 mm polyp in the rectum, removed with a cold snare. Resected and retrieved.  - Non-bleeding internal hemorrhoids. Surgical [P], colon, rectum, polyp - HYPERPLASTIC POLYP. - NO ADENOMATOUS CHANGE OR CARCINOMA.  EGD 01-23-21 - Z-line regular, 35 cm from the incisors.  - Non-bleeding gastric ulcer with no stigmata of bleeding.  - Gastritis. Biopsied.  - Erythematous duodenopathy. 1. Surgical [P], gastric - ANTRAL AND OXYNTIC MUCOSA WITH SLIGHT CHRONIC INFLAMMATION. Ninetta Lights NEGATIVE FOR HELICOBACTER PYLORI. - NO INTESTINAL METAPLASIA, DYSPLASIA OR CARCINOMA.  EGD and colonoscopy by Dr. Juanda Chance in 2015: No source for potential GI blood loss other than gastric erosions in the antrum.  10 mm hyperplastic polyp removed from sigmoid colon  Gastric biopsies showed mild chronic gastritis.  Duodenal biopsies were normal.  She takes aspirin 81 mg daily and also takes NSAIDs intermittently for arthritis  CBC    Component Value Date/Time   WBC 7.6 04/30/2023 1047   RBC 3.88 04/30/2023 1047   HGB 11.7 (L) 04/30/2023 1047   HGB 11.8 (L) 09/05/2022 1418   HCT 35.3 (L) 04/30/2023 1047   PLT 225.0 04/30/2023 1047   PLT 246 09/05/2022 1418   MCV 91.0 04/30/2023 1047   MCV 83.5 03/15/2016 1942   MCH 31.0 03/11/2023 1252   MCHC 33.0 04/30/2023 1047   RDW 13.4 04/30/2023 1047   LYMPHSABS 0.9 04/30/2023 1047   MONOABS 0.5 04/30/2023 1047   EOSABS 0.3  04/30/2023 1047   BASOSABS 0.1 04/30/2023 1047   Iron/TIBC/Ferritin/ %Sat    Component Value Date/Time   IRON 85 03/11/2023 1252   TIBC 346 03/11/2023 1252   FERRITIN 82 03/11/2023 1252   IRONPCTSAT 25 03/11/2023 1252   IRONPCTSAT 10 (L) 06/15/2014 1629    Current Outpatient Medications:    ALPRAZolam (XANAX) 0.5 MG tablet, Take 1 tablet (0.5 mg total) by mouth 2 (two) times daily as needed for anxiety., Disp: 60 tablet, Rfl: 0   amLODipine (NORVASC) 2.5 MG tablet, TAKE 1 TABLET BY MOUTH EVERY DAY, Disp: 90 tablet, Rfl: 3   aspirin 81 MG tablet, Take 81 mg by mouth daily., Disp: , Rfl:    escitalopram (LEXAPRO) 20 MG tablet, Take 1 tablet (20 mg total) by mouth daily., Disp: 90 tablet, Rfl: 3   furosemide (LASIX) 20 MG tablet, Take 1 tablet (20 mg total) by mouth 2 (two) times daily., Disp: 180 tablet, Rfl: 3   irbesartan (AVAPRO) 300 MG tablet, Take 1 tablet (300 mg total) by mouth daily., Disp: 90 tablet, Rfl: 3   linagliptin (TRADJENTA) 5 MG TABS tablet, Take 1 tablet (5 mg total) by mouth daily., Disp: 90 tablet, Rfl: 3   metFORMIN (GLUCOPHAGE) 1000 MG tablet, TAKE 1 TABLET BY MOUTH TWICE A DAY WITH FOOD, Disp: 180 tablet, Rfl: 3   rosuvastatin (CRESTOR) 10 MG tablet, Take 1 tablet (10 mg total) by mouth daily., Disp: 90 tablet, Rfl: 3  Allergies as of 08/18/2023   (No Known Allergies)    Past Medical History:  Diagnosis Date   Anemia    Anxiety state, unspecified    Arthritis    KNEES   B12 deficiency anemia 05/2014 dx   CIN I (cervical intraepithelial neoplasia I)    Depressive disorder, not elsewhere classified    Diabetes mellitus without complication (HCC)    Emphysema of lung (HCC)    Gallstones    Hypertension    Internal hemorrhoids    Other and unspecified hyperlipidemia    Stomach ulcer    Unspecified sleep apnea     Past Surgical History:  Procedure Laterality Date   cataract surg Right    CERVICAL CERCLAGE     CESAREAN SECTION     COLONOSCOPY      HEMORRHOID SURGERY     KNEE ARTHROSCOPY Left 12/2014   UPPER GASTROINTESTINAL ENDOSCOPY      Family History  Problem Relation Age of Onset   Multiple sclerosis Mother    Heart attack Father 51   Diabetes Father    Cancer Father        lung   Heart disease Father     Social History   Socioeconomic History   Marital status: Divorced    Spouse name: Not on file   Number of children: 3   Years of education: 14   Highest education level: Not on file  Occupational History   Occupation: Games developer: A [employer]  Tobacco Use   Smoking status: Former    Current packs/day: 1.00    Average packs/day: 1 pack/day for 30.0 years (30.0 ttl pk-yrs)    Types: Cigarettes   Smokeless tobacco: Never  Vaping Use   Vaping status: Never Used  Substance and Sexual Activity   Alcohol use: Yes    Alcohol/week: 6.0 standard drinks of alcohol    Types: 6 Glasses of wine per week    Comment: 6 glasses or wine each week   Drug use: No   Sexual activity: Not Currently    Birth control/protection: Post-menopausal    Comment: 1st intercourse 72 yo-Fewer than 5 partners  Other Topics Concern   Not on file  Social History Narrative   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 BA- psych from [employer]. Married - 14 years -divorced. 3 daughters - twins 66 - one dtr,Charlotte, died September 19, 2012 unknown causes, 48   Work: Artist substance abuse Veterinary surgeon. No h/o physical or sexual abuse   Social Determinants of Health   Financial Resource Strain: Low Risk  (06/28/2022)   Overall Financial Resource Strain (CARDIA)    Difficulty of Paying Living Expenses: Not hard at all  Food Insecurity: No Food Insecurity (06/28/2022)   Hunger Vital Sign    Worried About Running Out of Food in the Last Year: Never true    Ran Out of Food in the Last Year: Never true  Transportation Needs: No Transportation Needs (06/28/2022)   PRAPARE -  Administrator, Civil Service (Medical): No    Lack of Transportation (Non-Medical): No  Physical Activity: Insufficiently Active (06/28/2022)   Exercise Vital Sign    Days of Exercise per Week: 7 days    Minutes of Exercise per Session: 20 min  Stress: No Stress Concern Present (06/28/2022)   [employer] of Occupational Health - Occupational Stress Questionnaire    Feeling of Stress : Not at all  Social  Connections: Socially Isolated (06/28/2022)   Social Connection and Isolation Panel [NHANES]    Frequency of Communication with Friends and Family: More than three times a week    Frequency of Social Gatherings with Friends and Family: Twice a week    Attends Religious Services: Never    Database administrator or Organizations: No    Attends Banker Meetings: Never    Marital Status: Divorced  Catering manager Violence: Not At Risk (06/28/2022)   Humiliation, Afraid, Rape, and Kick questionnaire    Fear of Current or Ex-Partner: No    Emotionally Abused: No    Physically Abused: No    Sexually Abused: No      Review of systems: Review of Systems   Physical Exam: There were no vitals filed for this visit.  There is no height or weight on file to calculate BMI. General: well-appearing   Eyes: sclera anicteric, no redness ENT: oral mucosa moist without lesions, no cervical or supraclavicular lymphadenopathy CV: RRR, no JVD, minimal peripheral edema Resp: clear to auscultation bilaterally, normal RR and effort noted GI: soft, RUQ tenderness, with active bowel sounds. No guarding or palpable organomegaly noted. Skin; warm and dry, no rash or jaundice noted Neuro: awake, alert and oriented x 3. Normal gross motor function and fluent speech   Data Reviewed:  Reviewed labs, radiology imaging, old records and pertinent past GI work up   Assessment and Plan/Recommendations:  72 year old very pleasant female with chronic iron deficiency and B12  deficiency anemia with complaints of worsening right upper quadrant abdominal pain  Reviewed recent EGD and colonoscopy from 2022, no high risk lesions Obtain abdominal ultrasound and HIDA scan to exclude gallbladder dysfunction Follow-up CBC and CMP Chronic idiopathic constipation: Use daily stool softener Increase dietary water and fiber intake    The patient was provided an opportunity to ask questions and all were answered. The patient agreed with the plan and demonstrated an understanding of the instructions.   I,Safa M Kadhim,acting as a scribe for Marsa Aris, MD.,have documented all relevant documentation on the behalf of Marsa Aris, MD,as directed by  Marsa Aris, MD while in the presence of Marsa Aris, MD.   I, Marsa Aris, MD, have reviewed all documentation for this visit. The documentation on 08/15/23 for the exam, diagnosis, procedures, and orders are all accurate and complete.   Iona Beard , MD    CC: Olive Bass,*

## 2023-08-18 ENCOUNTER — Ambulatory Visit: Payer: Medicare Other | Admitting: Gastroenterology

## 2023-09-09 ENCOUNTER — Inpatient Hospital Stay: Payer: Medicare Other | Attending: Internal Medicine

## 2023-09-09 DIAGNOSIS — D519 Vitamin B12 deficiency anemia, unspecified: Secondary | ICD-10-CM | POA: Insufficient documentation

## 2023-09-09 DIAGNOSIS — E119 Type 2 diabetes mellitus without complications: Secondary | ICD-10-CM | POA: Diagnosis not present

## 2023-09-09 DIAGNOSIS — D509 Iron deficiency anemia, unspecified: Secondary | ICD-10-CM | POA: Diagnosis not present

## 2023-09-09 LAB — CBC WITH DIFFERENTIAL (CANCER CENTER ONLY)
Abs Immature Granulocytes: 0.02 10*3/uL (ref 0.00–0.07)
Basophils Absolute: 0.1 10*3/uL (ref 0.0–0.1)
Basophils Relative: 1 %
Eosinophils Absolute: 0.2 10*3/uL (ref 0.0–0.5)
Eosinophils Relative: 4 %
HCT: 33.3 % — ABNORMAL LOW (ref 36.0–46.0)
Hemoglobin: 11.4 g/dL — ABNORMAL LOW (ref 12.0–15.0)
Immature Granulocytes: 0 %
Lymphocytes Relative: 25 %
Lymphs Abs: 1.4 10*3/uL (ref 0.7–4.0)
MCH: 31.5 pg (ref 26.0–34.0)
MCHC: 34.2 g/dL (ref 30.0–36.0)
MCV: 92 fL (ref 80.0–100.0)
Monocytes Absolute: 0.5 10*3/uL (ref 0.1–1.0)
Monocytes Relative: 9 %
Neutro Abs: 3.5 10*3/uL (ref 1.7–7.7)
Neutrophils Relative %: 61 %
Platelet Count: 231 10*3/uL (ref 150–400)
RBC: 3.62 MIL/uL — ABNORMAL LOW (ref 3.87–5.11)
RDW: 13.1 % (ref 11.5–15.5)
WBC Count: 5.8 10*3/uL (ref 4.0–10.5)
nRBC: 0 % (ref 0.0–0.2)

## 2023-09-09 LAB — IRON AND IRON BINDING CAPACITY (CC-WL,HP ONLY)
Iron: 53 ug/dL (ref 28–170)
Saturation Ratios: 15 % (ref 10.4–31.8)
TIBC: 353 ug/dL (ref 250–450)
UIBC: 300 ug/dL (ref 148–442)

## 2023-09-09 LAB — FERRITIN: Ferritin: 72 ng/mL (ref 11–307)

## 2023-09-09 LAB — VITAMIN B12: Vitamin B-12: 1110 pg/mL — ABNORMAL HIGH (ref 180–914)

## 2023-09-10 LAB — HEMOGLOBIN A1C
Hgb A1c MFr Bld: 6.4 % — ABNORMAL HIGH (ref 4.8–5.6)
Mean Plasma Glucose: 137 mg/dL

## 2023-09-11 ENCOUNTER — Other Ambulatory Visit: Payer: Self-pay

## 2023-09-11 ENCOUNTER — Inpatient Hospital Stay (HOSPITAL_BASED_OUTPATIENT_CLINIC_OR_DEPARTMENT_OTHER): Payer: Medicare Other | Admitting: Hematology and Oncology

## 2023-09-11 ENCOUNTER — Encounter: Payer: Self-pay | Admitting: Hematology and Oncology

## 2023-09-11 VITALS — BP 135/61 | HR 65 | Temp 98.7°F | Resp 18 | Ht 65.0 in | Wt 201.6 lb

## 2023-09-11 DIAGNOSIS — D509 Iron deficiency anemia, unspecified: Secondary | ICD-10-CM

## 2023-09-11 DIAGNOSIS — E119 Type 2 diabetes mellitus without complications: Secondary | ICD-10-CM

## 2023-09-11 DIAGNOSIS — D519 Vitamin B12 deficiency anemia, unspecified: Secondary | ICD-10-CM

## 2023-09-11 NOTE — Progress Notes (Signed)
[facility] OFFICE PROGRESS NOTE  Olive Bass, FNP  ASSESSMENT & PLAN:  Iron deficiency anemia We reviewed test results. Her iron studies are adequate even though she is mildly anemic.  Her hemoglobin is near normal.  She does not need iron infusion  B12 deficiency anemia Her vitamin B12 level was borderline low but now has improved/high I recommend oral vitamin B12 supplement at this frequency and I plan to recheck in her next visit  Diabetes mellitus type 2, controlled, without complications (HCC) I suspect her diabetes is contributing to anemia chronic illness I recommend dietary modification and weight loss  Orders Placed This Encounter  Procedures   CBC with Differential (Cancer Center Only)    Standing Status:   Future    Standing Expiration Date:   09/10/2024   Reticulocytes    Standing Status:   Future    Standing Expiration Date:   09/10/2024   Vitamin B12    Standing Status:   Future    Standing Expiration Date:   09/10/2024   Iron and Iron Binding Capacity ([facility] only)    Standing Status:   Future    Standing Expiration Date:   09/10/2024   Ferritin    Standing Status:   Future    Standing Expiration Date:   09/10/2024   Sedimentation rate    Standing Status:   Future    Standing Expiration Date:   09/10/2024    The total time spent in the appointment was 20 minutes encounter with patients including review of chart and various tests results, discussions about plan of care and coordination of care plan   All questions were answered. The patient knows to call the clinic with any problems, questions or concerns. No barriers to learning was detected.    Artis Delay, MD 9/12/20243:04 PM  INTERVAL HISTORY: Meredith [name] 72 y.o. female returns for further follow-up for chronic anemia with history of iron deficiency and B12 deficiency She feels okay Denies recent bleeding We spent majority of her time reviewing test results  SUMMARY OF  HEMATOLOGIC HISTORY:  She was found to have abnormal CBC from recent blood work I have the opportunity to review her CBC dated back to June 2015 Hemoglobin ranged from 10.3-11.9 Her most recent blood work show evidence of severe iron deficiency She had EGD and colonoscopy performed in 2015 which showed no evidence of GI bleed She denies recent chest pain on exertion, but does complain of shortness of breath on minimal exertion and excessive fatigue. She had not noticed any recent bleeding such as epistaxis but did states she has occasional hemorrhoidal bleeding once a month She had taken NSAID in the past for chronic joint pain but nothing recently.  She is on chronic aspirin therapy  She had history of abnormal PAP smear but no prior history of cancer. Her age appropriate screening programs are up-to-date. She eats a variety of diet She used to donate blood a lot in the past but nothing in the last 20 years She has pica and craves ice The patient was prescribed oral iron supplements and she takes in the past but that cause significant constipation Between December 2021 to 2023 she received multiple doses of IV iron; she received her final dose of IV iron in September 2023  I have reviewed the past medical history, past surgical history, social history and family history with the patient and they are unchanged from previous note.  ALLERGIES:  has No Known  Allergies.  MEDICATIONS:  Current Outpatient Medications  Medication Sig Dispense Refill   ALPRAZolam (XANAX) 0.5 MG tablet Take 1 tablet (0.5 mg total) by mouth 2 (two) times daily as needed for anxiety. 60 tablet 0   amLODipine (NORVASC) 2.5 MG tablet TAKE 1 TABLET BY MOUTH EVERY DAY 90 tablet 3   aspirin 81 MG tablet Take 81 mg by mouth daily.     escitalopram (LEXAPRO) 20 MG tablet Take 1 tablet (20 mg total) by mouth daily. 90 tablet 3   furosemide (LASIX) 20 MG tablet Take 1 tablet (20 mg total) by mouth 2 (two) times daily. 180  tablet 3   irbesartan (AVAPRO) 300 MG tablet Take 1 tablet (300 mg total) by mouth daily. 90 tablet 3   linagliptin (TRADJENTA) 5 MG TABS tablet Take 1 tablet (5 mg total) by mouth daily. 90 tablet 3   metFORMIN (GLUCOPHAGE) 1000 MG tablet TAKE 1 TABLET BY MOUTH TWICE A DAY WITH FOOD 180 tablet 3   rosuvastatin (CRESTOR) 10 MG tablet Take 1 tablet (10 mg total) by mouth daily. 90 tablet 3   No current facility-administered medications for this visit.     REVIEW OF SYSTEMS:   Constitutional: Denies fevers, chills or night sweats Eyes: Denies blurriness of vision Ears, nose, mouth, throat, and face: Denies mucositis or sore throat Respiratory: Denies cough, dyspnea or wheezes Cardiovascular: Denies palpitation, chest discomfort or lower extremity swelling Gastrointestinal:  Denies nausea, heartburn or change in bowel habits Skin: Denies abnormal skin rashes Lymphatics: Denies new lymphadenopathy or easy bruising Neurological:Denies numbness, tingling or new weaknesses Behavioral/Psych: Mood is stable, no new changes  All other systems were reviewed with the patient and are negative.  PHYSICAL EXAMINATION: ECOG PERFORMANCE STATUS: 0 - Asymptomatic  Vitals:   09/11/23 1247  BP: 135/61  Pulse: 65  Resp: 18  Temp: 98.7 F (37.1 C)  SpO2: 99%   Filed Weights   09/11/23 1247  Weight: 201 lb 9.6 oz (91.4 kg)    GENERAL:alert, no distress and comfortable  LABORATORY DATA:  I have reviewed the data as listed     Component Value Date/Time   NA 139 04/30/2023 1047   K 3.7 04/30/2023 1047   CL 103 04/30/2023 1047   CO2 28 04/30/2023 1047   GLUCOSE 156 (H) 04/30/2023 1047   BUN 16 04/30/2023 1047   CREATININE 0.75 04/30/2023 1047   CREATININE 0.78 11/02/2021 1602   CALCIUM 9.3 04/30/2023 1047   PROT 7.0 04/30/2023 1047   ALBUMIN 4.0 04/30/2023 1047   AST 17 04/30/2023 1047   ALT 12 04/30/2023 1047   ALKPHOS 63 04/30/2023 1047   BILITOT 0.4 04/30/2023 1047   GFRNONAA >60  06/02/2022 2107    No results found for: "SPEP", "UPEP"  Lab Results  Component Value Date   WBC 5.8 09/09/2023   NEUTROABS 3.5 09/09/2023   HGB 11.4 (L) 09/09/2023   HCT 33.3 (L) 09/09/2023   MCV 92.0 09/09/2023   PLT 231 09/09/2023      Chemistry      Component Value Date/Time   NA 139 04/30/2023 1047   K 3.7 04/30/2023 1047   CL 103 04/30/2023 1047   CO2 28 04/30/2023 1047   BUN 16 04/30/2023 1047   CREATININE 0.75 04/30/2023 1047   CREATININE 0.78 11/02/2021 1602      Component Value Date/Time   CALCIUM 9.3 04/30/2023 1047   ALKPHOS 63 04/30/2023 1047   AST 17 04/30/2023 1047   ALT 12 04/30/2023  1047   BILITOT 0.4 04/30/2023 1047

## 2023-09-11 NOTE — Assessment & Plan Note (Signed)
I suspect her diabetes is contributing to anemia chronic illness I recommend dietary modification and weight loss

## 2023-09-11 NOTE — Assessment & Plan Note (Signed)
Her vitamin B12 level was borderline low but now has improved/high I recommend oral vitamin B12 supplement at this frequency and I plan to recheck in her next visit

## 2023-09-11 NOTE — Assessment & Plan Note (Signed)
We reviewed test results. Her iron studies are adequate even though she is mildly anemic.  Her hemoglobin is near normal.  She does not need iron infusion

## 2023-09-29 DIAGNOSIS — Z23 Encounter for immunization: Secondary | ICD-10-CM | POA: Diagnosis not present

## 2023-10-20 ENCOUNTER — Other Ambulatory Visit: Payer: Self-pay | Admitting: Family

## 2023-10-21 ENCOUNTER — Encounter: Payer: Self-pay | Admitting: Family

## 2023-10-21 ENCOUNTER — Ambulatory Visit (INDEPENDENT_AMBULATORY_CARE_PROVIDER_SITE_OTHER): Payer: Medicare Other | Admitting: Family

## 2023-10-21 ENCOUNTER — Telehealth (HOSPITAL_BASED_OUTPATIENT_CLINIC_OR_DEPARTMENT_OTHER): Payer: Self-pay

## 2023-10-21 VITALS — BP 132/78 | HR 82 | Resp 18 | Ht 65.0 in | Wt 197.4 lb

## 2023-10-21 DIAGNOSIS — E119 Type 2 diabetes mellitus without complications: Secondary | ICD-10-CM | POA: Diagnosis not present

## 2023-10-21 DIAGNOSIS — Z1231 Encounter for screening mammogram for malignant neoplasm of breast: Secondary | ICD-10-CM

## 2023-10-21 DIAGNOSIS — R490 Dysphonia: Secondary | ICD-10-CM | POA: Diagnosis not present

## 2023-10-21 DIAGNOSIS — I10 Essential (primary) hypertension: Secondary | ICD-10-CM

## 2023-10-21 DIAGNOSIS — L659 Nonscarring hair loss, unspecified: Secondary | ICD-10-CM

## 2023-10-21 MED ORDER — METFORMIN HCL 1000 MG PO TABS: 1000.0000 mg | ORAL_TABLET | Freq: Two times a day (BID) | ORAL | 0 refills | Status: DC

## 2023-10-21 MED ORDER — FUROSEMIDE 20 MG PO TABS: 20.0000 mg | ORAL_TABLET | Freq: Two times a day (BID) | ORAL | 3 refills | Status: DC

## 2023-10-21 MED ORDER — IRBESARTAN 300 MG PO TABS: 300.0000 mg | ORAL_TABLET | Freq: Every day | ORAL | 3 refills | Status: DC

## 2023-10-21 MED ORDER — ESCITALOPRAM OXALATE 20 MG PO TABS: 20.0000 mg | ORAL_TABLET | Freq: Every day | ORAL | 3 refills | Status: DC

## 2023-10-21 MED ORDER — LINAGLIPTIN 5 MG PO TABS: 5.0000 mg | ORAL_TABLET | Freq: Every day | ORAL | 3 refills | Status: DC

## 2023-10-21 MED ORDER — ROSUVASTATIN CALCIUM 10 MG PO TABS: 10.0000 mg | ORAL_TABLET | Freq: Every day | ORAL | 3 refills | Status: DC

## 2023-10-21 MED ORDER — ALPRAZOLAM 0.5 MG PO TABS: 0.5000 mg | ORAL_TABLET | Freq: Two times a day (BID) | ORAL | 0 refills | Status: DC | PRN

## 2023-10-21 MED ORDER — AMLODIPINE BESYLATE 2.5 MG PO TABS: ORAL_TABLET | ORAL | 3 refills | Status: DC

## 2023-10-21 NOTE — Progress Notes (Signed)
Meredith [name] is a 72 y.o. female with the following history as recorded in Epic[employer]:  Patient Active Problem List   Diagnosis Date Noted   DOE (dyspnea on exertion) 11/11/2021   Iron deficiency anemia 11/21/2020   Aortic atherosclerosis (HCC) 06/05/2020   Chest pain of uncertain etiology 01/10/2020   Educated about COVID-19 virus infection 01/10/2020   Bilateral primary osteoarthritis of knee 03/19/2019   B12 deficiency anemia    Other emphysema (HCC) 02/23/2014   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HCC) 12/31/2013   Essential hypertension, benign 11/05/2013   Routine health maintenance 09/27/2012   Vitamin D deficiency 06/20/2011   OBESITY, CLASS II 03/18/2011   Lung nodules 07/12/2010   Diabetes mellitus type 2, controlled, without complications (HCC) 11/02/2007   Hyperlipidemia 10/01/2007   Anxiety 10/01/2007   Depression 10/01/2007   OSA (obstructive sleep apnea) 10/01/2007    Current Outpatient Medications  Medication Sig Dispense Refill   aspirin 81 MG tablet Take 81 mg by mouth daily.     ALPRAZolam (XANAX) 0.5 MG tablet Take 1 tablet (0.5 mg total) by mouth 2 (two) times daily as needed for anxiety. 60 tablet 0   amLODipine (NORVASC) 2.5 MG tablet TAKE 1 TABLET BY MOUTH EVERY DAY 90 tablet 3   escitalopram (LEXAPRO) 20 MG tablet Take 1 tablet (20 mg total) by mouth daily. 90 tablet 3   furosemide (LASIX) 20 MG tablet Take 1 tablet (20 mg total) by mouth 2 (two) times daily. 180 tablet 3   irbesartan (AVAPRO) 300 MG tablet Take 1 tablet (300 mg total) by mouth daily. 90 tablet 3   linagliptin (TRADJENTA) 5 MG TABS tablet Take 1 tablet (5 mg total) by mouth daily. 90 tablet 3   metFORMIN (GLUCOPHAGE) 1000 MG tablet Take 1 tablet (1,000 mg total) by mouth 2 (two) times daily with a meal. 180 tablet 0   rosuvastatin (CRESTOR) 10 MG tablet Take 1 tablet (10 mg total) by mouth daily. 90 tablet 3   No current facility-administered medications for this visit.    Allergies:  Patient has no known allergies.  Past Medical History:  Diagnosis Date   Anemia    Anxiety state, unspecified    Arthritis    KNEES   B12 deficiency anemia 05/2014 dx   CIN I (cervical intraepithelial neoplasia I)    Depressive disorder, not elsewhere classified    Diabetes mellitus without complication (HCC)    Emphysema of lung (HCC)    Gallstones    Hypertension    Internal hemorrhoids    Other and unspecified hyperlipidemia    Stomach ulcer    Unspecified sleep apnea     Past Surgical History:  Procedure Laterality Date   cataract surg Right    CERVICAL CERCLAGE     CESAREAN SECTION     COLONOSCOPY     HEMORRHOID SURGERY     KNEE ARTHROSCOPY Left 12/2014   UPPER GASTROINTESTINAL ENDOSCOPY      Family History  Problem Relation Age of Onset   Multiple sclerosis Mother    Heart attack Father 82   Diabetes Father    Cancer Father        lung   Heart disease Father     Social History   Tobacco Use   Smoking status: Former    Current packs/day: 1.00    Average packs/day: 1 pack/day for 30.0 years (30.0 ttl pk-yrs)    Types: Cigarettes   Smokeless tobacco: Never  Substance Use Topics   Alcohol  use: Yes    Alcohol/week: 6.0 standard drinks of alcohol    Types: 6 Glasses of wine per week    Comment: 6 glasses or wine each week    Subjective:   Presents for yearly follow up; majority of care is managed by GI, hematology, GYN; does need medication refills updated today;  Is concerned about hair loss/ chronic hoarseness;   Objective:  Vitals:   10/21/23 1512  BP: 132/78  Pulse: 82  Resp: 18  SpO2: 98%  Weight: 197 lb 6.4 oz (89.5 kg)  Height: 5\' 5"  (1.651 m)    General: Well developed, well nourished, in no acute distress  Skin : Warm and dry.  Head: Normocephalic and atraumatic  Eyes: Sclera and conjunctiva clear; pupils round and reactive to light; extraocular movements intact  Ears: External normal; canals clear; tympanic membranes normal  Oropharynx:  Pink, supple. No suspicious lesions  Neck: Supple without thyromegaly, adenopathy  Lungs: Respirations unlabored; clear to auscultation bilaterally without wheeze, rales, rhonchi  CVS exam: normal rate and regular rhythm.  Neurologic: Alert and oriented; speech intact; face symmetrical; moves all extremities well; CNII-XII intact without focal deficit   Assessment:  1. Chronic hoarseness   2. Benign hypertension   3. Alopecia   4. Controlled type 2 diabetes mellitus without complication, without long-term current use of insulin (HCC)   5. Visit for screening mammogram     Plan:  Referral to ENT; will check TSH and thyroid ultrasound;  Stable; refill updated; If TSH is normal, to consider dermatology referral; Reviewed recent Hgba1c and agree good control at 6.4; Order for screening mammogram updated;   No follow-ups on file.  Orders Placed This Encounter  Procedures   US THYROID    Standing Status:   Future    Standing Expiration Date:   10/20/2024    Order Specific Question:   Reason for Exam (SYMPTOM  OR DIAGNOSIS REQUIRED)    Answer:   chronic hoarseness    Order Specific Question:   Preferred imaging location?    Answer:   [facility]   MM 3D SCREENING MAMMOGRAM BILATERAL BREAST    Standing Status:   Future    Standing Expiration Date:   10/20/2024    Order Specific Question:   Reason for Exam (SYMPTOM  OR DIAGNOSIS REQUIRED)    Answer:   screening mammogram    Order Specific Question:   Preferred imaging location?    Answer:   [facility]   TSH   Urine Microalbumin w/creat. ratio   Lipid panel   Ambulatory referral to ENT    Referral Priority:   Routine    Referral Type:   Consultation    Referral Reason:   Specialty Services Required    Requested Specialty:   Otolaryngology    Number of Visits Requested:   1    Requested Prescriptions   Signed Prescriptions Disp Refills   amLODipine (NORVASC) 2.5 MG tablet 90 tablet 3    Sig: TAKE 1 TABLET BY  MOUTH EVERY DAY   escitalopram (LEXAPRO) 20 MG tablet 90 tablet 3    Sig: Take 1 tablet (20 mg total) by mouth daily.   furosemide (LASIX) 20 MG tablet 180 tablet 3    Sig: Take 1 tablet (20 mg total) by mouth 2 (two) times daily.   irbesartan (AVAPRO) 300 MG tablet 90 tablet 3    Sig: Take 1 tablet (300 mg total) by mouth daily.   linagliptin (TRADJENTA) 5  MG TABS tablet 90 tablet 3    Sig: Take 1 tablet (5 mg total) by mouth daily.   metFORMIN (GLUCOPHAGE) 1000 MG tablet 180 tablet 0    Sig: Take 1 tablet (1,000 mg total) by mouth 2 (two) times daily with a meal.   rosuvastatin (CRESTOR) 10 MG tablet 90 tablet 3    Sig: Take 1 tablet (10 mg total) by mouth daily.   ALPRAZolam (XANAX) 0.5 MG tablet 60 tablet 0    Sig: Take 1 tablet (0.5 mg total) by mouth 2 (two) times daily as needed for anxiety.

## 2023-10-22 LAB — LIPID PANEL
Cholesterol: 159 mg/dL (ref 0–200)
HDL: 45.1 mg/dL (ref >39.00)
LDL Cholesterol: 63 mg/dL (ref 0–99)
NonHDL: 113.81
Total CHOL/HDL Ratio: 4
Triglycerides: 253 mg/dL — ABNORMAL HIGH (ref 0.0–149.0)
VLDL: 50.6 mg/dL — ABNORMAL HIGH (ref 0.0–40.0)

## 2023-10-22 LAB — MICROALBUMIN / CREATININE URINE RATIO
Creatinine,U: 15.1 mg/dL
Microalb Creat Ratio: 4.6 mg/g (ref 0.0–30.0)
Microalb, Ur: <0.7 mg/dL (ref 0.0–1.9)

## 2023-10-22 LAB — TSH: TSH: 1.45 u[IU]/mL (ref 0.35–5.50)

## 2023-10-30 ENCOUNTER — Encounter (HOSPITAL_BASED_OUTPATIENT_CLINIC_OR_DEPARTMENT_OTHER): Payer: Self-pay

## 2023-10-30 ENCOUNTER — Ambulatory Visit (HOSPITAL_BASED_OUTPATIENT_CLINIC_OR_DEPARTMENT_OTHER)
Admission: RE | Admit: 2023-10-30 | Discharge: 2023-10-30 | Disposition: A | Discharge status: Home / Self Care | Payer: Medicare Other | Source: Ambulatory Visit | Attending: Family | Admitting: Family

## 2023-10-30 DIAGNOSIS — E0789 Other specified disorders of thyroid: Secondary | ICD-10-CM | POA: Diagnosis not present

## 2023-10-30 DIAGNOSIS — R490 Dysphonia: Secondary | ICD-10-CM | POA: Diagnosis not present

## 2023-10-30 DIAGNOSIS — Z1231 Encounter for screening mammogram for malignant neoplasm of breast: Secondary | ICD-10-CM | POA: Diagnosis not present

## 2023-11-04 ENCOUNTER — Encounter: Payer: Self-pay | Admitting: Family

## 2023-11-11 ENCOUNTER — Other Ambulatory Visit: Payer: Self-pay | Admitting: Family

## 2023-11-11 DIAGNOSIS — M1712 Unilateral primary osteoarthritis, left knee: Secondary | ICD-10-CM | POA: Diagnosis not present

## 2023-11-11 DIAGNOSIS — R490 Dysphonia: Secondary | ICD-10-CM

## 2023-11-14 DIAGNOSIS — J069 Acute upper respiratory infection, unspecified: Secondary | ICD-10-CM | POA: Diagnosis not present

## 2023-11-14 DIAGNOSIS — R059 Cough, unspecified: Secondary | ICD-10-CM | POA: Diagnosis not present

## 2023-11-14 DIAGNOSIS — Z20822 Contact with and (suspected) exposure to covid-19: Secondary | ICD-10-CM | POA: Diagnosis not present

## 2023-11-17 ENCOUNTER — Telehealth: Payer: Self-pay | Admitting: Family

## 2023-11-17 NOTE — Telephone Encounter (Signed)
Patient called and would an ENT referral sent in for Dr. Suszanne Conners office not [facility] ent. Please advise patient

## 2023-11-18 ENCOUNTER — Other Ambulatory Visit: Payer: Self-pay | Admitting: Family

## 2023-11-18 DIAGNOSIS — R490 Dysphonia: Secondary | ICD-10-CM

## 2023-11-18 DIAGNOSIS — M1712 Unilateral primary osteoarthritis, left knee: Secondary | ICD-10-CM | POA: Diagnosis not present

## 2023-11-18 NOTE — Telephone Encounter (Signed)
Called pt and left a VM to inform pt referral has been sent, advised pt to call the office back with any further questions or concerns.

## 2023-11-24 DIAGNOSIS — M1712 Unilateral primary osteoarthritis, left knee: Secondary | ICD-10-CM | POA: Diagnosis not present

## 2023-12-05 ENCOUNTER — Other Ambulatory Visit: Payer: Self-pay | Admitting: Family

## 2023-12-16 ENCOUNTER — Inpatient Hospital Stay: Admission: RE | Admit: 2023-12-16 | Payer: BLUE CROSS/BLUE SHIELD | Source: Ambulatory Visit

## 2023-12-22 ENCOUNTER — Institutional Professional Consult (permissible substitution) (INDEPENDENT_AMBULATORY_CARE_PROVIDER_SITE_OTHER): Payer: BLUE CROSS/BLUE SHIELD | Admitting: Otolaryngology

## 2024-01-09 DIAGNOSIS — U071 COVID-19: Secondary | ICD-10-CM | POA: Diagnosis not present

## 2024-01-09 DIAGNOSIS — J209 Acute bronchitis, unspecified: Secondary | ICD-10-CM | POA: Diagnosis not present

## 2024-01-09 DIAGNOSIS — R059 Cough, unspecified: Secondary | ICD-10-CM | POA: Diagnosis not present

## 2024-01-09 DIAGNOSIS — Z20822 Contact with and (suspected) exposure to covid-19: Secondary | ICD-10-CM | POA: Diagnosis not present

## 2024-01-12 ENCOUNTER — Other Ambulatory Visit: Payer: Self-pay | Admitting: Emergency Medicine

## 2024-01-12 DIAGNOSIS — Z122 Encounter for screening for malignant neoplasm of respiratory organs: Secondary | ICD-10-CM

## 2024-01-12 DIAGNOSIS — Z87891 Personal history of nicotine dependence: Secondary | ICD-10-CM

## 2024-01-13 ENCOUNTER — Inpatient Hospital Stay: Admission: RE | Admit: 2024-01-13 | Payer: BLUE CROSS/BLUE SHIELD | Source: Ambulatory Visit

## 2024-02-05 ENCOUNTER — Encounter (INDEPENDENT_AMBULATORY_CARE_PROVIDER_SITE_OTHER): Payer: Self-pay | Admitting: Otolaryngology

## 2024-02-05 ENCOUNTER — Ambulatory Visit (INDEPENDENT_AMBULATORY_CARE_PROVIDER_SITE_OTHER): Payer: Medicare Other | Admitting: Otolaryngology

## 2024-02-05 VITALS — BP 176/77 | HR 88 | Ht 65.0 in | Wt 184.0 lb

## 2024-02-05 DIAGNOSIS — J387 Other diseases of larynx: Secondary | ICD-10-CM

## 2024-02-05 DIAGNOSIS — R0981 Nasal congestion: Secondary | ICD-10-CM

## 2024-02-05 DIAGNOSIS — J3089 Other allergic rhinitis: Secondary | ICD-10-CM

## 2024-02-05 DIAGNOSIS — K219 Gastro-esophageal reflux disease without esophagitis: Secondary | ICD-10-CM

## 2024-02-05 DIAGNOSIS — J383 Other diseases of vocal cords: Secondary | ICD-10-CM

## 2024-02-05 DIAGNOSIS — R0982 Postnasal drip: Secondary | ICD-10-CM | POA: Diagnosis not present

## 2024-02-05 DIAGNOSIS — R490 Dysphonia: Secondary | ICD-10-CM

## 2024-02-05 MED ORDER — FLUTICASONE PROPIONATE 50 MCG/ACT NA SUSP: 2.0000 | Freq: Every day | NASAL | 6 refills | Status: DC

## 2024-02-05 MED ORDER — FAMOTIDINE 20 MG PO TABS: 20.0000 mg | ORAL_TABLET | Freq: Two times a day (BID) | ORAL | 3 refills | Status: DC

## 2024-02-05 MED ORDER — CETIRIZINE HCL 10 MG PO TABS: 10.0000 mg | ORAL_TABLET | Freq: Every day | ORAL | 11 refills | Status: DC

## 2024-02-05 NOTE — Patient Instructions (Signed)

## 2024-02-05 NOTE — Progress Notes (Signed)
 ENT CONSULT:  Reason for Consult: chronic hoarseness  > 1 yr   HPI: Discussed the use of AI scribe software for clinical note transcription with the patient, who gave verbal consent to proceed.  History of Present Illness   Meredith [name] is a 73 year old female who presents with chronic hoarseness for over a year.  She has been experiencing hoarseness describing her voice as 'raspy' with fluctuations, sometimes sounding normal for a few minutes before becoming hoarse again. Symptoms present most days. No pain when talking, complete voice loss, or difficulty swallowing.   She occasionally experiences heartburn, particularly after eating pizza, but is not on any medications for reflux.  Not currently on any treatments for postnasal drainage or allergy symptoms.  Her social history includes being a former smoker, having quit 12 to 13 years ago after smoking a pack a day for 30 years. No dyspnea.      Records Reviewed:  Office visit with PCP Leita Eliza Elbe 10/21/23 1. Chronic hoarseness   2. Benign hypertension   3. Alopecia   4. Controlled type 2 diabetes mellitus without complication, without long-term current use of insulin (HCC)   5. Visit for screening mammogram     Plan:  Referral to ENT; will check TSH and thyroid  ultrasound    Past Medical History:  Diagnosis Date   Anemia    Anxiety state, unspecified    Arthritis    KNEES   B12 deficiency anemia 05/2014 dx   CIN I (cervical intraepithelial neoplasia I)    Depressive disorder, not elsewhere classified    Diabetes mellitus without complication (HCC)    Emphysema of lung (HCC)    Gallstones    Hypertension    Internal hemorrhoids    Other and unspecified hyperlipidemia    Stomach ulcer    Unspecified sleep apnea     Past Surgical History:  Procedure Laterality Date   cataract surg Right    CERVICAL CERCLAGE     CESAREAN SECTION     COLONOSCOPY     HEMORRHOID SURGERY     KNEE ARTHROSCOPY Left 12/2014    UPPER GASTROINTESTINAL ENDOSCOPY      Family History  Problem Relation Age of Onset   Multiple sclerosis Mother    Heart attack Father 72   Diabetes Father    Cancer Father        lung   Heart disease Father     Social History:  reports that she has quit smoking. Her smoking use included cigarettes. She has a 30 pack-year smoking history. She has never used smokeless tobacco. She reports current alcohol use of about 6.0 standard drinks of alcohol per week. She reports that she does not use drugs.  Allergies: No Known Allergies  Medications: I have reviewed the patient's current medications.  The PMH, PSH, Medications, Allergies, and SH were reviewed and updated.  ROS: Constitutional: Negative for fever, weight loss and weight gain. Cardiovascular: Negative for chest pain and dyspnea on exertion. Respiratory: Is not experiencing shortness of breath at rest. Gastrointestinal: Negative for nausea and vomiting. Neurological: Negative for headaches. Psychiatric: The patient is not nervous/anxious  Blood pressure (!) 176/77, pulse 88, height 5' 5 (1.651 m), weight 184 lb (83.5 kg), last menstrual period 12/30/1998, SpO2 96%.  PHYSICAL EXAM:  Exam: General: Well-developed, well-nourished Communication and Voice: Clear pitch and clarity Respiratory Respiratory effort: Equal inspiration and expiration without stridor Cardiovascular Peripheral Vascular: Warm extremities with equal color/perfusion Eyes: No nystagmus with equal  extraocular motion bilaterally Neuro/Psych/Balance: Patient oriented to person, place, and time; Appropriate mood and affect; Gait is intact with no imbalance; Cranial nerves I-XII are intact Head and Face Inspection: Normocephalic and atraumatic without mass or lesion Palpation: Facial skeleton intact without bony stepoffs Salivary Glands: No mass or tenderness Facial Strength: Facial motility symmetric and full bilaterally ENT Pinna: External ear intact  and fully developed External canal: Canal is patent with intact skin Tympanic Membrane: Clear and mobile External Nose: No scar or anatomic deformity Internal Nose: Septum is deviated to the left. No polyp, or purulence. Mucosal edema and erythema present.  Bilateral inferior turbinate hypertrophy.  Lips, Teeth, and gums: Mucosa and teeth intact and viable TMJ: No pain to palpation with full mobility Oral cavity/oropharynx: No erythema or exudate, no lesions present Nasopharynx: No mass or lesion with intact mucosa Hypopharynx: Intact mucosa without pooling of secretions Larynx Glottic: Full true vocal cord mobility without lesion or mass Supraglottic: Normal appearing epiglottis and AE folds Interarytenoid Space: Moderate pachydermia&edema Subglottic Space: Patent without lesion or edema Neck Neck and Trachea: Midline trachea without mass or lesion Thyroid : No mass or nodularity Lymphatics: No lymphadenopathy  Procedure:  Preoperative diagnosis: hoarseness  Postoperative diagnosis:   same + saccular cyst vs laryngeal mass of other etiology GERD LPR  Procedure: Flexible fiberoptic laryngoscopy with stroboscopy (68420)  Surgeon: Elena Larry, MD  Anesthesia: Topical lidocaine  and Afrin  Complications: None  Condition is stable throughout exam  Indications and consent:   The patient presents to the clinic with hoarseness. All the risks, benefits, and potential complications were reviewed with the patient preoperatively and informed verbal consent was obtained.  Procedure: The patient was seated upright in the exam chair.   Topical lidocaine  and Afrin were applied to the nasal cavity. After adequate anesthesia had occurred, the flexible telescope was passed into the nasal cavity. The nasopharynx was patent without mass or lesion. The scope was passed behind the soft palate and directed toward the base of tongue. The base of tongue was visualized and was symmetric with no  apparent masses or abnormal appearing tissue. There were no signs of a mass or pooling of secretions in the piriform sinuses. The supraglottic structures were normal.  The true vocal cords are mobile and slightly atrophic. The medial edges were bowed. Closure was incomplete with supraglottic compression. Periodicity present. The mucosal wave and amplitude were symmetric. There is moderate to severe interarytenoid pachydermia and post cricoid edema. The mucosa appears with a supraglottic cystic lesion at the right anterior aspect of the false fold.   The laryngoscope was then slowly withdrawn and the patient tolerated the procedure well. There were no complications or blood loss.  Studies Reviewed: Thyroid  U/S IMPRESSION: Mildly heterogeneous thyroid  gland.   No significant enlargement, hypervascularity or discrete thyroid  nodule.  TSH normal 10/21/23   Assessment/Plan: Encounter Diagnoses  Name Primary?   Laryngeal mass Yes   Dysphonia    Age-related vocal fold atrophy    Glottic insufficiency    Chronic nasal congestion    Environmental and seasonal allergies    Post-nasal drip    Chronic GERD     Assessment and Plan    Chronic Dysphonia and laryngeal mass/cyst Hoarseness for over 12 months, fluctuating in severity. No associated pain, aphonia. Former smoker, quit 12-13 years ago. Physical exam and scope revealed a lesion above the right true VF, likely a saccular cyst vs neoplasm, and evidence of reflux-related post-cricoid edema, VF atrophy and glottic insufficiency. Discussed risks  and benefits of excision for tissue diagnosis including potential temporary hoarseness post-surgery and need for voice rest. Lesion appears benign but will be confirmed via pathology post-removal. Discussed use of general anesthesia and need for neck scan to assess lesion extent. Will need to have CT neck prior to surgery to fully evaluate the size/extent of the lesion. - Order neck CT with contrast to  evaluate the extent of the false fold mass - Schedule surgery for cyst removal and possible vocal cord filler injection(DML, bronch, CO2 laser excision of right false fold mass, possible bilateral injection augmentation) - Send prescriptions for Zyrtec  and Flonase  to pharmacy  Chronic GERD LPR Evidence of reflux-related changes on videostrobe today. Occasional heartburn and significant burping after meals. No history of burning throat sensation. Discussed role of reflux in symptoms of hoarseness and dysphonia and importance of management for post-surgery healing. - Prescribe Pepcid  BID 20 mg  - trial of reflux gourmet - Educate on reflux management, including dietary modifications - handout given  Chronic Postnasal Drainage and environmental allergies Mild postnasal drainage observed during scope exam. No current treatment. - Send prescriptions for Zyrtec  10 mg daily and Flonase  2 puffs b/l nares BID to the pharmacy  Follow-up - Schedule CT neck  - Schedule surgery at [facility] - Advised voice rest for 2 days post-surgery and gradual resumption of voice use  Thank you for allowing me to participate in the care of this patient. Please do not hesitate to contact me with any questions or concerns.   Elena Larry, MD Otolaryngology [facility] Phone: [phone] Fax: [phone]    02/06/2024, 6:10 AM

## 2024-02-14 ENCOUNTER — Ambulatory Visit (HOSPITAL_COMMUNITY)
Admission: RE | Admit: 2024-02-14 | Discharge: 2024-02-14 | Disposition: A | Discharge status: Home / Self Care | Payer: Medicare Other | Source: Ambulatory Visit | Attending: Otolaryngology | Admitting: Otolaryngology

## 2024-02-14 DIAGNOSIS — I6521 Occlusion and stenosis of right carotid artery: Secondary | ICD-10-CM | POA: Diagnosis not present

## 2024-02-14 DIAGNOSIS — E119 Type 2 diabetes mellitus without complications: Secondary | ICD-10-CM | POA: Diagnosis not present

## 2024-02-14 DIAGNOSIS — J387 Other diseases of larynx: Secondary | ICD-10-CM | POA: Diagnosis not present

## 2024-02-14 DIAGNOSIS — R221 Localized swelling, mass and lump, neck: Secondary | ICD-10-CM | POA: Diagnosis not present

## 2024-02-14 DIAGNOSIS — R490 Dysphonia: Secondary | ICD-10-CM | POA: Diagnosis not present

## 2024-02-14 DIAGNOSIS — I700 Atherosclerosis of aorta: Secondary | ICD-10-CM | POA: Diagnosis not present

## 2024-02-14 LAB — POCT I-STAT CREATININE: Creatinine, Ser: 0.8 mg/dL (ref 0.44–1.00)

## 2024-02-14 MED ORDER — IOHEXOL 300 MG/ML  SOLN
100.0000 mL | Freq: Once | INTRAMUSCULAR | Status: AC | PRN
  Administered 2024-02-14: 100 mL via INTRAVENOUS

## 2024-02-20 ENCOUNTER — Other Ambulatory Visit: Payer: BLUE CROSS/BLUE SHIELD

## 2024-02-25 ENCOUNTER — Telehealth: Payer: Self-pay

## 2024-02-25 NOTE — Telephone Encounter (Signed)
 Pt left message on Plastic Surgery voice mail requesting someone give her a call back to discuss her results. She stated she doesn't understand what she's looking at.   Call back # [phone].

## 2024-02-26 ENCOUNTER — Telehealth: Payer: Self-pay

## 2024-02-26 NOTE — Telephone Encounter (Signed)
 Called but could not leave voicemail it is full to schedule a pre-op and rescope appointment 1-week before her schedule procedure which is 03/19/2024 with Dr. Irene Pap.

## 2024-03-03 ENCOUNTER — Ambulatory Visit
Admission: RE | Admit: 2024-03-03 | Discharge: 2024-03-03 | Disposition: A | Discharge status: Home / Self Care | Payer: BLUE CROSS/BLUE SHIELD | Source: Ambulatory Visit | Attending: Acute Care | Admitting: Acute Care

## 2024-03-03 DIAGNOSIS — Z87891 Personal history of nicotine dependence: Secondary | ICD-10-CM

## 2024-03-03 DIAGNOSIS — Z122 Encounter for screening for malignant neoplasm of respiratory organs: Secondary | ICD-10-CM

## 2024-03-09 ENCOUNTER — Telehealth (INDEPENDENT_AMBULATORY_CARE_PROVIDER_SITE_OTHER): Payer: Self-pay

## 2024-03-09 ENCOUNTER — Telehealth (INDEPENDENT_AMBULATORY_CARE_PROVIDER_SITE_OTHER): Payer: Self-pay | Admitting: Otolaryngology

## 2024-03-09 NOTE — Telephone Encounter (Signed)
 Patient said someone, not sure who called her and said her test was normal and she did not need surgery, so she was cancelling it.  She has not come in for any results.  I left a message to have her contact me to try and clear up any confusion

## 2024-03-09 NOTE — Telephone Encounter (Signed)
 Pre Admission Testing called to inform us that the patient stated her surgery was cancelled by someone in our office after reviewing her scans - but surgery was still scheduled. I called patient to clarify. Patient stated she was given scan results over the phone and told she didn't need surgery. I offered patient appointment to come into the office this week to clear up any confusion. Patient declined and stated she was going to go seek a second opinion. Sx cancelled for 3/21.

## 2024-03-10 NOTE — Progress Notes (Signed)
 Cardiology Office Note    Date:  03/13/2024  ID:  [name], Meredith 15-Sep-1951, MRN [id number] PCP:  Olive Bass, FNP  Cardiologist:  None  Electrophysiologist:  None   Chief Complaint: Precordial pain   History of Present Illness: .    Meredith [name] is a 73 y.o. female with visit-pertinent history of hypertension, type 2 diabetes melitis, sleep apnea.  Patient first evaluated by Dr. Antoine Poche in 05/2016 for shortness of breath.  Patient had normal and low risk MPI at that time.  Echocardiogram at that time showed normal LV function and grade 2 diastolic dysfunction.  Patient was lost to follow-up until 2021, presenting with progressive fatigue.  She underwent repeat MPI that was low risk and normal.  She was last seen in clinic in 10/2021 by Dr. Antoine Poche.  She reported that her shortness of breath had improved since getting iron infusions.  It was recommended that she have 1 year follow-up.  Today she presents for follow-up, she reports that she has overall been doing well.  She notes that she was initially planned to have surgery on her vocal cords however patient her neck CT indicated she did not need surgery.  However her CT scan did show some calcified carotid atherosclerosis, she is following up today regarding this.  She denies any dizziness, lightheadedness or headaches.  She does endorse some occasional chest discomfort with exertion and mild shortness of breath when climbing stairs.  She denies lower extremity edema, orthopnea or PND denies any palpitations.  She tries to swim and walk in pools, notes she does overall tolerate this well.   ROS: .   Today she denies lower extremity edema, fatigue, palpitations, melena, hematuria, hemoptysis, diaphoresis, weakness, presyncope, syncope, orthopnea, and PND.  All other systems are reviewed and otherwise negative. Studies Reviewed: Marland Kitchen    EKG:  EKG is ordered today, personally reviewed, demonstrating  EKG  Interpretation Date/Time:  Thursday March 11 2024 15:17:24 EDT Ventricular Rate:  73 PR Interval:  186 QRS Duration:  80 QT Interval:  394 QTC Calculation: 434 R Axis:   27  Text Interpretation: Normal sinus rhythm Normal ECG When compared with ECG of 02-Jun-2022 20:52, No significant change was found Confirmed by Reather Littler [phone]) on 03/12/2024 4:57:04 PM   CV Studies: Cardiac studies reviewed are outlined and summarized above. Otherwise please see EMR for full report. Cardiac Studies & Procedures   ______________________________________________________________________________________________   STRESS TESTS  MYOCARDIAL PERFUSION IMAGING 06/14/2020  Narrative  Nuclear stress EF: 61%.  The left ventricular ejection fraction is normal (55-65%).  There was no ST segment deviation noted during stress.  The study is normal.  This is a low risk study.  Normal pharmacologic nuclear study with no evidence for prior infarct or ischemia. Normal LVEF.   ECHOCARDIOGRAM  ECHOCARDIOGRAM COMPLETE 05/29/2016  Narrative *[facility]* [address] [place], [place] [address] [phone]  ------------------------------------------------------------------- Transthoracic Echocardiography  Patient:    Meredith, [name] MR #:       [id number] Study Date: 05/29/2016 Gender:     F Age:        53 Height:     166.4 cm Weight:     107.9 kg BSA:        2.28 m^2 Pt. Status: Room:  Dereck Leep, Lillia Corporal PERFORMING   [facility], Outpatient SONOGRAPHER  [facility], RDCS ATTENDING    Nahser, Montez Hageman  cc:  ------------------------------------------------------------------- LV EF: 55% -   60%  ------------------------------------------------------------------- Indications:      Dyspnea (R06.00).  ------------------------------------------------------------------- History:   Risk factors:  Obstructive Sleep Apnea, Asthma, COPD. Family history of  coronary artery disease. Former tobacco use. Hypertension. Diabetes mellitus. Dyslipidemia.  ------------------------------------------------------------------- Study Conclusions  - Left ventricle: The cavity size was normal. Wall thickness was increased in a pattern of mild LVH. Systolic function was normal. The estimated ejection fraction was in the range of 55% to 60%. Wall motion was normal; there were no regional wall motion abnormalities. Features are consistent with a pseudonormal left ventricular filling pattern, with concomitant abnormal relaxation and increased filling pressure (grade 2 diastolic dysfunction). - Left atrium: The atrium was mildly dilated.  Transthoracic echocardiography.  M-mode, complete 2D, spectral Doppler, and color Doppler.  Birthdate:  Patient birthdate: 1951/08/07.  Age:  Patient is 73 yr old.  Sex:  Gender: female. BMI: 39 kg/m^2.  Blood pressure:     140/80  Patient status: Outpatient.  Study date:  Study date: 05/29/2016. Study time: 04:22 PM.  Location:  [facility]  -------------------------------------------------------------------  ------------------------------------------------------------------- Left ventricle:  The cavity size was normal. Wall thickness was increased in a pattern of mild LVH. Systolic function was normal. The estimated ejection fraction was in the range of 55% to 60%. Wall motion was normal; there were no regional wall motion abnormalities. Features are consistent with a pseudonormal left ventricular filling pattern, with concomitant abnormal relaxation and increased filling pressure (grade 2 diastolic dysfunction).  ------------------------------------------------------------------- Aortic valve:   Structurally normal valve.   Cusp separation was normal.  Doppler:  Transvalvular velocity was within the normal range. There was no stenosis. There was no  regurgitation.  ------------------------------------------------------------------- Aorta:  Aortic root: The aortic root was normal in size. Ascending aorta: The ascending aorta was normal in size.  ------------------------------------------------------------------- Mitral valve:   Structurally normal valve.   Leaflet separation was normal.  Doppler:  Transvalvular velocity was within the normal range. There was no evidence for stenosis. There was no regurgitation.    Peak gradient (D): 4 mm Hg.  ------------------------------------------------------------------- Left atrium:  The atrium was mildly dilated.  ------------------------------------------------------------------- Right ventricle:  The cavity size was normal. Systolic function was normal.  ------------------------------------------------------------------- Pulmonic valve:    The valve appears to be grossly normal. Doppler:  There was no significant regurgitation.  ------------------------------------------------------------------- Tricuspid valve:   The valve appears to be grossly normal. Doppler:  There was trivial regurgitation.  ------------------------------------------------------------------- Right atrium:  The atrium was normal in size.  ------------------------------------------------------------------- Pericardium:  There was no pericardial effusion.  ------------------------------------------------------------------- Systemic veins: Inferior vena cava: The vessel was normal in size. The respirophasic diameter changes were in the normal range (= 50%), consistent with normal central venous pressure. Diameter: 12 mm.  ------------------------------------------------------------------- Measurements  IVC                                    Value        Reference ID                                     12    mm     ---------  Left ventricle  Value        Reference LV ID, ED, PLAX  chordal                46.8  mm     43 - 52 LV ID, ES, PLAX chordal                29.4  mm     23 - 38 LV fx shortening, PLAX chordal         37    %      >=29 LV PW thickness, ED                    11.1  mm     --------- IVS/LV PW ratio, ED                    0.92         <=1.3 Stroke volume, 2D                      168   ml     --------- Stroke volume/bsa, 2D                  74    ml/m^2 --------- LV e&', lateral                         11    cm/s   --------- LV E/e&', lateral                       8.95         --------- LV e&', medial                          8.05  cm/s   --------- LV E/e&', medial                        12.24        --------- LV e&', average                         9.53  cm/s   --------- LV E/e&', average                       10.34        ---------  Ventricular septum                     Value        Reference IVS thickness, ED                      10.2  mm     ---------  LVOT                                   Value        Reference LVOT ID, S                             24    mm     --------- LVOT area  4.52  cm^2   --------- LVOT peak velocity, S                  152   cm/s   --------- LVOT mean velocity, S                  106   cm/s   --------- LVOT VTI, S                            37.2  cm     --------- LVOT peak gradient, S                  9     mm Hg  ---------  Aorta                                  Value        Reference Aortic root ID, ED                     29    mm     --------- Ascending aorta ID, A-P, S             36    mm     ---------  Left atrium                            Value        Reference LA ID, A-P, ES                         43    mm     --------- LA ID/bsa, A-P                         1.88  cm/m^2 <=2.2 LA volume, S                           46    ml     --------- LA volume/bsa, S                       20.1  ml/m^2 --------- LA volume, ES, 1-p A4C                 43.8  ml     --------- LA volume/bsa,  ES, 1-p A4C             19.2  ml/m^2 --------- LA volume, ES, 1-p A2C                 49    ml     --------- LA volume/bsa, ES, 1-p A2C             21.5  ml/m^2 ---------  Mitral valve                           Value        Reference Mitral E-wave peak velocity            98.5  cm/s   --------- Mitral A-wave peak velocity            89.7  cm/s   --------- Mitral deceleration time       (  H)     317   ms     150 - 230 Mitral peak gradient, D                4     mm Hg  --------- Mitral E/A ratio, peak                 1.1          ---------  Tricuspid valve                        Value        Reference Tricuspid regurg peak velocity         242   cm/s   --------- Tricuspid peak RV-RA gradient          23    mm Hg  ---------  Right ventricle                        Value        Reference TAPSE                                  27.5  mm     --------- RV s&', lateral, S                      10.8  cm/s   ---------  Legend: (L)  and  (H)  mark values outside specified reference range.  ------------------------------------------------------------------- Prepared and Electronically Authenticated by  Kristeen Miss, M.D. 2017-05-31T17:57:40          ______________________________________________________________________________________________       Current Reported Medications:.    Current Meds  Medication Sig   ALPRAZolam (XANAX) 0.5 MG tablet Take 1 tablet (0.5 mg total) by mouth 2 (two) times daily as needed for anxiety.   amLODipine (NORVASC) 2.5 MG tablet TAKE 1 TABLET BY MOUTH EVERY DAY   aspirin 81 MG tablet Take 81 mg by mouth daily.   escitalopram (LEXAPRO) 20 MG tablet Take 1 tablet (20 mg total) by mouth daily.   furosemide (LASIX) 20 MG tablet Take 1 tablet (20 mg total) by mouth 2 (two) times daily.   irbesartan (AVAPRO) 300 MG tablet TAKE 1 TABLET(300 MG) BY MOUTH DAILY   linagliptin (TRADJENTA) 5 MG TABS tablet Take 1 tablet (5 mg total) by mouth daily.   metFORMIN  (GLUCOPHAGE) 1000 MG tablet Take 1 tablet (1,000 mg total) by mouth 2 (two) times daily with a meal.   metoprolol tartrate (LOPRESSOR) 100 MG tablet Take 1 tablet (100 mg total) by mouth once for 1 dose. 2 hours prior to Scan   rosuvastatin (CRESTOR) 10 MG tablet Take 1 tablet (10 mg total) by mouth daily.   Physical Exam:    VS:  BP 130/70 (BP Location: Left Arm, Patient Position: Sitting, Cuff Size: Normal)   Pulse 73   Ht 5' 4.5" (1.638 m)   Wt 189 lb 12.8 oz (86.1 kg)   LMP 12/30/1998 (Approximate)   SpO2 96%   BMI 32.08 kg/m    Wt Readings from Last 3 Encounters:  03/11/24 189 lb 12.8 oz (86.1 kg)  02/05/24 184 lb (83.5 kg)  10/21/23 197 lb 6.4 oz (89.5 kg)    GEN: Well nourished, well developed in no acute distress NECK: No JVD; soft R carotid bruit CARDIAC: RRR, no murmurs, rubs,  gallops RESPIRATORY:  Clear to auscultation without rales, wheezing or rhonchi  ABDOMEN: Soft, non-tender, non-distended EXTREMITIES:  No edema; No acute deformity     Asessement and Plan:.    Precordial pain: Patient notes some occasional slight chest discomfort with ongoing exertion.  Denies any chest pain at rest.  She also notes some occasional dyspnea with ongoing exertion.  CT chest lung cancer screening on 3/5 indicates three-vessel coronary atherosclerosis and aortic atherosclerosis. She is agreeable to a coronary CTA.  She will take metoprolol prior to procedure.  Reviewed ED precautions.  Continue aspirin 81 mg daily, irbesartan milligrams daily, Lasix 20 mg twice daily, rosuvastatin 10 mg daily.  HTN: Blood pressure today 130/70.  Continue current antihypertensive regimen.  Carotid calcification: Patient with recent CT scan that indicated some carotid atherosclerosis.  Patient has very soft bruit on right side.  Check carotid duplex.  HLD: Last lipid profile on 10/21/2023 indicated total cholesterol 159, HDL 45, triglycerides 253 and LDL 63.  Continue rosuvastatin 10 mg daily.  DM2: Last  hemoglobin A1c 6.4 on 09/09/2023.  Monitored managed per PCP.  OSA: Patient reports nightly CPAP compliance.    Disposition: F/u with Reather Littler, NP in 6-8 weeks.   Signed, Rip Harbour, NP

## 2024-03-11 ENCOUNTER — Encounter: Payer: Self-pay | Admitting: Cardiology

## 2024-03-11 ENCOUNTER — Ambulatory Visit: Payer: Medicare Other | Attending: Cardiology | Admitting: Cardiology

## 2024-03-11 VITALS — BP 130/70 | HR 73 | Ht 64.5 in | Wt 189.8 lb

## 2024-03-11 DIAGNOSIS — I700 Atherosclerosis of aorta: Secondary | ICD-10-CM | POA: Insufficient documentation

## 2024-03-11 DIAGNOSIS — E782 Mixed hyperlipidemia: Secondary | ICD-10-CM | POA: Insufficient documentation

## 2024-03-11 DIAGNOSIS — G4733 Obstructive sleep apnea (adult) (pediatric): Secondary | ICD-10-CM | POA: Diagnosis not present

## 2024-03-11 DIAGNOSIS — I10 Essential (primary) hypertension: Secondary | ICD-10-CM | POA: Diagnosis not present

## 2024-03-11 DIAGNOSIS — R072 Precordial pain: Secondary | ICD-10-CM | POA: Diagnosis not present

## 2024-03-11 DIAGNOSIS — I2510 Atherosclerotic heart disease of native coronary artery without angina pectoris: Secondary | ICD-10-CM | POA: Diagnosis not present

## 2024-03-11 MED ORDER — METOPROLOL TARTRATE 100 MG PO TABS: 100.0000 mg | ORAL_TABLET | Freq: Once | ORAL | 0 refills | Status: DC

## 2024-03-11 NOTE — Patient Instructions (Signed)
 Medication Instructions:  Take 1 tablet of metoprolol tartrate 100 mg 2 hours prior to Ct scan *If you need a refill on your cardiac medications before your next appointment, please call your pharmacy*  Lab Work: Today we are going to draw a Bmet If you have labs (blood work) drawn today and your tests are completely normal, you will receive your results only by: MyChart Message (if you have MyChart) OR A paper copy in the mail If you have any lab test that is abnormal or we need to change your treatment, we will call you to review the results.   Testing/Procedures: Your physician has requested that you have a carotid duplex. This test is an ultrasound of the carotid arteries in your neck. It looks at blood flow through these arteries that supply the brain with blood. Allow one hour for this exam. There are no restrictions or special instructions. This will take place at [address] [facility], [address].  Please note: We ask at that you not bring children with you during ultrasound (echo/ vascular) testing. Due to room size and safety concerns, children are not allowed in the ultrasound rooms during exams. Our front office staff cannot provide observation of children in our lobby area while testing is being conducted. An adult accompanying a patient to their appointment will only be allowed in the ultrasound room at the discretion of the ultrasound technician under special circumstances. We apologize for any inconvenience.    Your cardiac CT will be scheduled at one of the below locations:   [facility] [address] [place], [place] [address] [phone]  If scheduled at [facility], please arrive at the [facility] and Children's Entrance (Entrance C2) of [facility] 30 minutes prior to test start time. You can use the FREE valet parking offered at entrance C (encouraged to control the heart rate for the test)  Proceed to the [facility] Radiology Department  (first floor) to check-in and test prep.    Please follow these instructions carefully (unless otherwise directed):  An IV will be required for this test and Nitroglycerin will be given.    On the Night Before the Test: Be sure to Drink plenty of water. Do not consume any caffeinated/decaffeinated beverages or chocolate 12 hours prior to your test. Do not take any antihistamines 12 hours prior to your test.  On the Day of the Test: Drink plenty of water until 1 hour prior to the test. Do not eat any food 1 hour prior to test. You may take your regular medications prior to the test.  Take metoprolol (Lopressor) two hours prior to test. If you take Furosemide/Hydrochlorothiazide/Spironolactone/Chlorthalidone, please HOLD on the morning of the test. Patients who wear a continuous glucose monitor MUST remove the device prior to scanning. FEMALES- please wear underwire-free bra if available, avoid dresses & tight clothing      After the Test: Drink plenty of water. After receiving IV contrast, you may experience a mild flushed feeling. This is normal. On occasion, you may experience a mild rash up to 24 hours after the test. This is not dangerous. If this occurs, you can take Benadryl 25 mg, Zyrtec, Claritin, or Allegra and increase your fluid intake. (Patients taking Tikosyn should avoid Benadryl, and may take Zyrtec, Claritin, or Allegra) If you experience trouble breathing, this can be serious. If it is severe call 911 IMMEDIATELY. If it is mild, please call our office.  We will call to schedule your  test 2-4 weeks out understanding that some insurance companies will need an authorization prior to the service being performed.   For more information and frequently asked questions, please visit our website : [email or web address]  For non-scheduling related questions, please contact the cardiac imaging nurse navigator should you have any questions/concerns: Cardiac Imaging  Nurse Navigators Direct Office Dial: [phone]   For scheduling needs, including cancellations and rescheduling, please call [place], [phone].   Follow-Up: At [facility], you and your health needs are our priority.  As part of our continuing mission to provide you with exceptional heart care, we have created designated Provider Care Teams.  These Care Teams include your primary Cardiologist (physician) and Advanced Practice Providers (APPs -  Physician Assistants and Nurse Practitioners) who all work together to provide you with the care you need, when you need it.  We recommend signing up for the patient portal called "MyChart".  Sign up information is provided on this After Visit Summary.  MyChart is used to connect with patients for Virtual Visits (Telemedicine).  Patients are able to view lab/test results, encounter notes, upcoming appointments, etc.  Non-urgent messages can be sent to your provider as well.   To learn more about what you can do with MyChart, go to [email or web address].    Your next appointment:   6 week(s)  Provider:   Reather Littler, NP  Other Instructions

## 2024-03-12 LAB — BASIC METABOLIC PANEL
BUN/Creatinine Ratio: 16 (ref 12–28)
BUN: 16 mg/dL (ref 8–27)
CO2: 24 mmol/L (ref 20–29)
Calcium: 9.9 mg/dL (ref 8.7–10.3)
Chloride: 99 mmol/L (ref 96–106)
Creatinine, Ser: 0.99 mg/dL (ref 0.57–1.00)
Glucose: 109 mg/dL — ABNORMAL HIGH (ref 70–99)
Potassium: 4.4 mmol/L (ref 3.5–5.2)
Sodium: 140 mmol/L (ref 134–144)
eGFR: 60 mL/min/{1.73_m2} (ref >59)

## 2024-03-13 ENCOUNTER — Encounter: Payer: Self-pay | Admitting: Cardiology

## 2024-03-15 ENCOUNTER — Other Ambulatory Visit: Payer: Self-pay

## 2024-03-15 DIAGNOSIS — Z122 Encounter for screening for malignant neoplasm of respiratory organs: Secondary | ICD-10-CM

## 2024-03-15 DIAGNOSIS — Z87891 Personal history of nicotine dependence: Secondary | ICD-10-CM

## 2024-03-17 ENCOUNTER — Other Ambulatory Visit: Payer: Self-pay | Admitting: Cardiology

## 2024-03-17 DIAGNOSIS — G4733 Obstructive sleep apnea (adult) (pediatric): Secondary | ICD-10-CM

## 2024-03-17 DIAGNOSIS — E782 Mixed hyperlipidemia: Secondary | ICD-10-CM

## 2024-03-17 DIAGNOSIS — I700 Atherosclerosis of aorta: Secondary | ICD-10-CM

## 2024-03-17 DIAGNOSIS — I10 Essential (primary) hypertension: Secondary | ICD-10-CM

## 2024-03-17 DIAGNOSIS — I6523 Occlusion and stenosis of bilateral carotid arteries: Secondary | ICD-10-CM

## 2024-03-17 DIAGNOSIS — I2510 Atherosclerotic heart disease of native coronary artery without angina pectoris: Secondary | ICD-10-CM

## 2024-03-17 DIAGNOSIS — R072 Precordial pain: Secondary | ICD-10-CM

## 2024-03-19 ENCOUNTER — Ambulatory Visit (HOSPITAL_COMMUNITY): Admit: 2024-03-19 | Payer: BLUE CROSS/BLUE SHIELD

## 2024-03-19 SURGERY — MICROLARYNGOSCOPY WITH CO2 LASER AND EXCISION OF VOCAL CORD LESION
Anesthesia: General | Laterality: Right

## 2024-03-26 ENCOUNTER — Telehealth (INDEPENDENT_AMBULATORY_CARE_PROVIDER_SITE_OTHER): Payer: Self-pay

## 2024-03-26 NOTE — Telephone Encounter (Signed)
 Patient wants to cancel her April follow up, she stated she is getting a second opinion about having surgery

## 2024-03-31 ENCOUNTER — Ambulatory Visit (HOSPITAL_COMMUNITY)
Admission: RE | Admit: 2024-03-31 | Discharge: 2024-03-31 | Disposition: A | Discharge status: Home / Self Care | Source: Ambulatory Visit | Attending: Cardiology | Admitting: Cardiology

## 2024-03-31 DIAGNOSIS — I6523 Occlusion and stenosis of bilateral carotid arteries: Secondary | ICD-10-CM | POA: Insufficient documentation

## 2024-03-31 DIAGNOSIS — I10 Essential (primary) hypertension: Secondary | ICD-10-CM | POA: Diagnosis not present

## 2024-03-31 DIAGNOSIS — R072 Precordial pain: Secondary | ICD-10-CM | POA: Diagnosis not present

## 2024-03-31 DIAGNOSIS — G4733 Obstructive sleep apnea (adult) (pediatric): Secondary | ICD-10-CM | POA: Diagnosis not present

## 2024-03-31 DIAGNOSIS — I2510 Atherosclerotic heart disease of native coronary artery without angina pectoris: Secondary | ICD-10-CM | POA: Diagnosis not present

## 2024-03-31 DIAGNOSIS — I700 Atherosclerosis of aorta: Secondary | ICD-10-CM | POA: Diagnosis not present

## 2024-04-02 ENCOUNTER — Encounter (HOSPITAL_COMMUNITY): Payer: Self-pay

## 2024-04-05 ENCOUNTER — Telehealth (HOSPITAL_COMMUNITY): Payer: Self-pay | Admitting: *Deleted

## 2024-04-05 NOTE — Telephone Encounter (Signed)
 Reaching out to patient to offer assistance regarding upcoming cardiac imaging study; pt verbalizes understanding of appt date/time, parking situation and where to check in, pre-test NPO status and medications ordered, and verified current allergies; name and call back number provided for further questions should they arise Johney Frame RN Navigator Cardiac Imaging [facility] [phone] office [phone] cell

## 2024-04-06 ENCOUNTER — Ambulatory Visit (HOSPITAL_COMMUNITY)
Admission: RE | Admit: 2024-04-06 | Discharge: 2024-04-06 | Disposition: A | Discharge status: Home / Self Care | Source: Ambulatory Visit | Attending: Cardiology | Admitting: Cardiology

## 2024-04-06 DIAGNOSIS — R931 Abnormal findings on diagnostic imaging of heart and coronary circulation: Secondary | ICD-10-CM | POA: Insufficient documentation

## 2024-04-06 DIAGNOSIS — I700 Atherosclerosis of aorta: Secondary | ICD-10-CM | POA: Diagnosis not present

## 2024-04-06 DIAGNOSIS — G4733 Obstructive sleep apnea (adult) (pediatric): Secondary | ICD-10-CM | POA: Diagnosis not present

## 2024-04-06 DIAGNOSIS — I2510 Atherosclerotic heart disease of native coronary artery without angina pectoris: Secondary | ICD-10-CM | POA: Diagnosis not present

## 2024-04-06 DIAGNOSIS — Q211 Atrial septal defect, unspecified: Secondary | ICD-10-CM | POA: Diagnosis not present

## 2024-04-06 DIAGNOSIS — R072 Precordial pain: Secondary | ICD-10-CM | POA: Insufficient documentation

## 2024-04-06 DIAGNOSIS — I10 Essential (primary) hypertension: Secondary | ICD-10-CM | POA: Insufficient documentation

## 2024-04-06 MED ORDER — IOHEXOL 350 MG/ML SOLN
100.0000 mL | Freq: Once | INTRAVENOUS | Status: AC | PRN
  Administered 2024-04-06: 100 mL via INTRAVENOUS

## 2024-04-06 MED ORDER — NITROGLYCERIN 0.4 MG SL SUBL
0.8000 mg | SUBLINGUAL_TABLET | Freq: Once | SUBLINGUAL | Status: AC
  Administered 2024-04-06: 0.8 mg via SUBLINGUAL

## 2024-04-06 MED ORDER — NITROGLYCERIN 0.4 MG SL SUBL
SUBLINGUAL_TABLET | SUBLINGUAL | Status: AC
  Filled 2024-04-06: qty 2

## 2024-04-06 NOTE — Progress Notes (Signed)
Pt verbalized understanding of discharge instruction; opportunity for questions provided ?

## 2024-04-07 ENCOUNTER — Ambulatory Visit (HOSPITAL_COMMUNITY)
Admission: RE | Admit: 2024-04-07 | Discharge: 2024-04-07 | Disposition: A | Discharge status: Home / Self Care | Source: Ambulatory Visit | Attending: Internal Medicine

## 2024-04-07 ENCOUNTER — Other Ambulatory Visit: Payer: Self-pay | Admitting: Internal Medicine

## 2024-04-07 DIAGNOSIS — I10 Essential (primary) hypertension: Secondary | ICD-10-CM | POA: Diagnosis not present

## 2024-04-07 DIAGNOSIS — I700 Atherosclerosis of aorta: Secondary | ICD-10-CM | POA: Diagnosis not present

## 2024-04-07 DIAGNOSIS — R931 Abnormal findings on diagnostic imaging of heart and coronary circulation: Secondary | ICD-10-CM

## 2024-04-07 DIAGNOSIS — I2510 Atherosclerotic heart disease of native coronary artery without angina pectoris: Secondary | ICD-10-CM | POA: Diagnosis not present

## 2024-04-07 DIAGNOSIS — G4733 Obstructive sleep apnea (adult) (pediatric): Secondary | ICD-10-CM | POA: Diagnosis not present

## 2024-04-07 DIAGNOSIS — R072 Precordial pain: Secondary | ICD-10-CM | POA: Diagnosis not present

## 2024-04-08 ENCOUNTER — Encounter (INDEPENDENT_AMBULATORY_CARE_PROVIDER_SITE_OTHER): Payer: BLUE CROSS/BLUE SHIELD | Admitting: Otolaryngology

## 2024-04-12 ENCOUNTER — Telehealth: Payer: Self-pay

## 2024-04-12 DIAGNOSIS — I2510 Atherosclerotic heart disease of native coronary artery without angina pectoris: Secondary | ICD-10-CM

## 2024-04-12 DIAGNOSIS — R0602 Shortness of breath: Secondary | ICD-10-CM

## 2024-04-12 NOTE — Telephone Encounter (Signed)
 Left vague message to call back

## 2024-04-12 NOTE — Telephone Encounter (Signed)
-----   Message from Katlyn D [place] sent at 04/08/2024  1:01 PM EDT ----- Reviewed with patient over the phone, please increase rosuvastatin to 20 mg daily. She also needs an echocardiogram for coronary artery disease and shortness of breath.

## 2024-04-13 MED ORDER — ROSUVASTATIN CALCIUM 20 MG PO TABS: 20.0000 mg | ORAL_TABLET | Freq: Every day | ORAL | 3 refills | Status: AC

## 2024-04-13 NOTE — Telephone Encounter (Signed)
 Called patient confirmed pharmacy and order new medication and echo

## 2024-04-18 ENCOUNTER — Other Ambulatory Visit: Payer: Self-pay | Admitting: Family

## 2024-04-18 NOTE — Progress Notes (Deleted)
 Cardiology Office Note    Date:  04/18/2024  ID:  Mikayla, Chiusano Oct 13, 1951, MRN [id number] PCP:  Adra Alanis, FNP  Cardiologist:  None  Electrophysiologist:  None   Chief Complaint: ***  History of Present Illness: .    BELLA BRUMMET is a 73 y.o. female with visit-pertinent history of hypertension, type 2 diabetes melitis, sleep apnea, nonobstructive CAD on CTA in 2025.  Patient first evaluated by Dr. Lavonne Prairie in 05/2016 for shortness of breath.  Patient had normal and low risk MPI at that time.  Echocardiogram at that time showed normal LV function and grade 2 diastolic dysfunction.  Patient was lost to follow-up until 2021, presenting with progressive fatigue.  She underwent repeat MPI that was low risk and normal.  She was last seen in clinic in 10/2021 by Dr. Lavonne Prairie.  She reported that her shortness of breath had improved since getting iron  infusions.  It was recommended that she have 1 year follow-up.  Patient seen in clinic on 03/11/2024, reported to be doing well overall.  Patient CT scan did show some calcified carotid atherosclerosis.  Patient and endorse some occasional chest discomfort with exertion and mild shortness of breath when climbing stairs.  Coronary CTA was recommended.  Coronary CTA on/08/2024 indicated a coronary calcium  score of 4222, placing the patient in 99th percentile for age and sex matched control.,  RCA with mild mixed plaque proximal and distal RCA, 25 to 49% stenosed, moderate mixed plaque in the mid RCA 50 to 69% stenosis, left main coronary artery with minimal mixed plaque proximal LM less than 25% stenosis, mild mixed plaque proximal and distal LAD, 25 to 49% stenosis, moderate mixed plaque mid LAD, 50 to 69% stenosis, 0 moderate calcified plaque mid LAD 50 to 69%, mild diffuse plaque throughout LCx 25 to 49% stenosis.  Total plaque volume 2355 mm which is 100 percentile for age and sex matched control, T PV is extensive.  CT FFR analysis showed no  hemodynamically significant stenosis.  Echocardiogram recommended for further evaluation.  Today she presents for follow-up.  She reports that she  Nonobstructive CAD: Coronary CTA on/08/2024 indicated a coronary calcium  score of 4222, placing the patient in 99th percentile for age and sex matched control.,  RCA with mild mixed plaque proximal and distal RCA, 25 to 49% stenosed, moderate mixed plaque in the mid RCA 50 to 69% stenosis, left main coronary artery with minimal mixed plaque proximal LM less than 25% stenosis, mild mixed plaque proximal and distal LAD, 25 to 49% stenosis, moderate mixed plaque mid LAD, 50 to 69% stenosis, 0 moderate calcified plaque mid LAD 50 to 69%, mild diffuse plaque throughout LCx 25 to 49% stenosis.  Total plaque volume 2355 mm which is 100 percentile for age and sex matched control, T PV is extensive.  CT FFR analysis showed no hemodynamically significant stenosis.  Echocardiogram recommended for further evaluation. Today she reports  Heart healthy diet and regular cardiovascular exercise encouraged.   Reviewed ED precautions. Continue  HTN: Blood pressure today  Carotid calcification: Right and left ICA consistent with 1 to 39% stenosis.  Continue rosuvastatin  and aspirin. HLD: Last lipid profile on 10/21/2023 indicated total cholesterol 159, HDL 45, triglycerides 253 and LDL 63.  Continue rosuvastatin  10 mg daily. DM2: Last hemoglobin A1c 6.4 on 09/09/2023 OSA:   Labwork independently reviewed:   ROS: .   *** denies chest pain, shortness of breath, lower extremity edema, fatigue, palpitations, melena, hematuria, hemoptysis, diaphoresis, weakness,  presyncope, syncope, orthopnea, and PND.  All other systems are reviewed and otherwise negative.  Studies Reviewed: Aaron Aas    EKG:  EKG is ordered today, personally reviewed, demonstrating ***     CV Studies: Cardiac studies reviewed are outlined and summarized above. Otherwise please see EMR for full report. Cardiac  Studies & Procedures   ______________________________________________________________________________________________   STRESS TESTS  MYOCARDIAL PERFUSION IMAGING 06/14/2020  Narrative  Nuclear stress EF: 61%.  The left ventricular ejection fraction is normal (55-65%).  There was no ST segment deviation noted during stress.  The study is normal.  This is a low risk study.  Normal pharmacologic nuclear study with no evidence for prior infarct or ischemia. Normal LVEF.   ECHOCARDIOGRAM  ECHOCARDIOGRAM COMPLETE 05/29/2016  Narrative *[facility]* [address] [place], [place] [address] [phone]  ------------------------------------------------------------------- Transthoracic Echocardiography  Patient:    Kippy, Melena MR #:       [id number] Study Date: 05/29/2016 Gender:     F Age:        84 Height:     166.4 cm Weight:     107.9 kg BSA:        2.28 m^2 Pt. Status: Room:  Stevenson Elbe, Augustina Block PERFORMING   [facility], Outpatient SONOGRAPHER  [facility], RDCS ATTENDING    Nahser, Marieta Shorten  cc:  ------------------------------------------------------------------- LV EF: 55% -   60%  ------------------------------------------------------------------- Indications:      Dyspnea (R06.00).  ------------------------------------------------------------------- History:   Risk factors:  Obstructive Sleep Apnea, Asthma, COPD. Family history of coronary artery disease. Former tobacco use. Hypertension. Diabetes mellitus. Dyslipidemia.  ------------------------------------------------------------------- Study Conclusions  - Left ventricle: The cavity size was normal. Wall thickness was increased in a pattern of mild LVH. Systolic function was normal. The estimated ejection fraction was in the range of 55% to 60%. Wall motion was normal; there were no regional wall motion abnormalities. Features are consistent with a  pseudonormal left ventricular filling pattern, with concomitant abnormal relaxation and increased filling pressure (grade 2 diastolic dysfunction). - Left atrium: The atrium was mildly dilated.  Transthoracic echocardiography.  M-mode, complete 2D, spectral Doppler, and color Doppler.  Birthdate:  Patient birthdate: 1951-08-06.  Age:  Patient is 73 yr old.  Sex:  Gender: female. BMI: 39 kg/m^2.  Blood pressure:     140/80  Patient status: Outpatient.  Study date:  Study date: 05/29/2016. Study time: 04:22 PM.  Location:  [facility]  -------------------------------------------------------------------  ------------------------------------------------------------------- Left ventricle:  The cavity size was normal. Wall thickness was increased in a pattern of mild LVH. Systolic function was normal. The estimated ejection fraction was in the range of 55% to 60%. Wall motion was normal; there were no regional wall motion abnormalities. Features are consistent with a pseudonormal left ventricular filling pattern, with concomitant abnormal relaxation and increased filling pressure (grade 2 diastolic dysfunction).  ------------------------------------------------------------------- Aortic valve:   Structurally normal valve.   Cusp separation was normal.  Doppler:  Transvalvular velocity was within the normal range. There was no stenosis. There was no regurgitation.  ------------------------------------------------------------------- Aorta:  Aortic root: The aortic root was normal in size. Ascending aorta: The ascending aorta was normal in size.  ------------------------------------------------------------------- Mitral valve:   Structurally normal valve.   Leaflet separation was normal.  Doppler:  Transvalvular velocity was within the normal range. There was no evidence for stenosis. There was no regurgitation.    Peak gradient (D):  4 mm  Hg.  ------------------------------------------------------------------- Left atrium:  The atrium was mildly dilated.  ------------------------------------------------------------------- Right ventricle:  The cavity size was normal. Systolic function was normal.  ------------------------------------------------------------------- Pulmonic valve:    The valve appears to be grossly normal. Doppler:  There was no significant regurgitation.  ------------------------------------------------------------------- Tricuspid valve:   The valve appears to be grossly normal. Doppler:  There was trivial regurgitation.  ------------------------------------------------------------------- Right atrium:  The atrium was normal in size.  ------------------------------------------------------------------- Pericardium:  There was no pericardial effusion.  ------------------------------------------------------------------- Systemic veins: Inferior vena cava: The vessel was normal in size. The respirophasic diameter changes were in the normal range (= 50%), consistent with normal central venous pressure. Diameter: 12 mm.  ------------------------------------------------------------------- Measurements  IVC                                    Value        Reference ID                                     12    mm     ---------  Left ventricle                         Value        Reference LV ID, ED, PLAX chordal                46.8  mm     43 - 52 LV ID, ES, PLAX chordal                29.4  mm     23 - 38 LV fx shortening, PLAX chordal         37    %      >=29 LV PW thickness, ED                    11.1  mm     --------- IVS/LV PW ratio, ED                    0.92         <=1.3 Stroke volume, 2D                      168   ml     --------- Stroke volume/bsa, 2D                  74    ml/m^2 --------- LV e&', lateral                         11    cm/s   --------- LV E/e&', lateral                        8.95         --------- LV e&', medial                          8.05  cm/s   --------- LV E/e&', medial                        12.24        ---------  LV e&', average                         9.53  cm/s   --------- LV E/e&', average                       10.34        ---------  Ventricular septum                     Value        Reference IVS thickness, ED                      10.2  mm     ---------  LVOT                                   Value        Reference LVOT ID, S                             24    mm     --------- LVOT area                              4.52  cm^2   --------- LVOT peak velocity, S                  152   cm/s   --------- LVOT mean velocity, S                  106   cm/s   --------- LVOT VTI, S                            37.2  cm     --------- LVOT peak gradient, S                  9     mm Hg  ---------  Aorta                                  Value        Reference Aortic root ID, ED                     29    mm     --------- Ascending aorta ID, A-P, S             36    mm     ---------  Left atrium                            Value        Reference LA ID, A-P, ES                         43    mm     --------- LA ID/bsa, A-P                         1.88  cm/m^2 <=2.2 LA volume, S  46    ml     --------- LA volume/bsa, S                       20.1  ml/m^2 --------- LA volume, ES, 1-p A4C                 43.8  ml     --------- LA volume/bsa, ES, 1-p A4C             19.2  ml/m^2 --------- LA volume, ES, 1-p A2C                 49    ml     --------- LA volume/bsa, ES, 1-p A2C             21.5  ml/m^2 ---------  Mitral valve                           Value        Reference Mitral E-wave peak velocity            98.5  cm/s   --------- Mitral A-wave peak velocity            89.7  cm/s   --------- Mitral deceleration time       (H)     317   ms     150 - 230 Mitral peak gradient, D                4     mm Hg  --------- Mitral E/A ratio, peak                  1.1          ---------  Tricuspid valve                        Value        Reference Tricuspid regurg peak velocity         242   cm/s   --------- Tricuspid peak RV-RA gradient          23    mm Hg  ---------  Right ventricle                        Value        Reference TAPSE                                  27.5  mm     --------- RV s&', lateral, S                      10.8  cm/s   ---------  Legend: (L)  and  (H)  mark values outside specified reference range.  ------------------------------------------------------------------- Prepared and Electronically Authenticated by  Ahmad Alert, M.D. 2017-05-31T17:57:40      CT SCANS  CT CORONARY MORPH W/CTA COR W/SCORE 04/06/2024  Addendum 04/10/2024  3:29 AM ADDENDUM REPORT: 04/10/2024 03:26  EXAM: OVER-READ INTERPRETATION  CT CHEST  The following report is an over-read performed by radiologist Dr. Virgle Grime of [facility] on 04/10/2024. This over-read does not include interpretation of cardiac or coronary anatomy or pathology. The coronary calcium  score/coronary CTA interpretation by the cardiologist is attached.  COMPARISON:  March 03, 2024 and December 12, 2021  FINDINGS: Cardiovascular: There are no significant extracardiac vascular findings.  Mediastinum/Nodes: There are no enlarged lymph nodes within the visualized mediastinum.  Lungs/Pleura: There is no pleural effusion. Stable, likely benign 3 mm anterior right middle lobe parenchymal and pleural based pulmonary nodules are noted (axial CT image 14 and 17, CT series 12).  A stable, likely benign 2 mm posterior lateral right middle lobe pulmonary nodule is also noted (axial CT image 22, CT series 12).  There is a stable, likely benign 3 mm pleural based anterolateral right lower lobe pulmonary nodule (axial CT image 28, CT series 12).  Upper abdomen: No significant findings in the visualized  upper abdomen.  Musculoskeletal/Chest wall: No chest wall mass or suspicious osseous findings within the visualized chest.  IMPRESSION: Stable, likely benign right middle and right lower lobe pulmonary nodules.   Electronically Signed By: Virgle Grime M.D. On: 04/10/2024 03:26  Narrative HISTORY: Coronary stenosis  EXAM: Cardiac/Coronary  CT  TECHNIQUE: The patient was scanned on a [employer].  PROTOCOL: A 120 kV prospective scan was triggered in the descending thoracic aorta at 111 HU's. Axial non-contrast 3 mm slices were carried out through the heart. The data set was analyzed on a dedicated work station and scored using the Agatston method. Gantry rotation speed was 250 msecs and collimation was .6 mm. Beta blockade and 0.8 mg of sl NTG was given. The 3D data set was reconstructed in 5% intervals of the 35-75 % of the R-R cycle. Systolic and diastolic phases were analyzed on a dedicated work station using MPR, MIP and VRT modes. The patient received contrast: 100mL OMNIPAQUE  IOHEXOL  350 MG/ML SOLN.  FINDINGS: Image quality: Good  Noise artifact is: Limited  Coronary calcium  score is 4222, which places the patient in the 99th percentile for age and sex matched control.  Coronary arteries: Normal coronary origins.  Right dominance.  Right Coronary Artery: Mild mixed plaque proximal and distal RCA, 25-49% stenosis. Moderate mixed plaque in the mid RCA, 50-69% stenosis.  Left Main Coronary Artery: Minimal mixed plaque proximal LM, <25% stenosis.  Left Anterior Descending Coronary Artery: Mild mixed plaque proximal and distal LAD, 25-49% stenosis. Moderate mixed plaque mid LAD, 50-69% stenosis, with serial moderate calcified plaque mid LAD 50-69%.  Artery: Mild diffuse plaque throughout LCx, 25-49% stenosis.  Aorta: Normal size, 33 mm at the mid ascending aorta (level of the PA bifurcation) measured double oblique.  Aortic Valve: No  calcifications.  Other findings:  Normal variant pulmonary vein drainage into the left atrium (3 right, 2 left with common antrum).  Normal left atrial appendage without thrombus.  Normal size of the pulmonary artery.  Please see separate report from [facility] for non-cardiac findings.  IMPRESSION: 1. Moderate CAD in the mid LAD, 50-69% stenosis, CADRADS 3. CT FFR will be performed and reported separately.  2. Total plaque volume 2355 mm3 which is 100th percentile for age- and sex-matched controls (calcified plaque 811 mm3; non-calcified plaque 1544 mm3). TPV is extensive.  3. Coronary calcium  score is 4222, which places the patient in the 99th percentile for age and sex matched control.  4. Normal coronary origins with right dominance.  RECOMMENDATIONS: CAD-RADS 3. Moderate stenosis. Consider symptom-guided anti-ischemic pharmacotherapy as well as risk factor modification per guideline directed care. Additional analysis with CT FFR will be submitted.  Electronically Signed: By: Grady Lawman M.D. On: 04/07/2024 10:19     ______________________________________________________________________________________________       Current Reported Medications:.    No  outpatient medications have been marked as taking for the 04/19/24 encounter (Appointment) with Trooper Olander D, NP.    Physical Exam:    VS:  LMP 12/30/1998 (Approximate)    Wt Readings from Last 3 Encounters:  03/11/24 189 lb 12.8 oz (86.1 kg)  02/05/24 184 lb (83.5 kg)  10/21/23 197 lb 6.4 oz (89.5 kg)    GEN: Well nourished, well developed in no acute distress NECK: No JVD; No carotid bruits CARDIAC: ***RRR, no murmurs, rubs, gallops RESPIRATORY:  Clear to auscultation without rales, wheezing or rhonchi  ABDOMEN: Soft, non-tender, non-distended EXTREMITIES:  No edema; No acute deformity     Asessement and Plan:.     ***     Disposition: F/u with ***  Signed, Mera Gunkel D Kayan Blissett, NP

## 2024-04-19 ENCOUNTER — Ambulatory Visit: Admitting: Cardiology

## 2024-04-19 DIAGNOSIS — I10 Essential (primary) hypertension: Secondary | ICD-10-CM

## 2024-04-19 DIAGNOSIS — I2510 Atherosclerotic heart disease of native coronary artery without angina pectoris: Secondary | ICD-10-CM

## 2024-04-19 DIAGNOSIS — I700 Atherosclerosis of aorta: Secondary | ICD-10-CM

## 2024-04-19 DIAGNOSIS — G4733 Obstructive sleep apnea (adult) (pediatric): Secondary | ICD-10-CM

## 2024-04-19 DIAGNOSIS — E782 Mixed hyperlipidemia: Secondary | ICD-10-CM

## 2024-04-27 ENCOUNTER — Ambulatory Visit (HOSPITAL_COMMUNITY): Attending: Internal Medicine

## 2024-04-27 DIAGNOSIS — I2510 Atherosclerotic heart disease of native coronary artery without angina pectoris: Secondary | ICD-10-CM

## 2024-04-27 DIAGNOSIS — R0602 Shortness of breath: Secondary | ICD-10-CM | POA: Diagnosis not present

## 2024-04-29 LAB — ECHOCARDIOGRAM COMPLETE
Area-P 1/2: 2.9 cm2
S' Lateral: 2.6 cm

## 2024-04-30 ENCOUNTER — Telehealth: Payer: Self-pay

## 2024-04-30 NOTE — Telephone Encounter (Signed)
 Called patient advised of below they verbalized understanding.

## 2024-04-30 NOTE — Telephone Encounter (Signed)
-----   Message from Meredith [name] sent at 04/30/2024  7:19 AM EDT ----- Please let Meredith [name] know that her echocardiogram showed normal heart squeeze and function. There was some mild stiffening of the left lower chamber of the heart, this is common with aging and history of hypertension. There is some mild calcification of the aortic valve without any evidence of valve narrowing or leaking. Overall good results, continue current medications and follow up with Meredith [name] as scheduled.

## 2024-05-12 DIAGNOSIS — Z87891 Personal history of nicotine dependence: Secondary | ICD-10-CM | POA: Diagnosis not present

## 2024-05-12 DIAGNOSIS — R490 Dysphonia: Secondary | ICD-10-CM | POA: Diagnosis not present

## 2024-05-12 DIAGNOSIS — J387 Other diseases of larynx: Secondary | ICD-10-CM | POA: Diagnosis not present

## 2024-06-03 ENCOUNTER — Other Ambulatory Visit: Payer: Self-pay

## 2024-06-03 ENCOUNTER — Inpatient Hospital Stay: Payer: BLUE CROSS/BLUE SHIELD | Attending: Hematology and Oncology

## 2024-06-03 DIAGNOSIS — D509 Iron deficiency anemia, unspecified: Secondary | ICD-10-CM | POA: Diagnosis present

## 2024-06-03 DIAGNOSIS — R5383 Other fatigue: Secondary | ICD-10-CM | POA: Insufficient documentation

## 2024-06-03 DIAGNOSIS — E119 Type 2 diabetes mellitus without complications: Secondary | ICD-10-CM | POA: Diagnosis not present

## 2024-06-03 DIAGNOSIS — D519 Vitamin B12 deficiency anemia, unspecified: Secondary | ICD-10-CM

## 2024-06-03 DIAGNOSIS — E538 Deficiency of other specified B group vitamins: Secondary | ICD-10-CM | POA: Insufficient documentation

## 2024-06-03 DIAGNOSIS — G8929 Other chronic pain: Secondary | ICD-10-CM | POA: Insufficient documentation

## 2024-06-03 LAB — CBC WITH DIFFERENTIAL (CANCER CENTER ONLY)
Abs Immature Granulocytes: 0.01 10*3/uL (ref 0.00–0.07)
Basophils Absolute: 0.1 10*3/uL (ref 0.0–0.1)
Basophils Relative: 1 %
Eosinophils Absolute: 0.2 10*3/uL (ref 0.0–0.5)
Eosinophils Relative: 4 %
HCT: 33.9 % — ABNORMAL LOW (ref 36.0–46.0)
Hemoglobin: 11 g/dL — ABNORMAL LOW (ref 12.0–15.0)
Immature Granulocytes: 0 %
Lymphocytes Relative: 24 %
Lymphs Abs: 1.2 10*3/uL (ref 0.7–4.0)
MCH: 29.2 pg (ref 26.0–34.0)
MCHC: 32.4 g/dL (ref 30.0–36.0)
MCV: 89.9 fL (ref 80.0–100.0)
Monocytes Absolute: 0.3 10*3/uL (ref 0.1–1.0)
Monocytes Relative: 7 %
Neutro Abs: 3.2 10*3/uL (ref 1.7–7.7)
Neutrophils Relative %: 64 %
Platelet Count: 224 10*3/uL (ref 150–400)
RBC: 3.77 MIL/uL — ABNORMAL LOW (ref 3.87–5.11)
RDW: 12.8 % (ref 11.5–15.5)
WBC Count: 5 10*3/uL (ref 4.0–10.5)
nRBC: 0 % (ref 0.0–0.2)

## 2024-06-03 LAB — IRON AND IRON BINDING CAPACITY (CC-WL,HP ONLY)
Iron: 38 ug/dL (ref 28–170)
Saturation Ratios: 10 % — ABNORMAL LOW (ref 10.4–31.8)
TIBC: 379 ug/dL (ref 250–450)
UIBC: 341 ug/dL (ref 148–442)

## 2024-06-03 LAB — VITAMIN B12: Vitamin B-12: 329 pg/mL (ref 180–914)

## 2024-06-03 LAB — FERRITIN: Ferritin: 39 ng/mL (ref 11–307)

## 2024-06-03 LAB — RETICULOCYTES
Immature Retic Fract: 10.3 % (ref 2.3–15.9)
RBC.: 3.71 MIL/uL — ABNORMAL LOW (ref 3.87–5.11)
Retic Count, Absolute: 39 10*3/uL (ref 19.0–186.0)
Retic Ct Pct: 1.1 % (ref 0.4–3.1)

## 2024-06-03 LAB — HEMOGLOBIN A1C
Hgb A1c MFr Bld: 5.8 % — ABNORMAL HIGH (ref 4.8–5.6)
Mean Plasma Glucose: 119.76 mg/dL

## 2024-06-03 LAB — SEDIMENTATION RATE: Sed Rate: 55 mm/h — ABNORMAL HIGH (ref 0–22)

## 2024-06-03 NOTE — Progress Notes (Unsigned)
  Cardiology Office Note:   Date:  06/04/2024  ID:  Yasmine, Kilbourne [birth year], MRN [id number] PCP: Adra Alanis, FNP  [facility] Providers Cardiologist:  None {  History of Present Illness:   Meredith [name] is a 73 y.o. female who is referred by Adra Alanis, FNP for evaluation of dyspnea and fatigue. In 2021 she had DOE and HTN.  She had a negative Lexiscan  Myoview .  POET (Plain Old Exercise Treadmill) was inconclusive.  Nuclear stress test was negative.    Coronary CTA demonstrated non obstructive plaque.  In April 2025 she had mixed plaque with distal LAD 25 to 49% stenosis.  There was moderate mixed plaque in the mid LAD of 50 to 69% stenosis.  Circumflex had 25 to 49% stenosis throughout.  The right coronary artery had 25 to 49% stenosis proximally and distally and mid 50 to 69% stenosis.  She had significantly elevated coronary calcium .  Her total plaque volume was increased.  FFR did not suggest any of these to be hemodynamically significant.  Since she was last seen she has done well.  She does not exercise as much because she has some joint pains.  However, with her regular activity she denies any cardiovascular symptoms. The patient denies any new symptoms such as chest discomfort, neck or arm discomfort. There has been no new shortness of breath, PND or orthopnea. There have been no reported palpitations, presyncope or syncope.   ROS: As stated in the HPI and negative for all other systems.  Studies Reviewed:    EKG:   NA  Risk Assessment/Calculations:      Physical Exam:   VS:  BP (!) 168/74 (BP Location: Right Arm, Patient Position: Sitting, Cuff Size: Normal)   Pulse 75   Ht 5\' 4"  (1.626 m)   Wt 188 lb 9.6 oz (85.5 kg)   LMP 12/30/1998 (Approximate)   SpO2 94%   BMI 32.37 kg/m    Wt Readings from Last 3 Encounters:  06/04/24 188 lb 9.6 oz (85.5 kg)  03/11/24 189 lb 12.8 oz (86.1 kg)  02/05/24 184 lb (83.5 kg)     GEN: Well nourished, well  developed in no acute distress NECK: No JVD; No carotid bruits CARDIAC: RRR, no murmurs, rubs, gallops RESPIRATORY:  Clear to auscultation without rales, wheezing or rhonchi  ABDOMEN: Soft, non-tender, non-distended EXTREMITIES:  No edema; No deformity   ASSESSMENT AND PLAN:    CAD: She has significant nonobstructive plaque and we are going to pursue aggressive risk reduction.  We talked about symptoms that would prompt her to get further evaluation.   HTN: Her blood pressure is not at target.  She is going to keep blood pressure diary.  She will send that to us .  She will likely need an increased dose of her amlodipine .    DM: A1c is 5.8 yesterday this is improved.  Plans per her primary provider.   SLEEP APNEA:   She uses CPAP.  DYSLIPIDEMIA: We had a long conversation about diet and exercise.  She is going to get an NMR and LP(a) and further adjustments will be based on this.  The goal will be an LDL in the 50s.  Follow up with both Katlyn West NP in about 4 months.   Signed, Eilleen Grates, MD

## 2024-06-04 ENCOUNTER — Ambulatory Visit: Attending: Cardiology | Admitting: Cardiology

## 2024-06-04 ENCOUNTER — Encounter: Payer: Self-pay | Admitting: Cardiology

## 2024-06-04 VITALS — BP 168/74 | HR 75 | Ht 64.0 in | Wt 188.6 lb

## 2024-06-04 DIAGNOSIS — E118 Type 2 diabetes mellitus with unspecified complications: Secondary | ICD-10-CM | POA: Insufficient documentation

## 2024-06-04 DIAGNOSIS — R5383 Other fatigue: Secondary | ICD-10-CM | POA: Diagnosis not present

## 2024-06-04 DIAGNOSIS — E782 Mixed hyperlipidemia: Secondary | ICD-10-CM | POA: Diagnosis not present

## 2024-06-04 DIAGNOSIS — I2510 Atherosclerotic heart disease of native coronary artery without angina pectoris: Secondary | ICD-10-CM | POA: Insufficient documentation

## 2024-06-04 DIAGNOSIS — I10 Essential (primary) hypertension: Secondary | ICD-10-CM | POA: Insufficient documentation

## 2024-06-04 DIAGNOSIS — R0602 Shortness of breath: Secondary | ICD-10-CM | POA: Insufficient documentation

## 2024-06-04 NOTE — Patient Instructions (Signed)
 Medication Instructions:  Your physician recommends that you continue on your current medications as directed. Please refer to the Current Medication list given to you today.  *If you need a refill on your cardiac medications before your next appointment, please call your pharmacy*  Lab Work: NMR, Lpa today If you have labs (blood work) drawn today and your tests are completely normal, you will receive your results only by: MyChart Message (if you have MyChart) OR A paper copy in the mail If you have any lab test that is abnormal or we need to change your treatment, we will call you to review the results.  Testing/Procedures: NONE  Follow-Up: At [facility], you and your health needs are our priority.  As part of our continuing mission to provide you with exceptional heart care, our providers are all part of one team.  This team includes your primary Cardiologist (physician) and Advanced Practice Providers or APPs (Physician Assistants and Nurse Practitioners) who all work together to provide you with the care you need, when you need it.  Your next appointment:   4 months  Provider:   Katlyn West, NP  We recommend signing up for the patient portal called "MyChart".  Sign up information is provided on this After Visit Summary.  MyChart is used to connect with patients for Virtual Visits (Telemedicine).  Patients are able to view lab/test results, encounter notes, upcoming appointments, etc.  Non-urgent messages can be sent to your provider as well.   To learn more about what you can do with MyChart, go to [email or web address].   Other Instructions Blood pressure diary: Take you blood pressure 3 times daily for 10 days and send us  the readings on MyChart

## 2024-06-05 ENCOUNTER — Ambulatory Visit: Payer: Self-pay | Admitting: Cardiology

## 2024-06-05 LAB — NMR, LIPOPROFILE
Cholesterol, Total: 155 mg/dL (ref 100–199)
HDL Particle Number: 40.6 umol/L (ref >=30.5)
HDL-C: 61 mg/dL (ref >39)
LDL Particle Number: 845 nmol/L (ref <1000)
LDL Size: 20.3 nm — ABNORMAL LOW (ref >20.5)
LDL-C (NIH Calc): 80 mg/dL (ref 0–99)
LP-IR Score: 35 (ref <=45)
Small LDL Particle Number: 531 nmol/L — ABNORMAL HIGH (ref <=527)
Triglycerides: 75 mg/dL (ref 0–149)

## 2024-06-05 LAB — LIPOPROTEIN A (LPA): Lipoprotein (a): 16.1 nmol/L (ref <75.0)

## 2024-06-10 ENCOUNTER — Inpatient Hospital Stay: Payer: BLUE CROSS/BLUE SHIELD | Admitting: Hematology and Oncology

## 2024-06-10 ENCOUNTER — Telehealth: Payer: Self-pay

## 2024-06-10 ENCOUNTER — Encounter: Payer: Self-pay | Admitting: Hematology and Oncology

## 2024-06-10 VITALS — BP 112/61 | HR 69 | Resp 18 | Ht 64.0 in | Wt 187.2 lb

## 2024-06-10 DIAGNOSIS — R5383 Other fatigue: Secondary | ICD-10-CM | POA: Diagnosis not present

## 2024-06-10 DIAGNOSIS — D519 Vitamin B12 deficiency anemia, unspecified: Secondary | ICD-10-CM | POA: Diagnosis not present

## 2024-06-10 DIAGNOSIS — E538 Deficiency of other specified B group vitamins: Secondary | ICD-10-CM | POA: Diagnosis not present

## 2024-06-10 DIAGNOSIS — D509 Iron deficiency anemia, unspecified: Secondary | ICD-10-CM | POA: Diagnosis not present

## 2024-06-10 DIAGNOSIS — E119 Type 2 diabetes mellitus without complications: Secondary | ICD-10-CM | POA: Diagnosis not present

## 2024-06-10 DIAGNOSIS — G8929 Other chronic pain: Secondary | ICD-10-CM | POA: Diagnosis not present

## 2024-06-10 NOTE — Assessment & Plan Note (Addendum)
 She has remote history of vitamin B12 deficiency Repeat B12 level is adequate Will recheck it again in the future

## 2024-06-10 NOTE — Progress Notes (Signed)
   [facility] OFFICE PROGRESS NOTE  Adra Alanis, FNP  ASSESSMENT & PLAN:  Assessment & Plan Iron  deficiency anemia, unspecified iron  deficiency anemia type The patient has chronic remittent iron  deficiency anemia and has been receiving intermittent IV iron  for the last 2 years Her last intravenous iron  was in September 2023 Currently, she is symptomatic We reviewed test results I recommend 2 doses of intravenous iron  Feraheme I will see her again in a year for further follow-up Anemia due to vitamin B12 deficiency, unspecified B12 deficiency type She has remote history of vitamin B12 deficiency Repeat B12 level is adequate Will recheck it again in the future    Orders Placed This Encounter  Procedures   Vitamin B12    Standing Status:   Future    Expiration Date:   06/10/2025   Ferritin    Standing Status:   Future    Expiration Date:   06/10/2025   Iron  and Iron  Binding Capacity ([facility] only)    Standing Status:   Future    Expiration Date:   06/10/2025   CBC with Differential (Cancer Center Only)    Standing Status:   Future    Expiration Date:   06/10/2025   Sedimentation rate    Standing Status:   Future    Expiration Date:   06/10/2025    INTERVAL HISTORY: Patient returns for recurrent anemia Symptoms of anemia includes fatigue We reviewed CBC, iron  studies and vitamin B12 level  SUMMARY OF HEMATOLOGIC HISTORY:  She was found to have abnormal CBC from recent blood work I have the opportunity to review her CBC dated back to June 2015 Hemoglobin ranged from 10.3-11.9 Her most recent blood work show evidence of severe iron  deficiency She had EGD and colonoscopy performed in 2015 which showed no evidence of GI bleed She denies recent chest pain on exertion, but does complain of shortness of breath on minimal exertion and excessive fatigue. She had not noticed any recent bleeding such as epistaxis but did states she has occasional  hemorrhoidal bleeding once a month She had taken NSAID in the past for chronic joint pain but nothing recently.  She is on chronic aspirin therapy  She had history of abnormal PAP smear but no prior history of cancer. Her age appropriate screening programs are up-to-date. She eats a variety of diet She used to donate blood a lot in the past but nothing in the last 20 years She has pica and craves ice The patient was prescribed oral iron  supplements and she takes in the past but that cause significant constipation Between December 2021 to 2023 she received multiple doses of IV iron ; she received her final dose of IV iron  in September 2023   Vitals:   06/10/24 1004  BP: 112/61  Pulse: 69  Resp: 18  SpO2: 99%

## 2024-06-10 NOTE — Assessment & Plan Note (Addendum)
 The patient has chronic remittent iron  deficiency anemia and has been receiving intermittent IV iron  for the last 2 years Her last intravenous iron  was in September 2023 Currently, she is symptomatic We reviewed test results I recommend 2 doses of intravenous iron  Feraheme I will see her again in a year for further follow-up

## 2024-06-10 NOTE — Telephone Encounter (Signed)
 Dr. Marton Sleeper, patient will be scheduled as soon as possible.  Auth Submission: NO AUTH NEEDED Site of care: Site of care: [facility] Payer: Medicare A/B with BCBS supplement Medication & CPT/J Code(s) submitted: Feraheme (ferumoxytol) [id number] Route of submission (phone, fax, portal):  Phone # Fax # Auth type: Buy/Bill PB Units/visits requested: 510mg  x 2 doses Reference number:  Approval from: 06/10/24 to 10/10/24

## 2024-06-15 ENCOUNTER — Ambulatory Visit

## 2024-06-15 VITALS — BP 135/76 | HR 71 | Temp 98.4°F | Resp 16 | Ht 64.0 in | Wt 190.4 lb

## 2024-06-15 DIAGNOSIS — T454X5A Adverse effect of iron and its compounds, initial encounter: Secondary | ICD-10-CM

## 2024-06-15 DIAGNOSIS — D509 Iron deficiency anemia, unspecified: Secondary | ICD-10-CM | POA: Diagnosis not present

## 2024-06-15 MED ORDER — SODIUM CHLORIDE 0.9 % IV SOLN
510.0000 mg | Freq: Once | INTRAVENOUS | Status: AC
  Administered 2024-06-15: 510 mg via INTRAVENOUS
  Filled 2024-06-15: qty 17

## 2024-06-15 MED ORDER — DIPHENHYDRAMINE HCL 25 MG PO CAPS
25.0000 mg | ORAL_CAPSULE | Freq: Once | ORAL | Status: AC
  Administered 2024-06-15: 25 mg via ORAL
  Filled 2024-06-15: qty 1

## 2024-06-15 MED ORDER — METHYLPREDNISOLONE SODIUM SUCC 125 MG IJ SOLR
125.0000 mg | Freq: Once | INTRAMUSCULAR | Status: AC | PRN
  Administered 2024-06-15: 125 mg via INTRAVENOUS

## 2024-06-15 MED ORDER — SODIUM CHLORIDE 0.9 % IV SOLN: Freq: Once | INTRAVENOUS | Status: DC | PRN

## 2024-06-15 MED ORDER — FAMOTIDINE IN NACL 20-0.9 MG/50ML-% IV SOLN: 20.0000 mg | Freq: Once | INTRAVENOUS | Status: DC | PRN

## 2024-06-15 MED ORDER — ACETAMINOPHEN 325 MG PO TABS
650.0000 mg | ORAL_TABLET | Freq: Once | ORAL | Status: AC
  Administered 2024-06-15: 650 mg via ORAL
  Filled 2024-06-15: qty 2

## 2024-06-15 MED ORDER — EPINEPHRINE 0.3 MG/0.3ML IJ SOAJ: 0.3000 mg | Freq: Once | INTRAMUSCULAR | Status: DC | PRN

## 2024-06-15 MED ORDER — DIPHENHYDRAMINE HCL 50 MG/ML IJ SOLN: 50.0000 mg | Freq: Once | INTRAMUSCULAR | Status: DC | PRN

## 2024-06-15 MED ORDER — ALBUTEROL SULFATE HFA 108 (90 BASE) MCG/ACT IN AERS
2.0000 | INHALATION_SPRAY | Freq: Once | RESPIRATORY_TRACT | Status: AC | PRN
  Administered 2024-06-15: 2 via RESPIRATORY_TRACT

## 2024-06-15 NOTE — Progress Notes (Signed)
 Diagnosis: Iron  Deficiency Anemia  Provider:  Praveen Mannam MD  Procedure: IV Infusion  IV Type: Peripheral, IV Location: R Antecubital  Feraheme (Ferumoxytol), Dose: 510 mg  Infusion Start Time: 1616  Infusion Stop Time: 1632  Post Infusion IV Care: Observation period completed, Peripheral IV Discontinued, and patient observed for additional 30 minutes. See note below.  Discharge: Condition: Stable, Destination: Home . AVS Provided  Performed by:  Lauran Pollard, LPN   At 9604, patient complained of symptoms including shortness of breath and onset of dry cough. Stated she felt it was difficult to take a deep breath. Patient did  take pre-medications prior to infusion. Emergency protocols initiated and emergency medications administered including Solumedrol 125 mg IVP at 1659 and Albuterol  inhaler (2 puffs) at 1652 . Vital signs stable. Lungs clear to auscultation.  Ordering provider Dr. Almeda Jacobs notified via secure staff message (unable to reach provider). Secure message sent to Dr. Phyllis Breeze, MD at 1652. Per provider, ok to administer Albuterol  and Solumedrol IVP, and then monitor patient for additional 30 minutes. After 30 minutes, patient stated that symptoms had resolved; no cough and no shortness of breath. Educated patient to seek care at emergency department if symptoms reappeared; patient verbalized understanding. Patient discharged.  Lendel Quant, RN

## 2024-06-16 ENCOUNTER — Telehealth: Payer: Self-pay | Admitting: *Deleted

## 2024-06-16 NOTE — Telephone Encounter (Signed)
 Per Dr. Marton Sleeper, called to f/u about iv iron  reaction from yesterday. Pt verbalized that she was fine but only a slight headache. She had concerns about future iron  issues that may not be resolved due to not being able to have entire iron  infusion. Advised that per Dr. Marton Sleeper, the dose that she received was large dose. Pt stated for her comfort, she would like to have a lab within 6 months. Scheduled pt for December 2025 labs only. Pt verbalized understanding. Will route message to provider for an FYI.

## 2024-06-21 ENCOUNTER — Encounter: Payer: Self-pay | Admitting: Physician Assistant

## 2024-06-21 ENCOUNTER — Ambulatory Visit (INDEPENDENT_AMBULATORY_CARE_PROVIDER_SITE_OTHER): Admitting: Physician Assistant

## 2024-06-21 VITALS — BP 118/68 | HR 78 | Ht 64.0 in | Wt 183.0 lb

## 2024-06-21 DIAGNOSIS — R1012 Left upper quadrant pain: Secondary | ICD-10-CM | POA: Diagnosis not present

## 2024-06-21 DIAGNOSIS — K635 Polyp of colon: Secondary | ICD-10-CM

## 2024-06-21 DIAGNOSIS — H26493 Other secondary cataract, bilateral: Secondary | ICD-10-CM | POA: Diagnosis not present

## 2024-06-21 DIAGNOSIS — K5909 Other constipation: Secondary | ICD-10-CM | POA: Diagnosis not present

## 2024-06-21 DIAGNOSIS — E119 Type 2 diabetes mellitus without complications: Secondary | ICD-10-CM | POA: Diagnosis not present

## 2024-06-21 DIAGNOSIS — Z860102 Personal history of hyperplastic colon polyps: Secondary | ICD-10-CM | POA: Diagnosis not present

## 2024-06-21 DIAGNOSIS — K5900 Constipation, unspecified: Secondary | ICD-10-CM

## 2024-06-21 DIAGNOSIS — H35363 Drusen (degenerative) of macula, bilateral: Secondary | ICD-10-CM | POA: Diagnosis not present

## 2024-06-21 DIAGNOSIS — H40013 Open angle with borderline findings, low risk, bilateral: Secondary | ICD-10-CM | POA: Diagnosis not present

## 2024-06-21 NOTE — Progress Notes (Signed)
 Chief Complaint: Left upper quadrant pain and constipation  HPI:    Meredith [name] is a 73 year old female with a past medical history as listed below including echocardiogram 04/27/2024 for shortness of breath and CAD with LVEF 65-70% and no aortic stenosis, known to Dr. Shila, who presents to clinic today to discuss left upper quadrant pain and constipation.    04/30/2023 patient seen for worsening right upper quadrant pain.  Had last been seen in December 2021 and at that time had occasional blood in stool after having a bowel movement which was most likely due to hemorrhoids.  At that time discussed right upper quadrant pain that waxed and waned for a year.  At that time recommended abdominal ultrasound and HIDA scan.  Discussed chronic idiopathic constipation using a daily stool softener.    05/03/23 right upper quadrant ultrasound was normal.    06/03/2024 CBC with a hemoglobin of 11 (11.8 on 09/05/2022).  Iron  studies with an iron  percent saturation low at 10%.    06/10/2024 patient seen by oncology for iron  deficiency anemia.  At that time they discussed chronic remittent iron  deficiency anemia had been receiving intermittent IV iron  infusions for 2 years.  Was recommended she have 2 further doses of Feraheme .  Also anemia due to vitamin B12 deficiency.    Today, the patient presents to clinic and tells me she has been having some issues with some left upper quadrant pain that is sharp and seems to come and go at various times.  Also her constipation has gotten worse.  She rarely has a bowel movement but does pass a lot of gas throughout the day which is concerning to her.  Tells me she can go a week without a bowel movement at all.  She then will get the urge to go and feel like she has to run to the bathroom but then will only pass a little bit of stool.  She feels a lot of heaviness in her lower abdomen.  She will take a laxative every now and then either Dulcolax or MiraLAX.    Denies fever, chills  or weight loss.  GI Hx: Colonoscopy 01-23-21 - Diverticulosis in the sigmoid colon, in the descending colon and in the transverse colon.  - One 3 mm polyp in the rectum, removed with a cold snare. Resected and retrieved.  - Non-bleeding internal hemorrhoids. Surgical [P], colon, rectum, polyp - HYPERPLASTIC POLYP. - NO ADENOMATOUS CHANGE OR CARCINOMA. Repeat 10 years   EGD 01-23-21 - Z-line regular, 35 cm from the incisors.  - Non-bleeding gastric ulcer with no stigmata of bleeding.  - Gastritis. Biopsied.  - Erythematous duodenopathy. 1. Surgical [P], gastric - ANTRAL AND OXYNTIC MUCOSA WITH SLIGHT CHRONIC INFLAMMATION. GLENWOOD PHLEGM NEGATIVE FOR HELICOBACTER PYLORI. - NO INTESTINAL METAPLASIA, DYSPLASIA OR CARCINOMA.   EGD and colonoscopy by Dr. Obie in 2015: No source for potential GI blood loss other than gastric erosions in the antrum.  10 mm hyperplastic polyp removed from sigmoid colon   Gastric biopsies showed mild chronic gastritis.  Duodenal biopsies were normal.   She takes aspirin 81 mg daily and also takes NSAIDs intermittently for arthritis Past Medical History:  Diagnosis Date   Anemia    Anxiety state, unspecified    Arthritis    KNEES   B12 deficiency anemia 05/2014 dx   CIN I (cervical intraepithelial neoplasia I)    Depressive disorder, not elsewhere classified    Diabetes mellitus without complication (HCC)    Emphysema  of lung (HCC)    Gallstones    Hypertension    Internal hemorrhoids    Other and unspecified hyperlipidemia    Stomach ulcer    Unspecified sleep apnea     Past Surgical History:  Procedure Laterality Date   cataract surg Right    CERVICAL CERCLAGE     CESAREAN SECTION     COLONOSCOPY     HEMORRHOID SURGERY     KNEE ARTHROSCOPY Left 12/2014   UPPER GASTROINTESTINAL ENDOSCOPY      Current Outpatient Medications  Medication Sig Dispense Refill   ALPRAZolam  (XANAX ) 0.5 MG tablet Take 1 tablet (0.5 mg total) by mouth 2 (two)  times daily as needed for anxiety. 60 tablet 0   amLODipine  (NORVASC ) 2.5 MG tablet TAKE 1 TABLET BY MOUTH EVERY DAY 90 tablet 3   aspirin 81 MG tablet Take 81 mg by mouth daily.     escitalopram  (LEXAPRO ) 20 MG tablet Take 1 tablet (20 mg total) by mouth daily. 90 tablet 3   furosemide  (LASIX ) 20 MG tablet Take 1 tablet (20 mg total) by mouth 2 (two) times daily. 180 tablet 3   irbesartan  (AVAPRO ) 300 MG tablet TAKE 1 TABLET(300 MG) BY MOUTH DAILY 90 tablet 3   linagliptin  (TRADJENTA ) 5 MG TABS tablet Take 1 tablet (5 mg total) by mouth daily. 90 tablet 3   metFORMIN  (GLUCOPHAGE ) 1000 MG tablet TAKE 1 TABLET(1000 MG) BY MOUTH TWICE DAILY WITH A MEAL 180 tablet 0   rosuvastatin  (CRESTOR ) 20 MG tablet Take 1 tablet (20 mg total) by mouth daily. 90 tablet 3   No current facility-administered medications for this visit.    Allergies as of 06/21/2024 - Review Complete 06/10/2024  Allergen Reaction Noted   Feraheme  [ferumoxytol ] Shortness Of Breath and Cough 06/15/2024    Family History  Problem Relation Age of Onset   Multiple sclerosis Mother    Heart attack Father 26   Diabetes Father    Cancer Father        lung   Heart disease Father     Social History   Socioeconomic History   Marital status: Divorced    Spouse name: Not on file   Number of children: 3   Years of education: 14   Highest education level: Not on file  Occupational History   Occupation: Games developer: A [employer]  Tobacco Use   Smoking status: Former    Current packs/day: 1.00    Average packs/day: 1 pack/day for 30.0 years (30.0 ttl pk-yrs)    Types: Cigarettes   Smokeless tobacco: Never  Vaping Use   Vaping status: Never Used  Substance and Sexual Activity   Alcohol use: Yes    Alcohol/week: 6.0 standard drinks of alcohol    Types: 6 Glasses of wine per week    Comment: 6 glasses or wine each week   Drug use: No   Sexual activity: Not Currently    Birth  control/protection: Post-menopausal    Comment: 1st intercourse 73 yo-Fewer than 5 partners  Other Topics Concern   Not on file  Social History Narrative   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 BA- psych from [employer]. Married - 14 years -divorced. 3 daughters - twins 17 - one dtr,Charlotte, died 07/04/12 unknown causes, 46   Work: Artist substance abuse Veterinary surgeon. No h/o physical or sexual abuse   Social Drivers of Corporate investment banker Strain: Low Risk  (  06/28/2022)   Overall Financial Resource Strain (CARDIA)    Difficulty of Paying Living Expenses: Not hard at all  Food Insecurity: No Food Insecurity (06/28/2022)   Hunger Vital Sign    Worried About Running Out of Food in the Last Year: Never true    Ran Out of Food in the Last Year: Never true  Transportation Needs: No Transportation Needs (06/28/2022)   PRAPARE - Administrator, Civil Service (Medical): No    Lack of Transportation (Non-Medical): No  Physical Activity: Insufficiently Active (06/28/2022)   Exercise Vital Sign    Days of Exercise per Week: 7 days    Minutes of Exercise per Session: 20 min  Stress: No Stress Concern Present (06/28/2022)   [employer] of Occupational Health - Occupational Stress Questionnaire    Feeling of Stress : Not at all  Social Connections: Socially Isolated (06/28/2022)   Social Connection and Isolation Panel    Frequency of Communication with Friends and Family: More than three times a week    Frequency of Social Gatherings with Friends and Family: Twice a week    Attends Religious Services: Never    Database administrator or Organizations: No    Attends Banker Meetings: Never    Marital Status: Divorced  Catering manager Violence: Not At Risk (06/28/2022)   Humiliation, Afraid, Rape, and Kick questionnaire    Fear of Current or Ex-Partner: No    Emotionally Abused: No    Physically Abused: No    Sexually Abused: No     Review of Systems:    Constitutional: No weight loss, fever or chills Cardiovascular: No chest pain Respiratory: No SOB  Gastrointestinal: See HPI and otherwise negative   Physical Exam:  Vital signs: BP 118/68   Pulse 78   Ht 5' 4 (1.626 m)   Wt 183 lb (83 kg)   LMP 12/30/1998 (Approximate)   BMI 31.41 kg/m   Constitutional:   Pleasant Caucasian female appears to be in NAD, Well developed, Well nourished, alert and cooperative Respiratory: Respirations even and unlabored. Lungs clear to auscultation bilaterally.   No wheezes, crackles, or rhonchi.  Cardiovascular: Normal S1, S2. No MRG. Regular rate and rhythm. No peripheral edema, cyanosis or pallor.  Gastrointestinal:  Soft, nondistended, nontender. No rebound or guarding. Normal bowel sounds. No appreciable masses or hepatomegaly. Rectal:  Not performed.  Psychiatric: Oriented to person, place and time. Demonstrates good judgement and reason without abnormal affect or behaviors.  RELEVANT LABS AND IMAGING: CBC    Component Value Date/Time   WBC 5.0 06/03/2024 0945   WBC 7.6 04/30/2023 1047   RBC 3.71 (L) 06/03/2024 0946   RBC 3.77 (L) 06/03/2024 0945   HGB 11.0 (L) 06/03/2024 0945   HCT 33.9 (L) 06/03/2024 0945   PLT 224 06/03/2024 0945   MCV 89.9 06/03/2024 0945   MCV 83.5 03/15/2016 1942   MCH 29.2 06/03/2024 0945   MCHC 32.4 06/03/2024 0945   RDW 12.8 06/03/2024 0945   LYMPHSABS 1.2 06/03/2024 0945   MONOABS 0.3 06/03/2024 0945   EOSABS 0.2 06/03/2024 0945   BASOSABS 0.1 06/03/2024 0945    CMP     Component Value Date/Time   NA 140 03/11/2024 1649   K 4.4 03/11/2024 1649   CL 99 03/11/2024 1649   CO2 24 03/11/2024 1649   GLUCOSE 109 (H) 03/11/2024 1649   GLUCOSE 156 (H) 04/30/2023 1047   BUN 16 03/11/2024 1649   CREATININE 0.99 03/11/2024 1649  CREATININE 0.78 11/02/2021 1602   CALCIUM  9.9 03/11/2024 1649   PROT 7.0 04/30/2023 1047   ALBUMIN 4.0 04/30/2023 1047   AST 17 04/30/2023 1047   ALT  12 04/30/2023 1047   ALKPHOS 63 04/30/2023 1047   BILITOT 0.4 04/30/2023 1047   GFRNONAA >60 06/02/2022 2107    Assessment: 1.  Chronic constipation: Worsened lately, not currently on anything daily, likely contributing to below 2.  Left upper quadrant pain: Sharp pain that seems to come and go in her left upper quadrant, related to above; Ackley splenic flexure syndrome/gas pain 3.  History of polyps: Hyperplastic on last exam in 2022, repeat recommended 10 years  Plan: 1.  Recommend the patient start MiraLAX once daily for the next week.  If this does not help her to have more regular bowel movements at least every 2 to 3 days and would increase to twice daily dosing. 2.  If above is not helping and/or the stool consistency is not desirable would recommend adding in a fiber supplement as well. 3.  If the above does not work then would recommend Linzess 72 mcg daily 4.  Patient did ask about a colonoscopy which she is not due yet. 5.  Patient to follow-up in clinic with me in 2 to 3 months or sooner if necessary.  Delon Failing, PA-C [facility] 06/21/2024, 10:33 AM  Cc: Jason Leita Repine,*

## 2024-06-21 NOTE — Patient Instructions (Signed)
 Start Miralax 1 capful daily in 8 ounces of liquid.  Follow up in 3 months or sooner if needed.   _______________________________________________________  If your blood pressure at your visit was 140/90 or greater, please contact your primary care physician to follow up on this.  _______________________________________________________  If you are age 74 or older, your body mass index should be between 23-30. Your Body mass index is 31.41 kg/m. If this is out of the aforementioned range listed, please consider follow up with your Primary Care Provider.  If you are age 54 or younger, your body mass index should be between 19-25. Your Body mass index is 31.41 kg/m. If this is out of the aformentioned range listed, please consider follow up with your Primary Care Provider.   ________________________________________________________  The [facility] providers would like to encourage you to use MYCHART to communicate with providers for non-urgent requests or questions.  Due to long hold times on the telephone, sending your provider a message by [facility] may be a faster and more efficient way to get a response.  Please allow 48 business hours for a response.  Please remember that this is for non-urgent requests.  _______________________________________________________

## 2024-06-22 ENCOUNTER — Ambulatory Visit

## 2024-07-23 ENCOUNTER — Other Ambulatory Visit: Payer: Self-pay | Admitting: Family

## 2024-07-27 DIAGNOSIS — M1712 Unilateral primary osteoarthritis, left knee: Secondary | ICD-10-CM | POA: Diagnosis not present

## 2024-08-03 DIAGNOSIS — M1712 Unilateral primary osteoarthritis, left knee: Secondary | ICD-10-CM | POA: Diagnosis not present

## 2024-08-11 DIAGNOSIS — M1712 Unilateral primary osteoarthritis, left knee: Secondary | ICD-10-CM | POA: Diagnosis not present

## 2024-08-19 DIAGNOSIS — G4733 Obstructive sleep apnea (adult) (pediatric): Secondary | ICD-10-CM | POA: Diagnosis not present

## 2024-08-19 DIAGNOSIS — I2510 Atherosclerotic heart disease of native coronary artery without angina pectoris: Secondary | ICD-10-CM | POA: Diagnosis not present

## 2024-08-19 DIAGNOSIS — J387 Other diseases of larynx: Secondary | ICD-10-CM | POA: Diagnosis not present

## 2024-08-19 DIAGNOSIS — I10 Essential (primary) hypertension: Secondary | ICD-10-CM | POA: Diagnosis not present

## 2024-08-19 DIAGNOSIS — E7800 Pure hypercholesterolemia, unspecified: Secondary | ICD-10-CM | POA: Diagnosis not present

## 2024-08-19 DIAGNOSIS — J383 Other diseases of vocal cords: Secondary | ICD-10-CM | POA: Diagnosis not present

## 2024-08-19 DIAGNOSIS — Z79899 Other long term (current) drug therapy: Secondary | ICD-10-CM | POA: Diagnosis not present

## 2024-08-19 DIAGNOSIS — E119 Type 2 diabetes mellitus without complications: Secondary | ICD-10-CM | POA: Diagnosis not present

## 2024-08-19 DIAGNOSIS — Z7984 Long term (current) use of oral hypoglycemic drugs: Secondary | ICD-10-CM | POA: Diagnosis not present

## 2024-08-27 ENCOUNTER — Ambulatory Visit: Payer: Self-pay | Admitting: *Deleted

## 2024-08-27 NOTE — Telephone Encounter (Signed)
 FYI Only or Action Required?: FYI only for provider.  Patient was last seen in primary care on 10/21/2023 by Jason Leita Repine, FNP.  Called Nurse Triage reporting Skin Problem.  Symptoms began today.  Interventions attempted: Rest, hydration, or home remedies.  Symptoms are: gradually worsening.  Triage Disposition: See Physician Within 24 Hours  Patient/caregiver understands and will follow disposition?: Yes           Copied from CRM #[id number]. Topic: Clinical - Red Word Triage >> Aug 27, 2024 11:12 AM Robinson H wrote: [facility] that prompted transfer to Nurse Triage: Possible infection in belly button, red around the area and wet inside. Reason for Disposition  Wound infection suspected by triager (e.g., other signs of wound infection)  Answer Assessment - Initial Assessment Questions Appt scheduled for Tuesday with PCP. Recommended patient monitor redness , spreading of redness and worsening drainage. Keep clean dry and intact. If  fever noted go to UC. S/p throat surgery last Thursday .       1. LOCATION: Where is the wound located? Around umbilicus , belly button 2. WOUND APPEARANCE: What does the wound look like?      Skin red 3. SIZE: If redness is present, ask: What is the size of the red area? (Inches, centimeters, or compare to size of a coin)      Around belly button  less than 2 inches 4. SPREAD: What's changed in the last day?  Do you see any red streaks coming from the wound?     redness 5. ONSET: When did it start to look infected?      Redness today but noted drainage from belly button couple of months ago  6. MECHANISM: How did the wound start, what was the cause?     Unsure  7. PAIN: Do you have any pain?  If Yes, ask: How bad is the pain?  (e.g., Scale 1-10; mild, moderate, or severe)     No pain  8. FEVER: Do you have a fever? If Yes, ask: What is your temperature, how was it measured, and when did it start?      Denies 9. OTHER SYMPTOMS: Do you have any other symptoms? (e.g., shaking chills, weakness, rash elsewhere on body)     Redness to skin around perimeter of belly button. Clear drainage reported from inside belly button.  10. PREGNANCY: Is there any chance you are pregnant? When was your last menstrual period?       na  Protocols used: Wound Infection Suspected-A-AH

## 2024-08-31 ENCOUNTER — Encounter: Payer: Self-pay | Admitting: Family

## 2024-08-31 ENCOUNTER — Ambulatory Visit (INDEPENDENT_AMBULATORY_CARE_PROVIDER_SITE_OTHER): Admitting: Family

## 2024-08-31 VITALS — BP 116/68 | HR 71 | Temp 97.8°F | Ht 64.0 in | Wt 184.0 lb

## 2024-08-31 DIAGNOSIS — L304 Erythema intertrigo: Secondary | ICD-10-CM | POA: Diagnosis not present

## 2024-08-31 MED ORDER — NYSTATIN-TRIAMCINOLONE 100000-0.1 UNIT/GM-% EX CREA: 1.0000 | TOPICAL_CREAM | Freq: Two times a day (BID) | CUTANEOUS | 0 refills | Status: DC

## 2024-08-31 MED ORDER — DOXYCYCLINE HYCLATE 100 MG PO TABS: 100.0000 mg | ORAL_TABLET | Freq: Two times a day (BID) | ORAL | 0 refills | Status: AC

## 2024-08-31 NOTE — Patient Instructions (Addendum)
 [facility] Address: [address] [place], [place] [address] Phone: [phone]  Boby Mackintosh, Lauraine Pereyra, Corean Pouch

## 2024-08-31 NOTE — Progress Notes (Signed)
 Meredith [name] is a 73 y.o. female with the following history as recorded in [employer]:  Patient Active Problem List   Diagnosis Date Noted   DOE (dyspnea on exertion) 11/11/2021   Iron  deficiency anemia 11/21/2020   Aortic atherosclerosis (HCC) 06/05/2020   Chest pain of uncertain etiology 01/10/2020   Educated about COVID-19 virus infection 01/10/2020   Bilateral primary osteoarthritis of knee 03/19/2019   B12 deficiency anemia    Other emphysema (HCC) 02/23/2014   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HCC) 12/31/2013   Essential hypertension, benign 11/05/2013   Routine health maintenance 09/27/2012   Vitamin D  deficiency 06/20/2011   OBESITY, CLASS II 03/18/2011   Lung nodules 07/12/2010   Diabetes mellitus type 2, controlled, without complications (HCC) 11/02/2007   Hyperlipidemia 10/01/2007   Anxiety 10/01/2007   Depression 10/01/2007   OSA (obstructive sleep apnea) 10/01/2007    Current Outpatient Medications  Medication Sig Dispense Refill   ALPRAZolam  (XANAX ) 0.5 MG tablet Take 1 tablet (0.5 mg total) by mouth 2 (two) times daily as needed for anxiety. 60 tablet 0   amLODipine  (NORVASC ) 2.5 MG tablet TAKE 1 TABLET BY MOUTH EVERY DAY 90 tablet 3   aspirin 81 MG tablet Take 81 mg by mouth daily.     doxycycline  (VIBRA -TABS) 100 MG tablet Take 1 tablet (100 mg total) by mouth 2 (two) times daily for 5 days. 10 tablet 0   escitalopram  (LEXAPRO ) 20 MG tablet Take 1 tablet (20 mg total) by mouth daily. 90 tablet 3   furosemide  (LASIX ) 20 MG tablet Take 1 tablet (20 mg total) by mouth 2 (two) times daily. 180 tablet 3   irbesartan  (AVAPRO ) 300 MG tablet TAKE 1 TABLET(300 MG) BY MOUTH DAILY 90 tablet 3   linagliptin  (TRADJENTA ) 5 MG TABS tablet Take 1 tablet (5 mg total) by mouth daily. 90 tablet 3   metFORMIN  (GLUCOPHAGE ) 1000 MG tablet TAKE 1 TABLET(1000 MG) BY MOUTH TWICE DAILY WITH A MEAL 180 tablet 0   nystatin -triamcinolone  (MYCOLOG II) cream Apply 1 Application topically 2 (two)  times daily. 30 g 0   rosuvastatin  (CRESTOR ) 20 MG tablet Take 1 tablet (20 mg total) by mouth daily. 90 tablet 3   No current facility-administered medications for this visit.    Allergies: Feraheme  [ferumoxytol ]  Past Medical History:  Diagnosis Date   Anemia    Anxiety state, unspecified    Arthritis    KNEES   B12 deficiency anemia 05/2014 dx   CIN I (cervical intraepithelial neoplasia I)    Depressive disorder, not elsewhere classified    Diabetes mellitus without complication (HCC)    Emphysema of lung (HCC)    Gallstones    Hypertension    Internal hemorrhoids    Other and unspecified hyperlipidemia    Stomach ulcer    Unspecified sleep apnea     Past Surgical History:  Procedure Laterality Date   cataract surg Right    CERVICAL CERCLAGE     CESAREAN SECTION     COLONOSCOPY     HEMORRHOID SURGERY     KNEE ARTHROSCOPY Left 12/2014   UPPER GASTROINTESTINAL ENDOSCOPY      Family History  Problem Relation Age of Onset   Multiple sclerosis Mother    Heart attack Father 38   Diabetes Father    Cancer Father        lung   Heart disease Father     Social History   Tobacco Use   Smoking status: Former  Current packs/day: 1.00    Average packs/day: 1 pack/day for 30.0 years (30.0 ttl pk-yrs)    Types: Cigarettes   Smokeless tobacco: Never  Substance Use Topics   Alcohol use: Yes    Alcohol/week: 6.0 standard drinks of alcohol    Types: 6 Glasses of wine per week    Comment: 6 glasses or wine each week    Subjective:   Concerned about redness in belly button x 1 week; concerned about drainage; no pain, no known injury;    Objective:  Vitals:   08/31/24 1035  BP: 116/68  Pulse: 71  Temp: 97.8 F (36.6 C)  TempSrc: Oral  SpO2: 98%  Weight: 184 lb (83.5 kg)  Height: 5' 4 (1.626 m)    General: Well developed, well nourished, in no acute distress  Skin : Warm and dry. Erythema noted surrounding umbilicus; no warmth or streaking;  Head:  Normocephalic and atraumatic  Eyes: Sclera and conjunctiva clear; pupils round and reactive to light; extraocular movements intact  Ears: External normal; canals clear; tympanic membranes normal  Oropharynx: Pink, supple. No suspicious lesions  Neck: Supple without thyromegaly, adenopathy  Lungs: Respirations unlabored;  Neurologic: Alert and oriented; speech intact; face symmetrical; moves all extremities well; CNII-XII intact without focal deficit   Assessment:  1. Intertrigo     Plan:  Rx for Doxycycine and Mycolog II; will re-check in 2 weeks.   Return in about 2 weeks (around 09/14/2024) for CPE.  No orders of the defined types were placed in this encounter.   Requested Prescriptions   Signed Prescriptions Disp Refills   nystatin -triamcinolone  (MYCOLOG II) cream 30 g 0    Sig: Apply 1 Application topically 2 (two) times daily.   doxycycline  (VIBRA -TABS) 100 MG tablet 10 tablet 0    Sig: Take 1 tablet (100 mg total) by mouth 2 (two) times daily for 5 days.

## 2024-09-01 DIAGNOSIS — R490 Dysphonia: Secondary | ICD-10-CM | POA: Diagnosis not present

## 2024-09-01 DIAGNOSIS — Z87891 Personal history of nicotine dependence: Secondary | ICD-10-CM | POA: Diagnosis not present

## 2024-09-01 DIAGNOSIS — J387 Other diseases of larynx: Secondary | ICD-10-CM | POA: Diagnosis not present

## 2024-09-02 ENCOUNTER — Other Ambulatory Visit: Payer: Self-pay | Admitting: Family

## 2024-09-02 MED ORDER — NYSTATIN 100000 UNIT/GM EX CREA: 1.0000 | TOPICAL_CREAM | Freq: Two times a day (BID) | CUTANEOUS | 0 refills | Status: DC

## 2024-09-02 MED ORDER — TRIAMCINOLONE ACETONIDE 0.1 % EX CREA: 1.0000 | TOPICAL_CREAM | Freq: Two times a day (BID) | CUTANEOUS | 0 refills | Status: DC

## 2024-09-14 ENCOUNTER — Ambulatory Visit: Admitting: Family

## 2024-09-14 VITALS — BP 130/80 | HR 69 | Ht 64.0 in | Wt 186.8 lb

## 2024-09-14 DIAGNOSIS — E119 Type 2 diabetes mellitus without complications: Secondary | ICD-10-CM

## 2024-09-14 DIAGNOSIS — I10 Essential (primary) hypertension: Secondary | ICD-10-CM

## 2024-09-14 DIAGNOSIS — F419 Anxiety disorder, unspecified: Secondary | ICD-10-CM

## 2024-09-14 DIAGNOSIS — E782 Mixed hyperlipidemia: Secondary | ICD-10-CM

## 2024-09-14 DIAGNOSIS — R490 Dysphonia: Secondary | ICD-10-CM

## 2024-09-14 MED ORDER — IRBESARTAN 300 MG PO TABS: 300.0000 mg | ORAL_TABLET | Freq: Every day | ORAL | 3 refills | Status: AC

## 2024-09-14 MED ORDER — ESCITALOPRAM OXALATE 20 MG PO TABS: 20.0000 mg | ORAL_TABLET | Freq: Every day | ORAL | 3 refills | Status: AC

## 2024-09-14 MED ORDER — ALPRAZOLAM 0.5 MG PO TABS: 0.5000 mg | ORAL_TABLET | Freq: Two times a day (BID) | ORAL | 0 refills | Status: AC | PRN

## 2024-09-14 MED ORDER — METFORMIN HCL 1000 MG PO TABS: 1000.0000 mg | ORAL_TABLET | Freq: Two times a day (BID) | ORAL | 1 refills | Status: AC

## 2024-09-14 MED ORDER — AMLODIPINE BESYLATE 2.5 MG PO TABS: ORAL_TABLET | ORAL | 3 refills | Status: AC

## 2024-09-14 MED ORDER — FUROSEMIDE 20 MG PO TABS: 20.0000 mg | ORAL_TABLET | Freq: Two times a day (BID) | ORAL | 1 refills | Status: AC

## 2024-09-14 MED ORDER — LINAGLIPTIN 5 MG PO TABS: 5.0000 mg | ORAL_TABLET | Freq: Every day | ORAL | 3 refills | Status: AC

## 2024-09-16 ENCOUNTER — Other Ambulatory Visit: Payer: Self-pay | Admitting: Family

## 2024-09-16 DIAGNOSIS — Z1231 Encounter for screening mammogram for malignant neoplasm of breast: Secondary | ICD-10-CM

## 2024-09-16 NOTE — Progress Notes (Signed)
 Meredith [name] is a 73 y.o. female with the following history as recorded in Epic[facility]:  Patient Active Problem List   Diagnosis Date Noted   DOE (dyspnea on exertion) 11/11/2021   Iron  deficiency anemia 11/21/2020   Aortic atherosclerosis (HCC) 06/05/2020   Chest pain of uncertain etiology 01/10/2020   Educated about COVID-19 virus infection 01/10/2020   Bilateral primary osteoarthritis of knee 03/19/2019   B12 deficiency anemia    Other emphysema (HCC) 02/23/2014   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HCC) 12/31/2013   Essential hypertension, benign 11/05/2013   Routine health maintenance 09/27/2012   Vitamin D  deficiency 06/20/2011   OBESITY, CLASS II 03/18/2011   Lung nodules 07/12/2010   Diabetes mellitus type 2, controlled, without complications (HCC) 11/02/2007   Hyperlipidemia 10/01/2007   Anxiety 10/01/2007   Depression 10/01/2007   OSA (obstructive sleep apnea) 10/01/2007    Current Outpatient Medications  Medication Sig Dispense Refill   aspirin 81 MG tablet Take 81 mg by mouth daily.     nystatin  cream (MYCOSTATIN ) Apply 1 Application topically 2 (two) times daily. 30 g 0   rosuvastatin  (CRESTOR ) 20 MG tablet Take 1 tablet (20 mg total) by mouth daily. 90 tablet 3   triamcinolone  cream (KENALOG ) 0.1 % Apply 1 Application topically 2 (two) times daily. 30 g 0   ALPRAZolam  (XANAX ) 0.5 MG tablet Take 1 tablet (0.5 mg total) by mouth 2 (two) times daily as needed for anxiety. 60 tablet 0   amLODipine  (NORVASC ) 2.5 MG tablet TAKE 1 TABLET BY MOUTH EVERY DAY 90 tablet 3   escitalopram  (LEXAPRO ) 20 MG tablet Take 1 tablet (20 mg total) by mouth daily. 90 tablet 3   furosemide  (LASIX ) 20 MG tablet Take 1 tablet (20 mg total) by mouth 2 (two) times daily. 180 tablet 1   irbesartan  (AVAPRO ) 300 MG tablet Take 1 tablet (300 mg total) by mouth daily. 90 tablet 3   linagliptin  (TRADJENTA ) 5 MG TABS tablet Take 1 tablet (5 mg total) by mouth daily. 90 tablet 3   metFORMIN  (GLUCOPHAGE )  1000 MG tablet Take 1 tablet (1,000 mg total) by mouth 2 (two) times daily with a meal. 180 tablet 1   nystatin -triamcinolone  (MYCOLOG II) cream Apply 1 Application topically 2 (two) times daily. (Patient not taking: Reported on 09/14/2024) 30 g 0   No current facility-administered medications for this visit.    Allergies: Feraheme  [ferumoxytol ]  Past Medical History:  Diagnosis Date   Anemia    Anxiety state, unspecified    Arthritis    KNEES   B12 deficiency anemia 05/2014 dx   CIN I (cervical intraepithelial neoplasia I)    Depressive disorder, not elsewhere classified    Diabetes mellitus without complication (HCC)    Emphysema of lung (HCC)    Gallstones    Hypertension    Internal hemorrhoids    Other and unspecified hyperlipidemia    Stomach ulcer    Unspecified sleep apnea     Past Surgical History:  Procedure Laterality Date   cataract surg Right    CERVICAL CERCLAGE     CESAREAN SECTION     COLONOSCOPY     HEMORRHOID SURGERY     KNEE ARTHROSCOPY Left 12/2014   UPPER GASTROINTESTINAL ENDOSCOPY      Family History  Problem Relation Age of Onset   Multiple sclerosis Mother    Heart attack Father 22   Diabetes Father    Cancer Father        lung  Heart disease Father     Social History   Tobacco Use   Smoking status: Former    Current packs/day: 1.00    Average packs/day: 1 pack/day for 30.0 years (30.0 ttl pk-yrs)    Types: Cigarettes   Smokeless tobacco: Never  Substance Use Topics   Alcohol use: Yes    Alcohol/week: 6.0 standard drinks of alcohol    Types: 6 Glasses of wine per week    Comment: 6 glasses or wine each week    Subjective:   Follow up on chronic care needs- no acute concerns; does need refills updated; is aware that provider is leaving and will need to get new PCP; Denies any chest pain, shortness of breath, blurred vision or headache Continuing to work with her specialists who are managing majority of healthcare needs;     Objective:  Vitals:   09/14/24 1550  BP: 130/80  Pulse: 69  SpO2: 100%  Weight: 186 lb 12.8 oz (84.7 kg)  Height: 5' 4 (1.626 m)    General: Well developed, well nourished, in no acute distress  Skin : Warm and dry.  Head: Normocephalic and atraumatic  Eyes: Sclera and conjunctiva clear; pupils round and reactive to light; extraocular movements intact  Ears: External normal; canals clear; tympanic membranes normal  Oropharynx: Pink, supple. No suspicious lesions  Neck: Supple without thyromegaly, adenopathy  Lungs: Respirations unlabored; clear to auscultation bilaterally without wheeze, rales, rhonchi  CVS exam: normal rate and regular rhythm.  Neurologic: Alert and oriented; speech intact; face symmetrical; moves all extremities well; CNII-XII intact without focal deficit   Assessment:  1. Anxiety   2. Benign hypertension   3. Mixed hyperlipidemia   4. Controlled type 2 diabetes mellitus without complication, without long-term current use of insulin (HCC)   5. Chronic hoarseness     Plan:  Stable; refill  updated; Stable; refill updated; Stable; refill updated; Stable- she will discuss possible GLP-1 start with her new PCP;  Under care of ENT;   No follow-ups on file.  No orders of the defined types were placed in this encounter.   Requested Prescriptions   Signed Prescriptions Disp Refills   amLODipine  (NORVASC ) 2.5 MG tablet 90 tablet 3    Sig: TAKE 1 TABLET BY MOUTH EVERY DAY   escitalopram  (LEXAPRO ) 20 MG tablet 90 tablet 3    Sig: Take 1 tablet (20 mg total) by mouth daily.   irbesartan  (AVAPRO ) 300 MG tablet 90 tablet 3    Sig: Take 1 tablet (300 mg total) by mouth daily.   linagliptin  (TRADJENTA ) 5 MG TABS tablet 90 tablet 3    Sig: Take 1 tablet (5 mg total) by mouth daily.   metFORMIN  (GLUCOPHAGE ) 1000 MG tablet 180 tablet 1    Sig: Take 1 tablet (1,000 mg total) by mouth 2 (two) times daily with a meal.   furosemide  (LASIX ) 20 MG tablet 180 tablet  1    Sig: Take 1 tablet (20 mg total) by mouth 2 (two) times daily.   ALPRAZolam  (XANAX ) 0.5 MG tablet 60 tablet 0    Sig: Take 1 tablet (0.5 mg total) by mouth 2 (two) times daily as needed for anxiety.

## 2024-10-26 ENCOUNTER — Other Ambulatory Visit: Payer: Self-pay | Admitting: Medical Genetics

## 2024-11-01 ENCOUNTER — Inpatient Hospital Stay: Admission: RE | Admit: 2024-11-01 | Source: Ambulatory Visit

## 2024-11-04 DIAGNOSIS — Z23 Encounter for immunization: Secondary | ICD-10-CM | POA: Diagnosis not present

## 2024-11-18 ENCOUNTER — Ambulatory Visit

## 2024-11-22 ENCOUNTER — Other Ambulatory Visit

## 2024-11-29 ENCOUNTER — Inpatient Hospital Stay: Attending: Hematology and Oncology

## 2024-11-29 ENCOUNTER — Other Ambulatory Visit: Payer: Self-pay

## 2024-11-29 ENCOUNTER — Telehealth: Payer: Self-pay

## 2024-11-29 DIAGNOSIS — Z79899 Other long term (current) drug therapy: Secondary | ICD-10-CM | POA: Insufficient documentation

## 2024-11-29 DIAGNOSIS — D509 Iron deficiency anemia, unspecified: Secondary | ICD-10-CM | POA: Insufficient documentation

## 2024-11-29 DIAGNOSIS — Z7982 Long term (current) use of aspirin: Secondary | ICD-10-CM | POA: Insufficient documentation

## 2024-11-29 DIAGNOSIS — E119 Type 2 diabetes mellitus without complications: Secondary | ICD-10-CM

## 2024-11-29 DIAGNOSIS — D519 Vitamin B12 deficiency anemia, unspecified: Secondary | ICD-10-CM

## 2024-11-29 LAB — CBC WITH DIFFERENTIAL (CANCER CENTER ONLY)
Abs Immature Granulocytes: 0.01 K/uL (ref 0.00–0.07)
Basophils Absolute: 0.1 K/uL (ref 0.0–0.1)
Basophils Relative: 2 %
Eosinophils Absolute: 0.2 K/uL (ref 0.0–0.5)
Eosinophils Relative: 4 %
HCT: 37.4 % (ref 36.0–46.0)
Hemoglobin: 12.4 g/dL (ref 12.0–15.0)
Immature Granulocytes: 0 %
Lymphocytes Relative: 21 %
Lymphs Abs: 1.3 K/uL (ref 0.7–4.0)
MCH: 30.1 pg (ref 26.0–34.0)
MCHC: 33.2 g/dL (ref 30.0–36.0)
MCV: 90.8 fL (ref 80.0–100.0)
Monocytes Absolute: 0.4 K/uL (ref 0.1–1.0)
Monocytes Relative: 6 %
Neutro Abs: 4.2 K/uL (ref 1.7–7.7)
Neutrophils Relative %: 67 %
Platelet Count: 253 K/uL (ref 150–400)
RBC: 4.12 MIL/uL (ref 3.87–5.11)
RDW: 12.8 % (ref 11.5–15.5)
WBC Count: 6.2 K/uL (ref 4.0–10.5)
nRBC: 0 % (ref 0.0–0.2)

## 2024-11-29 LAB — IRON AND IRON BINDING CAPACITY (CC-WL,HP ONLY)
Iron: 49 ug/dL (ref 28–170)
Saturation Ratios: 14 % (ref 10.4–31.8)
TIBC: 364 ug/dL (ref 250–450)
UIBC: 315 ug/dL

## 2024-11-29 LAB — VITAMIN B12: Vitamin B-12: 476 pg/mL (ref 180–914)

## 2024-11-29 LAB — FERRITIN: Ferritin: 79 ng/mL (ref 11–307)

## 2024-11-29 LAB — SEDIMENTATION RATE: Sed Rate: 44 mm/h — ABNORMAL HIGH (ref 0–22)

## 2024-11-29 NOTE — Telephone Encounter (Signed)
 Called and scheduled appt. She is aware of appt.

## 2024-11-29 NOTE — Telephone Encounter (Signed)
-----   Message from Meredith [name] sent at 11/29/2024  1:08 PM EST ----- For some reason she does not have appt to see me Can you schedule for Friday see me at 220 pm?

## 2024-11-30 LAB — HEMOGLOBIN A1C
Hgb A1c MFr Bld: 5.7 % — ABNORMAL HIGH (ref 4.8–5.6)
Mean Plasma Glucose: 117 mg/dL

## 2024-12-03 ENCOUNTER — Inpatient Hospital Stay: Admitting: Hematology and Oncology

## 2024-12-03 ENCOUNTER — Encounter: Payer: Self-pay | Admitting: Hematology and Oncology

## 2024-12-03 VITALS — BP 133/64 | HR 86 | Temp 97.3°F | Resp 18 | Ht 64.0 in | Wt 180.9 lb

## 2024-12-03 DIAGNOSIS — D509 Iron deficiency anemia, unspecified: Secondary | ICD-10-CM

## 2024-12-03 NOTE — Progress Notes (Signed)
   [facility] OFFICE PROGRESS NOTE  Jason Leita Repine, FNP (Inactive)  ASSESSMENT & PLAN:  Assessment & Plan Iron  deficiency anemia, unspecified iron  deficiency anemia type The patient has chronic remittent iron  deficiency anemia and has been receiving intermittent IV iron  for the last 2 years Her last intravenous iron  was in June 2025, complicated by infusion reaction to iron  Feraheme  We reviewed test results Repeat CBC and iron  studies are adequate I will see her again in 6 months for further follow-up    Orders Placed This Encounter  Procedures   CBC with Differential ([facility]cer Center Only)    Standing Status:   Future    Expiration Date:   12/03/2025   Iron  and Iron  Binding Capacity ([facility] only)    Standing Status:   Future    Expiration Date:   12/03/2025   Ferritin    Standing Status:   Future    Expiration Date:   12/03/2025    INTERVAL HISTORY: Patient returns for recurrent anemia Symptoms of anemia includes none We reviewed CBC, iron  studies, vitamin B12 and hemoglobin A1c  SUMMARY OF HEMATOLOGIC HISTORY:  She was found to have abnormal CBC from recent blood work I have the opportunity to review her CBC dated back to June 2015 Hemoglobin ranged from 10.3-11.9 Her most recent blood work show evidence of severe iron  deficiency She had EGD and colonoscopy performed in 2015 which showed no evidence of GI bleed She denies recent chest pain on exertion, but does complain of shortness of breath on minimal exertion and excessive fatigue. She had not noticed any recent bleeding such as epistaxis but did states she has occasional hemorrhoidal bleeding once a month She had taken NSAID in the past for chronic joint pain but nothing recently.  She is on chronic aspirin therapy  She had history of abnormal PAP smear but no prior history of cancer. Her age appropriate screening programs are up-to-date. She eats a variety of diet She used to donate blood a  lot in the past but nothing in the last 20 years She has pica and craves ice The patient was prescribed oral iron  supplements and she takes in the past but that cause significant constipation Between December 2021 to 2023 she received multiple doses of IV iron ; she received her final dose of IV iron  in September 2023 She had infusion reaction to iron  Feraheme  in June 2025  Lab Results  Component Value Date   VITAMINB12 476 11/29/2024   FERRITIN 79 11/29/2024   HGB 12.4 11/29/2024   RBC 4.12 11/29/2024   Vitals:   12/03/24 1447  BP: 133/64  Pulse: 86  Resp: 18  Temp: (!) 97.3 F (36.3 C)  SpO2: 98%

## 2024-12-03 NOTE — Assessment & Plan Note (Addendum)
 The patient has chronic remittent iron  deficiency anemia and has been receiving intermittent IV iron  for the last 2 years Her last intravenous iron  was in June 2025, complicated by infusion reaction to iron  Feraheme  We reviewed test results Repeat CBC and iron  studies are adequate I will see her again in 6 months for further follow-up

## 2024-12-08 ENCOUNTER — Encounter: Payer: Self-pay | Admitting: Family Medicine

## 2024-12-08 ENCOUNTER — Ambulatory Visit: Admitting: Family Medicine

## 2024-12-08 VITALS — BP 128/78 | HR 83 | Temp 98.4°F | Ht 64.0 in | Wt 183.0 lb

## 2024-12-08 DIAGNOSIS — J438 Other emphysema: Secondary | ICD-10-CM

## 2024-12-08 DIAGNOSIS — I700 Atherosclerosis of aorta: Secondary | ICD-10-CM

## 2024-12-08 DIAGNOSIS — Z7984 Long term (current) use of oral hypoglycemic drugs: Secondary | ICD-10-CM

## 2024-12-08 DIAGNOSIS — E119 Type 2 diabetes mellitus without complications: Secondary | ICD-10-CM

## 2024-12-08 DIAGNOSIS — I10 Essential (primary) hypertension: Secondary | ICD-10-CM | POA: Diagnosis not present

## 2024-12-08 DIAGNOSIS — J029 Acute pharyngitis, unspecified: Secondary | ICD-10-CM | POA: Insufficient documentation

## 2024-12-08 DIAGNOSIS — G4733 Obstructive sleep apnea (adult) (pediatric): Secondary | ICD-10-CM

## 2024-12-08 DIAGNOSIS — F3341 Major depressive disorder, recurrent, in partial remission: Secondary | ICD-10-CM

## 2024-12-08 DIAGNOSIS — M1712 Unilateral primary osteoarthritis, left knee: Secondary | ICD-10-CM | POA: Diagnosis not present

## 2024-12-08 DIAGNOSIS — D509 Iron deficiency anemia, unspecified: Secondary | ICD-10-CM

## 2024-12-08 DIAGNOSIS — E782 Mixed hyperlipidemia: Secondary | ICD-10-CM

## 2024-12-08 NOTE — Patient Instructions (Signed)
 Thank you for trusting us  with your health care.  Please follow-up in 2 to 3 months for as needed.

## 2024-12-08 NOTE — Progress Notes (Signed)
 New Patient Office Visit  Subjective    Patient ID: Meredith [name], female    DOB: Nov 06, 1951  Age: 73 y.o. MRN: [id number]  CC:  Chief Complaint  Patient presents with   Establish Care    Discuss lexapro      Discussed the use of AI scribe software for clinical note transcription with the patient, who gave verbal consent to proceed.  History of Present Illness Meredith [name] is a 73 year old female who presents to establish care.  Glycemic control - Type 2 diabetes mellitus treated with metformin  and Tradjenta  - Blood glucose levels regularly monitored - Last blood sugar check in December 2024 was near normal  Anemia and nutritional deficiency - Followed by oncology for anemia - Receives iron  infusions; allergic reaction to a new infusion medication in June 2025, subsequent infusion canceled - Oral B12 supplementation for B12 deficiency  Hypertension and hyperlipidemia - Hypertension managed with amlodipine , irbesartan , and Lasix  - Hyperlipidemia treated with rosuvastatin   Sleep apnea - Obstructive sleep apnea managed with CPAP therapy  Pulmonary disease - Former smoker with emphysema - No current respiratory symptoms  Osteoarthritis and musculoskeletal pain - Osteoarthritis primarily affecting the left knee - Receives gel injections every six months - History of arthroscopic surgery  - under the care of orthopedist   Mood disorders - Anxiety and depression - Discontinued Lexapro  2 to 3 weeks ago, with subsequent emotional episodes - Uses alprazolam  as needed, prescribed 90 tablets per year and uses sparingly     Outpatient Encounter Medications as of 12/08/2024  Medication Sig   ALPRAZolam  (XANAX ) 0.5 MG tablet Take 1 tablet (0.5 mg total) by mouth 2 (two) times daily as needed for anxiety.   amLODipine  (NORVASC ) 2.5 MG tablet TAKE 1 TABLET BY MOUTH EVERY DAY   aspirin 81 MG tablet Take 81 mg by mouth daily.   furosemide  (LASIX ) 20 MG tablet Take 1 tablet (20 mg  total) by mouth 2 (two) times daily. (Patient taking differently: Take 20 mg by mouth 2 (two) times daily. Two tablets every other day and 1 tablet in between)   irbesartan  (AVAPRO ) 300 MG tablet Take 1 tablet (300 mg total) by mouth daily.   linagliptin  (TRADJENTA ) 5 MG TABS tablet Take 1 tablet (5 mg total) by mouth daily.   metFORMIN  (GLUCOPHAGE ) 1000 MG tablet Take 1 tablet (1,000 mg total) by mouth 2 (two) times daily with a meal.   rosuvastatin  (CRESTOR ) 20 MG tablet Take 1 tablet (20 mg total) by mouth daily.   escitalopram  (LEXAPRO ) 20 MG tablet Take 1 tablet (20 mg total) by mouth daily. (Patient not taking: Reported on 12/08/2024)   [DISCONTINUED] nystatin  cream (MYCOSTATIN ) Apply 1 Application topically 2 (two) times daily. (Patient not taking: Reported on 12/08/2024)   [DISCONTINUED] nystatin -triamcinolone  (MYCOLOG II) cream Apply 1 Application topically 2 (two) times daily. (Patient not taking: Reported on 12/08/2024)   [DISCONTINUED] triamcinolone  cream (KENALOG ) 0.1 % Apply 1 Application topically 2 (two) times daily. (Patient not taking: Reported on 12/08/2024)   No facility-administered encounter medications on file as of 12/08/2024.    Past Medical History:  Diagnosis Date   Acute pharyngitis 12/08/2024   Anemia    Anxiety state, unspecified    Arthritis    KNEES   B12 deficiency anemia 05/2014 dx   CIN I (cervical intraepithelial neoplasia I)    Depressive disorder, not elsewhere classified    Diabetes mellitus without complication (HCC)    Dysphonia 05/13/2024   Emphysema of  lung (HCC)    Gallstones    Hypertension    Internal hemorrhoids    Other and unspecified hyperlipidemia    Stomach ulcer    Unspecified sleep apnea     Past Surgical History:  Procedure Laterality Date   cataract surg Right    CERVICAL CERCLAGE     CESAREAN SECTION     COLONOSCOPY     HEMORRHOID SURGERY     KNEE ARTHROSCOPY Left 12/2014   UPPER GASTROINTESTINAL ENDOSCOPY       Family History  Problem Relation Age of Onset   Multiple sclerosis Mother    Heart attack Father 56   Diabetes Father    Cancer Father        lung   Heart disease Father     Social History   Socioeconomic History   Marital status: Divorced    Spouse name: Not on file   Number of children: 3   Years of education: 14   Highest education level: Bachelor's degree (e.g., BA, AB, BS)  Occupational History   Occupation: Counselor/therapist    Employer: [employer]  Tobacco Use   Smoking status: Former    Current packs/day: 1.00    Average packs/day: 1 pack/day for 30.0 years (30.0 ttl pk-yrs)    Types: Cigarettes   Smokeless tobacco: Never  Vaping Use   Vaping status: Never Used  Substance and Sexual Activity   Alcohol use: Yes    Alcohol/week: 6.0 standard drinks of alcohol    Types: 6 Glasses of wine per week    Comment: 6 glasses or wine each week   Drug use: No   Sexual activity: Not Currently    Birth control/protection: Post-menopausal    Comment: 1st intercourse 73 yo-Fewer than 5 partners  Other Topics Concern   Not on file  Social History Narrative   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 BA- psych from [employer] Married - 14 years -divorced. 3 daughters - twins 68 - one dtr,Charlotte, died 01-07-13 unknown causes, 3   Work: artist substance abuse veterinary surgeon. No h/o physical or sexual abuse   Social Drivers of Corporate Investment Banker Strain: Low Risk  (09/14/2024)   Overall Financial Resource Strain (CARDIA)    Difficulty of Paying Living Expenses: Not hard at all  Food Insecurity: No Food Insecurity (09/14/2024)   Hunger Vital Sign    Worried About Running Out of Food in the Last Year: Never true    Ran Out of Food in the Last Year: Never true  Transportation Needs: No Transportation Needs (09/14/2024)   PRAPARE - Administrator, Civil Service (Medical): No    Lack of Transportation  (Non-Medical): No  Physical Activity: Inactive (09/14/2024)   Exercise Vital Sign    Days of Exercise per Week: 0 days    Minutes of Exercise per Session: Not on file  Stress: Stress Concern Present (09/14/2024)   [employer] of Occupational Health - Occupational Stress Questionnaire    Feeling of Stress: Rather much  Social Connections: Moderately Isolated (09/14/2024)   Social Connection and Isolation Panel    Frequency of Communication with Friends and Family: More than three times a week    Frequency of Social Gatherings with Friends and Family: Once a week    Attends Religious Services: 1 to 4 times per year    Active Member of [employer] or Organizations: No    Attends Banker Meetings:  Not on file    Marital Status: Divorced  Intimate Partner Violence: Not At Risk (06/28/2022)   Humiliation, Afraid, Rape, and Kick questionnaire    Fear of Current or Ex-Partner: No    Emotionally Abused: No    Physically Abused: No    Sexually Abused: No    ROS Per HPI      Objective    BP 128/78 (BP Location: Left Arm, Patient Position: Sitting)   Pulse 83   Temp 98.4 F (36.9 C) (Temporal)   Ht 5' 4 (1.626 m)   Wt 183 lb (83 kg)   LMP 12/30/1998 (Approximate)   SpO2 98%   BMI 31.41 kg/m   Physical Exam Constitutional:      General: She is not in acute distress.    Appearance: She is not ill-appearing.  Eyes:     Extraocular Movements: Extraocular movements intact.     Conjunctiva/sclera: Conjunctivae normal.  Cardiovascular:     Rate and Rhythm: Normal rate.  Pulmonary:     Effort: Pulmonary effort is normal.  Musculoskeletal:     Cervical back: Normal range of motion and neck supple.  Skin:    General: Skin is warm and dry.  Neurological:     General: No focal deficit present.     Mental Status: She is alert and oriented to person, place, and time.  Psychiatric:        Mood and Affect: Mood normal.        Behavior: Behavior normal.        Thought  Content: Thought content normal.         Assessment & Plan:   Problem List Items Addressed This Visit     Aortic atherosclerosis   Depression   Diabetes mellitus type 2, controlled, without complications (HCC)   Essential hypertension, benign - Primary   Hyperlipidemia   Iron  deficiency anemia   OSA (obstructive sleep apnea)   Osteoarthritis of left knee   Other emphysema (HCC)    Assessment and Plan Assessment & Plan Primary osteoarthritis of left knee Chronic osteoarthritis of the left knee with worsening pain, exacerbated by cold weather and extending down the leg. Previous arthroscopic surgeries and gel injections have been performed. Considering knee replacement but currently managing with gel injections. - Continue gel injections as scheduled. - Consult orthopedist if symptoms worsen or for further evaluation of knee replacement.  Type 2 diabetes mellitus Type 2 diabetes mellitus, previously diagnosed as prediabetes. Currently managed with metformin  and Tradjenta . Blood sugar levels are being monitored regularly. - Continue metformin  and Tradjenta . - Continue regular blood sugar monitoring.  Iron  deficiency anemia Managed with B12 infusions. Recent allergic reaction to a new medication during infusion, leading to cancellation of subsequent infusion. - Continue follow up with heme/onc  Essential hypertension Essential hypertension, currently well-controlled with amlodipine  and irbesartan . Blood pressure today is 128/78 mmHg. - Continue amlodipine  and irbesartan . - Monitor blood pressure at home for 10 days as advised by cardiologist.  Mixed hyperlipidemia Managed with rosuvastatin . - Continue rosuvastatin .  Obstructive sleep apnea Managed with CPAP therapy. - Continue CPAP therapy.  Other emphysema Emphysema, likely secondary to former smoking history. No current respiratory issues or need for inhalers. Oxygen saturation remains stable.  Major depressive  disorder, recurrent, in partial remission Major depressive disorder, recurrent, in partial remission. Recently discontinued Lexapro  due to prescription lapse. Experiencing emotional distress, considering restarting medication. - Restart Lexapro  and monitor response. - Report progress via MyChart after 4 weeks of restarting medication.  General health maintenance Received flu shot, hesitant about COVID booster. Considering turmeric supplementation. - Consider COVID booster vaccination.      Return in about 3 months (around 03/08/2025) for chronic health conditions.   Boby Mackintosh, NP-C

## 2024-12-15 ENCOUNTER — Other Ambulatory Visit: Payer: Self-pay | Admitting: Family Medicine

## 2024-12-15 ENCOUNTER — Inpatient Hospital Stay: Admission: RE | Admit: 2024-12-15 | Discharge: 2024-12-15 | Attending: Family | Admitting: Family

## 2024-12-15 DIAGNOSIS — Z1231 Encounter for screening mammogram for malignant neoplasm of breast: Secondary | ICD-10-CM

## 2024-12-15 LAB — GENECONNECT MOLECULAR SCREEN

## 2025-02-01 ENCOUNTER — Encounter: Admitting: Family Medicine

## 2025-03-08 ENCOUNTER — Ambulatory Visit: Admitting: Family Medicine

## 2025-06-03 ENCOUNTER — Other Ambulatory Visit

## 2025-06-03 ENCOUNTER — Inpatient Hospital Stay

## 2025-06-10 ENCOUNTER — Ambulatory Visit: Admitting: Hematology and Oncology

## 2025-06-10 ENCOUNTER — Inpatient Hospital Stay: Admitting: Hematology and Oncology

## 2025-06-13 ENCOUNTER — Encounter: Admitting: Obstetrics and Gynecology
